# Patient Record
Sex: Female | Born: 1980 | Race: Black or African American | Hispanic: No | Marital: Married | Smoking: Never smoker
Health system: Southern US, Community
[De-identification: ages and names within clinical notes are randomized; demographics above are authoritative.]

## PROBLEM LIST (undated history)

## (undated) DIAGNOSIS — E538 Deficiency of other specified B group vitamins: Secondary | ICD-10-CM

## (undated) DIAGNOSIS — D219 Benign neoplasm of connective and other soft tissue, unspecified: Secondary | ICD-10-CM

## (undated) DIAGNOSIS — Z9889 Other specified postprocedural states: Secondary | ICD-10-CM

## (undated) DIAGNOSIS — I201 Angina pectoris with documented spasm: Secondary | ICD-10-CM

## (undated) DIAGNOSIS — E559 Vitamin D deficiency, unspecified: Secondary | ICD-10-CM

## (undated) DIAGNOSIS — F909 Attention-deficit hyperactivity disorder, unspecified type: Secondary | ICD-10-CM

## (undated) DIAGNOSIS — K589 Irritable bowel syndrome without diarrhea: Secondary | ICD-10-CM

## (undated) DIAGNOSIS — K8020 Calculus of gallbladder without cholecystitis without obstruction: Secondary | ICD-10-CM

## (undated) DIAGNOSIS — Q245 Malformation of coronary vessels: Secondary | ICD-10-CM

## (undated) DIAGNOSIS — E739 Lactose intolerance, unspecified: Secondary | ICD-10-CM

## (undated) DIAGNOSIS — E669 Obesity, unspecified: Secondary | ICD-10-CM

## (undated) DIAGNOSIS — D649 Anemia, unspecified: Secondary | ICD-10-CM

## (undated) DIAGNOSIS — M549 Dorsalgia, unspecified: Secondary | ICD-10-CM

## (undated) DIAGNOSIS — M2550 Pain in unspecified joint: Secondary | ICD-10-CM

## (undated) DIAGNOSIS — R112 Nausea with vomiting, unspecified: Secondary | ICD-10-CM

## (undated) DIAGNOSIS — K635 Polyp of colon: Secondary | ICD-10-CM

## (undated) HISTORY — DX: Pain in unspecified joint: M25.50

## (undated) HISTORY — DX: Anemia, unspecified: D64.9

## (undated) HISTORY — DX: Malformation of coronary vessels: Q24.5

## (undated) HISTORY — DX: Irritable bowel syndrome, unspecified: K58.9

## (undated) HISTORY — DX: Vitamin D deficiency, unspecified: E55.9

## (undated) HISTORY — PX: COLONOSCOPY: SHX174

## (undated) HISTORY — DX: Lactose intolerance, unspecified: E73.9

## (undated) HISTORY — DX: Obesity, unspecified: E66.9

## (undated) HISTORY — DX: Polyp of colon: K63.5

## (undated) HISTORY — PX: COLONOSCOPY W/ POLYPECTOMY: SHX1380

## (undated) HISTORY — PX: LAPAROSCOPIC DECORTICATION / DUBULKING / ABLATION RENAL CYSTS: SUR760

## (undated) HISTORY — DX: Dorsalgia, unspecified: M54.9

## (undated) HISTORY — DX: Deficiency of other specified B group vitamins: E53.8

## (undated) HISTORY — DX: Calculus of gallbladder without cholecystitis without obstruction: K80.20

## (undated) HISTORY — PX: WISDOM TOOTH EXTRACTION: SHX21

---

## 1999-11-03 ENCOUNTER — Encounter: Admission: RE | Admit: 1999-11-03 | Discharge: 2000-02-01 | Payer: Self-pay | Admitting: Family Medicine

## 2000-12-07 ENCOUNTER — Emergency Department (HOSPITAL_COMMUNITY): Admission: EM | Admit: 2000-12-07 | Discharge: 2000-12-08 | Payer: Self-pay | Admitting: Emergency Medicine

## 2001-11-13 HISTORY — PX: COLONOSCOPY: SHX174

## 2001-11-21 ENCOUNTER — Encounter: Payer: Self-pay | Admitting: Gastroenterology

## 2001-11-21 DIAGNOSIS — D126 Benign neoplasm of colon, unspecified: Secondary | ICD-10-CM

## 2002-08-12 ENCOUNTER — Other Ambulatory Visit: Admission: RE | Admit: 2002-08-12 | Discharge: 2002-08-12 | Payer: Self-pay | Admitting: Family Medicine

## 2003-01-03 ENCOUNTER — Emergency Department (HOSPITAL_COMMUNITY): Admission: EM | Admit: 2003-01-03 | Discharge: 2003-01-03 | Payer: Self-pay | Admitting: Emergency Medicine

## 2004-08-24 ENCOUNTER — Ambulatory Visit: Payer: Self-pay | Admitting: Family Medicine

## 2004-09-01 ENCOUNTER — Ambulatory Visit: Payer: Self-pay | Admitting: Gastroenterology

## 2004-09-14 ENCOUNTER — Ambulatory Visit: Payer: Self-pay | Admitting: Gastroenterology

## 2004-09-14 LAB — HM COLONOSCOPY: HM Colonoscopy: NORMAL

## 2004-09-15 HISTORY — PX: COLONOSCOPY: SHX174

## 2004-10-08 ENCOUNTER — Ambulatory Visit: Payer: Self-pay | Admitting: Family Medicine

## 2004-10-08 ENCOUNTER — Other Ambulatory Visit: Admission: RE | Admit: 2004-10-08 | Discharge: 2004-10-08 | Payer: Self-pay | Admitting: Family Medicine

## 2004-10-29 ENCOUNTER — Ambulatory Visit: Payer: Self-pay | Admitting: Family Medicine

## 2005-03-29 ENCOUNTER — Ambulatory Visit: Payer: Self-pay | Admitting: Family Medicine

## 2005-05-03 ENCOUNTER — Ambulatory Visit: Payer: Self-pay | Admitting: Family Medicine

## 2005-05-24 ENCOUNTER — Emergency Department (HOSPITAL_COMMUNITY): Admission: EM | Admit: 2005-05-24 | Discharge: 2005-05-24 | Payer: Self-pay | Admitting: Emergency Medicine

## 2005-09-15 ENCOUNTER — Encounter: Payer: Self-pay | Admitting: Family Medicine

## 2005-09-15 HISTORY — PX: OTHER SURGICAL HISTORY: SHX169

## 2005-09-15 LAB — CONVERTED CEMR LAB

## 2005-10-03 ENCOUNTER — Ambulatory Visit: Payer: Self-pay | Admitting: Internal Medicine

## 2005-10-04 ENCOUNTER — Emergency Department (HOSPITAL_COMMUNITY): Admission: EM | Admit: 2005-10-04 | Discharge: 2005-10-05 | Payer: Self-pay | Admitting: Emergency Medicine

## 2005-10-05 ENCOUNTER — Emergency Department: Payer: Self-pay | Admitting: Emergency Medicine

## 2005-10-05 ENCOUNTER — Ambulatory Visit: Payer: Self-pay | Admitting: Family Medicine

## 2005-10-14 ENCOUNTER — Other Ambulatory Visit: Admission: RE | Admit: 2005-10-14 | Discharge: 2005-10-14 | Payer: Self-pay | Admitting: Addiction Medicine

## 2005-12-23 ENCOUNTER — Ambulatory Visit: Payer: Self-pay | Admitting: Family Medicine

## 2006-04-01 ENCOUNTER — Ambulatory Visit: Payer: Self-pay | Admitting: Family Medicine

## 2006-04-03 ENCOUNTER — Ambulatory Visit: Payer: Self-pay | Admitting: Family Medicine

## 2006-06-23 ENCOUNTER — Ambulatory Visit: Payer: Self-pay | Admitting: Family Medicine

## 2006-08-28 ENCOUNTER — Ambulatory Visit: Payer: Self-pay | Admitting: Family Medicine

## 2006-10-02 ENCOUNTER — Ambulatory Visit: Payer: Self-pay | Admitting: Internal Medicine

## 2006-10-17 ENCOUNTER — Other Ambulatory Visit: Admission: RE | Admit: 2006-10-17 | Discharge: 2006-10-17 | Payer: Self-pay | Admitting: Obstetrics and Gynecology

## 2006-10-24 ENCOUNTER — Ambulatory Visit: Payer: Self-pay | Admitting: Family Medicine

## 2006-11-20 ENCOUNTER — Ambulatory Visit: Payer: Self-pay | Admitting: Family Medicine

## 2006-11-29 ENCOUNTER — Ambulatory Visit: Payer: Self-pay | Admitting: Family Medicine

## 2006-12-06 ENCOUNTER — Ambulatory Visit: Payer: Self-pay | Admitting: Family Medicine

## 2006-12-10 ENCOUNTER — Emergency Department (HOSPITAL_COMMUNITY): Admission: EM | Admit: 2006-12-10 | Discharge: 2006-12-10 | Payer: Self-pay | Admitting: Emergency Medicine

## 2006-12-20 ENCOUNTER — Telehealth (INDEPENDENT_AMBULATORY_CARE_PROVIDER_SITE_OTHER): Payer: Self-pay | Admitting: *Deleted

## 2007-02-20 ENCOUNTER — Encounter: Payer: Self-pay | Admitting: Family Medicine

## 2007-02-20 DIAGNOSIS — K589 Irritable bowel syndrome without diarrhea: Secondary | ICD-10-CM

## 2007-02-20 DIAGNOSIS — M1990 Unspecified osteoarthritis, unspecified site: Secondary | ICD-10-CM | POA: Insufficient documentation

## 2007-02-20 DIAGNOSIS — G43909 Migraine, unspecified, not intractable, without status migrainosus: Secondary | ICD-10-CM | POA: Insufficient documentation

## 2007-02-21 ENCOUNTER — Ambulatory Visit: Payer: Self-pay | Admitting: Family Medicine

## 2007-03-26 ENCOUNTER — Ambulatory Visit: Payer: Self-pay | Admitting: Family Medicine

## 2007-04-10 ENCOUNTER — Ambulatory Visit: Payer: Self-pay | Admitting: Family Medicine

## 2007-04-10 LAB — CONVERTED CEMR LAB: KOH Prep: 0

## 2007-04-12 ENCOUNTER — Encounter: Payer: Self-pay | Admitting: Family Medicine

## 2007-05-01 ENCOUNTER — Telehealth (INDEPENDENT_AMBULATORY_CARE_PROVIDER_SITE_OTHER): Payer: Self-pay | Admitting: *Deleted

## 2007-06-22 ENCOUNTER — Ambulatory Visit (HOSPITAL_BASED_OUTPATIENT_CLINIC_OR_DEPARTMENT_OTHER): Admission: RE | Admit: 2007-06-22 | Discharge: 2007-06-22 | Payer: Self-pay | Admitting: Otolaryngology

## 2007-07-01 ENCOUNTER — Ambulatory Visit: Payer: Self-pay | Admitting: Internal Medicine

## 2007-07-30 ENCOUNTER — Encounter: Payer: Self-pay | Admitting: Family Medicine

## 2007-07-30 HISTORY — PX: TONSILLECTOMY: SUR1361

## 2007-08-04 ENCOUNTER — Emergency Department (HOSPITAL_COMMUNITY): Admission: EM | Admit: 2007-08-04 | Discharge: 2007-08-04 | Payer: Self-pay | Admitting: Emergency Medicine

## 2007-08-07 ENCOUNTER — Telehealth: Payer: Self-pay | Admitting: Family Medicine

## 2007-08-17 ENCOUNTER — Telehealth (INDEPENDENT_AMBULATORY_CARE_PROVIDER_SITE_OTHER): Payer: Self-pay | Admitting: *Deleted

## 2007-10-04 ENCOUNTER — Ambulatory Visit: Payer: Self-pay | Admitting: Gastroenterology

## 2007-11-26 ENCOUNTER — Ambulatory Visit: Payer: Self-pay | Admitting: Gastroenterology

## 2007-12-17 ENCOUNTER — Other Ambulatory Visit: Admission: RE | Admit: 2007-12-17 | Discharge: 2007-12-17 | Payer: Self-pay | Admitting: Obstetrics and Gynecology

## 2007-12-29 ENCOUNTER — Emergency Department (HOSPITAL_COMMUNITY): Admission: EM | Admit: 2007-12-29 | Discharge: 2007-12-29 | Payer: Self-pay | Admitting: Emergency Medicine

## 2008-01-07 ENCOUNTER — Emergency Department: Payer: Self-pay | Admitting: Emergency Medicine

## 2008-03-31 ENCOUNTER — Encounter (INDEPENDENT_AMBULATORY_CARE_PROVIDER_SITE_OTHER): Payer: Self-pay | Admitting: Internal Medicine

## 2008-03-31 ENCOUNTER — Ambulatory Visit: Payer: Self-pay | Admitting: Family Medicine

## 2008-03-31 DIAGNOSIS — E6609 Other obesity due to excess calories: Secondary | ICD-10-CM | POA: Insufficient documentation

## 2008-03-31 DIAGNOSIS — H9190 Unspecified hearing loss, unspecified ear: Secondary | ICD-10-CM | POA: Insufficient documentation

## 2008-04-16 ENCOUNTER — Ambulatory Visit: Payer: Self-pay | Admitting: Family Medicine

## 2008-04-26 ENCOUNTER — Emergency Department: Payer: Self-pay | Admitting: Emergency Medicine

## 2008-05-21 ENCOUNTER — Ambulatory Visit: Payer: Self-pay | Admitting: Family Medicine

## 2008-05-21 LAB — CONVERTED CEMR LAB
Bilirubin Urine: NEGATIVE
Ketones, urine, test strip: NEGATIVE
Protein, U semiquant: NEGATIVE
Urobilinogen, UA: 0.2

## 2008-05-22 ENCOUNTER — Encounter: Payer: Self-pay | Admitting: Family Medicine

## 2008-06-06 ENCOUNTER — Ambulatory Visit: Payer: Self-pay | Admitting: Family Medicine

## 2008-06-07 ENCOUNTER — Emergency Department (HOSPITAL_COMMUNITY): Admission: EM | Admit: 2008-06-07 | Discharge: 2008-06-07 | Payer: Self-pay | Admitting: Emergency Medicine

## 2008-06-24 ENCOUNTER — Telehealth: Payer: Self-pay | Admitting: Family Medicine

## 2008-07-09 ENCOUNTER — Ambulatory Visit: Payer: Self-pay | Admitting: Family Medicine

## 2008-07-23 ENCOUNTER — Ambulatory Visit: Payer: Self-pay | Admitting: Family Medicine

## 2008-07-25 LAB — CONVERTED CEMR LAB
Alkaline Phosphatase: 69 units/L (ref 39–117)
Basophils Absolute: 0 10*3/uL (ref 0.0–0.1)
Basophils Relative: 0 % (ref 0.0–3.0)
Bilirubin, Direct: 0.1 mg/dL (ref 0.0–0.3)
Cholesterol: 183 mg/dL (ref 0–200)
GFR calc Af Amer: 95 mL/min
GFR calc non Af Amer: 79 mL/min
Glucose, Bld: 58 mg/dL — ABNORMAL LOW (ref 70–99)
LDL Cholesterol: 140 mg/dL — ABNORMAL HIGH (ref 0–99)
Lymphocytes Relative: 21.2 % (ref 12.0–46.0)
MCHC: 34 g/dL (ref 30.0–36.0)
Neutrophils Relative %: 75.8 % (ref 43.0–77.0)
Potassium: 4.4 meq/L (ref 3.5–5.1)
RBC: 4.84 M/uL (ref 3.87–5.11)
Sodium: 137 meq/L (ref 135–145)
Total CHOL/HDL Ratio: 6.7
VLDL: 15 mg/dL (ref 0–40)
Vitamin B-12: 512 pg/mL (ref 211–911)

## 2008-07-28 ENCOUNTER — Ambulatory Visit: Payer: Self-pay | Admitting: Emergency Medicine

## 2008-07-28 ENCOUNTER — Emergency Department: Payer: Self-pay | Admitting: Emergency Medicine

## 2008-07-28 ENCOUNTER — Encounter: Payer: Self-pay | Admitting: Gastroenterology

## 2008-08-06 ENCOUNTER — Emergency Department: Payer: Self-pay

## 2008-08-06 ENCOUNTER — Telehealth: Payer: Self-pay | Admitting: Family Medicine

## 2008-08-06 ENCOUNTER — Encounter: Payer: Self-pay | Admitting: Gastroenterology

## 2008-08-07 ENCOUNTER — Ambulatory Visit: Payer: Self-pay | Admitting: Family Medicine

## 2008-08-07 ENCOUNTER — Encounter: Payer: Self-pay | Admitting: Family Medicine

## 2008-08-07 ENCOUNTER — Telehealth: Payer: Self-pay | Admitting: Family Medicine

## 2008-08-15 HISTORY — PX: ESOPHAGOGASTRODUODENOSCOPY: SHX1529

## 2008-09-01 ENCOUNTER — Telehealth: Payer: Self-pay | Admitting: Gastroenterology

## 2008-09-01 ENCOUNTER — Ambulatory Visit: Payer: Self-pay | Admitting: Gastroenterology

## 2008-09-02 ENCOUNTER — Ambulatory Visit: Payer: Self-pay | Admitting: Gastroenterology

## 2008-12-17 ENCOUNTER — Ambulatory Visit: Payer: Self-pay | Admitting: Obstetrics and Gynecology

## 2008-12-17 ENCOUNTER — Other Ambulatory Visit: Admission: RE | Admit: 2008-12-17 | Discharge: 2008-12-17 | Payer: Self-pay | Admitting: Obstetrics and Gynecology

## 2008-12-17 ENCOUNTER — Encounter: Payer: Self-pay | Admitting: Obstetrics and Gynecology

## 2009-04-21 ENCOUNTER — Ambulatory Visit: Payer: Self-pay | Admitting: Family Medicine

## 2009-05-11 ENCOUNTER — Ambulatory Visit: Payer: Self-pay | Admitting: Family Medicine

## 2009-06-12 ENCOUNTER — Ambulatory Visit: Payer: Self-pay | Admitting: Family Medicine

## 2009-06-12 DIAGNOSIS — M2140 Flat foot [pes planus] (acquired), unspecified foot: Secondary | ICD-10-CM | POA: Insufficient documentation

## 2009-06-16 ENCOUNTER — Ambulatory Visit: Payer: Self-pay | Admitting: Family Medicine

## 2009-06-25 ENCOUNTER — Ambulatory Visit: Payer: Self-pay | Admitting: Obstetrics and Gynecology

## 2009-08-11 ENCOUNTER — Ambulatory Visit: Payer: Self-pay | Admitting: Obstetrics and Gynecology

## 2009-08-27 ENCOUNTER — Ambulatory Visit: Payer: Self-pay | Admitting: Family Medicine

## 2009-08-27 LAB — CONVERTED CEMR LAB: Rapid Strep: NEGATIVE

## 2009-09-08 ENCOUNTER — Encounter: Payer: Self-pay | Admitting: Family Medicine

## 2009-09-15 ENCOUNTER — Telehealth: Payer: Self-pay | Admitting: Family Medicine

## 2009-10-05 ENCOUNTER — Ambulatory Visit: Payer: Self-pay | Admitting: Internal Medicine

## 2009-10-05 ENCOUNTER — Encounter (INDEPENDENT_AMBULATORY_CARE_PROVIDER_SITE_OTHER): Payer: Self-pay | Admitting: *Deleted

## 2009-10-05 LAB — CONVERTED CEMR LAB: Rapid Strep: NEGATIVE

## 2009-10-14 ENCOUNTER — Encounter: Payer: Self-pay | Admitting: Family Medicine

## 2009-10-14 DIAGNOSIS — K625 Hemorrhage of anus and rectum: Secondary | ICD-10-CM | POA: Insufficient documentation

## 2009-10-14 DIAGNOSIS — K6289 Other specified diseases of anus and rectum: Secondary | ICD-10-CM | POA: Insufficient documentation

## 2009-10-14 DIAGNOSIS — R319 Hematuria, unspecified: Secondary | ICD-10-CM | POA: Insufficient documentation

## 2009-10-14 LAB — CONVERTED CEMR LAB
Bilirubin Urine: NEGATIVE
Glucose, Urine, Semiquant: NEGATIVE
Ketones, urine, test strip: NEGATIVE
RBC / HPF: 0
Urine crystals, microscopic: 0 /hpf
pH: 6

## 2009-10-30 ENCOUNTER — Telehealth: Payer: Self-pay | Admitting: Family Medicine

## 2009-10-30 ENCOUNTER — Emergency Department: Payer: Self-pay | Admitting: Emergency Medicine

## 2009-11-04 ENCOUNTER — Ambulatory Visit: Payer: Self-pay | Admitting: Family Medicine

## 2009-11-04 DIAGNOSIS — R609 Edema, unspecified: Secondary | ICD-10-CM

## 2009-11-04 DIAGNOSIS — F410 Panic disorder [episodic paroxysmal anxiety] without agoraphobia: Secondary | ICD-10-CM

## 2010-01-13 ENCOUNTER — Ambulatory Visit: Payer: Self-pay | Admitting: Obstetrics and Gynecology

## 2010-01-13 ENCOUNTER — Other Ambulatory Visit: Admission: RE | Admit: 2010-01-13 | Discharge: 2010-01-13 | Payer: Self-pay | Admitting: Obstetrics and Gynecology

## 2010-03-29 ENCOUNTER — Ambulatory Visit: Payer: Self-pay | Admitting: Gynecology

## 2010-03-30 ENCOUNTER — Ambulatory Visit (HOSPITAL_COMMUNITY): Admission: RE | Admit: 2010-03-30 | Discharge: 2010-03-30 | Payer: Self-pay | Admitting: Gynecology

## 2010-04-09 ENCOUNTER — Ambulatory Visit: Payer: Self-pay | Admitting: Obstetrics and Gynecology

## 2010-04-14 ENCOUNTER — Ambulatory Visit (HOSPITAL_COMMUNITY): Admission: RE | Admit: 2010-04-14 | Discharge: 2010-04-14 | Payer: Self-pay | Admitting: Specialist

## 2010-04-20 ENCOUNTER — Ambulatory Visit: Payer: Self-pay | Admitting: Obstetrics and Gynecology

## 2010-06-09 ENCOUNTER — Encounter: Payer: Self-pay | Admitting: Family Medicine

## 2010-07-07 ENCOUNTER — Ambulatory Visit: Payer: Self-pay | Admitting: Obstetrics and Gynecology

## 2010-07-14 ENCOUNTER — Ambulatory Visit: Payer: Self-pay | Admitting: Family Medicine

## 2010-07-14 LAB — CONVERTED CEMR LAB: Rapid Strep: NEGATIVE

## 2010-07-22 ENCOUNTER — Ambulatory Visit (HOSPITAL_COMMUNITY)
Admission: RE | Admit: 2010-07-22 | Discharge: 2010-07-22 | Payer: Self-pay | Source: Home / Self Care | Attending: Obstetrics and Gynecology | Admitting: Obstetrics and Gynecology

## 2010-07-22 ENCOUNTER — Ambulatory Visit: Payer: Self-pay | Admitting: Obstetrics and Gynecology

## 2010-08-04 ENCOUNTER — Ambulatory Visit: Payer: Self-pay | Admitting: Obstetrics and Gynecology

## 2010-08-12 ENCOUNTER — Ambulatory Visit
Admission: RE | Admit: 2010-08-12 | Discharge: 2010-08-12 | Payer: Self-pay | Source: Home / Self Care | Attending: Women's Health | Admitting: Women's Health

## 2010-08-16 ENCOUNTER — Ambulatory Visit: Payer: Self-pay | Admitting: Obstetrics and Gynecology

## 2010-08-19 ENCOUNTER — Telehealth: Payer: Self-pay | Admitting: Family Medicine

## 2010-08-30 ENCOUNTER — Ambulatory Visit
Admission: RE | Admit: 2010-08-30 | Discharge: 2010-08-30 | Payer: Self-pay | Source: Home / Self Care | Attending: Obstetrics and Gynecology | Admitting: Obstetrics and Gynecology

## 2010-08-31 ENCOUNTER — Other Ambulatory Visit: Payer: Self-pay | Admitting: Obstetrics and Gynecology

## 2010-08-31 LAB — RUBELLA ANTIBODY, IGM: Rubella: IMMUNE

## 2010-08-31 LAB — CBC
HCT: 44 % (ref 36–46)
Hemoglobin: 12.9 g/dL (ref 12.0–16.0)
Platelets: 323 10*3/uL (ref 150–399)

## 2010-08-31 LAB — RPR: RPR: NONREACTIVE

## 2010-08-31 LAB — ANTIBODY SCREEN: Antibody Screen: NEGATIVE

## 2010-08-31 LAB — GC/CHLAMYDIA PROBE AMP, GENITAL: Chlamydia: NEGATIVE

## 2010-09-14 NOTE — Progress Notes (Signed)
Summary: feeling worse  Phone Note Call from Patient Call back at Home Phone [phone]   Caller: Patient Call For: Judith Part MD Summary of Call: Patient was seen 2 weeks ago for sore throat and earache. She says that nothing has improved, it has gotten worse. Now she says that it is in her chest and has productive cough. She has not had fever. She wants to know if she can get an rx called [facility] [address]. She also said that if she can't get antibiotic she would like for you to write her an rx for an OTC so she can get it with her flex card.  Initial call taken by: Melody Comas,  September 15, 2009 10:58 AM  Follow-up for Phone Call        px written on EMR for call in for zithromax f/u if not imp Follow-up by: Judith Part MD,  September 15, 2009 11:38 AM  Additional Follow-up for Phone Call Additional follow up Details #1::        Rx Called In, patient notified. Additional Follow-up by: Linde Gillis CMA Duncan Dull),  September 15, 2009 12:35 PM    New/Updated Medications: ZITHROMAX Z-PAK 250 MG TABS (AZITHROMYCIN) take by mouth as directed Prescriptions: ZITHROMAX Z-PAK 250 MG TABS (AZITHROMYCIN) take by mouth as directed  #1 pack x 0   Entered and Authorized by:   Judith Part MD   Signed by:   Judith Part MD on 09/15/2009   Method used:   [facility] to ...       [facility]  [address] [id number]       [address]       [place], [place]  [address]       Ph: [phone]       Fax: [phone]   RxID:   [id number]

## 2010-09-14 NOTE — Assessment & Plan Note (Signed)
Summary: stress and anxiety/ alc   Vital Signs:  Patient profile:   30 year old female Height:      66.5 inches Weight:      278.75 pounds BMI:     44.48 Temp:     97.7 degrees F oral Pulse rate:   68 / minute Pulse rhythm:   regular BP sitting:   106 / 70  (left arm) Cuff size:   large  Vitals Entered By: Lewanda Rife LPN (November 04, 2009 3:12 PM) CC: stress and anxiety, hand and legs swelling   History of Present Illness: friday at work -- chest pressure/ dizzy and sob-- pulse went high  hands were swollen seen in ER-- told this was stress and anxiety- all tests were neg-- nl EKG  has never had a panic attack before  some stress- 2 kids at home with a fever-- lot of illness in family  started noticing hands and legs swelling for over a week feet swell a bit  can notice some pitting no new med -- no new vits and no nsaids  no new salty foods  is watching her diet closely  just finished grad school sat  no time to exercise   has not had any more panic attacks at all   no sadness no problems with sleep or appetite not tearful no obtrusive thoughts no SI  Allergies: 1)  Advil 2)  Prevacid 3)  * Yasmine  Past History:  Past Medical History: Last updated: 10/05/2009 Osteoarthritis- knees IBS obesity COLONIC POLYPS  Hx of IBS  Hx of MIGRAINE HEADACHE   Past Surgical History: Last updated: 09/06/2008 Colonoscopy- polyp, neg pathology (11/2001) Colonoscopy- neg (09/2004) Uterine US- large endometrial stripe, small ovarian cyst (09/2005) DUB- saline histogram Tonsillectomy  07/30/07 EGD- normal (1/10)  Family History: Last updated: 09/01/2008 Father:  Mother:  Siblings: brother with hx of ulcer GM (M) colon ca father's side of family- obesity, DM and HTN HTN on mother's side of family   Family History of Colon Cancer: Maternal Grandmother Family History of Ovarian Cancer: Maternal Grandmother Family History of Clotting disorder: Brother Family  History of Diabetes: Paternal Aunt and Grandmother Family History of Heart Disease: Father  Social History: Last updated: 04/21/2009 Marital Status: single--04/2009--married 02/2009 Children: 1 son Occupation: teaches --birth to K, 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  Alcohol Use - no Daily Caffeine Use Illicit Drug Use - no  Risk Factors: Caffeine Use: 1 (03/31/2008) Exercise: no (03/31/2008)  Risk Factors: Smoking Status: never (09/01/2008) Passive Smoke Exposure: no (03/31/2008)  Review of Systems General:  Complains of fatigue; denies loss of appetite, malaise, and sleep disorder. Eyes:  Denies blurring and eye pain. CV:  Complains of swelling of hands and weight gain; denies chest pain or discomfort, palpitations, and shortness of breath with exertion. Resp:  Denies chest discomfort, chest pain with inspiration, shortness of breath, and wheezing. GI:  Denies abdominal pain, bloody stools, and change in bowel habits. MS:  Denies joint pain and joint redness. Derm:  Denies itching, lesion(s), poor wound healing, and rash. Neuro:  Denies numbness and tingling. Psych:  Complains of panic attacks; denies anxiety, depression, sense of great danger, and suicidal thoughts/plans. Endo:  Denies excessive thirst and excessive urination. Heme:  Denies abnormal bruising and bleeding. Allergy:  Denies hives or rash.  Physical Exam  General:  overweight but generally well appearing NAD, pleasant and conversant. Head:  normocephalic, atraumatic, and no abnormalities observed.   no swelling of face or  eyes  Eyes:  vision grossly intact, pupils equal, pupils round, and pupils reactive to light.  no conjunctival pallor, injection or icterus  Mouth:  pharynx pink and moist.   Neck:  supple with full rom and no masses or thyromegally, no JVD or carotid bruit  Chest Wall:  No deformities, masses, or tenderness noted. Lungs:  Normal respiratory effort, chest expands symmetrically. Lungs are  clear to auscultation, no crackles or wheezes. Heart:  normal rate, regular rhythm, no murmur, and no rub. BLE without edema.  Abdomen:  Bowel sounds positive,abdomen soft and non-tender without masses, organomegaly or hernias noted. no renal bruits  Msk:  No deformity or scoliosis noted of thoracic or lumbar spine.   Pulses:  R and L carotid,radial,femoral,dorsalis pedis and posterior tibial pulses are full and equal bilaterally Extremities:  trace ankle edema no pitting in hands or feet - but notable that rings are  tight Neurologic:  sensation intact to light touch, gait normal, and DTRs symmetrical and normal.   Skin:  Intact without suspicious lesions or rashes Cervical Nodes:  No lymphadenopathy noted Axillary Nodes:  No palpable lymphadenopathy Inguinal Nodes:  No significant adenopathy Psych:  normal affect, talkative and pleasant  not at all anxious today good eye contact and comm skills   Impression & Recommendations:  Problem # 1:  EDEMA (ICD-782.3) Assessment New  worse in hands and feet lately despite low salt diet and good water intake  will try spironolactone low dose with caution- watching for hypotension will rev labs from ER when ready as well f/u 1 mo - visit and labs  Her updated medication list for this problem includes:    Spironolactone 25 Mg Tabs (Spironolactone) .Marland Kitchen... 1 by mouth once daily in am  Orders: Prescription Created Electronically [phone])  Problem # 2:  PANIC ATTACK (ICD-300.01) Assessment: New rev symptoms and ER workup - still pending records for review  disc stressors- is doing ok now disc relaxation technique also  if she has more of these will f/u to disc - may need counseling or consid ssri   Complete Medication List: 1)  Imitrex 100 Mg Tabs (Sumatriptan succinate) .... Take by mouth as directed as needed as needed migraine 2)  Mirena 20 Mcg/24hr Iud (Levonorgestrel) .... As directed 3)  Levbid 0.375 Mg Xr12h-tab (Hyoscyamine sulfate)  .Marland Kitchen.. 1 by mouth up to two times a day as needed abdominal cramping/ ibs 4)  Astepro 0.15 % Soln (Azelastine hcl) .... 2 sprays in each nostril two times a day as needed sneeze/ runny nose 5)  Spironolactone 25 Mg Tabs (Spironolactone) .Marland Kitchen.. 1 by mouth once daily in am  Patient Instructions: 1)  continue to watch salt in your diet and drink lots of water  2)  get back to exercise when you can  3)  start sprironolactone 25 mg each am  4)  if you get lightheaded - this could be a sign of low blood pressure- so stop medicine and call me 5)  follow up with me in about a month  Prescriptions: SPIRONOLACTONE 25 MG TABS (SPIRONOLACTONE) 1 by mouth once daily in am  #30 x 11   Entered and Authorized by:   Judith Part MD   Signed by:   Judith Part MD on 11/04/2009   Method used:   [facility]        [id number]       [address]       [place],  [place]  [address]       Ph: [phone]       Fax: [phone]   RxID:   [id number]   Current Allergies (reviewed today): ADVIL PREVACID Aleda Grana

## 2010-09-14 NOTE — Assessment & Plan Note (Signed)
Summary: ear ache, ST/ alc   Vital Signs:  Patient profile:   30 year old female Height:      66.5 inches Weight:      285.25 pounds BMI:     45.51 Temp:     97.5 degrees F oral Pulse rate:   72 / minute Pulse rhythm:   regular BP sitting:   110 / 80  (left arm) Cuff size:   large  Vitals Entered By: Linde Gillis CMA  Dull) (July 14, 2010 3:38 PM) CC: ear ache, sore throat   History of Present Illness: Sx started Monday with ST and R ear pain.  Pain since then, constant.  No FCNAV.  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   Allergies: 1)  Advil 2)  Prevacid 3)  * Yasmine  Review of Systems       See HPI.  Otherwise negative.    Physical Exam  General:  GEN: nad, alert and oriented, obese HEENT: mucous membranes moist, TM w/o erythema bilaterally but R TM with SOM, nasal epithelium injected with dec in caliber of passage noted on R, OP with cobblestoning NECK: supple w/o LA CV: rrr. PULM: ctab, no inc wob ABD: soft, +bs EXT: no edema    Impression & Recommendations:  Problem # 1:  URI (ICD-465.9) R ETD likely explains the pain and ear symptoms.  Likely viral.  supporitve tx.  follow up as needed.  Anatomy d/w patient and she understands.    Complete Medication List: 1)  Imitrex 100 Mg Tabs (Sumatriptan succinate) .... Take by mouth as directed as needed as needed migraine 2)  Levbid 0.375 Mg Xr12h-tab (Hyoscyamine sulfate) .Marland Kitchen.. 1 by mouth up to two times a day as needed abdominal cramping/ ibs 3)  Astepro 0.15 % Soln (Azelastine hcl) .... 2 sprays in each nostril two times a day as needed sneeze/ runny nose 4)  Spironolactone 25 Mg Tabs (Spironolactone) .Marland Kitchen.. 1 by mouth once daily in am  Patient Instructions: 1)  Get plenty of rest, drink lots of clear liquids, and use Tylenol or Ibuprofen for fever and comfort.  Take care.    Orders Added: 1)  Est. Patient Level III [[address]]     Current Allergies (reviewed  today): ADVIL PREVACID Aleda Grana  Laboratory Results    Other Tests  Rapid Strep: negative  Kit Test Internal QC: Positive   (Normal Range: Negative)

## 2010-09-14 NOTE — Letter (Signed)
Summary: Out of Work  [facility]  [address] [place], [place] [address]   Phone: [phone]  Fax: [phone]    July 14, 2010   Employee:  Ethyl D GILLIAM-WILSON    To Whom It May Concern:   For Medical reasons, please excuse the above named employee from work until ear pain and congestion resolved.  Potentially contagious.  If you need additional information, please feel free to contact our office.         Sincerely,    Crawford Givens MD

## 2010-09-14 NOTE — Progress Notes (Signed)
Summary: Chest tighness and SOB  Phone Note Call from Patient Call back at [phone]   Caller: Patient Call For: Judith Part MD Summary of Call: Patient called and stated that she is having chest tightness and shortness of breath, and her hands are swelling.  This just started while she was at work.  Someone at her job took her pulse and she said that her pulse was racing.  I advised patient that she needs to call 9-1-1.  She says that she feels bad but not bad enough to call 9-1-1.  I advised her to have someone take her to the ER right away.  She said that there was someone there with her that could take her to the ER and that she was going to have them take her immediately.  I advised her to call us back and let us know how she is doing after the trip to the ER. Initial call taken by: Linde Gillis CMA Duncan Dull),  October 30, 2009 9:23 AM  Follow-up for Phone Call        Agreed. Follow-up by: Ruthe Mannan MD,  October 30, 2009 9:24 AM

## 2010-09-14 NOTE — Assessment & Plan Note (Signed)
Summary: STREP? /DR TOWERS PT AT [facility]/ OK'D BY LOU/NWS   Vital Signs:  Patient profile:   30 year old female Height:      66.5 inches (168.91 cm) Weight:      281.0 pounds (127.73 kg) O2 Sat:      98 % on Room air Temp:     98.3 degrees F (36.83 degrees C) oral Pulse rate:   70 / minute BP sitting:   112 / 72  (left arm) Cuff size:   large  Vitals Entered By: Orlan Leavens (October 05, 2009 2:49 PM)  O2 Flow:  Room air CC: ? strep throat Is Patient Diabetic? No Pain Assessment Patient in pain? no        Primary Care Provider:  Roxy Manns, MD  CC:  ? strep throat.  History of Present Illness: here today with complaint of ST and ear ache. onset of symptoms was <24h ago (upon awaking this AM). course has been sudden onset and now occurs in progressive pattern. 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 symptoms improved by nothing. symptoms worsened with activity. no prior hx of same symptoms.   Current Medications (verified): 1)  Imitrex 100 Mg  Tabs (Sumatriptan Succinate) .... Take By Mouth As Directed As Needed As Needed Migraine 2)  Mirena 20 Mcg/24hr  Iud (Levonorgestrel) .... As Directed 3)  Levbid 0.375 Mg Xr12h-Tab (Hyoscyamine Sulfate) .Marland Kitchen.. 1 By Mouth Up To Two Times A Day As Needed Abdominal Cramping/ Ibs 4)  Zinc 10 Mg Lozg (Zinc Gluconate) .... Take One Losenge Three Times A Day As Needed At Beginning of A Cold  Allergies (verified): 1)  Advil 2)  Prevacid 3)  * Yasmine  Past History:  Past Medical History: Osteoarthritis- knees IBS obesity COLONIC POLYPS  Hx of IBS  Hx of MIGRAINE HEADACHE   Review of Systems  The patient denies fever, vision loss, decreased hearing, hoarseness, chest pain, dyspnea on exertion, and peripheral edema.    Physical Exam  General:  overweight but  generally well appearing NAD, pleasant and conversant. Eyes:  vision grossly intact; pupils equal, round and reactive to light.  conjunctiva and lids normal.    Ears:  normal pinnae bilaterally, without erythema, swelling, or tenderness to palpation. TMs clear, without effusion, or cerumen impaction. Hearing grossly normal bilaterally  Mouth:  teeth and gums in good repair; mucous membranes moist, without lesions or ulcers. oropharynx clear without exudate, min erythema. no PND Lungs:  normal respiratory effort, no intercostal retractions or use of accessory muscles; normal breath sounds bilaterally - no crackles and no wheezes.    Heart:  normal rate, regular rhythm, no murmur, and no rub. BLE without edema.    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 Take by mouth as directed Her updated medication list for this problem includes:    Azithromycin 250 Mg Tabs (Azithromycin) .Marland Kitchen... 2 tabs by mouth today, then 1 by mouth daily starting tomorrow  Problem # 2:  STREPTOCOCCAL PHARYNGITIS (ICD-034.0)  The following medications were removed from the medication list:    Zithromax Z-pak 250 Mg Tabs (Azithromycin) .Marland Kitchen... Take by mouth as directed Her updated medication list for this problem includes:    Azithromycin 250 Mg Tabs (Azithromycin) .Marland Kitchen... 2 tabs by mouth today, then 1 by mouth daily starting tomorrow  Orders: Rapid Strep ([address])  Complete Medication List: 1)  Imitrex 100 Mg Tabs (Sumatriptan succinate) .... Take by mouth as directed as needed as needed migraine 2)  Mirena 20 Mcg/24hr Iud (Levonorgestrel) .... As directed 3)  Levbid 0.375 Mg  Xr12h-tab (Hyoscyamine sulfate) .Marland Kitchen.. 1 by mouth up to two times a day as needed abdominal cramping/ ibs 4)  Zinc 10 Mg Lozg (Zinc gluconate) .... Take one losenge three times a day as needed at beginning of a cold 5)  Azithromycin 250 Mg Tabs (Azithromycin) .... 2 tabs by mouth today, then 1 by mouth daily starting tomorrow 6)  Tylenol Cold/flu Severe Day 60-1000-30 Mg/70ml Liqd (Pseudoephedrine-apap-dm) .... As direceted on box - use as needed 7)  Mucinex 600 Mg Xr12h-tab (Guaifenesin) .Marland Kitchen.. 1 by mouth every 12h as needed for cough  Patient Instructions: 1)  it was good to see you today. 2)  rapid strep today-- NEGATIVE 3)  if you develop worseing symptoms or fever, call us and we can recosider antibiotics but it does not appear necessary to use any anitbiotic at this time  (Zpack prescription provided to use if you develop fever, sputum or worsening symptoms) 4)  Recommended remaining out of work for  next 24h - may return Wednesday Prescriptions: MUCINEX 600 MG XR12H-TAB (GUAIFENESIN) 1 by mouth every 12h as needed for cough  #1 box x 0   Entered and Authorized by:   Newt Lukes MD   Signed by:   Newt Lukes MD on 10/05/2009   Method used:   Print then Give to Patient   RxID:   [id number] TYLENOL COLD/FLU SEVERE DAY 60-1000-30 MG/30ML LIQD (PSEUDOEPHEDRINE-APAP-DM) as direceted on box - use as needed  #1 box x 0   Entered and Authorized by:   Newt Lukes MD   Signed by:   Newt Lukes MD on 10/05/2009   Method used:   Print then Give to Patient   RxID:   [id number] AZITHROMYCIN 250 MG TABS (AZITHROMYCIN) 2 tabs by mouth today, then 1 by mouth daily starting tomorrow  #6 x 0   Entered and Authorized by:   Newt Lukes MD   Signed by:   Newt Lukes MD on 10/05/2009   Method used:   Print then Give to Patient   RxID:   [id number]   Laboratory Results    Other Tests  Rapid Strep: negative

## 2010-09-14 NOTE — Assessment & Plan Note (Signed)
Summary: BLOOD IN STOOL/CLE   Vital Signs:  Patient profile:   30 year old female Height:      66.5 inches Weight:      281.25 pounds BMI:     44.88 Temp:     98.1 degrees F oral Pulse rate:   92 / minute Pulse rhythm:   regular BP sitting:   122 / 78  (left arm) Cuff size:   large  Vitals Entered By: Delilah Shan CMA Duncan Dull) (October 14, 2009 12:27 PM) CC: Blood in stool and urine   History of Present Illness: some blood in stool since sunday  and yesterday thinks she had some in her urine -- was red streak   has hx of constipation and hemorroid - but not lately  no abdominal pain   not on menses -- is in mid cycle   has a knot on her neck --since she got sick -- around/ behind her R ear   has been sick with uri  going on for over 2 weeks  still sneezing and coughing and lost her voice monday  no more sore throat  did try zyrtec  no headache or sinus pain   saw Dr Felicity Coyer and took zpack for presumed pharyngitis   no aspirin or advil or aleve    no dysuria , or frequency  some urgency  has mirina iud  back has hurt a little     Allergies: 1)  Advil 2)  Prevacid 3)  * Yasmine  Past History:  Past Medical History: Last updated: 10/05/2009 Osteoarthritis- knees IBS obesity COLONIC POLYPS  Hx of IBS  Hx of MIGRAINE HEADACHE   Past Surgical History: Last updated: 09/06/2008 Colonoscopy- polyp, neg pathology (11/2001) Colonoscopy- neg (09/2004) Uterine US- large endometrial stripe, small ovarian cyst (09/2005) DUB- saline histogram Tonsillectomy  07/30/07 EGD- normal (1/10)  Family History: Last updated: 09/01/2008 Father:  Mother:  Siblings: brother with hx of ulcer GM (M) colon ca father's side of family- obesity, DM and HTN HTN on mother's side of family   Family History of Colon Cancer: Maternal Grandmother Family History of Ovarian Cancer: Maternal Grandmother Family History of Clotting disorder: Brother Family History of Diabetes:  Paternal Aunt and Grandmother Family History of Heart Disease: Father  Social History: Last updated: 04/21/2009 Marital Status: single--04/2009--married 02/2009 Children: 1 son Occupation: teaches --birth to K, 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  Alcohol Use - no Daily Caffeine Use Illicit Drug Use - no  Risk Factors: Caffeine Use: 1 (03/31/2008) Exercise: no (03/31/2008)  Risk Factors: Smoking Status: never (09/01/2008) Passive Smoke Exposure: no (03/31/2008)  Review of Systems General:  Complains of fatigue and malaise; denies chills and fever. Eyes:  Denies discharge and eye irritation. ENT:  Complains of hoarseness, nasal congestion, and postnasal drainage. CV:  Denies chest pain or discomfort and palpitations. Resp:  Complains of cough and sputum productive; denies shortness of breath and wheezing. GI:  Complains of bloody stools; denies diarrhea, nausea, and vomiting. GU:  Complains of abnormal vaginal bleeding; denies dysuria and urinary frequency. MS:  Denies joint pain. Derm:  Denies itching, lesion(s), poor wound healing, and rash. Neuro:  Denies headaches, numbness, and tingling. Psych:  mood is ok . Heme:  Denies abnormal bruising, enlarge lymph nodes, pallor, and skin discoloration.  Physical Exam  General:  overweight but generally well appearing NAD, pleasant and conversant. Head:  normocephalic, atraumatic, and no abnormalities observed.  no sinus tenderness  Eyes:  vision grossly intact, pupils  equal, pupils round, and pupils reactive to light.  no conj pallor  mild conj inj with tearing - no swelling  Ears:  R ear normal and L ear normal.   Nose:  nares are congested with clear rhinorrhea  Mouth:  pharynx pink and moist, no erythema, and no exudates.   Neck:  some tenderness behind R ear - but no fullness or LN appreciated and no skin change supple/ no thyromegally Chest Wall:  No deformities, masses, or tenderness noted. Lungs:  Normal respiratory  effort, chest expands symmetrically. Lungs are clear to auscultation, no crackles or wheezes. Heart:  normal rate, regular rhythm, no murmur, and no rub. BLE without edema.  Abdomen:  Bowel sounds positive,abdomen soft and non-tender without masses, organomegaly or hernias noted. no suprapubic tenderness or fullness felt  Rectal:  very small non bleeding fissue seen at 7:00 on anoscopy - with minimal discomfort  nl tone  heme neg stool  Genitalia:  normal introitus and no external lesions.   Msk:  nl rom LS  no CVA tenderness  Extremities:  No clubbing, cyanosis, edema, or deformity noted with normal full range of motion of all joints.   Neurologic:  sensation intact to light touch, gait normal, and DTRs symmetrical and normal.   Skin:  Intact without suspicious lesions or rashes no pallor or jaundice  Cervical Nodes:  No lymphadenopathy noted Inguinal Nodes:  No significant adenopathy Psych:  normal affect, talkative and pleasant    Impression & Recommendations:  Problem # 1:  URI (ICD-465.9) Assessment Unchanged ongoing with rhinorrhea (also disc pos of new allergies) already tx for pharyngitis with zithromax  recommend claritin otc 10 mg daily samples of astepro ns with inst  if worse or not imp in 1-2 wk will update (or if facial pain or fever)  The following medications were removed from the medication list:    Tylenol Cold/flu Severe Day 60-1000-30 Mg/36ml Liqd (Pseudoephedrine-apap-dm) .Marland Kitchen... As direceted on box - use as needed    Mucinex 600 Mg Xr12h-tab (Guaifenesin) .Marland Kitchen... 1 by mouth every 12h as needed for cough  Problem # 2:  RECTAL BLEEDING (ICD-569.3) Assessment: New with mild pain- one episode  very small fissue seen on anoscopy today- not very tender disc imp of keeping stools soft and how to do that  if not imp will ref to GI   Problem # 3:  HEMATURIA UNSPECIFIED (ICD-599.70) Assessment: New 1 episode without other urinary symptoms  nl ua today I suspect this  was actualy a bit of vaginal spotting from iud -- but if it re-occurs enc her to contact me or her gyn  Complete Medication List: 1)  Imitrex 100 Mg Tabs (Sumatriptan succinate) .... Take by mouth as directed as needed as needed migraine 2)  Mirena 20 Mcg/24hr Iud (Levonorgestrel) .... As directed 3)  Levbid 0.375 Mg Xr12h-tab (Hyoscyamine sulfate) .Marland Kitchen.. 1 by mouth up to two times a day as needed abdominal cramping/ ibs 4)  Astepro 0.15 % Soln (Azelastine hcl) .... 2 sprays in each nostril two times a day as needed sneeze/ runny nose  Patient Instructions: 1)  your urine is clear today- I suspect you had some vaginal spotting from the iud -- but let me know if it returns or if any pain to urinate or burning  2)  you have a small irritated area in rectum -- so I want to have you avoid straining and keep stools soft with either a stool softener (like colace) or lots of  fiber and fluids  3)  if rectal bleeding returns or worsens please update me  4)  for coughing and sneezing - try claritin otc 10mg  daily and also the samples of astepro nasal spray (2 squirts in each nostril two times a day ) until your symptoms improve  5)  if worse or fever or not improved in 1 week please let me know   Current Allergies (reviewed today): ADVIL PREVACID Aleda Grana  Laboratory Results   Urine Tests   Date/Time Reported: October 14, 2009 12:38 PM   Routine Urinalysis   Color: yellow Appearance: Hazy Glucose: negative   (Normal Range: Negative) Bilirubin: negative   (Normal Range: Negative) Ketone: negative   (Normal Range: Negative) Spec. Gravity: 1.025   (Normal Range: 1.003-1.035) Blood: trace-intact   (Normal Range: Negative) pH: 6.0   (Normal Range: 5.0-8.0) Protein: negative   (Normal Range: Negative) Urobilinogen: 0.2   (Normal Range: 0-1) Nitrite: negative   (Normal Range: Negative) Leukocyte Esterace: negative   (Normal Range: Negative)  Urine Microscopic WBC/HPF: 0-1 RBC/HPF:  0 Bacteria/HPF: 0 Mucous/HPF: few Epithelial/HPF: 2-4 Crystals/HPF: 0 Casts/LPF: 0 Yeast/HPF: 0 Other: 0       Appended Document: Orders Update     Clinical Lists Changes  Orders: Added new Service order of UA Dipstick W/ Micro (manual) ([address]) - Signed

## 2010-09-14 NOTE — Assessment & Plan Note (Signed)
Summary: EAR PAIN AND SORE THROAT  CYD   Vital Signs:  Patient profile:   30 year old female Weight:      281 pounds Temp:     97.9 degrees F oral Pulse rate:   72 / minute Pulse rhythm:   regular BP sitting:   106 / 72  (left arm) Cuff size:   large  Vitals Entered By: Lowella Petties CMA (August 27, 2009 9:46 AM) CC: Both ears hurting, sore throat.   History of Present Illness: throat and ears are hurting since this am  not a lot of nasal congestion -- and not a lot of drip  R ear hurts more than her L   no fever -- no chills or aches   woke up coughing some -- dry and non productive   no meds otc   Allergies: 1)  Advil 2)  Prevacid 3)  * Yasmine  Past History:  Past Medical History: Last updated: 08/28/2008 Osteoarthritis- knees IBS obesity Current Problems:  COLONIC POLYPS (ICD-211.3) RUQ PAIN (ICD-789.01) FATIGUE (ICD-780.79) WEIGHT GAIN (ICD-783.1) HEARING LOSS, MILD (ICD-389.9) OBESITY (ICD-278.00) OTH GENERAL MEDICAL EXAMINATION ADMIN PURPOSES (ICD-V70.3) Hx of IBS (ICD-564.1) Hx of MIGRAINE HEADACHE (ICD-346.90) OSTEOARTHRITIS (ICD-715.90)  Past Surgical History: Last updated: 09/06/2008 Colonoscopy- polyp, neg pathology (11/2001) Colonoscopy- neg (09/2004) Uterine US- large endometrial stripe, small ovarian cyst (09/2005) DUB- saline histogram Tonsillectomy  07/30/07 EGD- normal (1/10)  Family History: Last updated: 09/01/2008 Father:  Mother:  Siblings: brother with hx of ulcer GM (M) colon ca father's side of family- obesity, DM and HTN HTN on mother's side of family   Family History of Colon Cancer: Maternal Grandmother Family History of Ovarian Cancer: Maternal Grandmother Family History of Clotting disorder: Brother Family History of Diabetes: Paternal Aunt and Grandmother Family History of Heart Disease: Father  Social History: Last updated: 04/21/2009 Marital Status: single--04/2009--married 02/2009 Children: 1  son Occupation: teaches --birth to K, 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  Alcohol Use - no Daily Caffeine Use Illicit Drug Use - no  Risk Factors: Caffeine Use: 1 (03/31/2008) Exercise: no (03/31/2008)  Risk Factors: Smoking Status: never (09/01/2008) Passive Smoke Exposure: no (03/31/2008)  Review of Systems General:  Complains of fatigue. Eyes:  Denies blurring, discharge, eye irritation, and eye pain. ENT:  Complains of earache, hoarseness, and sore throat; denies ear discharge, nasal congestion, and sinus pressure. CV:  Denies chest pain or discomfort and palpitations. Resp:  Complains of cough; denies sputum productive and wheezing. GI:  Denies abdominal pain, diarrhea, nausea, and vomiting. Derm:  Denies rash.  Physical Exam  Mouth:  pharynx pink and moist, no erythema, and no exudates.  some clear post nasal drip   Impression & Recommendations:  Problem # 1:  URI (ICD-465.9) Assessment New  with sore throat and neg rapid strep recommend sympt care- see pt instructions   can try zinc losenges as needed   Orders: Rapid Strep (25427)  Complete Medication List: 1)  Imitrex 100 Mg Tabs (Sumatriptan succinate) .... Take by mouth as directed as needed as needed migraine 2)  Mirena 20 Mcg/24hr Iud (Levonorgestrel) .... As directed 3)  Levbid 0.375 Mg Xr12h-tab (Hyoscyamine sulfate) .Marland Kitchen.. 1 by mouth up to two times a day as needed abdominal cramping/ ibs 4)  Zinc 10 Mg Lozg (Zinc gluconate) .... Take one losenge three times a day as needed at beginning of a cold  Patient Instructions: 1)  I think you have viral infection- it may turn into a cold 2)  recommend tylenol otc for fever or sore throat  3)  also choloraseptic throat spray and salt water gargles 4)  update me if increased sore throat or high fever or if not improving in a week Prescriptions: ZINC 10 MG LOZG (ZINC GLUCONATE) take one losenge three times a day as needed at beginning of a cold  #30 x 1    Entered and Authorized by:   Judith Part MD   Signed by:   Judith Part MD on 08/27/2009   Method used:   Print then Give to Patient   RxID:   [id number] IMITREX 100 MG  TABS (SUMATRIPTAN SUCCINATE) take by mouth as directed as needed as needed migraine  #9 x 3   Entered and Authorized by:   Judith Part MD   Signed by:   Judith Part MD on 08/27/2009   Method used:   Print then Give to Patient   RxID:   [id number]   Prior Medications (reviewed today): MIRENA 20 MCG/24HR  IUD (LEVONORGESTREL) as directed LEVBID 0.375 MG XR12H-TAB (HYOSCYAMINE SULFATE) 1 by mouth up to two times a day as needed abdominal cramping/ IBS Current Allergies: ADVIL PREVACID * YASMINE    Laboratory Results    Other Tests  Rapid Strep: negative

## 2010-09-14 NOTE — Letter (Signed)
Summary: Out of Work  [facility]  [address] [place], [place] [address]   Phone: [phone]  Fax: [phone]    October 05, 2009   Employee:  Laylamarie D GILLIAM-WILSON    To Whom It May Concern:   For Medical reasons, please excuse the above named employee from work for the following dates:  Start: 10/06/09    End: 10/07/09, can return to work on Wednesday    If you need additional information, please feel free to contact our office.         Sincerely,    Dr. Rene Paci

## 2010-09-14 NOTE — Letter (Signed)
Summary: Letter Regarding Disease Mgmt Program/[facility]  Letter Regarding Disease Mgmt Program/[facility]   Imported By: Lanelle Bal 06/16/2010 10:38:45  _____________________________________________________________________  External Attachment:    Type:   Image     Comment:   External Document

## 2010-09-14 NOTE — Letter (Signed)
Summary: [facility]  [facility]   Imported By: Maryln Gottron 09/21/2009 15:32:16  _____________________________________________________________________  External Attachment:    Type:   Image     Comment:   External Document

## 2010-09-16 NOTE — Progress Notes (Signed)
Summary: cold symptoms  Phone Note Call from Patient Call back at Home Phone [phone]   Caller: Patient Call For: Judith Part MD Summary of Call: Patient says that she has cold symptoms. Has sore throat, dry cough. She recently found out that she was pregnant and wants to know what is safe to take OTC for these symptoms.  Initial call taken by: Melody Comas,  August 19, 2010 3:55 PM  Follow-up for Phone Call        pretty much has to stay with tylenol for fever/pain/ sore throat drink lots of fluids nasal saline spray for congestion  f/u with me or gyn if not improving  Follow-up by: Judith Part MD,  August 19, 2010 3:59 PM  Additional Follow-up for Phone Call Additional follow up Details #1::        Left message for patient to call back. Lewanda Rife LPN  August 19, 2010 4:18 PM   Patient notified as instructed by telephone. Lewanda Rife LPN  August 19, 2010 4:25 PM

## 2010-09-30 ENCOUNTER — Ambulatory Visit (INDEPENDENT_AMBULATORY_CARE_PROVIDER_SITE_OTHER): Payer: BC Managed Care – PPO | Admitting: Family Medicine

## 2010-09-30 ENCOUNTER — Encounter: Payer: Self-pay | Admitting: Family Medicine

## 2010-09-30 DIAGNOSIS — R1110 Vomiting, unspecified: Secondary | ICD-10-CM

## 2010-09-30 DIAGNOSIS — Z331 Pregnant state, incidental: Secondary | ICD-10-CM

## 2010-10-06 NOTE — Letter (Signed)
Summary: Out of Work  [facility]  [address] [place], [place] [address]   Phone: [phone]  Fax: [phone]    September 30, 2010   Employee:  Brittney [name]    To Whom It May Concern:   For Medical reasons, please excuse the above named employee from work for the following dates:  Start:  September 30, 2010   End:  October 01, 2010   If you need additional information, please feel free to contact our office.         Sincerely,    Eustaquio Boyden  MD

## 2010-10-06 NOTE — Assessment & Plan Note (Signed)
Summary: VOMITING/HEADACHE/FEVER//[facility]   Vital Signs:  Patient profile:   30 year old female Weight:      269.75 pounds Temp:     98.3 degrees F oral Pulse rate:   80 / minute Pulse rhythm:   regular BP sitting:   126 / 80  (left arm) Cuff size:   large  Vitals Entered By: Selena Batten Dance CMA Duncan Dull) (September 30, 2010 4:26 PM) CC: Vomitting,headache,fever Comments **Patient is [redacted] weeks pregnant   History of Present Illness: CC: vomiting, HA, fever, feels dehydrated  1d h/o n/v (food, nonbilious), states red blood came up as well but also had red vegetable soup.  Also with temperature to 100.2.  Also with HA that hurt all over achey, h/o migraines but this was different.  No photo/phonophobia.  No neck pain/stiffness.  No abd pain.  No diarrhea.    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    Not eating or drinking much.  Today stayed home from school, vomited in am and slept rest of day.  No more blood, just foam.  [redacted] wks pregnant.  Has had morning sickness which was different from last night.    Current Medications (verified): 1)  Mynatal Plus  Tabs (Prenatal Vit-Fe Fumarate-Fa) .... 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  Alcohol Use - no Daily Caffeine Use Illicit Drug Use - no  Review of Systems       per HPI  Physical Exam  General:  Well-developed,well-nourished,in no acute distress; alert,appropriate and cooperative throughout examination.  nontoxic Head:  normocephalic, atraumatic, and no abnormalities observed.   Eyes:  vision grossly intact, pupils equal, pupils  round, and pupils reactive to light.  no conjunctival pallor, injection or icterus  Mouth:  pharynx pink and moist.  no pharyngeal erythema Lungs:  Normal respiratory effort, chest expands symmetrically. Lungs are clear to auscultation, no crackles or wheezes. Heart:  normal rate, regular rhythm, no murmur, and no rub. BLE without edema.  Abdomen:  Bowel sounds positive,abdomen soft and non-tender without masses, organomegaly or hernias noted. no renal bruits  Pulses:  2+ rad pulses, no pedal edema   Impression & Recommendations:  Problem # 1:  VOMITING (ICD-787.03) in [redacted] wk pregnant.  in setting of recent exposure to student with vomiting, and setting of viral gastro going around.  Likely viral gastroenteritis.  pt would like antiemetic that she doesn't need to swallow, sent in zofran odt.  advised may be too expensive but to see if can fill.  push small sips, recommended if feeling getting dehydrated to go to [facility] for IV rehydration as we don't have that available here.  Advised to update [place] if vomiting continuing.  nontoxic on exam, not dehydrated.  anticipate red emesis likely from food not blood as on rpt emesis no more blood.  Complete Medication List: 1)  Mynatal Plus Tabs (Prenatal vit-fe fumarate-fa) .... Gummy chewable prenatal 2)  Zofran Odt 4 Mg Tbdp (Ondansetron) .... Take one by mouth q6 hours as needed nausea  Patient Instructions: 1)  I think you have bit of stomach flu. 2)  Push small sips of fluids  throughout day to prevent dehydration 3)  Return if fever >101.5 or not improving as expected.  I would expect you to be feeling better by tomorrow afternoon. 4)  Update [place] if worsening. 5)  Good to see you today, I hope you start feeling better. Prescriptions: ZOFRAN ODT 4 MG TBDP (ONDANSETRON) take one by mouth q6 hours as needed nausea  #20 x 0   Entered and Authorized by:   Eustaquio Boyden  MD   Signed by:   Eustaquio Boyden  MD on 09/30/2010   Method used:    Electronically to        [id number]       [address]       [place], [place]  [address]       Ph: [phone]       Fax: [phone]   RxID:   [id number]    Orders Added: 1)  Est. Patient Level III [[address]]    Prior Medications: Current Allergies (reviewed today): PREVACID Aleda Grana

## 2010-10-26 ENCOUNTER — Ambulatory Visit: Payer: BC Managed Care – PPO

## 2010-11-24 ENCOUNTER — Other Ambulatory Visit (HOSPITAL_COMMUNITY): Payer: Self-pay | Admitting: Obstetrics & Gynecology

## 2010-11-24 DIAGNOSIS — Z3689 Encounter for other specified antenatal screening: Secondary | ICD-10-CM

## 2010-11-26 ENCOUNTER — Ambulatory Visit (HOSPITAL_COMMUNITY)
Admission: RE | Admit: 2010-11-26 | Discharge: 2010-11-26 | Disposition: A | Discharge status: Home / Self Care | Payer: BC Managed Care – PPO | Source: Ambulatory Visit | Attending: Obstetrics & Gynecology | Admitting: Obstetrics & Gynecology

## 2010-11-26 DIAGNOSIS — Z363 Encounter for antenatal screening for malformations: Secondary | ICD-10-CM | POA: Insufficient documentation

## 2010-11-26 DIAGNOSIS — E669 Obesity, unspecified: Secondary | ICD-10-CM | POA: Insufficient documentation

## 2010-11-26 DIAGNOSIS — Z3689 Encounter for other specified antenatal screening: Secondary | ICD-10-CM

## 2010-11-26 DIAGNOSIS — Z1389 Encounter for screening for other disorder: Secondary | ICD-10-CM | POA: Insufficient documentation

## 2010-11-26 DIAGNOSIS — O358XX0 Maternal care for other (suspected) fetal abnormality and damage, not applicable or unspecified: Secondary | ICD-10-CM | POA: Insufficient documentation

## 2010-12-20 ENCOUNTER — Encounter: Payer: Self-pay | Admitting: Family Medicine

## 2010-12-20 ENCOUNTER — Ambulatory Visit (INDEPENDENT_AMBULATORY_CARE_PROVIDER_SITE_OTHER): Payer: BC Managed Care – PPO | Admitting: Family Medicine

## 2010-12-20 VITALS — BP 110/70 | HR 84 | Temp 98.9°F | Ht 68.0 in | Wt 277.0 lb

## 2010-12-20 DIAGNOSIS — R300 Dysuria: Secondary | ICD-10-CM

## 2010-12-20 DIAGNOSIS — N898 Other specified noninflammatory disorders of vagina: Secondary | ICD-10-CM | POA: Insufficient documentation

## 2010-12-20 LAB — POCT URINALYSIS DIPSTICK
Glucose, UA: NEGATIVE
Nitrite, UA: NEGATIVE
Protein, UA: 30
Urobilinogen, UA: 1

## 2010-12-20 LAB — POCT WET PREP (WET MOUNT): Trichomonas Wet Prep HPF POC: 0

## 2010-12-20 MED ORDER — FLUCONAZOLE 150 MG PO TABS: 150.0000 mg | ORAL_TABLET | Freq: Once | ORAL | Status: AC

## 2010-12-20 NOTE — Assessment & Plan Note (Signed)
Wet prep consistent with yeast infection. In second trimester, treat with diflucan x 1. Update [place] if not improved.

## 2010-12-20 NOTE — Progress Notes (Signed)
  Subjective:    Patient ID: Brittney [name], female    DOB: September 27, 1980, 30 y.o.   MRN: [id number]  HPI CC: ? Infection  1 day history of some dysuria, discharge, different smell.  Discharge white cottage cheese.  Last vag infection was several months ago, yeast.  Burning on inside when voids.  Hasn't tried anything OTC.  No urgency, no frequency.  No abd pain, back pain, f/c/n/v.    5 mo pregnant.  Girl.  Excited.  [facility] - [facility].  Has appt thursday.  Review of Systems Per HPI    Objective:   Physical Exam  [nursing notereviewed. Constitutional: She appears well-developed and well-nourished. No distress.  HENT:  Mouth/Throat: Oropharynx is clear and moist. No oropharyngeal exudate.  Cardiovascular: Normal rate, regular rhythm, normal heart sounds and intact distal pulses.   No murmur heard. Pulmonary/Chest: Effort normal and breath sounds normal. No respiratory distress. She has no wheezes. She has no rales.  Abdominal: Soft. Bowel sounds are normal. She exhibits no distension. There is no tenderness. There is no rebound.       No CVA tenderness.  + mild suprapubic discomfort  Genitourinary: No erythema, tenderness or bleeding around the vagina. Vaginal discharge (thick white discharge) found.          Assessment & Plan:

## 2010-12-20 NOTE — Assessment & Plan Note (Signed)
Initial UA contaminated with epi - reobtained after discussion of clean catch technique and sent for culture. Treat yeast infection, see if all sxs resolve.  If not, will treat possible UTI.   Will fax OV to OBGYN as fyi.

## 2010-12-20 NOTE — Patient Instructions (Signed)
Treat definite yeast infection with diflucan x1. For possible UTI - push fluids and may use tylenol for discomfort.  If not improving, let us know for antibiotic course. I will send copy of note to OBGYN.

## 2010-12-23 ENCOUNTER — Telehealth: Payer: Self-pay | Admitting: Family Medicine

## 2010-12-23 DIAGNOSIS — O2340 Unspecified infection of urinary tract in pregnancy, unspecified trimester: Secondary | ICD-10-CM | POA: Insufficient documentation

## 2010-12-23 LAB — URINE CULTURE

## 2010-12-23 NOTE — Telephone Encounter (Signed)
Please call and notify UCx growing 20k proteus mirabilis R nitrofurantoin (macrobid), sensitive all others. Would like her to take amoxicillin 500mg  twice daily for 7 days.  Update [place] if sxs not improved after this. Also update on how OBGYN visit went, see if they treated her with anything. Would also like her to return 1 wk after finishing abx for repeat UA/UCx.  Order in chart for urine culture but not UA as i'm not sure what to place.

## 2010-12-24 ENCOUNTER — Telehealth: Payer: Self-pay | Admitting: Family Medicine

## 2010-12-24 MED ORDER — AMOXICILLIN 875 MG PO TABS: 875.0000 mg | ORAL_TABLET | Freq: Two times a day (BID) | ORAL | Status: DC

## 2010-12-24 NOTE — Telephone Encounter (Signed)
Message copied by Eustaquio Boyden on Fri Dec 24, 2010  4:52 PM ------      Message from: Josph Macho      Created: Fri Dec 24, 2010  2:48 PM       [facility] [address]  I didn't see where it had been done.             Thanks!

## 2010-12-24 NOTE — Telephone Encounter (Signed)
Attempted to contact patient again. Phone went straight to VM. Left message advising to only take amoxicillin if OBGYN did not prescribe anything for her. If they did, I advised her to take what they prescribed and disregard what we sent in. Advised her to call Monday and advise [place] what she took and how she is feeling.

## 2010-12-24 NOTE — Telephone Encounter (Signed)
Message left on voicemail advising patient of + culture and that abx have been sent into pharmacy. Advised her to call back with an update on the OBGYN visit and that she needs to return 1 week after abx completion for repeat UA/UCx.

## 2010-12-24 NOTE — Telephone Encounter (Signed)
Sent in amoxicillin.  Would like to check with patient to see if OBGYN treated her with anything.  If not, want her to take amox.

## 2010-12-28 NOTE — Telephone Encounter (Signed)
Spoke with patient. She is feeling much better from the yeast infection stand point. She was not given anything by the GYN for UTI. She was just starting the amoxicillin today, but said she was feeling better than she was. She did request 1 more diflucan to hang on to because she always gets a yeast infection with abx. I instructed her to eat yogurt while on the abx to help prevent an infection, but if she got one to take the diflucan. I advised that hopefully the yogurt would prevent one from starting though. She verbalized understanding. I told her I would have to get approval for the diflucan.

## 2010-12-28 NOTE — Assessment & Plan Note (Signed)
[facility]                                 ON-CALL NOTE   NAME:[name], [name]                        MRN:          [id number]  DATE:08/06/2008                            DOB:          01/28/1981    PHONE NUMBER:  [phone]   I'm her regular doctor Dr. Milinda Antis   CHIEF COMPLAINT:  Abdominal pain.   The patient says 2 weeks ago she was in the emergency room with right  upper quadrant abdominal pain and vomiting.  They did an ultrasound and  told her she did not have any gallstones and sent her home.  She said  she has recently started having worse pain all over the right side of  her abdomen.  She is vomiting and miserable.  I advised her to go back  to the emergency room for evaluation now this are symptoms that could be  worrisome for appendicitis and this is what she is going to do.     Marne A. Tower, MD  Electronically Signed    MAT/MedQ  DD: 08/06/2008  DT: 08/06/2008  Job #: [id number]

## 2010-12-28 NOTE — Telephone Encounter (Signed)
See other note

## 2010-12-28 NOTE — Telephone Encounter (Signed)
Rx called in. Message left notifying patient of Rx and not to take it if it is not needed. Advised to call and schedule repeat UA and UCx next Tuesday or Wednesday after completion of abx.

## 2010-12-28 NOTE — Procedures (Signed)
NAMEGwinda [name], [name]                 ACCOUNT NO.:  [id number]   MEDICAL RECORD NO.:  [id number]          PATIENT TYPE:  OUT   LOCATION:  SLEEP CENTER                 FACILITY:  [facility]   PHYSICIAN:  Clinton D. Maple Hudson, MD, FCCP, FACPDATE OF BIRTH:  Apr 20, 1979   DATE OF STUDY:  06/22/2007                            NOCTURNAL POLYSOMNOGRAM   REFERRING PHYSICIAN:  Antony Contras, MD   INDICATION FOR STUDY:  Hypersomnia with sleep apnea.   EPWORTH SLEEPINESS SCORE:  18/24.  BMI 38.5, weight 253 pounds, height  68 inches.   MEDICATIONS:  No home medications listed.   SLEEP ARCHITECTURE:  Total sleep time 416 minutes with sleep efficiency  94%.  Stage 1 was 3%; stage 2, 62%; stage 3, 15%; REM 20% of total sleep  time.  Sleep latency 3 minutes, REM latency 155 minutes, awake after  sleep onset 23 minutes, arousal index 8.8.  No bedtime medication taken.   RESPIRATORY DATA:  Apnea/hypopnea index (AHI) 0.4 obstructive events per  hour which is within normal limits (normal range 0-5 per hour).  There  were a total of three obstructive events, two central apneas and one  hypopnea.  These were not positional.  There were insufficient events to  qualify for CPAP titration by split protocol.   OXYGEN DATA:  Mild snoring with oxygen desaturation to a nadir of 93%.  Mean oxygen saturation through the study was 97.9% on room air.   CARDIAC DATA:  Normal sinus rhythm.   MOVEMENT/PARASOMNIA:  Occasional limb jerk with insignificant effect on  sleep.  No bathroom trips.   IMPRESSION/RECOMMENDATION:  Occasional sleep disordered breathing event,  apnea/hypopnea index 0.4 per hour (normal range 0-5 per hour).  Events  were not positional.  Mild snoring with normal oxygenation, saturation  nadir 93%.      Clinton D. Maple Hudson, MD, [facility], FACP  Diplomate, Biomedical engineer of Sleep Medicine  Electronically Signed     CDY/MEDQ  D:  07/01/2007 12:23:47  T:  07/02/2007 08:42:39  Job:  [id number]

## 2010-12-28 NOTE — Assessment & Plan Note (Signed)
[facility]                         GASTROENTEROLOGY OFFICE NOTE   NAME:Brittney [name]                        MRN:          [id number]  DATE:10/04/2007                            DOB:          June 27, 1981    PROBLEM:  Constipation.   REASON:  Brittney [name] is a pleasant 30 year old African American female  referred through the courtesy of Dr. Milinda Antis for evaluation.  She is  complaining of severe constipation.  She will have a bowel movements  every 7 to 10 days.  She has to use a laxative (suppository) to have a  bowel movement.  She feels bloated and uncomfortable in between.  She  has seen blood on the toilet tissue only.  She was colonoscoped in 2006.  That was normal.  In 2003 diminutive polyps were seen, but they were not  adenomatous.  She is taking MiraLax without improvement.   PAST MEDICAL HISTORY:  Unremarkable.  She is status post tonsillectomy.   FAMILY HISTORY:  Noncontributory.   MEDICATIONS:  She is on no medications.   ALLERGIES:  She has no allergies.   She does not smoke.  She drinks rarely.  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   REVIEW OF SYSTEMS:  Positive for back pain.   PHYSICAL EXAMINATION:  Pulse 68, blood pressure 114/76, weight 255.  HEENT: EOMI.  PERRLA.  Sclerae are anicteric.  Conjunctivae are pink.  NECK:  Supple without thyromegaly, adenopathy or carotid bruits.  CHEST:  Clear to auscultation and percussion without adventitious  sounds.  CARDIAC:  Regular rhythm; normal S1 S2.  There are no murmurs, gallops  or rubs.  ABDOMEN:  Bowel sounds are normoactive.  Abdomen is soft, nontender and  nondistended.  There are no abdominal masses, tenderness, splenic  enlargement or hepatomegaly.  EXTREMITIES:  Full range of motion.  No cyanosis, clubbing or edema.  RECTAL:  Deferred.   IMPRESSION:  Functional constipation.   RECOMMENDATIONS:  1. Fiber supplementation.  2. Trial of lactulose 15 to 30 cc twice a day.  If this  is not      successful, I will add Amitiza.     Barbette Hair. Arlyce Dice, MD,FACG  Electronically Signed    RDK/MedQ  DD: 10/04/2007  DT: 10/04/2007  Job #: [id number]   cc:   Marne A. Milinda Antis, MD

## 2010-12-28 NOTE — Letter (Signed)
October 04, 2007    Marne A. Tower, MD  [address] [place], [place] [address]   RE:  Brittney [name], Brittney [name]  MRN:  [id number]  /  DOB:  10/27/1980   Dear Dr. Milinda Antis:   Upon your kind referral, I had the pleasure of evaluating your patient  and I am pleased to offer my findings.  I saw Brittney [name] in the office  today.  Enclosed is a copy of my progress note that details my findings  and recommendations.   Thank you for the opportunity to participate in your patient's care.    Sincerely,      Barbette Hair. Arlyce Dice, MD,FACG  Electronically Signed    RDK/MedQ  DD: 10/04/2007  DT: 10/04/2007  Job #: [id number]

## 2010-12-28 NOTE — Telephone Encounter (Signed)
Ok to treat with diflucan.  Have her return for repeat UCx as per previous note.  Thanks.

## 2010-12-29 ENCOUNTER — Other Ambulatory Visit: Payer: Self-pay | Admitting: *Deleted

## 2010-12-29 MED ORDER — AMOXICILLIN 500 MG PO CAPS: 500.0000 mg | ORAL_CAPSULE | Freq: Two times a day (BID) | ORAL | Status: AC

## 2010-12-29 NOTE — Telephone Encounter (Signed)
Sent in amox 500mg  cap twice daily for 7 days.

## 2010-12-29 NOTE — Telephone Encounter (Signed)
Patient requests capsules of amoxicillin instead of the pills. She says the pills are too big and hard to swallow. I called the pharmacy and the pharmacist said that the 875 mg strength only comes in tablet form. 500 mg is in capsule form. I wasn't sure what you wanted to do. I told her I would call her back tomorrow.

## 2010-12-30 NOTE — Telephone Encounter (Signed)
Left message notifying patient of new Rx at pharmacy.

## 2011-02-16 ENCOUNTER — Inpatient Hospital Stay (HOSPITAL_COMMUNITY)
Admission: AD | Admit: 2011-02-16 | Discharge: 2011-02-16 | Disposition: A | Discharge status: Home / Self Care | Payer: BC Managed Care – PPO | Source: Ambulatory Visit | Attending: Obstetrics & Gynecology | Admitting: Obstetrics & Gynecology

## 2011-02-16 DIAGNOSIS — O99891 Other specified diseases and conditions complicating pregnancy: Secondary | ICD-10-CM | POA: Insufficient documentation

## 2011-02-16 DIAGNOSIS — R109 Unspecified abdominal pain: Secondary | ICD-10-CM

## 2011-02-16 DIAGNOSIS — O9989 Other specified diseases and conditions complicating pregnancy, childbirth and the puerperium: Secondary | ICD-10-CM

## 2011-02-16 LAB — URINALYSIS, ROUTINE W REFLEX MICROSCOPIC
Bilirubin Urine: NEGATIVE
Ketones, ur: NEGATIVE mg/dL
Nitrite: NEGATIVE
Protein, ur: NEGATIVE mg/dL
Specific Gravity, Urine: 1.02 (ref 1.005–1.030)
Urobilinogen, UA: 1 mg/dL (ref 0.0–1.0)

## 2011-03-21 LAB — STREP B DNA PROBE: GBS: POSITIVE

## 2011-03-22 ENCOUNTER — Other Ambulatory Visit (HOSPITAL_COMMUNITY): Payer: Self-pay | Admitting: Obstetrics and Gynecology

## 2011-03-22 DIAGNOSIS — Z3689 Encounter for other specified antenatal screening: Secondary | ICD-10-CM

## 2011-03-29 ENCOUNTER — Ambulatory Visit (HOSPITAL_COMMUNITY)
Admission: RE | Admit: 2011-03-29 | Discharge: 2011-03-29 | Disposition: A | Discharge status: Home / Self Care | Payer: BC Managed Care – PPO | Source: Ambulatory Visit | Attending: Obstetrics and Gynecology | Admitting: Obstetrics and Gynecology

## 2011-03-29 DIAGNOSIS — E669 Obesity, unspecified: Secondary | ICD-10-CM | POA: Insufficient documentation

## 2011-03-29 DIAGNOSIS — Z3689 Encounter for other specified antenatal screening: Secondary | ICD-10-CM | POA: Insufficient documentation

## 2011-03-29 DIAGNOSIS — O3660X0 Maternal care for excessive fetal growth, unspecified trimester, not applicable or unspecified: Secondary | ICD-10-CM | POA: Insufficient documentation

## 2011-04-04 ENCOUNTER — Encounter (HOSPITAL_COMMUNITY): Payer: Self-pay | Admitting: Anesthesiology

## 2011-04-04 ENCOUNTER — Encounter (HOSPITAL_COMMUNITY): Payer: Self-pay | Admitting: *Deleted

## 2011-04-04 ENCOUNTER — Inpatient Hospital Stay (HOSPITAL_COMMUNITY)
Admission: AD | Admit: 2011-04-04 | Discharge: 2011-04-06 | DRG: 373 | Disposition: A | Discharge status: Home / Self Care | Payer: BC Managed Care – PPO | Source: Ambulatory Visit | Attending: Obstetrics and Gynecology | Admitting: Obstetrics and Gynecology

## 2011-04-04 ENCOUNTER — Inpatient Hospital Stay (HOSPITAL_COMMUNITY): Payer: BC Managed Care – PPO | Admitting: Anesthesiology

## 2011-04-04 DIAGNOSIS — Z22330 Carrier of Group B streptococcus: Secondary | ICD-10-CM

## 2011-04-04 DIAGNOSIS — O99892 Other specified diseases and conditions complicating childbirth: Secondary | ICD-10-CM | POA: Diagnosis present

## 2011-04-04 DIAGNOSIS — O99214 Obesity complicating childbirth: Secondary | ICD-10-CM | POA: Diagnosis present

## 2011-04-04 DIAGNOSIS — E669 Obesity, unspecified: Secondary | ICD-10-CM | POA: Diagnosis present

## 2011-04-04 HISTORY — DX: Benign neoplasm of connective and other soft tissue, unspecified: D21.9

## 2011-04-04 LAB — CBC
HCT: 33.2 % — ABNORMAL LOW (ref 36.0–46.0)
Hemoglobin: 10.8 g/dL — ABNORMAL LOW (ref 12.0–15.0)
MCH: 25.8 pg — ABNORMAL LOW (ref 26.0–34.0)
MCHC: 32.5 g/dL (ref 30.0–36.0)

## 2011-04-04 MED ORDER — FENTANYL 2.5 MCG/ML BUPIVACAINE 1/10 % EPIDURAL INFUSION (WH - ANES)
14.0000 mL/h | INTRAMUSCULAR | Status: DC
Start: 2011-04-04 — End: 2011-04-04
  Filled 2011-04-04: qty 60

## 2011-04-04 MED ORDER — ONDANSETRON HCL 4 MG/2ML IJ SOLN: 4.0000 mg | Freq: Four times a day (QID) | INTRAMUSCULAR | Status: DC | PRN

## 2011-04-04 MED ORDER — LACTATED RINGERS IV SOLN: INTRAVENOUS | Status: DC

## 2011-04-04 MED ORDER — ACETAMINOPHEN 325 MG PO TABS: 650.0000 mg | ORAL_TABLET | ORAL | Status: DC | PRN

## 2011-04-04 MED ORDER — WITCH HAZEL-GLYCERIN EX PADS: 1.0000 "application " | MEDICATED_PAD | CUTANEOUS | Status: DC | PRN

## 2011-04-04 MED ORDER — OXYTOCIN BOLUS FROM INFUSION
500.0000 mL | Freq: Once | INTRAVENOUS | Status: DC
  Filled 2011-04-04: qty 500

## 2011-04-04 MED ORDER — TETANUS-DIPHTH-ACELL PERTUSSIS 5-2.5-18.5 LF-MCG/0.5 IM SUSP
0.5000 mL | Freq: Once | INTRAMUSCULAR | Status: DC
  Filled 2011-04-04: qty 0.5

## 2011-04-04 MED ORDER — EPHEDRINE 5 MG/ML INJ: 10.0000 mg | INTRAVENOUS | Status: DC | PRN

## 2011-04-04 MED ORDER — DEXTROSE 5 % IV SOLN
2.5000 10*6.[IU] | INTRAVENOUS | Status: DC
  Administered 2011-04-04: 2.5 10*6.[IU] via INTRAVENOUS
  Filled 2011-04-04 (×5): qty 2.5

## 2011-04-04 MED ORDER — ZOLPIDEM TARTRATE 5 MG PO TABS: 5.0000 mg | ORAL_TABLET | Freq: Every evening | ORAL | Status: DC | PRN

## 2011-04-04 MED ORDER — DIBUCAINE 1 % RE OINT
1.0000 "application " | TOPICAL_OINTMENT | RECTAL | Status: DC | PRN
  Filled 2011-04-04: qty 28

## 2011-04-04 MED ORDER — TERBUTALINE SULFATE 1 MG/ML IJ SOLN: 0.2500 mg | Freq: Once | INTRAMUSCULAR | Status: DC | PRN

## 2011-04-04 MED ORDER — PENICILLIN G POTASSIUM 5000000 UNITS IJ SOLR
5.0000 10*6.[IU] | Freq: Once | INTRAVENOUS | Status: DC
  Administered 2011-04-04: 5 10*6.[IU] via INTRAVENOUS
  Filled 2011-04-04: qty 5

## 2011-04-04 MED ORDER — LACTATED RINGERS IV SOLN
500.0000 mL | INTRAVENOUS | Status: DC | PRN
Start: 2011-04-04 — End: 2011-04-04
  Administered 2011-04-04: 1000 mL via INTRAVENOUS

## 2011-04-04 MED ORDER — IBUPROFEN 600 MG PO TABS
600.0000 mg | ORAL_TABLET | Freq: Four times a day (QID) | ORAL | Status: DC
  Administered 2011-04-04 – 2011-04-06 (×6): 600 mg via ORAL
  Filled 2011-04-04 (×6): qty 1

## 2011-04-04 MED ORDER — LANOLIN HYDROUS EX OINT: TOPICAL_OINTMENT | CUTANEOUS | Status: DC | PRN

## 2011-04-04 MED ORDER — PRENATAL PLUS 27-1 MG PO TABS
1.0000 | ORAL_TABLET | Freq: Every day | ORAL | Status: DC
  Administered 2011-04-05: 1 via ORAL
  Filled 2011-04-04 (×2): qty 1

## 2011-04-04 MED ORDER — LIDOCAINE HCL 1.5 % IJ SOLN
INTRAMUSCULAR | Status: DC | PRN
  Administered 2011-04-04: 5 mL via EPIDURAL
  Administered 2011-04-04: 2 mL via EPIDURAL
  Administered 2011-04-04: 5 mL via EPIDURAL

## 2011-04-04 MED ORDER — ONDANSETRON HCL 4 MG PO TABS: 4.0000 mg | ORAL_TABLET | ORAL | Status: DC | PRN

## 2011-04-04 MED ORDER — OXYTOCIN 20 UNITS IN LACTATED RINGERS INFUSION - SIMPLE
1.0000 m[IU]/min | INTRAVENOUS | Status: DC
  Administered 2011-04-04: 4 m[IU]/min via INTRAVENOUS
  Administered 2011-04-04 (×2): 6 m[IU]/min via INTRAVENOUS
  Administered 2011-04-04: 2 m[IU]/min via INTRAVENOUS
  Administered 2011-04-04: 8 m[IU]/min via INTRAVENOUS
  Filled 2011-04-04: qty 1000

## 2011-04-04 MED ORDER — SENNOSIDES-DOCUSATE SODIUM 8.6-50 MG PO TABS
2.0000 | ORAL_TABLET | Freq: Every day | ORAL | Status: DC
  Administered 2011-04-04 – 2011-04-05 (×2): 2 via ORAL

## 2011-04-04 MED ORDER — PHENYLEPHRINE 40 MCG/ML (10ML) SYRINGE FOR IV PUSH (FOR BLOOD PRESSURE SUPPORT): 80.0000 ug | PREFILLED_SYRINGE | INTRAVENOUS | Status: DC | PRN

## 2011-04-04 MED ORDER — IBUPROFEN 600 MG PO TABS: 600.0000 mg | ORAL_TABLET | Freq: Four times a day (QID) | ORAL | Status: DC | PRN

## 2011-04-04 MED ORDER — ONDANSETRON HCL 4 MG/2ML IJ SOLN: 4.0000 mg | INTRAMUSCULAR | Status: DC | PRN

## 2011-04-04 MED ORDER — DIPHENHYDRAMINE HCL 50 MG/ML IJ SOLN: 12.5000 mg | INTRAMUSCULAR | Status: DC | PRN

## 2011-04-04 MED ORDER — OXYCODONE-ACETAMINOPHEN 5-325 MG PO TABS: 2.0000 | ORAL_TABLET | ORAL | Status: DC | PRN

## 2011-04-04 MED ORDER — BENZOCAINE-MENTHOL 20-0.5 % EX AERO
1.0000 "application " | INHALATION_SPRAY | CUTANEOUS | Status: DC | PRN
  Filled 2011-04-04: qty 56

## 2011-04-04 MED ORDER — EPHEDRINE 5 MG/ML INJ
10.0000 mg | INTRAVENOUS | Status: DC | PRN
  Filled 2011-04-04: qty 4

## 2011-04-04 MED ORDER — LACTATED RINGERS IV SOLN: 500.0000 mL | Freq: Once | INTRAVENOUS | Status: DC

## 2011-04-04 MED ORDER — BUTORPHANOL TARTRATE 2 MG/ML IJ SOLN
1.0000 mg | INTRAMUSCULAR | Status: DC | PRN
  Administered 2011-04-04 (×2): 1 mg via INTRAVENOUS
  Filled 2011-04-04 (×2): qty 1

## 2011-04-04 MED ORDER — FLEET ENEMA 7-19 GM/118ML RE ENEM: 1.0000 | ENEMA | RECTAL | Status: DC | PRN

## 2011-04-04 MED ORDER — SIMETHICONE 80 MG PO CHEW: 80.0000 mg | CHEWABLE_TABLET | ORAL | Status: DC | PRN

## 2011-04-04 MED ORDER — LIDOCAINE HCL (PF) 1 % IJ SOLN
30.0000 mL | INTRAMUSCULAR | Status: DC | PRN
  Filled 2011-04-04: qty 30

## 2011-04-04 MED ORDER — PHENYLEPHRINE 40 MCG/ML (10ML) SYRINGE FOR IV PUSH (FOR BLOOD PRESSURE SUPPORT)
80.0000 ug | PREFILLED_SYRINGE | INTRAVENOUS | Status: DC | PRN
  Filled 2011-04-04: qty 5

## 2011-04-04 MED ORDER — DIPHENHYDRAMINE HCL 25 MG PO CAPS: 25.0000 mg | ORAL_CAPSULE | Freq: Four times a day (QID) | ORAL | Status: DC | PRN

## 2011-04-04 MED ORDER — OXYTOCIN 20 UNITS IN LACTATED RINGERS INFUSION - SIMPLE: 125.0000 mL/h | INTRAVENOUS | Status: AC

## 2011-04-04 MED ORDER — CITRIC ACID-SODIUM CITRATE 334-500 MG/5ML PO SOLN: 30.0000 mL | ORAL | Status: DC | PRN

## 2011-04-04 MED ORDER — OXYCODONE-ACETAMINOPHEN 5-325 MG PO TABS
1.0000 | ORAL_TABLET | ORAL | Status: DC | PRN
  Administered 2011-04-05 – 2011-04-06 (×6): 1 via ORAL
  Filled 2011-04-04 (×6): qty 1

## 2011-04-04 MED ORDER — OXYTOCIN 20 UNITS IN LACTATED RINGERS INFUSION - SIMPLE: 125.0000 mL/h | INTRAVENOUS | Status: DC | PRN

## 2011-04-04 NOTE — Progress Notes (Signed)
Pt reports ROM at 0530, denies contractions

## 2011-04-04 NOTE — H&P (Signed)
G2 P1 F at 37+ weeks gestation with SROM since 5:30AM-clear fluid.  Preg uncomplicated.  +GBS.  U/S last week showed fetus about 6+ lbs.  Not contracting.    VS stable. FH reactive.  EFW about 7 pounds Cx 1/ext os, thick, post, vtx ballotable.  IMP:  37+ weeks gestation, SROM, Morbid obesity, +GBS  Plan:  Admit, Pit, PCN, etc.

## 2011-04-04 NOTE — Progress Notes (Signed)
Fern slide collected.  Trickling of fluid noted while collecting fern.  No ferning seen on slide.  Will have midlevel recollect specimen.

## 2011-04-04 NOTE — Anesthesia Preprocedure Evaluation (Signed)
Anesthesia Evaluation  Name, MR# and DOB Patient awake  General Assessment Comment  Reviewed: Allergy & Precautions, H&P , NPO status , Patient's Chart, lab work & pertinent test results, reviewed documented beta blocker date and time   History of Anesthesia Complications Negative for: history of anesthetic complications  Airway Mallampati: I TM Distance: >3 FB Neck ROM: full    Dental  (+) Teeth Intact   Pulmonary  clear to auscultation  breath sounds clear to auscultation none    Cardiovascular regular Normal    Neuro/Psych   Headaches (migraines)  (+) PSYCHIATRIC DISORDERS (panic attacks),  Negative Psych ROS  GI/Hepatic/Renal negative GI ROS  negative Liver ROS  negative Renal ROS   GERD Medicated     Endo/Other  (+)   Morbid obesity  Abdominal   Musculoskeletal   Hematology negative hematology ROS (+)   Peds  Reproductive/Obstetrics (+) Pregnancy    Anesthesia Other Findings             Anesthesia Physical Anesthesia Plan  ASA: III  Anesthesia Plan: Epidural   Post-op Pain Management:    Induction:   Airway Management Planned:   Additional Equipment:   Intra-op Plan:   Post-operative Plan:   Informed Consent: I have reviewed the patients History and Physical, chart, labs and discussed the procedure including the risks, benefits and alternatives for the proposed anesthesia with the patient or authorized representative who has indicated his/her understanding and acceptance.     Plan Discussed with:   Anesthesia Plan Comments:         Anesthesia Quick Evaluation

## 2011-04-04 NOTE — ED Notes (Signed)
To 174 ambulatory

## 2011-04-04 NOTE — Progress Notes (Signed)
More leaking noted.  Fern recollected.

## 2011-04-04 NOTE — Anesthesia Procedure Notes (Signed)
Epidural Patient location during procedure: OB Start time: 04/04/2011 3:37 PM Reason for block: procedure for pain  Staffing Performed by: anesthesiologist   Preanesthetic Checklist Completed: patient identified, site marked, surgical consent, pre-op evaluation, timeout performed, IV checked, risks and benefits discussed and monitors and equipment checked  Epidural Patient position: sitting Prep: site prepped and draped and DuraPrep Patient monitoring: continuous pulse ox and blood pressure Approach: midline Injection technique: LOR air  Needle:  Needle type: Tuohy  Needle gauge: 17 G Needle length: 9 cm Needle insertion depth: 7 cm Catheter type: closed end flexible Catheter size: 19 Gauge Catheter at skin depth: 12 cm Test dose: negative  Assessment Events: blood not aspirated, injection not painful, no injection resistance, negative IV test and no paresthesia  Additional Notes Discussed risk of headache, infection, bleeding, nerve injury and failed or incomplete block.  Patient voices understanding and wishes to proceed.

## 2011-04-04 NOTE — Progress Notes (Signed)
NSD of viable 6 lb. 3oz. Female Apgars 9/9 over 1st degree tear.  Cord Blood Donor.  Placenta delivered intact.  2A/1V.  Tear repaired with 3-0 Rapide.  Breast feed.

## 2011-04-04 NOTE — Progress Notes (Signed)
Pt presents to [facility] for c/o SROM.  efm and toco applied.  Assessing.

## 2011-04-05 LAB — CBC
HCT: 30.5 % — ABNORMAL LOW (ref 36.0–46.0)
Platelets: 231 10*3/uL (ref 150–400)
RDW: 16 % — ABNORMAL HIGH (ref 11.5–15.5)
WBC: 10.7 10*3/uL — ABNORMAL HIGH (ref 4.0–10.5)

## 2011-04-05 NOTE — Progress Notes (Signed)
Post Partum Day 1 Subjective: no complaints, up ad lib, voiding, tolerating PO and + flatus  Objective: Blood pressure 94/62, pulse 81, temperature 98.4 F (36.9 C), temperature source Oral, resp. rate 16, height 5\' 9"  (1.753 m), weight 131.543 kg (290 lb), SpO2 100.00%, unknown if currently breastfeeding.  Physical Exam:  General: alert, cooperative, appears stated age and no distress Lochia: appropriate Uterine Fundus: firm DVT Evaluation: No evidence of DVT seen on physical exam.   Basename 04/05/11 0515 04/04/11 0850  HGB 9.9* 10.8*  HCT 30.5* 33.2*    Assessment/Plan: Plan for discharge tomorrow and Breastfeeding   LOS: 1 day   Brittney [name] H. 04/05/2011, 7:51 AM

## 2011-04-05 NOTE — Anesthesia Postprocedure Evaluation (Signed)
Patient stable following vaginal delivery.  

## 2011-04-06 MED ORDER — OXYCODONE-ACETAMINOPHEN 5-325 MG PO TABS: 1.0000 | ORAL_TABLET | ORAL | Status: AC | PRN

## 2011-04-06 NOTE — Discharge Summary (Signed)
Obstetric Discharge Summary Reason for Admission: onset of labor Prenatal Procedures: ultrasound Intrapartum Procedures: spontaneous vaginal delivery Postpartum Procedures: none Complications-Operative and Postpartum: 1st degree perineal laceration Hemoglobin  Date Value Range Status  04/05/2011 9.9* 12.0-15.0 (g/dL) Final     HCT  Date Value Range Status  04/05/2011 30.5* 36.0-46.0 (%) Final    Discharge Diagnoses: Term Pregnancy-delivered  Discharge Information: Date: 04/06/2011 Activity: pelvic rest Diet: routine Medications: PNV, Ibuprophen and Percocet Condition: stable Instructions: refer to practice specific booklet Discharge to: home Follow-up Information    Follow up with WEIN,ROBERT M in 5 weeks.   Contact information:   [address] [place] [address] [phone]          Newborn Data: Live born female  Birth Weight: 6 lb 2.9 oz (2805 g) APGAR: 9, 9  Home with mother.  Juliyah Mergen D 04/06/2011, 9:26 AM

## 2011-04-06 NOTE — Progress Notes (Signed)
  Post Partum Day 2 Subjective: no complaints  Objective: Blood pressure 99/70, pulse 64, temperature 98.2 F (36.8 C), temperature source Oral, resp. rate 18, height 5\' 9"  (1.753 m), weight 131.543 kg (290 lb), SpO2 100.00%, unknown if currently breastfeeding.  Physical Exam:  General: alert Lochia: appropriate Uterine Fundus: firm   Basename 04/05/11 0515 04/04/11 0850  HGB 9.9* 10.8*  HCT 30.5* 33.2*    Assessment/Plan: Discharge home   LOS: 2 days   Edric Fetterman D 04/06/2011, 9:24 AM

## 2011-04-08 ENCOUNTER — Inpatient Hospital Stay (HOSPITAL_COMMUNITY)
Admission: AD | Admit: 2011-04-08 | Discharge: 2011-04-09 | Disposition: A | Discharge status: Home / Self Care | Payer: BC Managed Care – PPO | Source: Ambulatory Visit | Attending: Obstetrics & Gynecology | Admitting: Obstetrics & Gynecology

## 2011-04-08 ENCOUNTER — Encounter (HOSPITAL_COMMUNITY): Payer: Self-pay | Admitting: Obstetrics and Gynecology

## 2011-04-08 DIAGNOSIS — O99893 Other specified diseases and conditions complicating puerperium: Secondary | ICD-10-CM | POA: Insufficient documentation

## 2011-04-08 DIAGNOSIS — R519 Headache, unspecified: Secondary | ICD-10-CM | POA: Diagnosis present

## 2011-04-08 DIAGNOSIS — R51 Headache: Secondary | ICD-10-CM | POA: Insufficient documentation

## 2011-04-08 LAB — COMPREHENSIVE METABOLIC PANEL WITH GFR
ALT: 18 U/L (ref 0–35)
AST: 21 U/L (ref 0–37)
Albumin: 2.8 g/dL — ABNORMAL LOW (ref 3.5–5.2)
Alkaline Phosphatase: 108 U/L (ref 39–117)
BUN: 10 mg/dL (ref 6–23)
CO2: 25 meq/L (ref 19–32)
Calcium: 9.6 mg/dL (ref 8.4–10.5)
Chloride: 104 meq/L (ref 96–112)
Creatinine, Ser: 0.73 mg/dL (ref 0.50–1.10)
GFR calc Af Amer: >60 mL/min
GFR calc non Af Amer: >60 mL/min
Glucose, Bld: 91 mg/dL (ref 70–99)
Potassium: 3.9 meq/L (ref 3.5–5.1)
Sodium: 138 meq/L (ref 135–145)
Total Bilirubin: 0.2 mg/dL — ABNORMAL LOW (ref 0.3–1.2)
Total Protein: 6.4 g/dL (ref 6.0–8.3)

## 2011-04-08 LAB — CBC
MCV: 79.3 fL (ref 78.0–100.0)
Platelets: 243 10*3/uL (ref 150–400)
RBC: 4.3 MIL/uL (ref 3.87–5.11)
WBC: 7.5 10*3/uL (ref 4.0–10.5)

## 2011-04-08 MED ORDER — IBUPROFEN 800 MG PO TABS: 800.0000 mg | ORAL_TABLET | Freq: Three times a day (TID) | ORAL | Status: AC | PRN

## 2011-04-08 MED ORDER — IBUPROFEN 800 MG PO TABS
800.0000 mg | ORAL_TABLET | Freq: Once | ORAL | Status: AC
  Administered 2011-04-08: 800 mg via ORAL
  Filled 2011-04-08: qty 1

## 2011-04-08 NOTE — Progress Notes (Signed)
Pt states, " I had my baby(vaginal birth) on Monday. At midnight last night I began having chills and I passed large clots ( golf ball size). Since then I've passed 2 more clots about the same size. I started having a headache about 5 pm, and had blurred vision."

## 2011-04-08 NOTE — ED Provider Notes (Signed)
History     Chief Complaint  Patient presents with  . Vaginal Bleeding  . Blurred Vision  . Chills  . Headache   HPI 5 days s/p NSVD. Normal pregnancy. Passing clots today, 3 golf ball sized or less. Mild cramping. Headache since 5 PM, getting worse, hasn't taken any medicine. Formula feeding now, planning on pumping "when my milk comes in".   OB History    Grav Para Term Preterm Abortions TAB SAB Ect Mult Living   2 2 2  0 0 0 0 0 0 2      Past Medical History  Diagnosis Date  . Benign neoplasm of colon   . Edema   . Flat foot   . Unspecified hearing loss   . Hematuria, unspecified   . Irritable bowel syndrome   . Obesity, unspecified   . Hemorrhage of rectum and anus   . Anal or rectal pain   . Vomiting alone   . Abnormal weight gain   . No pertinent past medical history   . IBS (irritable bowel syndrome)   . Fibroids   . Migraine     Past Surgical History  Procedure Date  . Colonoscopy 11/2001    polyp; negative pathology  . Colonoscopy 09/2004    Negative  . Uterine US 09/2005    Large endometrial stripe; small ovarian cyst  . Laparoscopic decortication / dubulking / ablation renal cysts     saline histogram  . Tonsillectomy 07/30/07  . Esophagogastroduodenoscopy 1/10    normal  . Colonoscopy w/ polypectomy     Family History  Problem Relation Age of Onset  . Ulcers Brother   . Colon cancer Maternal Grandmother   . Obesity      Paternal side  . Diabetes Paternal Grandmother   . Hypertension      Paternal side  . Hypertension      Maternal side  . Ovarian cancer Maternal Grandmother   . Clotting disorder Brother   . Diabetes Paternal Aunt   . Heart disease Father     History  Substance Use Topics  . Smoking status: Never Smoker   . Smokeless tobacco: Never Used  . Alcohol Use: No    Allergies:  Allergies  Allergen Reactions  . Lansoprazole     REACTION: rash    Prescriptions prior to admission  Medication Sig Dispense Refill  .  ondansetron (ZOFRAN-ODT) 4 MG disintegrating tablet Take 4 mg by mouth every 6 (six) hours as needed.        Marland Kitchen oxyCODONE-acetaminophen (PERCOCET) 5-325 MG per tablet Take 1-2 tablets by mouth every 4 (four) hours as needed (moderate - severe pain).  10 tablet  0  . Prenatal Vit-Fe Fumarate-FA (MYNATAL PLUS) TABS Take 1 tablet by mouth daily.        . ranitidine (ZANTAC) 150 MG tablet Take 150 mg by mouth 2 (two) times daily.          Review of Systems  Constitutional: Negative.   Respiratory: Negative.   Cardiovascular: Negative.   Gastrointestinal: Negative for nausea, vomiting, abdominal pain, diarrhea and constipation.  Genitourinary: Negative for dysuria, urgency, frequency, hematuria and flank pain.       Positive for vaginal bleeding and mild cramping   Musculoskeletal: Negative.   Neurological: Positive for headaches.  Psychiatric/Behavioral: Negative.     Physical Exam   Blood pressure 130/77, pulse 66, temperature 98 F (36.7 C), temperature source Oral, resp. rate 20, height 5' 6.5" (1.689 m), weight  125.76 kg (277 lb 4 oz), currently breastfeeding.  Physical Exam  Nursing note and vitals reviewed. Constitutional: She is oriented to person, place, and time. She appears well-developed and well-nourished. No distress.  HENT:  Head: Normocephalic and atraumatic.  Cardiovascular: Normal rate.   Respiratory: Effort normal.  GI: Soft. Bowel sounds are normal. She exhibits no mass. There is no tenderness. There is no rebound and no guarding.  Genitourinary: There is no rash or lesion on the right labia. There is no rash or lesion on the left labia. Uterus is not tender. Enlarged: fundus firm and appropriately involuted. Cervix exhibits no motion tenderness, no discharge and no friability. Right adnexum displays no mass, no tenderness and no fullness. Left adnexum displays no mass, no tenderness and no fullness. There is bleeding (small) around the vagina.  Musculoskeletal: Normal  range of motion.  Neurological: She is alert and oriented to person, place, and time.  Skin: Skin is warm and dry.  Psychiatric: She has a normal mood and affect.    MAU Course  Procedures  Results for orders placed during the hospital encounter of 04/08/11 (from the past 24 hour(s))  CBC     Status: Abnormal   Collection Time   04/08/11  9:25 PM      Component Value Range   WBC 7.5  4.0 - 10.5 (K/uL)   RBC 4.30  3.87 - 5.11 (MIL/uL)   Hemoglobin 11.0 (*) 12.0 - 15.0 (g/dL)   HCT 03.4 (*) 74.2 - 46.0 (%)   MCV 79.3  78.0 - 100.0 (fL)   MCH 25.6 (*) 26.0 - 34.0 (pg)   MCHC 32.3  30.0 - 36.0 (g/dL)   RDW 59.5 (*) 63.8 - 15.5 (%)   Platelets 243  150 - 400 (K/uL)  COMPREHENSIVE METABOLIC PANEL     Status: Abnormal   Collection Time   04/08/11  9:25 PM      Component Value Range   Sodium 138  135 - 145 (mEq/L)   Potassium 3.9  3.5 - 5.1 (mEq/L)   Chloride 104  96 - 112 (mEq/L)   CO2 25  19 - 32 (mEq/L)   Glucose, Bld 91  70 - 99 (mg/dL)   BUN 10  6 - 23 (mg/dL)   Creatinine, Ser 7.56  0.50 - 1.10 (mg/dL)   Calcium 9.6  8.4 - 43.3 (mg/dL)   Total Protein 6.4  6.0 - 8.3 (g/dL)   Albumin 2.8 (*) 3.5 - 5.2 (g/dL)   AST 21  0 - 37 (U/L)   ALT 18  0 - 35 (U/L)   Alkaline Phosphatase 108  39 - 117 (U/L)   Total Bilirubin 0.2 (*) 0.3 - 1.2 (mg/dL)   GFR calc non Af Amer >60  >60 (mL/min)   GFR calc Af Amer >60  >60 (mL/min)  LACTATE DEHYDROGENASE     Status: Normal   Collection Time   04/08/11  9:25 PM      Component Value Range   LD 234  94 - 250 (U/L)  URIC ACID     Status: Normal   Collection Time   04/08/11  9:25 PM      Component Value Range   Uric Acid, Serum 6.1  2.4 - 7.0 (mg/dL)    Headache improved with Motrin. Bleeding minimal/stable.   Assessment and Plan  30 y.o. G2P2002, 5 days postpartum Headache - motrin PRN Bleeding WNL - precautions rev'd F/U as scheduled Encouraged to start pumping now if breastfeeding is  desired Rev'd with Dr. Aldona Bar, agrees with  plan   [facility] 04/08/2011, 9:55 PM

## 2011-04-25 ENCOUNTER — Emergency Department: Payer: Self-pay | Admitting: Emergency Medicine

## 2011-04-26 ENCOUNTER — Telehealth: Payer: Self-pay | Admitting: *Deleted

## 2011-04-26 DIAGNOSIS — K8010 Calculus of gallbladder with chronic cholecystitis without obstruction: Secondary | ICD-10-CM | POA: Insufficient documentation

## 2011-04-26 DIAGNOSIS — K8020 Calculus of gallbladder without cholecystitis without obstruction: Secondary | ICD-10-CM

## 2011-04-26 NOTE — Telephone Encounter (Signed)
Pt was seen at [facility] last night, diagnosed with gallstones and sludge.  They referred her to a surgeon in [place], but she prefers to see someone.  She is asking if you will refer her.

## 2011-04-26 NOTE — Telephone Encounter (Signed)
Patient notified as instructed by telephone. Pt did want to see Dr in [facility].

## 2011-04-26 NOTE — Telephone Encounter (Signed)
I assume you meant - someone in [place]? -- I went ahead and made ref Let her know she will be getting a call from pt care coordinator

## 2011-04-27 ENCOUNTER — Ambulatory Visit (INDEPENDENT_AMBULATORY_CARE_PROVIDER_SITE_OTHER): Payer: BC Managed Care – PPO | Admitting: Surgery

## 2011-04-27 ENCOUNTER — Encounter (INDEPENDENT_AMBULATORY_CARE_PROVIDER_SITE_OTHER): Payer: Self-pay | Admitting: Surgery

## 2011-04-27 DIAGNOSIS — K8010 Calculus of gallbladder with chronic cholecystitis without obstruction: Secondary | ICD-10-CM

## 2011-04-27 DIAGNOSIS — K589 Irritable bowel syndrome without diarrhea: Secondary | ICD-10-CM

## 2011-04-27 NOTE — Patient Instructions (Signed)
See Handout(s).  Consider surgery.  Please call if you have further questions or concerns.

## 2011-04-27 NOTE — Progress Notes (Signed)
Subjective:     Patient ID: Brittney [name], female   DOB: [birth year], 30 y.o.   MRN: [id number]  HPI  Reason for visit: Abd pain & nausea.  ?Biliary.  Morbidly obese female with episodes of abdominal pain with nausea.  Vomiting also sometimes.  She is recently past partum with NSVD.  She suffered with heartburn during pregnancy rather intensely but that has improved.  These symptoms are different.  Epigastric>>LUQ crampy pain usually after food.  Went to [facility] ED.  Workup with mildly elevated LFTs and sludge in gallbladder by ultrasound.  She wished to be seen/treated in [facility], so her PCP, Brittney [name], sent the pt to [place] for evaluation.  She has had diarrhea/bowel issues for several years with presumptive diagnosis of IBS.  Prior endoscopies by [facility] noted polyps in past, not recently.  No cardiopulmonary issues.  No heavy EtOH use.  No sick contacts/travel history. .    Past Medical History  Diagnosis Date  . Benign neoplasm of colon   . Edema   . Flat foot   . Unspecified hearing loss   . Hematuria, unspecified   . Irritable bowel syndrome   . Obesity, unspecified   . Hemorrhage of rectum and anus   . Anal or rectal pain   . Vomiting alone   . Abnormal weight gain   . No pertinent past medical history   . IBS (irritable bowel syndrome)   . Fibroids   . Migraine   . Abdominal pain   . Nausea and vomiting    Past Surgical History  Procedure Date  . Colonoscopy 11/2001    polyp; negative pathology  . Colonoscopy 09/2004    Negative  . Uterine US 09/2005    Large endometrial stripe; small ovarian cyst  . Laparoscopic decortication / dubulking / ablation renal cysts     saline histogram  . Tonsillectomy 07/30/07  . Esophagogastroduodenoscopy 1/10    normal  . Colonoscopy w/ polypectomy    Current outpatient prescriptions:ranitidine (ZANTAC) 150 MG tablet, Take 150 mg by mouth 2 (two) times daily.  , Disp: , Rfl: ;  SUMAtriptan Succinate (IMITREX PO),  Take by mouth as needed.  , Disp: , Rfl: ;  ibuprofen (ADVIL,MOTRIN) 800 MG tablet, , Disp: , Rfl: ;  ketoconazole (NIZORAL) 200 MG tablet, , Disp: , Rfl: ;  Prenatal Vit-Fe Fumarate-FA (MYNATAL PLUS) TABS, Take 1 tablet by mouth daily.  , Disp: , Rfl:   Allergies  Allergen Reactions  . Lansoprazole     REACTION: rash   History   Social History  . Marital Status: Married    Spouse Name: N/A    Number of Children: 1  . Years of Education: N/A   Occupational History  . Teacher    Social History Main Topics  . Smoking status: Never Smoker   . Smokeless tobacco: Never Used  . Alcohol Use: No  . Drug Use: No  . Sexually Active: Yes   Other Topics Concern  . 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 Ulcers Brother   . Colon cancer Maternal Grandmother   . Ovarian cancer Maternal Grandmother   . Obesity      Paternal side  . Diabetes Paternal Grandmother   . Hypertension      Maternal side/Paternal side  . Clotting disorder Brother   . Diabetes Paternal  Aunt   . Heart disease Father      Review of Systems  Constitutional: Negative for fever, chills, diaphoresis, appetite change and fatigue.  HENT: Negative for ear pain, sore throat, trouble swallowing, neck pain and ear discharge.   Eyes: Negative for photophobia, discharge and visual disturbance.  Respiratory: Negative for cough, choking, chest tightness and shortness of breath.   Cardiovascular: Negative for chest pain, palpitations and leg swelling.       Walk 1/2-1 mile w/o problems  Gastrointestinal: Positive for nausea, vomiting, abdominal pain, diarrhea and abdominal distention. Negative for constipation, blood in stool, anal bleeding and rectal pain.       Loose BM common.  Constipation uncommon.  Genitourinary: Negative for dysuria, frequency and difficulty urinating.  Musculoskeletal: Negative for myalgias and gait  problem.  Skin: Negative for color change, pallor and rash.  Neurological: Negative for dizziness, speech difficulty, weakness and numbness.  Hematological: Negative for adenopathy.  Psychiatric/Behavioral: Negative for confusion and agitation. The patient is not nervous/anxious.        Objective:   Physical Exam  Constitutional: She is oriented to person, place, and time. She appears well-developed and well-nourished. No distress.  HENT:  Head: Normocephalic.  Mouth/Throat: Oropharynx is clear and moist. No oropharyngeal exudate.  Eyes: Conjunctivae and EOM are normal. Pupils are equal, round, and reactive to light. No scleral icterus.  Neck: Normal range of motion. Neck supple. No tracheal deviation present.  Cardiovascular: Normal rate, regular rhythm and intact distal pulses.   Pulmonary/Chest: Effort normal and breath sounds normal. No respiratory distress. She exhibits no tenderness.  Abdominal: Soft. She exhibits no distension and no mass. There is no tenderness. There is no rebound and no guarding. Hernia confirmed negative in the right inguinal area and confirmed negative in the left inguinal area.       Obese, soft.  Mild epigastric discomfort  Genitourinary: No vaginal discharge found.  Musculoskeletal: Normal range of motion. She exhibits no tenderness.  Lymphadenopathy:    She has no cervical adenopathy.       Right: No inguinal adenopathy present.       Left: No inguinal adenopathy present.  Neurological: She is alert and oriented to person, place, and time. No cranial nerve deficit. She exhibits normal muscle tone. Coordination normal.  Skin: Skin is warm and dry. No rash noted. She is not diaphoretic. No erythema.  Psychiatric: She has a normal mood and affect. Her behavior is normal. Judgment and thought content normal.       Assessment:     Symptomatic gallstones.  Mostly typical symptoms.  Delayed gastric emptying less likely etiology.    Plan:     I think the  patient would benefit from lap chole.  I did offer her to see GI first to complete the workup, but I feel it is likely not to explain the symptoms better than gallbladder/stones.  She wishes to proceed with surgery first, knowing there is a small chance that it does not address the symptoms.  The anatomy & physiology of hepatobiliary & pancreatic function was discussed.  The pathophysiology of gallbladder dysfunction was discussed.  Natural history risks without surgery was discussed.   I feel the risks of no intervention will lead to serious problems that outweigh the operative risks; therefore, I recommended cholecystectomy to remove the pathology.  I explained laparoscopic techniques with possible need for an open approach.  Probable cholangiogram to evaluate the bilary tract was explained as well.  Risks such as bleeding, infection, abscess, leak, injury to other organs, need for further treatment, heart attack, death, and other risks were discussed.  Possibility that this will not correct all abdominal symptoms was explained.  Goals of post-operative recovery were discussed as well.  We will work to minimize complications.  An educational handout further explaining the pathology and treatment options was given as well.  Questions were answered.  The patient expresses understanding & wishes to proceed with surgery.

## 2011-04-28 ENCOUNTER — Encounter (INDEPENDENT_AMBULATORY_CARE_PROVIDER_SITE_OTHER): Payer: Self-pay | Admitting: Surgery

## 2011-05-04 ENCOUNTER — Other Ambulatory Visit (INDEPENDENT_AMBULATORY_CARE_PROVIDER_SITE_OTHER): Payer: Self-pay | Admitting: Surgery

## 2011-05-04 ENCOUNTER — Other Ambulatory Visit: Payer: Self-pay | Admitting: Anesthesiology

## 2011-05-04 ENCOUNTER — Encounter (HOSPITAL_COMMUNITY): Payer: BC Managed Care – PPO

## 2011-05-04 LAB — SURGICAL PCR SCREEN: Staphylococcus aureus: NEGATIVE

## 2011-05-04 LAB — HCG, SERUM, QUALITATIVE: Preg, Serum: NEGATIVE

## 2011-05-06 ENCOUNTER — Other Ambulatory Visit (INDEPENDENT_AMBULATORY_CARE_PROVIDER_SITE_OTHER): Payer: Self-pay | Admitting: Surgery

## 2011-05-06 ENCOUNTER — Ambulatory Visit (HOSPITAL_COMMUNITY): Payer: BC Managed Care – PPO

## 2011-05-06 ENCOUNTER — Ambulatory Visit (HOSPITAL_COMMUNITY)
Admission: RE | Admit: 2011-05-06 | Discharge: 2011-05-06 | Disposition: A | Discharge status: Home / Self Care | Payer: BC Managed Care – PPO | Source: Ambulatory Visit | Attending: Surgery | Admitting: Surgery

## 2011-05-06 DIAGNOSIS — E669 Obesity, unspecified: Secondary | ICD-10-CM | POA: Insufficient documentation

## 2011-05-06 DIAGNOSIS — R109 Unspecified abdominal pain: Secondary | ICD-10-CM | POA: Insufficient documentation

## 2011-05-06 DIAGNOSIS — Z01812 Encounter for preprocedural laboratory examination: Secondary | ICD-10-CM | POA: Insufficient documentation

## 2011-05-06 DIAGNOSIS — R112 Nausea with vomiting, unspecified: Secondary | ICD-10-CM | POA: Insufficient documentation

## 2011-05-06 DIAGNOSIS — K8010 Calculus of gallbladder with chronic cholecystitis without obstruction: Secondary | ICD-10-CM

## 2011-05-06 HISTORY — PX: CHOLECYSTECTOMY: SHX55

## 2011-05-11 NOTE — Op Note (Addendum)
NAMEEllouise [name] Geraldine          ACCOUNT NO.:  [id number]  MEDICAL RECORD NO.:  [id number]  LOCATION:  [facility]                         FACILITY:  [facility]  PHYSICIAN:  Ardeth Sportsman, MD     DATE OF BIRTH:  July 17, 1981  DATE OF PROCEDURE:  05/06/2011 DATE OF DISCHARGE:                              OPERATIVE REPORT   PRIMARY CARE PHYSICIAN:  Marne A. Tower, MD  SURGEON:  Ardeth Sportsman, MD  PREOPERATIVE DIAGNOSES:  Biliary colic with gallstones, probable chronic cholecystitis.  POSTOPERATIVE DIAGNOSES:  Biliary colic with gallstones, probable chronic cholecystitis.  PROCEDURE PERFORMED:  Laparoscopic cholecystectomy with intraoperative cholangiogram (single-site technique).  SPECIMENS:  Gallbladder.  DRAINS:  None.  ESTIMATED BLOOD LOSS:  Minimal.  COMPLICATIONS:  None.  INDICATIONS:  [name] is a 30 year old obese female.  She had some episodes of abdominal pain and nausea and sometimes with vomiting. It is different from the heartburn reflux-type symptoms.  She has known gallstones.  A differential diagnosis seemed less likely.  The anatomy and physiology of hepatobiliary and pancreatic function was discussed.  Pathophysiology of cholecystolithiasis with its natural history and risks were discussed.  Options were discussed. Recommendation is made for laparoscopic cholecystectomy with intraoperative cholangiogram.  Risks, benefits, and alternatives were discussed.  Offer of  a Gastroenterology evaluation for more aggressive rule out of the differential diagnosis was discussed.  Patient wished to proceed with surgery.  OPERATIVE FINDINGS:  She had a dilated, bulky gallbladder with some small, but different gallstones within them.  No major adhesions.  A little bit of gallbladder wall thickening associated.  Cholangiogram showed no significant abnormalities.  She did have some mild fatty changes of the liver, but not severe.  DESCRIPTION OF PROCEDURE:   Informed consent was confirmed.  The patient underwent general anesthesia without any difficulty.  She was positioned supine with both arms tucked.  She had sequential compression devices active during the entire case.  Her abdomen was prepped and draped in sterile fashion.  Surgical time-out confirmed our plan.  I placed a 5 mm long port just superior to the umbilical stalk through a curvilinear incision to the superior cord of the umbilicus.  I induced carbon dioxide insufflation.  The entry was clean.  I saw no evidence of any injury.  I did camera inspection and saw no evidence of any significant adhesions to the anterior abdominal wall.  I extended the incision about 180 degrees to surround the umbilicus.  I placed a 5 mm port in the left upper aspect of the wound and a 5 mm grasper in the right inferior aspect of the wound.  I turned the attention toward the right upper quadrant, elevated the liver and found dilated, bulky gallbladder elevated cephalad.  She did have a rather pedunculated medial left hepatic lobe lobular mass coming off the liver just consistent with an anatomic ovarian.  She had a dominant median and left hepatic lobe as well which was slight chance to viewing, but I was able to elevate the gallbladder and using a grasper on the right side to hold liver out of the way, I can see the infundibulum of gallbladder.  I alternated between hook cautery and Marilyn blunt dissection  and focused ultrasonic dissection to free the peritoneal coverings off the posterior lateral wall of the gallbladder and anterior mid wall of the gallbladder.  I came around inside the dominant vessel going on the anterior mid wall of the gallbladder consistent with cystic artery and I isolated and ligated that using ultrasonic dissection.  I did further dissection to free the proximal half of the gallbladder off the liver bed to get the better look of the infundibulum and get a look critical  view and I skeletonized the cystic duct several centimeters more proximally.  I found posterior cystic artery branch and ligated that as well.  I placed a clip on the infundibulum and did a partial cystic ductotomy.  I placed a 5-French cholangiogram catheter through puncture site along the right subcostal ridge and placed in a cystic duct.  I ran a  cholangiogram using dilute radiopaque and continuous fluoroscopy.  Contrast easily flew down the common bile duct across the normal ampulla into the duodenum.  It refluxed up to the common hepatic duct into the right and left intrahepatic change with secondary tissue radicles.  It was consistent with pretty difficulty anatomy with no major abnormalities.  I removed cholangiogram catheter and placed and placed 4 clips on the cystic duct just proximal to the cystic ductotomy, and completed cystic duct transaction close to the infundibulum.  I freed the gallbladder from its main attachment on the liver bed.  I ensured hemostasis on the liver bed with some spatulated tip cautery.  I did some irrigation and evacuated blood.  I reinspected the stalk and cystic duct stump was checked with no leak of bile or no bleeding.  Hemostasis was excellent. I saw no injury to any other structures.  I evacuated the carbon dioxide, removed the gallbladder out the supraumbilical midline wound after some general dilation to 7 mm to getting out.  I reapproximated the fascia transversely using interrupted 0 Vicryl stitches.  I closed the skin using 4-0 Monocryl stitches.  Sterile dressing was applied.  The patient was extubated and sent to the recovery room in stable condition.  I discussed postop care with the patient in detail in my office and prior to surgery.  I have written instructions.  I am about to discuss with family as well.     Ardeth Sportsman, MD     SCG/MEDQ  D:  05/06/2011  T:  05/07/2011  Job:  [id number]  cc:   Marne A. Milinda Antis, MD Fax:  [phone]  Electronically Signed by Karie Soda MD on 06/08/2011 08:55:17 PM

## 2011-05-16 ENCOUNTER — Encounter: Payer: Self-pay | Admitting: Surgery

## 2011-05-18 ENCOUNTER — Encounter (INDEPENDENT_AMBULATORY_CARE_PROVIDER_SITE_OTHER): Payer: Self-pay | Admitting: Surgery

## 2011-05-18 ENCOUNTER — Ambulatory Visit (INDEPENDENT_AMBULATORY_CARE_PROVIDER_SITE_OTHER): Payer: BC Managed Care – PPO | Admitting: Surgery

## 2011-05-18 VITALS — BP 118/86 | HR 64 | Temp 97.0°F | Resp 20 | Ht 68.0 in | Wt 260.2 lb

## 2011-05-18 DIAGNOSIS — K8010 Calculus of gallbladder with chronic cholecystitis without obstruction: Secondary | ICD-10-CM

## 2011-05-18 NOTE — Progress Notes (Signed)
Subjective:     Patient ID: Brittney [name], female   DOB: [birth year], 30 y.o.   MRN: [id number]  HPI  Patient Care Team: Roxy Manns, MD as PCP - General Louis Meckel, MD as Consulting Physician (Gastroenterology)  This patient is a 30 y.o.female who presents today for surgical evaluation.   Procedure: Single site laparoscopic cholecystectomy and cholangiogram 04/16/2011  Pathology: Chronic cholecystitis and cholecystolithiasis  The patient comes in today feeling quite well. Off narcotics in 2 days. Regular bowel movements.No constipation nor diarrhea. Energy good. Taking care of newborn without problems. No drainage.   Past Medical History  Diagnosis Date  . Benign neoplasm of colon   . Edema   . Flat foot   . Unspecified hearing loss   . Hematuria, unspecified   . Irritable bowel syndrome   . Obesity, unspecified   . Hemorrhage of rectum and anus   . Anal or rectal pain   . Abnormal weight gain   . No pertinent past medical history   . Fibroids   . Migraine   . Abdominal pain   . Nausea and vomiting     Past Surgical History  Procedure Date  . Colonoscopy 11/2001    polyp; negative pathology  . Colonoscopy 09/2004    Negative  . Uterine US 09/2005    Large endometrial stripe; small ovarian cyst  . Laparoscopic decortication / dubulking / ablation renal cysts     saline histogram  . Tonsillectomy 07/30/07  . Esophagogastroduodenoscopy 1/10    normal  . Colonoscopy w/ polypectomy   . Cholecystectomy 05/06/11    History   Social History  . Marital Status: Married    Spouse Name: N/A    Number of Children: 1  . Years of Education: N/A   Occupational History  . Teacher    Social History Main Topics  . Smoking status: Never Smoker   . Smokeless tobacco: Never Used  . Alcohol Use: No  . Drug Use: No  . Sexually Active: Yes   Other Topics Concern  . 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 Ulcers Brother   . Colon cancer Maternal Grandmother   . Ovarian cancer Maternal Grandmother   . Obesity      Paternal side  . Diabetes Paternal Grandmother   . Hypertension      Maternal side/Paternal side  . Clotting disorder Brother   . Diabetes Paternal Aunt   . Heart disease Father     Current outpatient prescriptions:ibuprofen (ADVIL,MOTRIN) 800 MG tablet, , Disp: , Rfl: ;  ketoconazole (NIZORAL) 200 MG tablet, , Disp: , Rfl: ;  Prenatal Vit-Fe Fumarate-FA (MYNATAL PLUS) TABS, Take 1 tablet by mouth daily.  , Disp: , Rfl: ;  ranitidine (ZANTAC) 150 MG tablet, Take 150 mg by mouth 2 (two) times daily.  , Disp: , Rfl: ;  SUMAtriptan Succinate (IMITREX PO), Take by mouth as needed.  , Disp: , Rfl:   Allergies  Allergen Reactions  . Lansoprazole     REACTION: rash       Review of Systems  Constitutional: Negative for fever, chills and diaphoresis.  HENT: Negative for ear pain, sore throat and trouble swallowing.   Eyes: Negative for photophobia and visual disturbance.  Respiratory: Negative for cough and choking.   Cardiovascular: Negative for chest pain and palpitations.  Gastrointestinal: Negative  for nausea, vomiting, abdominal pain, diarrhea, constipation, anal bleeding and rectal pain.  Genitourinary: Negative for dysuria, frequency and difficulty urinating.  Musculoskeletal: Negative for myalgias and gait problem.  Skin: Negative for color change, pallor and rash.  Neurological: Negative for dizziness, speech difficulty, weakness and numbness.  Hematological: Negative for adenopathy.  Psychiatric/Behavioral: Negative for confusion and agitation. The patient is not nervous/anxious.        Objective:   Physical Exam  Constitutional: She is oriented to person, place, and time. She appears well-developed and well-nourished. No distress.  HENT:  Head: Normocephalic.  Mouth/Throat: Oropharynx is clear and moist. No oropharyngeal  exudate.  Eyes: Conjunctivae and EOM are normal. Pupils are equal, round, and reactive to light. No scleral icterus.  Neck: Normal range of motion. No tracheal deviation present.  Cardiovascular: Normal rate and intact distal pulses.   Pulmonary/Chest: Effort normal. No respiratory distress. She exhibits no tenderness.  Abdominal: Soft. She exhibits no distension and no mass. There is no tenderness. Hernia confirmed negative in the right inguinal area and confirmed negative in the left inguinal area.       Incision clean with normal healing ridges.  No hernias  Morbidly obese  Genitourinary: No vaginal discharge found.  Musculoskeletal: Normal range of motion. She exhibits no tenderness.  Lymphadenopathy:       Right: No inguinal adenopathy present.       Left: No inguinal adenopathy present.  Neurological: She is alert and oriented to person, place, and time. No cranial nerve deficit. She exhibits normal muscle tone. Coordination normal.  Skin: Skin is warm and dry. No rash noted. She is not diaphoretic.  Psychiatric: She has a normal mood and affect. Her behavior is normal.       Assessment:     POD# 12 lap chole, recovering well    Plan:     Increase activity as tolerated.  Do not push through pain.  Advanced on diet as tolerated. Bowel regimen to avoid problems.  Return to clinic p.r.n. The patient expressed understanding and appreciation

## 2011-06-07 ENCOUNTER — Telehealth: Payer: Self-pay | Admitting: Gastroenterology

## 2011-06-07 NOTE — Telephone Encounter (Signed)
Pt states she has been seeing some blood when she has a bowel movement. Requesting to be seen. Pt scheduled to see Dr. Arlyce Dice 06/22/11@3 :15pm. Pt aware of appt date and time.

## 2011-06-22 ENCOUNTER — Encounter: Payer: Self-pay | Admitting: Gastroenterology

## 2011-06-22 ENCOUNTER — Ambulatory Visit (INDEPENDENT_AMBULATORY_CARE_PROVIDER_SITE_OTHER): Payer: BC Managed Care – PPO | Admitting: Gastroenterology

## 2011-06-22 VITALS — BP 112/70 | HR 76 | Ht 66.5 in | Wt 267.0 lb

## 2011-06-22 DIAGNOSIS — K625 Hemorrhage of anus and rectum: Secondary | ICD-10-CM

## 2011-06-22 MED ORDER — HYDROCORTISONE ACETATE 25 MG RE SUPP: 25.0000 mg | Freq: Two times a day (BID) | RECTAL | Status: DC

## 2011-06-22 NOTE — Progress Notes (Signed)
[name]  is a 30 year old Latin American female referred at the request of Dr. Milinda Antis for evaluation of rectal bleeding. For several days 2-3 weeks ago she noted blood on the toilet tissue. She denies abdominal or rectal pain. This has been an intermittent problem in the past. It tends to occur when she has alternating episodes of constipation and diarrhea.  She underwent a colonoscopy in 2003 that demonstrated diminutive polyps. Pathology is not available. 2006 colonoscopy was negative.      Past Medical History  Diagnosis Date  . Benign neoplasm of colon   . Irritable bowel syndrome   . Obesity, unspecified   . Fibroids   . Migraine    Past Surgical History  Procedure Date  . Colonoscopy 11/2001    polyp; negative pathology  . Colonoscopy 09/2004    Negative  . Uterine US 09/2005    Large endometrial stripe; small ovarian cyst  . Laparoscopic decortication / dubulking / ablation renal cysts     saline histogram  . Tonsillectomy 07/30/07  . Esophagogastroduodenoscopy 1/10    normal  . Colonoscopy w/ polypectomy   . Cholecystectomy 05/06/11   Current Outpatient Prescriptions  Medication Sig Dispense Refill  . SUMAtriptan Succinate (IMITREX PO) Take by mouth as needed.          reports that she has never smoked. She has never used smokeless tobacco. She reports that she does not drink alcohol or use illicit drugs. family history includes Clotting disorder in her brother; Colon cancer in her maternal grandmother; Diabetes in her paternal aunt and paternal grandmother; Heart disease in her father; Hypertension in an unspecified family member; Obesity in an unspecified family member; Ovarian cancer in her maternal grandmother; and Ulcers in her brother.    Review of Systems: Pertinent positive and negative review of systems were noted in the above HPI section. All other review of systems were otherwise negative.  Vital signs were reviewed in today's medical  record Physical Exam: General: Well developed , well nourished, no acute distress Head: Normocephalic and atraumatic Eyes:  sclerae anicteric, EOMI Ears: Normal auditory acuity Mouth: No deformity or lesions Neck: Supple, no masses or thyromegaly Lungs: Clear throughout to auscultation Heart: Regular rate and rhythm; no murmurs, rubs or bruits Abdomen: Soft, non tender and non distended. No masses, hepatosplenomegaly or hernias noted. Normal Bowel sounds Rectal: There are no external lesions Musculoskeletal: Symmetrical with no gross deformities  Skin: No lesions on visible extremities Pulses:  Normal pulses noted Extremities: No clubbing, cyanosis, edema or deformities noted Neurological: Alert oriented x 4, grossly nonfocal Cervical Nodes:  No significant cervical adenopathy Inguinal Nodes: No significant inguinal adenopathy Psychological:  Alert and cooperative. Normal mood and affect

## 2011-06-22 NOTE — Assessment & Plan Note (Signed)
Symptoms are very likely due to hemorrhoidal bleeding.  Recommendations #1 Anusol HC suppositories as needed #2 the patient was carefully instructed to contact me if bleeding becomes a recurrent problem at which point I would do either a sigmoidoscopy or colonoscopy

## 2011-06-22 NOTE — Patient Instructions (Signed)
Follow up as needed

## 2011-07-11 ENCOUNTER — Ambulatory Visit (INDEPENDENT_AMBULATORY_CARE_PROVIDER_SITE_OTHER): Payer: BC Managed Care – PPO | Admitting: Family Medicine

## 2011-07-11 ENCOUNTER — Encounter: Payer: Self-pay | Admitting: Family Medicine

## 2011-07-11 VITALS — BP 100/72 | HR 80 | Temp 98.3°F | Ht 68.0 in

## 2011-07-11 DIAGNOSIS — J029 Acute pharyngitis, unspecified: Secondary | ICD-10-CM

## 2011-07-11 LAB — POCT RAPID STREP A (OFFICE): Rapid Strep A Screen: NEGATIVE

## 2011-07-11 NOTE — Progress Notes (Signed)
SUBJECTIVE: 30 y.o. female with here to be tested for strep throat because her son just tested positive for it. She has a small infant at home and wants to make sure she doesn't have have it.  She denies sore throat, myalgias, swollen glands, headache or fever.  No history of rheumatic fever.  Patient Active Problem List  Diagnoses  . COLONIC POLYPS  . OBESITY  . PANIC ATTACK  . MIGRAINE HEADACHE  . HEARING LOSS, MILD  . IBS  . RECTAL BLEEDING  . RECTAL PAIN  . HEMATURIA UNSPECIFIED  . OSTEOARTHRITIS  . EDEMA  . WEIGHT GAIN  . Vaginal discharge  . UTI in pregnancy  . Postpartum state  . Headache   Past Medical History  Diagnosis Date  . Benign neoplasm of colon   . Irritable bowel syndrome   . Obesity, unspecified   . Fibroids   . Migraine    Past Surgical History  Procedure Date  . Colonoscopy 11/2001    polyp; negative pathology  . Colonoscopy 09/2004    Negative  . Uterine US 09/2005    Large endometrial stripe; small ovarian cyst  . Laparoscopic decortication / dubulking / ablation renal cysts     saline histogram  . Tonsillectomy 07/30/07  . Esophagogastroduodenoscopy 1/10    normal  . Colonoscopy w/ polypectomy   . Cholecystectomy 05/06/11   History  Substance Use Topics  . Smoking status: Never Smoker   . Smokeless tobacco: Never Used  . Alcohol Use: No   Family History  Problem Relation Age of Onset  . Ulcers Brother   . Colon cancer Maternal Grandmother   . Ovarian cancer Maternal Grandmother   . Obesity      Paternal side  . Diabetes Paternal Grandmother   . Hypertension      Maternal side/Paternal side  . Clotting disorder Brother   . Diabetes Paternal Aunt   . Heart disease Father    Allergies  Allergen Reactions  . Lansoprazole     REACTION: rash   Current Outpatient Prescriptions on File Prior to Visit  Medication Sig Dispense Refill  . hydrocortisone (ANUSOL-HC) 25 MG suppository Place 1 suppository (25 mg total) rectally every 12  (twelve) hours.  12 suppository  1  . SUMAtriptan Succinate (IMITREX PO) Take by mouth as needed.         The PMH, PSH, Social History, Family History, Medications, and allergies have been reviewed in [facility], and have been updated if relevant.  OBJECTIVE:  BP 100/72  Pulse 80  Temp(Src) 98.3 F (36.8 C) (Oral)  Ht 5\' 8"  (1.727 m)  LMP 06/25/2011  Appears alert, well appearing, and in no distress. Ears: bilateral TM's and external ear canals normal Oropharynx: mucous membranes moist, pharynx normal without lesions Neck: supple, no significant adenopathy Lungs: clear to auscultation, no wheezes, rales or rhonchi, symmetric air entry Rapid Strep test is negative  ASSESSMENT: Negative strep. Reassurance provided.  PLAN: Per orders. Gargle, use acetaminophen or other OTC analgesic, and take Rx fully as prescribed. Call if other family members develop similar symptoms. See prn.

## 2011-09-07 ENCOUNTER — Ambulatory Visit: Payer: BC Managed Care – PPO | Admitting: Gynecology

## 2011-09-21 ENCOUNTER — Ambulatory Visit (INDEPENDENT_AMBULATORY_CARE_PROVIDER_SITE_OTHER): Payer: BC Managed Care – PPO | Admitting: Gastroenterology

## 2011-09-21 ENCOUNTER — Telehealth: Payer: Self-pay | Admitting: Gastroenterology

## 2011-09-21 ENCOUNTER — Encounter: Payer: Self-pay | Admitting: Gastroenterology

## 2011-09-21 VITALS — BP 134/68 | HR 88 | Ht 67.0 in | Wt 249.8 lb

## 2011-09-21 DIAGNOSIS — K625 Hemorrhage of anus and rectum: Secondary | ICD-10-CM

## 2011-09-21 MED ORDER — PEG-KCL-NACL-NASULF-NA ASC-C 100 G PO SOLR: 1.0000 | Freq: Once | ORAL | Status: DC

## 2011-09-21 NOTE — Progress Notes (Signed)
History of Present Illness:  [name] returns for evaluation of a abdominal pain and rectal bleeding. She is complaining of left-sided abdominal pain accompanied by frequent loose stools consisting of stool mixed with blood and mucus. She may have 3-4 bowel movements a day.  Review of her past medical records reveals that diminutive polyps were removed in 2003. Non-adenomatous changes were seen.    Review of Systems: Pertinent positive and negative review of systems were noted in the above HPI section. All other review of systems were otherwise negative.    Current Medications, Allergies, Past Medical History, Past Surgical History, Family History and Social History were reviewed in [employer] electronic medical record  Vital signs were reviewed in today's medical record. Physical Exam: General: Well developed , well nourished, no acute distress

## 2011-09-21 NOTE — Telephone Encounter (Signed)
Pt states she is having a clear gel substance and blood in her bowel movements. She is complaining of abdominal pain and cramping also. Pt scheduled to see Dr. Arlyce Dice today at 3pm. Pt aware of appt date and time.

## 2011-09-21 NOTE — Assessment & Plan Note (Signed)
Persistent rectal bleeding with passage of mucus, loose stools and abdominal pain raise the question of inflammatory bowel disease.  Recommendations #1 colonoscopy

## 2011-09-21 NOTE — Patient Instructions (Signed)
Colonoscopy A colonoscopy is an exam to evaluate your entire colon. In this exam, your colon is cleansed. A long fiberoptic tube is inserted through your rectum and into your colon. The fiberoptic scope (endoscope) is a long bundle of enclosed and very flexible fibers. These fibers transmit light to the area examined and send images from that area to your caregiver. Discomfort is usually minimal. You may be given a drug to help you sleep (sedative) during or prior to the procedure. This exam helps to detect lumps (tumors), polyps, inflammation, and areas of bleeding. Your caregiver may also take a small piece of tissue (biopsy) that will be examined under a microscope. LET YOUR CAREGIVER KNOW ABOUT:   Allergies to food or medicine.   Medicines taken, including vitamins, herbs, eyedrops, over-the-counter medicines, and creams.   Use of steroids (by mouth or creams).   Previous problems with anesthetics or numbing medicines.   History of bleeding problems or blood clots.   Previous surgery.   Other health problems, including diabetes and kidney problems.   Possibility of pregnancy, if this applies.  BEFORE THE PROCEDURE   A clear liquid diet may be required for 2 days before the exam.   Ask your caregiver about changing or stopping your regular medications.   Liquid injections (enemas) or laxatives may be required.   A large amount of electrolyte solution may be given to you to drink over a short period of time. This solution is used to clean out your colon.   You should be present 60 minutes prior to your procedure or as directed by your caregiver.  AFTER THE PROCEDURE   If you received a sedative or pain relieving medication, you will need to arrange for someone to drive you home.   Occasionally, there is a little blood passed with the first bowel movement. Do not be concerned.  FINDING OUT THE RESULTS OF YOUR TEST Not all test results are available during your visit. If your test  results are not back during the visit, make an appointment with your caregiver to find out the results. Do not assume everything is normal if you have not heard from your caregiver or the medical facility. It is important for you to follow up on all of your test results. HOME CARE INSTRUCTIONS   It is not unusual to pass moderate amounts of gas and experience mild abdominal cramping following the procedure. This is due to air being used to inflate your colon during the exam. Walking or a warm pack on your belly (abdomen) may help.   You may resume all normal meals and activities after sedatives and medicines have worn off.   Only take over-the-counter or prescription medicines for pain, discomfort, or fever as directed by your caregiver. Do not use aspirin or blood thinners if a biopsy was taken. Consult your caregiver for medicine usage if biopsies were taken.  SEEK IMMEDIATE MEDICAL CARE IF:   You have a fever.   You pass large blood clots or fill a toilet with blood following the procedure. This may also occur 10 to 14 days following the procedure. This is more likely if a biopsy was taken.   You develop abdominal pain that keeps getting worse and cannot be relieved with medicine.  Document Released: 07/29/2000 Document Revised: 04/13/2011 Document Reviewed: 03/13/2008 [facility] Patient Information 2012 [place], [place]. Your Colonoscopy is scheduled on 09/26/2011 at 3pm  Separate instructions have been given We sent your MoviPrep to your pharmacy today

## 2011-09-26 ENCOUNTER — Encounter: Payer: Self-pay | Admitting: Gastroenterology

## 2011-09-26 ENCOUNTER — Ambulatory Visit (AMBULATORY_SURGERY_CENTER): Payer: BC Managed Care – PPO | Admitting: Gastroenterology

## 2011-09-26 VITALS — BP 121/68 | HR 70 | Temp 97.7°F | Resp 20 | Ht 67.0 in | Wt 249.0 lb

## 2011-09-26 DIAGNOSIS — D129 Benign neoplasm of anus and anal canal: Secondary | ICD-10-CM

## 2011-09-26 DIAGNOSIS — K625 Hemorrhage of anus and rectum: Secondary | ICD-10-CM

## 2011-09-26 DIAGNOSIS — D128 Benign neoplasm of rectum: Secondary | ICD-10-CM

## 2011-09-26 DIAGNOSIS — D126 Benign neoplasm of colon, unspecified: Secondary | ICD-10-CM

## 2011-09-26 MED ORDER — PRAMOXINE HCL 1 % RE FOAM: Freq: Every day | RECTAL | Status: AC

## 2011-09-26 MED ORDER — SODIUM CHLORIDE 0.9 % IV SOLN: 500.0000 mL | INTRAVENOUS | Status: DC

## 2011-09-26 NOTE — Op Note (Signed)
[facility] [address] [place], [place]  [address]  COLONOSCOPY PROCEDURE REPORT  PATIENT:  Brittney [name], Brittney [name]  MR#:  #[id number] BIRTHDATE:  1980/11/27, 30 yrs. old  GENDER:  female ENDOSCOPIST:  Barbette Hair. Arlyce Dice, MD REF. BY: PROCEDURE DATE:  09/26/2011 PROCEDURE:  Colonoscopy with biopsy ASA CLASS:  Class I INDICATIONS:  rectal bleeding, unexplained diarrhea MEDICATIONS:   MAC sedation, administered by CRNA propofol 400mg IV  DESCRIPTION OF PROCEDURE:   After the risks benefits and alternatives of the procedure were thoroughly explained, informed consent was obtained.  Digital rectal exam was performed and revealed no abnormalities.   The LB 180AL [id number] endoscope was introduced through the anus and advanced to the cecum, which was identified by both the appendix and ileocecal valve, without limitations.  The quality of the prep was excellent, using MoviPrep.  The instrument was then slowly withdrawn as the colon was fully examined. 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 Patches of mildly erythematous and edematous mucosa in rectal vault. ?? mild hypervascularity. Bxs taken (see image8 and image9).  A diminutive polyp was found in the descending colon. It was 2 mm in size. Polyp was snared without cautery. Retrieval was successful (see image4). snare polyp  This was otherwise a normal examination of the colon (see image1, image2, image3, and image7).   Retroflexed views in the rectum revealed no abnormalities.    The time to cecum =  1) 4.25  minutes. The scope was then withdrawn in  1) 9.50  minutes from the cecum and the procedure completed. 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 Otherwise you should continue to follow colorectal cancer screening guidelines for "routine risk" patients with colonoscopy in 10 years. 3) Call office for follow-up appointment in 3 weeks 4) begin proctofoam REPEAT EXAM:  You will receive a letter from Dr. Arlyce Dice in 1-2 weeks, after reviewing the final pathology, with followup recommendations.  ______________________________ Barbette Hair Arlyce Dice, MD  CC:  Judy Pimple, MD  n. Rosalie DoctorBarbette Hair. Kaplan at 09/26/2011 03:27 PM  Jeanell Sparrow, #[id number]

## 2011-09-26 NOTE — Patient Instructions (Signed)
Discharge instructions given with verbal understanding. Handout on polyps given. Call office for follow up appointment in 3 weeks. Resume previous medications.

## 2011-09-26 NOTE — Progress Notes (Signed)
Propofol per [facility]na. See scanned intra procedure report. emw

## 2011-09-26 NOTE — Progress Notes (Signed)
Patient did not experience any of the following events: a burn prior to discharge; a fall within the facility; wrong site/side/patient/procedure/implant event; or a hospital transfer or hospital admission upon discharge from the facility. (G8907) Patient did not have preoperative order for IV antibiotic SSI prophylaxis. (G8918)  

## 2011-09-27 ENCOUNTER — Ambulatory Visit: Payer: BC Managed Care – PPO | Admitting: Gastroenterology

## 2011-09-27 ENCOUNTER — Telehealth: Payer: Self-pay | Admitting: *Deleted

## 2011-09-27 NOTE — Telephone Encounter (Signed)
  Follow up Call-  Call back number 09/26/2011  Post procedure Call Back phone  # [phone]  Permission to leave phone message Yes     Patient questions:  Do you have a fever, pain , or abdominal swelling? no Pain Score  0 *  Have you tolerated food without any problems? yes  Have you been able to return to your normal activities? yes  Do you have any questions about your discharge instructions: Diet   no Medications  no Follow up visit  no  Do you have questions or concerns about your Care? no  Actions: * If pain score is 4 or above: No action needed, pain <4.

## 2011-10-04 ENCOUNTER — Encounter: Payer: Self-pay | Admitting: Gastroenterology

## 2011-10-07 ENCOUNTER — Telehealth: Payer: Self-pay | Admitting: *Deleted

## 2011-10-07 NOTE — Telephone Encounter (Signed)
Medication proctofoam not covered. Sent in script for Anusol Cream... Only 124 co-pay

## 2011-10-10 NOTE — Telephone Encounter (Signed)
ok 

## 2011-10-11 ENCOUNTER — Emergency Department: Payer: Self-pay | Admitting: Emergency Medicine

## 2011-10-11 LAB — BASIC METABOLIC PANEL
Anion Gap: 9 (ref 7–16)
BUN: 10 mg/dL (ref 7–18)
Co2: 26 mmol/L (ref 21–32)
Creatinine: 0.98 mg/dL (ref 0.60–1.30)
EGFR (African American): >60
Osmolality: 281 (ref 275–301)
Sodium: 142 mmol/L (ref 136–145)

## 2011-10-11 LAB — CK TOTAL AND CKMB (NOT AT ARMC)
CK, Total: 102 U/L (ref 21–215)
CK-MB: 1.7 ng/mL (ref 0.5–3.6)

## 2011-10-11 LAB — CBC
HCT: 38.3 % (ref 35.0–47.0)
HGB: 12.3 g/dL (ref 12.0–16.0)
MCHC: 32.1 g/dL (ref 32.0–36.0)
Platelet: 249 10*3/uL (ref 150–440)
RBC: 4.61 10*6/uL (ref 3.80–5.20)
WBC: 6.7 10*3/uL (ref 3.6–11.0)

## 2011-10-25 ENCOUNTER — Ambulatory Visit: Payer: BC Managed Care – PPO | Admitting: Gastroenterology

## 2011-12-19 ENCOUNTER — Encounter (HOSPITAL_COMMUNITY): Payer: Self-pay | Admitting: *Deleted

## 2011-12-19 ENCOUNTER — Emergency Department (INDEPENDENT_AMBULATORY_CARE_PROVIDER_SITE_OTHER)
Admission: EM | Admit: 2011-12-19 | Discharge: 2011-12-19 | Disposition: A | Discharge status: Home / Self Care | Payer: BC Managed Care – PPO | Source: Home / Self Care

## 2011-12-19 DIAGNOSIS — M79605 Pain in left leg: Secondary | ICD-10-CM

## 2011-12-19 DIAGNOSIS — M79609 Pain in unspecified limb: Secondary | ICD-10-CM

## 2011-12-19 NOTE — ED Notes (Signed)
PT  REPORTS    L  CALF  PAIN  WHICH  SHE  REPORTS   BEGAN YEST  PT  DESCIBED=S  THE PAIN AS  AN  ACHING  SHE  WAS  ABLE  TO AMBULATE ON THE  LEG  TO THE  TX  AREA     NO  RECENT     FLIGHT    NO  ORAL  CONRACEPTIVES      DENYS  ANY CHEST PAIN OR   SHORTNESS OF BREATH  SPEAKING IN  COMPLETE  SENTANCES

## 2011-12-19 NOTE — Discharge Instructions (Signed)
Thank you for coming in today.  We will do a blood test to help [place] rule out a DVT in your leg.  We will call tonight or tomorrow if the test is positive for you to go get an ultrasound of your leg.  If you have trouble breathing or chest pain tonight go to the ER.   Call or go to the emergency room if you get worse, have trouble breathing, have chest pains, or palpitations.

## 2011-12-19 NOTE — ED Provider Notes (Signed)
Brittney [name] is a 31 y.o. female who presents to Urgent Care today for left calf pain.  This started yesterday. Patient denies any injury or strain or sprain. She feels completely well otherwise. She went to the gym approximately 5 days ago had no pain until now.  She went to urgent care tonight because she is worried about the possibility of a blood clot. Her brother had a DVT. She denies any trouble breathing chest pain shortness of breath cough and feels well otherwise.   PMH reviewed. Significant for rectal bleeding ROS as above otherwise neg.  no chest pains, palpitations, fevers, chills, abdominal pain nausea or vomiting. Medications reviewed. No current facility-administered medications for this encounter.   Current Outpatient Prescriptions  Medication Sig Dispense Refill  . hydrocortisone (ANUSOL-HC) 2.5 % rectal cream Place rectally 2 (two) times daily.      . SUMAtriptan Succinate (IMITREX PO) Take by mouth as needed.          Exam:  BP 112/80  Pulse 73  Temp(Src) 98.2 F (36.8 C) (Oral)  Resp 18  SpO2 100%  LMP 11/30/2011 Gen: Well NAD Lungs: CTABL Nl WOB Heart: RRR no MRG Exts: Non edematous BL  LE, warm and well perfused. Nontender over calf and knee. Strength is normal with knee flexion without pain.  Calf diameters are equal.  No results found for this or any previous visit (from the past 24 hour(s)). No results found.  Assessment and Plan: 31 y.o. female with likely muscle pain however patient may have a DVT. I feel that the risk for this is very low and that evaluation with D-dimer is warranted. If d-dimer positive will order lower showed a Doppler assessment for DVT. I do not feel that her risk is high enough that the risk of Lovenox with a history of rectal bleeding outweighs the potential benefit.  Discuss with this patient who expresses understanding.     Rodolph Bong, MD 12/19/11 2019

## 2011-12-23 NOTE — ED Provider Notes (Signed)
Dx : L CALF PAIN. DIMER NEG  Medical screening examination/treatment/procedure(s) were performed by the resident and as supervising physician I was immediately available for consultation/collaboration.  Luiz Blare MD   Luiz Blare, MD 12/23/11 1325

## 2012-01-20 ENCOUNTER — Ambulatory Visit (INDEPENDENT_AMBULATORY_CARE_PROVIDER_SITE_OTHER): Payer: BC Managed Care – PPO | Admitting: Family Medicine

## 2012-01-20 ENCOUNTER — Encounter: Payer: Self-pay | Admitting: Family Medicine

## 2012-01-20 VITALS — BP 104/60 | HR 70 | Temp 98.0°F | Ht 66.5 in | Wt 263.0 lb

## 2012-01-20 DIAGNOSIS — T753XXA Motion sickness, initial encounter: Secondary | ICD-10-CM | POA: Insufficient documentation

## 2012-01-20 DIAGNOSIS — K589 Irritable bowel syndrome without diarrhea: Secondary | ICD-10-CM

## 2012-01-20 DIAGNOSIS — G43909 Migraine, unspecified, not intractable, without status migrainosus: Secondary | ICD-10-CM

## 2012-01-20 MED ORDER — ALPRAZOLAM 0.5 MG PO TABS: ORAL_TABLET | ORAL | Status: DC

## 2012-01-20 MED ORDER — HYOSCYAMINE SULFATE ER 0.375 MG PO TB12: 0.3750 mg | ORAL_TABLET | Freq: Two times a day (BID) | ORAL | Status: DC | PRN

## 2012-01-20 MED ORDER — SUMATRIPTAN SUCCINATE 100 MG PO TABS: 100.0000 mg | ORAL_TABLET | ORAL | Status: DC | PRN

## 2012-01-20 MED ORDER — SCOPOLAMINE 1 MG/3DAYS TD PT72: 1.0000 | MEDICATED_PATCH | TRANSDERMAL | Status: DC

## 2012-01-20 NOTE — Progress Notes (Signed)
Subjective:    Patient ID: Brittney [name], female    DOB: [birth year], 31 y.o.   MRN: [id number]  HPI Here to disc tx for motion sickness for upcoming cruise and also refils of some medicines   Wt is up 14 lb with bmi of 41   Is getting ready to go on a cruise -- end of the month To the [place]  First time flying and going on a cruise   Needs xanax for the plane- is very nervous   Is interested in the scopalamine patch for the cruise   Does not have to take her levbid very often- is doing well with IBS but wants to make sure she is up to date on that and migraine med  No migraine in a while   Patient Active Problem List  Diagnoses  . OBESITY  . PANIC ATTACK  . MIGRAINE HEADACHE  . HEARING LOSS, MILD  . IBS  . RECTAL BLEEDING  . RECTAL PAIN  . HEMATURIA UNSPECIFIED  . OSTEOARTHRITIS  . EDEMA  . WEIGHT GAIN  . Vaginal discharge  . UTI in pregnancy  . Postpartum state  . Headache  . Motion sickness   Past Medical History  Diagnosis Date  . Irritable bowel syndrome   . Obesity, unspecified   . Fibroids   . Migraine    Past Surgical History  Procedure Date  . Colonoscopy 11/2001    polyp; negative pathology  . Colonoscopy 09/2004    Negative  . Uterine US 09/2005    Large endometrial stripe; small ovarian cyst  . Laparoscopic decortication / dubulking / ablation renal cysts     saline histogram  . Tonsillectomy 07/30/07  . Esophagogastroduodenoscopy 1/10    normal  . Colonoscopy w/ polypectomy   . Cholecystectomy 05/06/11   History  Substance Use Topics  . Smoking status: Never Smoker   . Smokeless tobacco: Never Used  . Alcohol Use: No     rare   Family History  Problem Relation Age of Onset  . Ulcers Brother   . Colon cancer Maternal Grandmother   . Ovarian cancer Maternal Grandmother   . Cancer Maternal Grandmother     colon, ovarian  . Obesity      Paternal side  . Hypertension      Maternal side/Paternal side  . Diabetes Paternal  Grandmother   . Hypertension Paternal Grandmother   . Clotting disorder Brother   . Diabetes Paternal Aunt   . Heart disease Father   . Hypertension Father   . Breast cancer Maternal Aunt    Allergies  Allergen Reactions  . Lansoprazole     REACTION: rash   Current Outpatient Prescriptions on File Prior to Visit  Medication Sig Dispense Refill  . hydrocortisone (ANUSOL-HC) 2.5 % rectal cream Place rectally 2 (two) times daily as needed.       . hyoscyamine (LEVBID) 0.375 MG 12 hr tablet Take 0.375 mg by mouth every 12 (twelve) hours as needed.      . SUMAtriptan Succinate (IMITREX PO) Take 100 mg by mouth as needed.            Review of Systems Review of Systems  Constitutional: Negative for fever, appetite change, fatigue and unexpected weight change.  Eyes: Negative for pain and visual disturbance.  Respiratory: Negative for cough and shortness of breath.   Cardiovascular: Negative for cp or palpitations    Gastrointestinal: Negative for nausea, diarrhea and constipation.  Genitourinary: Negative for urgency  and frequency.  Skin: Negative for pallor or rash   Neurological: Negative for weakness, light-headedness, numbness and headaches.  Hematological: Negative for adenopathy. Does not bruise/bleed easily.  Psychiatric/Behavioral: Negative for dysphoric mood. The patient is not nervous/anxious.          Objective:   Physical Exam  Constitutional: She appears well-developed and well-nourished. No distress.       Obese and well appearing   HENT:  Head: Normocephalic and atraumatic.  Right Ear: External ear normal.  Left Ear: External ear normal.  Mouth/Throat: Oropharynx is clear and moist.  Eyes: Conjunctivae and EOM are normal. Pupils are equal, round, and reactive to light. No scleral icterus.  Neck: Normal range of motion. Neck supple. Carotid bruit is not present. No thyromegaly present.  Cardiovascular: Normal rate, regular rhythm, normal heart sounds and intact  distal pulses.  Exam reveals no gallop.   Pulmonary/Chest: Effort normal and breath sounds normal. No respiratory distress. She has no wheezes.  Abdominal: Soft. Bowel sounds are normal. She exhibits no distension, no abdominal bruit and no mass. There is no tenderness.  Musculoskeletal: She exhibits no edema.  Lymphadenopathy:    She has no cervical adenopathy.  Neurological: She is alert. She has normal reflexes. No cranial nerve deficit. She exhibits normal muscle tone. Coordination normal.  Skin: Skin is warm and dry. No rash noted. No erythema. No pallor.  Psychiatric: She has a normal mood and affect.          Assessment & Plan:

## 2012-01-20 NOTE — Patient Instructions (Signed)
Have a great trip Try the patch for motion sickness  Take the xanax about 1 hour before getting on the plane  Don't forget the sun screen

## 2012-01-22 NOTE — Assessment & Plan Note (Signed)
Doing well with many less headaches imitrex refilled for prn use

## 2012-01-22 NOTE — Assessment & Plan Note (Signed)
Getting ready for cruise and pt is prone to this  Disc use of scopalamine patch and possible side eff  Will not take at same time as levbid

## 2012-01-22 NOTE — Assessment & Plan Note (Signed)
Has been better with much less need for levbid It was refilled for prn use

## 2012-02-17 ENCOUNTER — Telehealth: Payer: Self-pay | Admitting: Gastroenterology

## 2012-02-17 LAB — COMPREHENSIVE METABOLIC PANEL
Albumin: 3.6 g/dL (ref 3.4–5.0)
Anion Gap: 6 — ABNORMAL LOW (ref 7–16)
Bilirubin,Total: 0.2 mg/dL (ref 0.2–1.0)
Calcium, Total: 9.1 mg/dL (ref 8.5–10.1)
Co2: 27 mmol/L (ref 21–32)
EGFR (African American): >60
Glucose: 80 mg/dL (ref 65–99)
Potassium: 4.2 mmol/L (ref 3.5–5.1)
SGOT(AST): 19 U/L (ref 15–37)
SGPT (ALT): 22 U/L
Sodium: 140 mmol/L (ref 136–145)
Total Protein: 7.6 g/dL (ref 6.4–8.2)

## 2012-02-17 LAB — URINALYSIS, COMPLETE
Ketone: NEGATIVE
Leukocyte Esterase: NEGATIVE
Nitrite: NEGATIVE
Ph: 7 (ref 4.5–8.0)
Protein: NEGATIVE
RBC,UR: <1 /HPF (ref 0–5)
Squamous Epithelial: <1

## 2012-02-17 LAB — CBC
HCT: 37.5 % (ref 35.0–47.0)
MCHC: 31.7 g/dL — ABNORMAL LOW (ref 32.0–36.0)
Platelet: 266 10*3/uL (ref 150–440)
RBC: 4.64 10*6/uL (ref 3.80–5.20)
WBC: 9.1 10*3/uL (ref 3.6–11.0)

## 2012-02-17 LAB — PREGNANCY, URINE: Pregnancy Test, Urine: NEGATIVE m[IU]/mL

## 2012-02-17 LAB — PROTIME-INR: Prothrombin Time: 12.8 secs (ref 11.5–14.7)

## 2012-02-17 LAB — APTT: Activated PTT: 29.8 secs (ref 23.6–35.9)

## 2012-02-17 LAB — LIPASE, BLOOD: Lipase: 170 U/L (ref 73–393)

## 2012-02-17 NOTE — Telephone Encounter (Signed)
Patient calling to report she had some stomach pain and cramping yesterday in the belly button area. Today, she is having stomach pain, cramping and had diarrhea. Denies vomiting or fever. She went to the bathroom and had a blood clot on the tissue. She had another stool and it had burgundy blood in it and on top of it. She states she had multiple diarrhea stools this AM. Patient states she thinks she had "ulcers" when she was younger. Patient instructed to go to ED for evaluation.

## 2012-02-18 ENCOUNTER — Observation Stay: Payer: Self-pay | Admitting: Internal Medicine

## 2012-02-18 LAB — CBC WITH DIFFERENTIAL/PLATELET
Basophil %: 0.4 %
Eosinophil #: 0.1 10*3/uL (ref 0.0–0.7)
Eosinophil %: 1.4 %
HCT: 34.9 % — ABNORMAL LOW (ref 35.0–47.0)
HGB: 11.1 g/dL — ABNORMAL LOW (ref 12.0–16.0)
MCH: 25.4 pg — ABNORMAL LOW (ref 26.0–34.0)
MCHC: 31.8 g/dL — ABNORMAL LOW (ref 32.0–36.0)
MCV: 80 fL (ref 80–100)
Monocyte #: 0.6 x10 3/mm (ref 0.2–0.9)
Monocyte %: 9.1 %
Neutrophil #: 4 10*3/uL (ref 1.4–6.5)
WBC: 7 10*3/uL (ref 3.6–11.0)

## 2012-02-18 NOTE — Telephone Encounter (Signed)
Reviewed, agree with pt being evaluated in ED. Suspect ischemic colitis

## 2012-02-19 LAB — BASIC METABOLIC PANEL
Anion Gap: 8 (ref 7–16)
BUN: 5 mg/dL — ABNORMAL LOW (ref 7–18)
Calcium, Total: 8.3 mg/dL — ABNORMAL LOW (ref 8.5–10.1)
EGFR (African American): >60
EGFR (Non-African Amer.): >60
Glucose: 90 mg/dL (ref 65–99)
Osmolality: 274 (ref 275–301)
Sodium: 139 mmol/L (ref 136–145)

## 2012-02-19 LAB — CBC WITH DIFFERENTIAL/PLATELET
Basophil %: 0.3 %
Eosinophil #: 0.1 10*3/uL (ref 0.0–0.7)
Eosinophil %: 0.9 %
HCT: 35.8 % (ref 35.0–47.0)
HGB: 11.4 g/dL — ABNORMAL LOW (ref 12.0–16.0)
Lymphocyte #: 1.3 10*3/uL (ref 1.0–3.6)
Lymphocyte %: 21.2 %
MCV: 81 fL (ref 80–100)
Monocyte %: 7.7 %
Neutrophil #: 4.3 10*3/uL (ref 1.4–6.5)
RBC: 4.41 10*6/uL (ref 3.80–5.20)
WBC: 6.2 10*3/uL (ref 3.6–11.0)

## 2012-02-19 LAB — OCCULT BLOOD X 1 CARD TO LAB, STOOL: Occult Blood, Feces: NEGATIVE

## 2012-02-20 ENCOUNTER — Encounter: Payer: Self-pay | Admitting: Family Medicine

## 2012-02-20 ENCOUNTER — Ambulatory Visit (INDEPENDENT_AMBULATORY_CARE_PROVIDER_SITE_OTHER): Payer: BC Managed Care – PPO | Admitting: Family Medicine

## 2012-02-20 VITALS — BP 108/64 | HR 68 | Temp 98.0°F | Wt 264.2 lb

## 2012-02-20 DIAGNOSIS — K921 Melena: Secondary | ICD-10-CM

## 2012-02-20 DIAGNOSIS — M8538 Osteitis condensans, other site: Secondary | ICD-10-CM | POA: Insufficient documentation

## 2012-02-20 DIAGNOSIS — M8530 Osteitis condensans, unspecified site: Secondary | ICD-10-CM

## 2012-02-20 DIAGNOSIS — R109 Unspecified abdominal pain: Secondary | ICD-10-CM | POA: Insufficient documentation

## 2012-02-20 LAB — CBC WITH DIFFERENTIAL/PLATELET
Eosinophils Relative: 1.4 % (ref 0.0–5.0)
HCT: 36.4 % (ref 36.0–46.0)
Hemoglobin: 11.6 g/dL — ABNORMAL LOW (ref 12.0–15.0)
Lymphs Abs: 2.1 10*3/uL (ref 0.7–4.0)
MCV: 81.1 fl (ref 78.0–100.0)
Monocytes Absolute: 0.6 10*3/uL (ref 0.1–1.0)
Neutro Abs: 5.1 10*3/uL (ref 1.4–7.7)
Platelets: 266 10*3/uL (ref 150.0–400.0)
WBC: 8 10*3/uL (ref 4.5–10.5)

## 2012-02-20 NOTE — Patient Instructions (Addendum)
Start the ranitidine Update me if symptoms return We will do referral to Dr Arlyce Dice at check out  Keep your diet somewhat bland -and with small portions  Lab today for anemia  You will need a follow up MRI of pelvis without contrast in 3-6 months to watch lesion on ilium (hip bone)

## 2012-02-20 NOTE — Progress Notes (Signed)
Subjective:    Patient ID: Brittney [name], female    DOB: 1980/12/08, 31 y.o.   MRN: [id number]  HPI Here for f/u hosp [facility]  She went to ER - was cramping and abd pain and dark blood in stool  She called GI office and told her to go to ER Did not feel like her IBS at that time Pain was in the middle  Her H/H was low -- and never needed a transfusion (lowest hb was 11.1)- also has hx of heavy menses She is feeling tired - ? If needs iron   Did a CT and MRI -- (lesion in pelvic area) -- wanted another CT in 4-6 months to re image (indeterminate pelvic lesion)  Was described as ilial lesion and MRI of pelvis in 3-6 mo is recommended without contrast to watch it (on CT was desc as sclerotic lesion) No infectoin? -unsure -was given abx by IV  Did not have any endocsopy or colonosc  Stool sample yesterday was ok -no blood  Feels very tired now   Wanted her to f/u with me  And also GI- Dr Arlyce Dice  Never really knew what happened   No more blood  No more pain   Is supposed to start raniditine today  Last colonosc was 2/12- showed one small polyp and proctitis  Her ccy was this fall  Patient Active Problem List  Diagnosis  . OBESITY  . PANIC ATTACK  . MIGRAINE HEADACHE  . HEARING LOSS, MILD  . IBS  . RECTAL BLEEDING  . RECTAL PAIN  . HEMATURIA UNSPECIFIED  . OSTEOARTHRITIS  . EDEMA  . WEIGHT GAIN  . Vaginal discharge  . UTI in pregnancy  . Postpartum state  . Headache  . Motion sickness   Past Medical History  Diagnosis Date  . Irritable bowel syndrome   . Obesity, unspecified   . Fibroids   . Migraine    Past Surgical History  Procedure Date  . Colonoscopy 11/2001    polyp; negative pathology  . Colonoscopy 09/2004    Negative  . Uterine US 09/2005    Large endometrial stripe; small ovarian cyst  . Laparoscopic decortication / dubulking / ablation renal cysts     saline histogram  . Tonsillectomy 07/30/07  . Esophagogastroduodenoscopy 1/10    normal    . Colonoscopy w/ polypectomy   . Cholecystectomy 05/06/11   History  Substance Use Topics  . Smoking status: Never Smoker   . Smokeless tobacco: Never Used  . Alcohol Use: No     rare   Family History  Problem Relation Age of Onset  . Ulcers Brother   . Colon cancer Maternal Grandmother   . Ovarian cancer Maternal Grandmother   . Cancer Maternal Grandmother     colon, ovarian  . Obesity      Paternal side  . Hypertension      Maternal side/Paternal side  . Diabetes Paternal Grandmother   . Hypertension Paternal Grandmother   . Clotting disorder Brother   . Diabetes Paternal Aunt   . Heart disease Father   . Hypertension Father   . Breast cancer Maternal Aunt    Allergies  Allergen Reactions  . Lansoprazole     REACTION: rash   Current Outpatient Prescriptions on File Prior to Visit  Medication Sig Dispense Refill  . hydrocortisone (ANUSOL-HC) 2.5 % rectal cream Place rectally 2 (two) times daily as needed.       . hyoscyamine (LEVBID) 0.375 MG  12 hr tablet Take 1 tablet (0.375 mg total) by mouth every 12 (twelve) hours as needed for cramping.  60 tablet  3  . ranitidine (ZANTAC) 150 MG capsule Take 150 mg by mouth 2 (two) times daily.      . SUMAtriptan (IMITREX) 100 MG tablet Take 1 tablet (100 mg total) by mouth every 2 (two) hours as needed for migraine.  10 tablet  3  . DISCONTD: SUMAtriptan Succinate (IMITREX PO) Take 100 mg by mouth as needed.       . ALPRAZolam (XANAX) 0.5 MG tablet Take one pill one hour before boarding airplane as needed  5 tablet  0  . scopolamine (TRANSDERM-SCOP) 1.5 MG Place 1 patch (1.5 mg total) onto the skin every 3 (three) days.  4 patch  0  . DISCONTD: hyoscyamine (LEVBID) 0.375 MG 12 hr tablet Take 0.375 mg by mouth every 12 (twelve) hours as needed.          Review of Systems Review of Systems  Constitutional: Negative for fever, appetite change, and unexpected weight change. pos for fatigue  Eyes: Negative for pain and visual  disturbance.  Respiratory: Negative for cough and shortness of breath.   Cardiovascular: Negative for cp or palpitations    Gastrointestinal: Negative for nausea, diarrhea and constipation. neg for blood in stool, pos for scant residual tenderness in abd that is now a lot better  Genitourinary: Negative for urgency and frequency.  Skin: Negative for pallor or rash   Neurological: Negative for weakness, light-headedness, numbness and headaches.  Hematological: Negative for adenopathy. Does not bruise/bleed easily.  Psychiatric/Behavioral: Negative for dysphoric mood. The patient is not nervous/anxious.         Objective:   Physical Exam  Constitutional: She appears well-developed and well-nourished. No distress.  HENT:  Head: Normocephalic and atraumatic.  Mouth/Throat: Oropharynx is clear and moist.  Eyes: Conjunctivae and EOM are normal. Pupils are equal, round, and reactive to light. No scleral icterus.  Neck: Normal range of motion. Neck supple. No JVD present. Carotid bruit is not present. No thyromegaly present.  Cardiovascular: Normal rate, regular rhythm, normal heart sounds and intact distal pulses.  Exam reveals no gallop.   Pulmonary/Chest: Effort normal and breath sounds normal. No respiratory distress. She has no wheezes.  Abdominal: Soft. Bowel sounds are normal. She exhibits no distension and no mass. There is no hepatosplenomegaly. There is tenderness. There is no rebound, no guarding and negative Murphy's sign.       Mild peri umbilical tenderness without rebound or gaurding  Musculoskeletal: She exhibits no edema and no tenderness.  Lymphadenopathy:    She has no cervical adenopathy.  Neurological: She is alert. She has normal reflexes.  Skin: Skin is warm and dry. No rash noted. No erythema. No pallor.  Psychiatric: She has a normal mood and affect.          Assessment & Plan:

## 2012-02-20 NOTE — Assessment & Plan Note (Signed)
This is currently resolved after hosp at [facility] for this and abd pain Mildly anemic in hosp 11.1 hb (also heavy menses) Re check this today Set up f/u with GI Last colonosc 2/13 showed proctitis

## 2012-02-20 NOTE — Assessment & Plan Note (Signed)
Area of sclerosis seen on MRI (incidental)   , radiologist recommends f/u MRI in 3-6 mo for this Asymptomatic

## 2012-02-20 NOTE — Assessment & Plan Note (Addendum)
Pt s/p hosp at [facility] with abd pain and blood in stool (hx of proctitis on last colonosc in 2/13) CT nl MRI showed sclerotic lesion on ilium  Was proph tx with abx/ feels better/ mildly anemic  Cbc today Set up f/u with her GI doctor Update if not starting to improve in a week or if worsening    Addendum 1:43 - records obt from [facility] and reviewed in detail

## 2012-02-21 LAB — STOOL CULTURE

## 2012-02-29 ENCOUNTER — Encounter: Payer: Self-pay | Admitting: Gastroenterology

## 2012-02-29 ENCOUNTER — Ambulatory Visit (INDEPENDENT_AMBULATORY_CARE_PROVIDER_SITE_OTHER): Payer: BC Managed Care – PPO | Admitting: Gastroenterology

## 2012-02-29 VITALS — BP 98/70 | HR 88 | Ht 66.75 in | Wt 261.0 lb

## 2012-02-29 DIAGNOSIS — D126 Benign neoplasm of colon, unspecified: Secondary | ICD-10-CM

## 2012-02-29 DIAGNOSIS — K625 Hemorrhage of anus and rectum: Secondary | ICD-10-CM

## 2012-02-29 NOTE — Patient Instructions (Addendum)
Flexible Sigmoidoscopy Your caregiver has ordered a flexible sigmoidoscopy. This is an exam to evaluate your lower colon. In this exam your colon is cleansed and a short fiber optic tube is inserted through your rectum and into your colon. The fiber optic scope (endoscope) is a short bundle of enclosed flexible small glass fibers. It transmits light to the area examined and images from that area to your caregiver. You do not have to worry about glass breakage in the endoscope. Discomfort is usually minimal. Sedatives and pain medications are generally not required. This exam helps to detect tumors (lumps), polyps, inflammation (swelling and soreness), and areas of bleeding. It may also be used to take biopsies. These are small pieces of tissue taken to examine under a microscope. LET YOUR CAREGIVER KNOW ABOUT:  Allergies.   Medications taken including herbs, eye drops, over the counter medications, and creams.   Use of steroids (by mouth or creams).   Previous problems with anesthetics or novocaine   Possibility of pregnancy, if this applies.   History of blood clots (thrombophlebitis).   History of bleeding or blood problems.   Previous surgery.   Other health problems.  BEFORE THE PROCEDURE Eat normally the night before the exam. Your caregiver may order a mild enema or laxative the night before. No eating or drinking should occur after midnight until the procedure is completed. A rectal suppository or enemas may be given in the morning prior to your procedure. You will be brought to the examination area in a hospital gown. You should be present 60 minutes prior to your procedure or as directed.  AFTER THE PROCEDURE   There is sometimes a little blood passed with the first bowel movement. Do not be concerned. Because air is often used during the exam, it is not unusual to pass gas and experience abdominal (belly) cramping. Walking or a warm pack on your abdomen may help with this. Do not  sleep with a heating pad as burns can occur.   You may resume all normal eating and activities.   Only take over-the-counter or prescription medicines for pain, discomfort, or fever as directed by your caregiver. Do not use aspirin or blood thinners if a biopsy (tissue sample) was taken. Consult your caregiver for medication usage if biopsies were taken.   Call for your results as instructed by your caregiver. Remember, it is your responsibility to obtain the results of your biopsy. Do not assume everything is fine because you do not hear from your caregiver.  SEEK IMMEDIATE MEDICAL CARE IF:  An oral temperature above 102 F (38.9 C) develops.   You pass large blood clots or fill a toilet with blood following the procedure. This may also occur 10 to 14 days following the procedure. It is more likely if a biopsy was taken.   You develop abdominal pain not relieved with medication or that is getting worse rather than better.  Document Released: 07/29/2000 Document Revised: 07/21/2011 Document Reviewed: 05/11/2005 [employer] Patient Information 2012 [employer]. 

## 2012-02-29 NOTE — Assessment & Plan Note (Signed)
Plan follow-up colonoscopy in 2018 

## 2012-02-29 NOTE — Progress Notes (Signed)
History of Present Illness:  The patient returns for evaluation of recurrent rectal bleeding and diarrhea. In February, 2013 she underwent colonoscopy which demonstrated a small adenomatous polyp.  There was mild erythema in the rectal vault and question of proctitis. Biopsies were negative, however. She's given ProctoFoam with resolution of her symptoms. She was well until approximately 10 days ago when she developed lower crampy abdominal pain, watery diarrhea and passage of burgundy stool. She was admitted to [facility] where, by the patient's report, CT scan was negative. She was discharged in 48 hours. Bleeding has stopped but she continues to have 4-5 loose stools daily. She symptoms in November, 2012. She complains of mild crampy lower abdominal pain.    Review of Systems: Pertinent positive and negative review of systems were noted in the above HPI section. All other review of systems were otherwise negative.    Current Medications, Allergies, Past Medical History, Past Surgical History, Family History and Social History were reviewed in [facility] electronic medical record  Vital signs were reviewed in today's medical record. Physical Exam: General: Well developed , well nourished, no acute distress Head: Normocephalic and atraumatic Eyes:  sclerae anicteric, EOMI Ears: Normal auditory acuity Mouth: No deformity or lesions Lungs: Clear throughout to auscultation Heart: Regular rate and rhythm; no murmurs, rubs or bruits Abdomen: Soft,  and non distended. No masses, hepatosplenomegaly or hernias noted. Normal Bowel sounds. There is minimal tenderness to palpation in the left and right lower quadrants Rectal:deferred Musculoskeletal: Symmetrical with no gross deformities  Pulses:  Normal pulses noted Extremities: No clubbing, cyanosis, edema or deformities noted Neurological: Alert oriented x 4, grossly nonfocal Psychological:  Alert and cooperative. Normal mood and  affect    

## 2012-02-29 NOTE — Assessment & Plan Note (Addendum)
The patient has had several discrete episodes of rectal bleeding accompanied by diarrhea. Symptoms are compatible with proctitis or colitis although this has not been verified by her recent colonoscopy which only suggested proctitis.  Recommendations #1review recent hospital records at [facility] #2 sigmoidoscopy

## 2012-03-05 ENCOUNTER — Ambulatory Visit (HOSPITAL_COMMUNITY)
Admission: RE | Admit: 2012-03-05 | Discharge: 2012-03-05 | Disposition: A | Discharge status: Home / Self Care | Payer: BC Managed Care – PPO | Source: Ambulatory Visit | Attending: Gastroenterology | Admitting: Gastroenterology

## 2012-03-05 ENCOUNTER — Encounter (HOSPITAL_COMMUNITY)
Admission: RE | Disposition: A | Discharge status: Home / Self Care | Payer: Self-pay | Source: Ambulatory Visit | Attending: Gastroenterology

## 2012-03-05 ENCOUNTER — Encounter (HOSPITAL_COMMUNITY): Payer: Self-pay | Admitting: Gastroenterology

## 2012-03-05 DIAGNOSIS — K921 Melena: Secondary | ICD-10-CM

## 2012-03-05 DIAGNOSIS — K625 Hemorrhage of anus and rectum: Secondary | ICD-10-CM | POA: Insufficient documentation

## 2012-03-05 DIAGNOSIS — K648 Other hemorrhoids: Secondary | ICD-10-CM | POA: Insufficient documentation

## 2012-03-05 HISTORY — PX: FLEXIBLE SIGMOIDOSCOPY: SHX5431

## 2012-03-05 SURGERY — SIGMOIDOSCOPY, FLEXIBLE
Anesthesia: Moderate Sedation

## 2012-03-05 MED ORDER — MIDAZOLAM HCL 10 MG/2ML IJ SOLN
INTRAMUSCULAR | Status: AC
  Filled 2012-03-05: qty 2

## 2012-03-05 MED ORDER — SODIUM CHLORIDE 0.9 % IV SOLN
INTRAVENOUS | Status: DC
  Administered 2012-03-05: 500 mL via INTRAVENOUS

## 2012-03-05 MED ORDER — MIDAZOLAM HCL 10 MG/2ML IJ SOLN
INTRAMUSCULAR | Status: DC | PRN
  Administered 2012-03-05 (×2): 2 mg via INTRAVENOUS
  Administered 2012-03-05: 1 mg via INTRAVENOUS
  Administered 2012-03-05 (×2): 2 mg via INTRAVENOUS

## 2012-03-05 MED ORDER — FENTANYL CITRATE 0.05 MG/ML IJ SOLN
INTRAMUSCULAR | Status: DC | PRN
  Administered 2012-03-05 (×4): 25 ug via INTRAVENOUS

## 2012-03-05 MED ORDER — FENTANYL CITRATE 0.05 MG/ML IJ SOLN
INTRAMUSCULAR | Status: AC
  Filled 2012-03-05: qty 2

## 2012-03-05 MED ORDER — HYDROCORTISONE ACETATE 25 MG RE SUPP: 25.0000 mg | Freq: Two times a day (BID) | RECTAL | Status: DC

## 2012-03-05 NOTE — Interval H&P Note (Signed)
History and Physical Interval Note:  03/05/2012 9:56 AM  Brittney [name]  has presented today for surgery, with the diagnosis of rectal bleeding  The various methods of treatment have been discussed with the patient and family. After consideration of risks, benefits and other options for treatment, the patient has consented to  Procedure(s) (LRB): FLEXIBLE SIGMOIDOSCOPY (N/A) as a surgical intervention .  The patient's history has been reviewed, patient examined, no change in status, stable for surgery.  I have reviewed the patient's chart and labs.  Questions were answered to the patient's satisfaction.     The recent H&P (dated **02/29/12 *) was reviewed, the patient was examined and there is no change in the patients condition since that H&P was completed.   Brittney [name]  03/05/2012, 9:56 AM   Brittney [name]

## 2012-03-05 NOTE — H&P (View-Only) (Signed)
History of Present Illness:  The patient returns for evaluation of recurrent rectal bleeding and diarrhea. In February, 2013 she underwent colonoscopy which demonstrated a small adenomatous polyp.  There was mild erythema in the rectal vault and question of proctitis. Biopsies were negative, however. She's given ProctoFoam with resolution of her symptoms. She was well until approximately 10 days ago when she developed lower crampy abdominal pain, watery diarrhea and passage of burgundy stool. She was admitted to [facility] where, by the patient's report, CT scan was negative. She was discharged in 48 hours. Bleeding has stopped but she continues to have 4-5 loose stools daily. She symptoms in November, 2012. She complains of mild crampy lower abdominal pain.    Review of Systems: Pertinent positive and negative review of systems were noted in the above HPI section. All other review of systems were otherwise negative.    Current Medications, Allergies, Past Medical History, Past Surgical History, Family History and Social History were reviewed in [employer] [facility] electronic medical record  Vital signs were reviewed in today's medical record. Physical Exam: General: Well developed , well nourished, no acute distress Head: Normocephalic and atraumatic Eyes:  sclerae anicteric, EOMI Ears: Normal auditory acuity Mouth: No deformity or lesions Lungs: Clear throughout to auscultation Heart: Regular rate and rhythm; no murmurs, rubs or bruits Abdomen: Soft,  and non distended. No masses, hepatosplenomegaly or hernias noted. Normal Bowel sounds. There is minimal tenderness to palpation in the left and right lower quadrants Rectal:deferred Musculoskeletal: Symmetrical with no gross deformities  Pulses:  Normal pulses noted Extremities: No clubbing, cyanosis, edema or deformities noted Neurological: Alert oriented x 4, grossly nonfocal Psychological:  Alert and cooperative. Normal mood and  affect

## 2012-03-05 NOTE — Op Note (Signed)
[facility] [address] [place], [place]  [address]  FLEXIBLE SIGMOIDOSCOPY PROCEDURE REPORT  PATIENT:  Brittney [name], Brittney [name]  MR#:  [id number] BIRTHDATE:  June 02, 1981, 30 yrs. old  GENDER:  female  ENDOSCOPIST:  Barbette Hair. Arlyce Dice, MD Referred by:  PROCEDURE DATE:  03/05/2012 PROCEDURE:  Flexible Sigmoidoscopy, diagnostic ASA CLASS:  Class II INDICATIONS:  rectal bleeding  MEDICATIONS:   These medications were titrated to patient response per physician's verbal order, Fentanyl 100 mcg IV, Versed 9 mg IV  DESCRIPTION OF PROCEDURE:   After the risks benefits and alternatives of the procedure were thoroughly explained, informed consent was obtained.  Digital rectal exam was performed and revealed no abnormalities.   The  endoscope was introduced through the anus and advanced to the descending colon, without limitations.  The quality of the prep was .  The instrument was then slowly withdrawn as the mucosa was fully examined. <<PROCEDUREIMAGES>>  Internal hemorrhoids were found (see image5).  The examination was otherwise normal (see image2).   Retroflexed views in the rectum revealed no abnormalities.    The scope was then withdrawn from the patient and the procedure terminated.  COMPLICATIONS:  None  ENDOSCOPIC IMPRESSION: 1) Internal hemorrhoids 2) Otherwise normal examination.  Limited rectal bleeding is likely hemorrhoidal  RECOMMENDATIONS: Fiber supplementation Anusol HC suppositories  REPEAT EXAM:  No  ______________________________ Barbette Hair. Arlyce Dice, MD  CC:  Judy Pimple, MD  n. Rosalie DoctorBarbette Hair. Arjun Hard at 03/05/2012 10:20 AM  Jeanell Sparrow, [phone]

## 2012-03-06 ENCOUNTER — Encounter (HOSPITAL_COMMUNITY): Payer: Self-pay | Admitting: Gastroenterology

## 2012-04-17 ENCOUNTER — Other Ambulatory Visit: Payer: BC Managed Care – PPO | Admitting: Gastroenterology

## 2012-04-24 ENCOUNTER — Encounter: Payer: Self-pay | Admitting: Family Medicine

## 2012-04-24 ENCOUNTER — Ambulatory Visit (INDEPENDENT_AMBULATORY_CARE_PROVIDER_SITE_OTHER): Payer: BC Managed Care – PPO | Admitting: Family Medicine

## 2012-04-24 ENCOUNTER — Telehealth: Payer: Self-pay | Admitting: Family Medicine

## 2012-04-24 VITALS — BP 130/68 | HR 80 | Temp 98.4°F | Wt 266.0 lb

## 2012-04-24 DIAGNOSIS — J029 Acute pharyngitis, unspecified: Secondary | ICD-10-CM

## 2012-04-24 DIAGNOSIS — J069 Acute upper respiratory infection, unspecified: Secondary | ICD-10-CM

## 2012-04-24 LAB — POCT RAPID STREP A (OFFICE): Rapid Strep A Screen: NEGATIVE

## 2012-04-24 NOTE — Telephone Encounter (Signed)
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

## 2012-04-24 NOTE — Progress Notes (Signed)
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  Sx started a few days ago for patient.  ST initially, voice was hoarse last PM and this AM. No fevers known. Some chest congestion, since yesterday.  No vomiting. Some diarrhea yesterday, nonbloody. HA last night.    Meds, vitals, and allergies reviewed.   ROS: See HPI.  Otherwise, noncontributory.  GEN: nad, alert and oriented HEENT: mucous membranes moist, tm w/o erythema, nasal exam w/o erythema, clear discharge noted,  OP with cobblestoning NECK: supple w/o LA CV: rrr.   PULM: ctab, no inc wob EXT: no edema SKIN: no acute rash  RST neg.

## 2012-04-24 NOTE — Patient Instructions (Addendum)
Drink plenty of fluids, take tylenol or ibuprofen as needed, and gargle with warm salt water for your throat.  Rest your voice, limit talking.  This should gradually improve.  Take care.  Let us know if you have other concerns.

## 2012-04-25 DIAGNOSIS — J069 Acute upper respiratory infection, unspecified: Secondary | ICD-10-CM | POA: Insufficient documentation

## 2012-04-25 NOTE — Assessment & Plan Note (Signed)
Likely viral, supportive care and f/u prn.  She agrees.  Nontoxic.

## 2012-05-08 ENCOUNTER — Encounter: Payer: BC Managed Care – PPO | Admitting: Obstetrics and Gynecology

## 2012-05-16 ENCOUNTER — Encounter: Payer: BC Managed Care – PPO | Admitting: Obstetrics and Gynecology

## 2012-05-23 ENCOUNTER — Ambulatory Visit (INDEPENDENT_AMBULATORY_CARE_PROVIDER_SITE_OTHER): Payer: BC Managed Care – PPO | Admitting: Obstetrics and Gynecology

## 2012-05-23 ENCOUNTER — Encounter: Payer: Self-pay | Admitting: Obstetrics and Gynecology

## 2012-05-23 ENCOUNTER — Encounter: Payer: BC Managed Care – PPO | Admitting: Obstetrics and Gynecology

## 2012-05-23 VITALS — BP 124/78 | Ht 68.0 in | Wt 261.0 lb

## 2012-05-23 DIAGNOSIS — Z01419 Encounter for gynecological examination (general) (routine) without abnormal findings: Secondary | ICD-10-CM

## 2012-05-23 LAB — CBC
Hemoglobin: 12.3 g/dL (ref 12.0–15.0)
RBC: 4.82 MIL/uL (ref 3.87–5.11)
WBC: 6.5 10*3/uL (ref 4.0–10.5)

## 2012-05-23 NOTE — Progress Notes (Signed)
Patient came to see me today for her annual GYN exam. She has now had 2 children. She is having extremely heavy cycles. They  last 5 days. 3-4 days are extremely heavy requiring frequent changing of pads. She is using condoms for birth control. Prior to her last pregnancy she used a Mirena IUD with almost complete amenorrhea but she did have weight gain with it. When she was having trouble conceiving an her HSG showed a normal endometrial cavity and she's also had a SIH done at a different time for menorrhagia with a normal endometrial cavity. She is fairly sure that she does not want more children but not 100%. She has always had normal Pap smears. Her last Pap smear was 2012. She had a maternal aunt who had breast cancer in her late 46s and was treated [place]. She is still alive. She also had a maternal first cousin who had breast cancer in her late 12s and is deceased. She was told this summer that she is anemic but was not treated. She has been watched for a benign lesion of her hip which did not require biopsy.  Physical examination:Kim Julian Reil present. HEENT within normal limits. Neck: Thyroid not large. No masses. Supraclavicular nodes: not enlarged. Breasts: Examined in both sitting and lying  position. No skin changes and no masses. Abdomen: Soft no guarding rebound or masses or hernia. Pelvic: External: Within normal limits. BUS: Within normal limits. Vaginal:within normal limits. Good estrogen effect. No evidence of cystocele rectocele or enterocele. Cervix: clean. Uterus: Normal size and shape. Adnexa: No masses. Rectovaginal exam: Confirmatory and negative. Extremities: Within normal limits.  Assessment: #1. Menorrhagia #2. Family history of early onset breast cancer.  Plan: CBC. Discussed Mirena IUD in spite of weight gain previously. She does not do well with birth control pills. Discussed endometrial ablation but only if she's 100% sure that she does not want more children.  Literature given on both procedures. Patient to discuss with her aunt BRCA1 and BRCA2 testing and will let me know. Plan mammograms yearly at 33.The new Pap smear guidelines were discussed with the patient. Pap not done.

## 2012-05-23 NOTE — Patient Instructions (Signed)
Find out if aunt  was tested for BRCA1 and 2. If not she needs to be tested. Please let me know outcome.

## 2012-05-24 ENCOUNTER — Encounter: Payer: Self-pay | Admitting: Obstetrics and Gynecology

## 2012-05-24 LAB — URINALYSIS W MICROSCOPIC + REFLEX CULTURE
Bacteria, UA: NONE SEEN
Bilirubin Urine: NEGATIVE
Ketones, ur: NEGATIVE mg/dL
Nitrite: NEGATIVE
Protein, ur: NEGATIVE mg/dL
Urobilinogen, UA: 1 mg/dL (ref 0.0–1.0)

## 2012-05-29 ENCOUNTER — Telehealth: Payer: Self-pay | Admitting: Obstetrics and Gynecology

## 2012-05-29 NOTE — Telephone Encounter (Signed)
I LM on pt VM  that the Mirena is covered with a $30.00 copay. She will call if she wants [place] to insert one and was informed that she needs to have this inserted while on her cycle.[facility] (BC COVERS BY MED NEC DX)

## 2012-07-31 ENCOUNTER — Other Ambulatory Visit: Payer: Self-pay | Admitting: Gynecology

## 2012-07-31 ENCOUNTER — Encounter: Payer: Self-pay | Admitting: Gynecology

## 2012-07-31 ENCOUNTER — Ambulatory Visit (INDEPENDENT_AMBULATORY_CARE_PROVIDER_SITE_OTHER): Payer: BC Managed Care – PPO | Admitting: Gynecology

## 2012-07-31 VITALS — BP 128/70

## 2012-07-31 DIAGNOSIS — Z30430 Encounter for insertion of intrauterine contraceptive device: Secondary | ICD-10-CM

## 2012-07-31 DIAGNOSIS — Z3049 Encounter for surveillance of other contraceptives: Secondary | ICD-10-CM

## 2012-07-31 MED ORDER — LEVONORGESTREL 20 MCG/24HR IU IUD: INTRAUTERINE_SYSTEM | Freq: Once | INTRAUTERINE | Status: DC

## 2012-07-31 NOTE — Progress Notes (Signed)
Patient is a 31 year old gravida 2 para 2 (normal spontaneous vaginal delivery) who presented to the office today for placement of Mirena IUD. Patient been using condoms for contraception. Patient had a complete gynecological examination on 05/23/2012 see detail note. Patient suffers from dysmenorrhea and menorrhagia also along with a benefits for contraception she will benefit as well from the progestational component of the Mirena IUD.  Patient was counseled as to risk benefits and pros and cons of the Mirena IUD. Patient fully aware that this form of contraception is 99% effective and is good for 5 years.  Exam: Bartholin urethra Skene glands: Within normal limits Vagina: Menstrual blood was present Cervix: Mr. blood present no lesion seen Uterus: Anteverted upper limits of normal no palpable masses or tenderness Rectal exam: Not done  Procedure: The cervix was cleansed Betadine solution. The anterior cervical lip was grasped with a single-tooth tenaculum. The uterus sounded to 7-1/2 cm. The  Mirena IUD were shown to the patient and inserted sterilely and the string was trimmed. The single-tooth tenaculum was removed.  Patient will return back to the office in one month for followup after having placed a Mirena IUD.

## 2012-07-31 NOTE — Patient Instructions (Signed)
Intrauterine Device Information  An intrauterine device (IUD) is inserted into your uterus and prevents pregnancy. There are 2 types of IUDs available:  · Copper IUD. This type of IUD is wrapped in copper wire and is placed inside the uterus. Copper makes the uterus and fallopian tubes produce a fluid that kills sperm. The copper IUD can stay in place for 10 years.  · Hormone IUD. This type of IUD contains the hormone progestin (synthetic progesterone). The hormone thickens the cervical mucus and prevents sperm from entering the uterus, and it also thins the uterine lining to prevent implantation of a fertilized egg. The hormone can weaken or kill the sperm that get into the uterus. The hormone IUD can stay in place for 5 years.  Your caregiver will make sure you are a good candidate for a contraceptive IUD. Discuss with your caregiver the possible side effects.  ADVANTAGES  · It is highly effective, reversible, long-acting, and low maintenance.  · There are no estrogen-related side effects.  · An IUD can be used when breastfeeding.  · It is not associated with weight gain.  · It works immediately after insertion.  · The copper IUD does not interfere with your female hormones.  · The progesterone IUD can make heavy menstrual periods lighter.  · The progesterone IUD can be used for 5 years.  · The copper IUD can be used for 10 years.  DISADVANTAGES  · The progesterone IUD can be associated with irregular bleeding patterns.  · The copper IUD can make your menstrual flow heavier and more painful.  · You may experience cramping and vaginal bleeding after insertion.  Document Released: 07/05/2004 Document Revised: 10/24/2011 Document Reviewed: 12/04/2010  ExitCare® Patient Information ©2013 [employer].

## 2012-08-01 ENCOUNTER — Encounter: Payer: Self-pay | Admitting: Gynecology

## 2012-08-16 ENCOUNTER — Encounter: Payer: Self-pay | Admitting: Family Medicine

## 2012-08-16 ENCOUNTER — Ambulatory Visit (INDEPENDENT_AMBULATORY_CARE_PROVIDER_SITE_OTHER): Payer: BC Managed Care – PPO | Admitting: Family Medicine

## 2012-08-16 VITALS — BP 120/70 | HR 84 | Temp 98.3°F | Wt 269.0 lb

## 2012-08-16 DIAGNOSIS — J069 Acute upper respiratory infection, unspecified: Secondary | ICD-10-CM | POA: Insufficient documentation

## 2012-08-16 MED ORDER — HYDROCODONE-HOMATROPINE 5-1.5 MG/5ML PO SYRP: 5.0000 mL | ORAL_SOLUTION | Freq: Every evening | ORAL | Status: DC | PRN

## 2012-08-16 NOTE — Patient Instructions (Signed)
Sounds like you have a viral upper respiratory infection. Antibiotics are not needed for this.  Viral infections usually take 7-10 days to resolve.  The cough can last several weeks to go away. Use medication as prescribed: hycodan for cough. Use ibuprofen for inflammation in throat and bronchi. Push fluids and plenty of rest. Please return if you are not improving as expected, or if you have high fevers (>101.5) or difficulty swallowing or worsening productive cough. Call clinic with questions.  Good to see you today.

## 2012-08-16 NOTE — Assessment & Plan Note (Signed)
Discussed supportive care as per instructions. Does not think Tdap updated. If persistent, update [place].  Pt agrees with plan.

## 2012-08-16 NOTE — Progress Notes (Signed)
  Subjective:    Patient ID: Brittney [name], female    DOB: 1981/07/21, 32 y.o.   MRN: [id number]  HPI CC: cold sxs  1d h/o sore throat, coughing - dry, chest congestion.  Some coughing fits.  Using humidifer at night.  No fevers/ chills, abd pain, rashes, myalgia, ear or tooth pain, PNdrainage.  No HA.  No smokers at home. + daughter sick recently and husband sick recently. No h/o asthma.  Past Medical History  Diagnosis Date  . Irritable bowel syndrome   . Obesity, unspecified   . Fibroids   . Migraine   . Anemia      Review of Systems Per HPI    Objective:   Physical Exam  Nursing note and vitals reviewed. Constitutional: She appears well-developed and well-nourished. No distress.  HENT:  Head: Normocephalic and atraumatic.  Right Ear: Hearing, tympanic membrane, external ear and ear canal normal.  Left Ear: Hearing, tympanic membrane, external ear and ear canal normal.  Nose: No mucosal edema or rhinorrhea. Right sinus exhibits no maxillary sinus tenderness and no frontal sinus tenderness. Left sinus exhibits no maxillary sinus tenderness and no frontal sinus tenderness.  Mouth/Throat: Uvula is midline and mucous membranes are normal. Posterior oropharyngeal erythema (mild) present. No oropharyngeal exudate, posterior oropharyngeal edema or tonsillar abscesses.  Eyes: Conjunctivae normal and EOM are normal. Pupils are equal, round, and reactive to light. No scleral icterus.  Neck: Normal range of motion. Neck supple.  Cardiovascular: Normal rate, regular rhythm, normal heart sounds and intact distal pulses.   No murmur heard. Pulmonary/Chest: Effort normal and breath sounds normal. No respiratory distress. She has no wheezes. She has no rales.  Lymphadenopathy:    She has no cervical adenopathy.  Skin: Skin is warm and dry. No rash noted.       Assessment & Plan:

## 2012-08-20 ENCOUNTER — Encounter: Payer: Self-pay | Admitting: Family Medicine

## 2012-08-20 ENCOUNTER — Ambulatory Visit (INDEPENDENT_AMBULATORY_CARE_PROVIDER_SITE_OTHER): Payer: BC Managed Care – PPO | Admitting: Family Medicine

## 2012-08-20 VITALS — BP 108/74 | HR 66 | Temp 98.3°F | Ht 68.0 in | Wt 272.2 lb

## 2012-08-20 DIAGNOSIS — E669 Obesity, unspecified: Secondary | ICD-10-CM

## 2012-08-20 MED ORDER — ORLISTAT 120 MG PO CAPS: 120.0000 mg | ORAL_CAPSULE | Freq: Three times a day (TID) | ORAL | Status: DC

## 2012-08-20 NOTE — Progress Notes (Signed)
Subjective:    Patient ID: Brittney [name], female    DOB: 1980/12/23, 32 y.o.   MRN: [id number]  HPI She is here to discuss obesity  She wants to use her flexible spending account to pay for gym   Also wt watchers  She did it on line for a while  She grew tired of it and stopped -  She did see a dietician years ago - it was very helpful   She has lost weight with different efforts in the past   Not ready for weight loss surgery at this time   Patient Active Problem List  Diagnosis  . OBESITY  . PANIC ATTACK  . MIGRAINE HEADACHE  . HEARING LOSS, MILD  . IBS  . RECTAL BLEEDING  . RECTAL PAIN  . HEMATURIA UNSPECIFIED  . OSTEOARTHRITIS  . EDEMA  . WEIGHT GAIN  . Vaginal discharge  . UTI in pregnancy  . Postpartum state  . Headache  . Motion sickness  . Abdominal pain  . Blood in the stool  . Sclerosis, ilium, piriform  . URI (upper respiratory infection)  . Viral URI with cough   Past Medical History  Diagnosis Date  . Irritable bowel syndrome   . Obesity, unspecified   . Fibroids   . Migraine   . Anemia    Past Surgical History  Procedure Date  . Colonoscopy 11/2001    polyp; negative pathology  . Colonoscopy 09/2004    Negative  . Uterine US 09/2005    Large endometrial stripe; small ovarian cyst  . Laparoscopic decortication / dubulking / ablation renal cysts     saline histogram  . Tonsillectomy 07/30/07  . Esophagogastroduodenoscopy 1/10    normal  . Colonoscopy w/ polypectomy   . Cholecystectomy 05/06/11  . Flexible sigmoidoscopy 03/05/2012    Procedure: FLEXIBLE SIGMOIDOSCOPY;  Surgeon: Louis Meckel, MD;  Location: [facility];  Service: Endoscopy;  Laterality: N/A;   History  Substance Use Topics  . Smoking status: Never Smoker   . Smokeless tobacco: Never Used  . Alcohol Use: No     Comment: rare   Family History  Problem Relation Age of Onset  . Ulcers Brother   . Clotting disorder Brother   . Colon cancer Maternal Grandmother     . Ovarian cancer Maternal Grandmother   . Diabetes Paternal Grandmother   . Hypertension Paternal Grandmother   . Diabetes Paternal Aunt   . Heart disease Father   . Hypertension Father   . Breast cancer Maternal Aunt     Age 105's  . Diabetes Paternal Aunt   . Diabetes Paternal Aunt   . Diabetes Paternal Aunt    Allergies  Allergen Reactions  . Lansoprazole     REACTION: rash   Current Outpatient Prescriptions on File Prior to Visit  Medication Sig Dispense Refill  . HYDROcodone-homatropine (HYCODAN) 5-1.5 MG/5ML syrup Take 5 mLs by mouth at bedtime as needed for cough.  140 mL  0  . hyoscyamine (LEVBID) 0.375 MG 12 hr tablet Take 1 tablet (0.375 mg total) by mouth every 12 (twelve) hours as needed for cramping.  60 tablet  3  . levonorgestrel (MIRENA) 20 MCG/24HR IUD 1 each by Intrauterine route once.      . ranitidine (ZANTAC) 150 MG capsule Take 150 mg by mouth as needed.       . SUMAtriptan (IMITREX) 100 MG tablet Take 1 tablet (100 mg total) by mouth every 2 (two) hours as needed  for migraine.  10 tablet  3  . orlistat (XENICAL) 120 MG capsule Take 1 capsule (120 mg total) by mouth 3 (three) times daily with meals.  90 capsule  5      Review of Systems Review of Systems  Constitutional: Negative for fever, appetite change, fatigue and unexpected weight change.  Eyes: Negative for pain and visual disturbance.  Respiratory: Negative for cough and shortness of breath.   Cardiovascular: Negative for cp or palpitations    Gastrointestinal: Negative for nausea, diarrhea and constipation.  Genitourinary: Negative for urgency and frequency.  Skin: Negative for pallor or rash   Neurological: Negative for weakness, light-headedness, numbness and headaches.  Hematological: Negative for adenopathy. Does not bruise/bleed easily.  Psychiatric/Behavioral: Negative for dysphoric mood. The patient is not nervous/anxious.         Objective:   Physical Exam  Constitutional: She  appears well-developed and well-nourished. No distress.       obese and well appearing   HENT:  Head: Normocephalic and atraumatic.  Mouth/Throat: Oropharynx is clear and moist.  Eyes: Conjunctivae normal and EOM are normal. Pupils are equal, round, and reactive to light. Right eye exhibits no discharge. Left eye exhibits no discharge. No scleral icterus.  Neck: Normal range of motion. Neck supple. No JVD present. Carotid bruit is not present. No thyromegaly present.  Cardiovascular: Normal rate, regular rhythm, normal heart sounds and intact distal pulses.  Exam reveals no gallop.   Pulmonary/Chest: Effort normal and breath sounds normal. No respiratory distress. She has no wheezes.  Abdominal: Soft. Bowel sounds are normal. She exhibits no distension, no abdominal bruit and no mass. There is no tenderness.  Musculoskeletal: She exhibits no edema and no tenderness.  Lymphadenopathy:    She has no cervical adenopathy.  Neurological: She is alert. She has normal reflexes.  Skin: Skin is warm and dry. No rash noted. No pallor.  Psychiatric: She has a normal mood and affect.          Assessment & Plan:

## 2012-08-20 NOTE — Patient Instructions (Addendum)
For weight loss either consider Weight watchers meetings or My Fitness pal app  Try the xenical as directed  Start exercise 5 days per week for 30 minutes --work up to it  See Shirlee Limerick up front for dietician referral

## 2012-08-20 NOTE — Assessment & Plan Note (Addendum)
Discussed how this problem influences overall health and the risks it imposes  Reviewed plan for weight loss with lower calorie diet (via better food choices and also portion control or program like weight watchers) and exercise building up to or more than 30 minutes 5 days per week including some aerobic activity   Letter written expressing need for gym membership and also need for dietician consult Plan made to join wt watchers also and /or consider MY fitness pal app on phone   Very motivated and expect success  >25 min spent with face to face with patient, >50% counseling and/or coordinating care

## 2012-08-29 ENCOUNTER — Ambulatory Visit: Payer: Self-pay | Admitting: Family Medicine

## 2012-09-04 ENCOUNTER — Ambulatory Visit: Payer: BC Managed Care – PPO | Admitting: Gynecology

## 2012-09-11 ENCOUNTER — Ambulatory Visit: Payer: BC Managed Care – PPO | Admitting: Gynecology

## 2012-09-15 ENCOUNTER — Ambulatory Visit: Payer: Self-pay | Admitting: Family Medicine

## 2012-09-17 ENCOUNTER — Encounter: Payer: Self-pay | Admitting: Family Medicine

## 2012-09-17 ENCOUNTER — Ambulatory Visit (INDEPENDENT_AMBULATORY_CARE_PROVIDER_SITE_OTHER): Payer: BC Managed Care – PPO | Admitting: Family Medicine

## 2012-09-17 ENCOUNTER — Ambulatory Visit (INDEPENDENT_AMBULATORY_CARE_PROVIDER_SITE_OTHER): Payer: BC Managed Care – PPO | Admitting: Gynecology

## 2012-09-17 ENCOUNTER — Encounter: Payer: Self-pay | Admitting: Gynecology

## 2012-09-17 VITALS — BP 120/80

## 2012-09-17 VITALS — BP 106/72 | HR 73 | Temp 97.9°F | Ht 68.0 in | Wt 272.2 lb

## 2012-09-17 DIAGNOSIS — S00412A Abrasion of left ear, initial encounter: Secondary | ICD-10-CM

## 2012-09-17 DIAGNOSIS — Z23 Encounter for immunization: Secondary | ICD-10-CM

## 2012-09-17 DIAGNOSIS — Z30431 Encounter for routine checking of intrauterine contraceptive device: Secondary | ICD-10-CM

## 2012-09-17 DIAGNOSIS — J069 Acute upper respiratory infection, unspecified: Secondary | ICD-10-CM

## 2012-09-17 DIAGNOSIS — IMO0002 Reserved for concepts with insufficient information to code with codable children: Secondary | ICD-10-CM

## 2012-09-17 MED ORDER — MUPIROCIN 2 % EX OINT: TOPICAL_OINTMENT | Freq: Two times a day (BID) | CUTANEOUS | Status: DC

## 2012-09-17 NOTE — Progress Notes (Signed)
Subjective:    Patient ID: Brittney [name], female    DOB: Jul 27, 1981, 32 y.o.   MRN: [id number]  HPI Here for a sore in her ear that is not hearing  Has used alcohol/ peroxide It is beginning to hurt   Also uri symptom  ST -post nasal drip and runny/ stuffy nose  No fever  No cough   Patient Active Problem List  Diagnosis  . OBESITY  . MIGRAINE HEADACHE  . HEARING LOSS, MILD  . IBS  . OSTEOARTHRITIS  . EDEMA  . WEIGHT GAIN  . Sclerosis, ilium, piriform   Past Medical History  Diagnosis Date  . Irritable bowel syndrome   . Obesity, unspecified   . Fibroids   . Migraine   . Anemia    Past Surgical History  Procedure Date  . Colonoscopy 11/2001    polyp; negative pathology  . Colonoscopy 09/2004    Negative  . Uterine US 09/2005    Large endometrial stripe; small ovarian cyst  . Laparoscopic decortication / dubulking / ablation renal cysts     saline histogram  . Tonsillectomy 07/30/07  . Esophagogastroduodenoscopy 1/10    normal  . Colonoscopy w/ polypectomy   . Cholecystectomy 05/06/11  . Flexible sigmoidoscopy 03/05/2012    Procedure: FLEXIBLE SIGMOIDOSCOPY;  Surgeon: Louis Meckel, MD;  Location: [facility];  Service: Endoscopy;  Laterality: N/A;   History  Substance Use Topics  . Smoking status: Never Smoker   . Smokeless tobacco: Never Used  . Alcohol Use: No     Comment: rare   Family History  Problem Relation Age of Onset  . Ulcers Brother   . Clotting disorder Brother   . Colon cancer Maternal Grandmother   . Ovarian cancer Maternal Grandmother   . Diabetes Paternal Grandmother   . Hypertension Paternal Grandmother   . Diabetes Paternal Aunt   . Heart disease Father   . Hypertension Father   . Breast cancer Maternal Aunt     Age 37's  . Diabetes Paternal Aunt   . Diabetes Paternal Aunt   . Diabetes Paternal Aunt    Allergies  Allergen Reactions  . Lansoprazole     REACTION: rash   Current Outpatient Prescriptions on File Prior  to Visit  Medication Sig Dispense Refill  . hyoscyamine (LEVBID) 0.375 MG 12 hr tablet Take 1 tablet (0.375 mg total) by mouth every 12 (twelve) hours as needed for cramping.  60 tablet  3  . levonorgestrel (MIRENA) 20 MCG/24HR IUD 1 each by Intrauterine route once.      . ranitidine (ZANTAC) 150 MG capsule Take 150 mg by mouth as needed.       . SUMAtriptan (IMITREX) 100 MG tablet Take 1 tablet (100 mg total) by mouth every 2 (two) hours as needed for migraine.  10 tablet  3  . orlistat (XENICAL) 120 MG capsule Take 1 capsule (120 mg total) by mouth 3 (three) times daily with meals.  90 capsule  5      Review of Systems Review of Systems  Constitutional: Negative for fever, appetite change,  and unexpected weight change.  ENt pos for ST and drainage/ neg for sinus pain neg for ear drainage Eyes: Negative for pain and visual disturbance.  Respiratory: Negative for cough and shortness of breath.   Cardiovascular: Negative for cp or palpitations    Gastrointestinal: Negative for nausea, diarrhea and constipation.  Genitourinary: Negative for urgency and frequency.  Skin: Negative for pallor or rash  pos for sore spots on ear Neurological: Negative for weakness, light-headedness, numbness and headaches.  Hematological: Negative for adenopathy. Does not bruise/bleed easily.  Psychiatric/Behavioral: Negative for dysphoric mood. The patient is not nervous/anxious.         Objective:   Physical Exam  Constitutional: She appears well-developed and well-nourished. No distress.       obese and well appearing   HENT:  Head: Normocephalic and atraumatic.  Mouth/Throat: Oropharynx is clear and moist. No oropharyngeal exudate.       Nares boggy with clear rhinorrhea Clear post nasal drip -no throat erythema  TMs clear See skin exam for pinna findings   Eyes: Conjunctivae normal and EOM are normal. Pupils are equal, round, and reactive to light. Right eye exhibits no discharge. Left eye  exhibits no discharge.  Neck: Normal range of motion. Neck supple.  Cardiovascular: Normal rate and regular rhythm.   Pulmonary/Chest: Effort normal and breath sounds normal. No respiratory distress. She has no wheezes. She has no rales.  Lymphadenopathy:    She has no cervical adenopathy.  Neurological: She is alert.  Skin: Skin is warm and dry. No rash noted. No pallor.       L pinna 2 small (2-3 mm) erythematous papules on inner pinna -- they are irritated but not draining  Do resemble angioma nevi somewhat  Also some flaky skin on pinna bilat Canals are clear   Psychiatric: She has a normal mood and affect.          Assessment & Plan:

## 2012-09-17 NOTE — Patient Instructions (Addendum)
Inactivated Influenza Vaccine What You Need to Know WHY GET VACCINATED?  Influenza ("flu") is a contagious disease.  It is caused by the influenza virus, which can be spread by coughing, sneezing, or nasal secretions.  Anyone can get influenza, but rates of infection are highest among children. For most people, symptoms last only a few days. They include:  Fever or chills.  Sore throat.  Muscle aches.  Fatigue.  Cough.  Headache.  Runny or stuffy nose. Other illnesses can have the same symptoms and are often mistaken for influenza. Young children, people 65 and older, pregnant women, and people with certain health conditions, such as heart, lung or kidney disease, or a weakened immune system can get much sicker. Flu can cause high fever and pneumonia, and make existing medical conditions worse. It can cause diarrhea and seizures in children. Each year thousands of people die from influenza and even more require hospitalization. By getting flu vaccine, you can protect yourself from influenza and may also avoid spreading influenza to others. INACTIVATED INFLUENZA VACCINE There are 2 types of influenza vaccine:  Inactivated (killed) vaccine, the "flu shot", is given by injection with a needle.  Live, attenuated (weakened) influenza vaccine is sprayed into the nostrils. This vaccine is described in a separate Vaccine Information Statement. A "high-dose" inactivated influenza vaccine is available for people 65 years of age and older. Ask your doctor for more information.  Influenza viruses are always changing, so annual vaccination is recommended. Each year scientists try to match the viruses in the vaccine to those most likely to cause flu that year. Flu vaccine will not prevent disease from other viruses, including flu viruses not contained in the vaccine. It takes up to 2 weeks for protection to develop after the shot. Protection lasts about 1 year. Some inactivated influenza vaccine  contains a preservative called thimerosal. Thimerosal-free influenza vaccine is available. Ask your doctor for more information. WHO SHOULD GET INACTIVATED INFLUENZA VACCINE AND WHEN? WHO  All people 6 months of age and older should get flu vaccine.  Vaccination is especially important for people at higher risk of severe influenza and their close contacts, including healthcare personnel and close contacts of children younger than 6 months. WHEN Getting the vaccine as soon as it is available. This should provide protection if the flu season comes early. You can get the vaccine as long as illness is occurring in your community. Influenza can occur at any time, but most influenza occurs from October through May. In recent seasons, most infections have occurred in January and February. Getting vaccinated in December, or even later, will still be beneficial in most years. Adults and older children need 1 dose of influenza vaccine each year. But some children younger than 9 years of age need 2 doses to be protected. Ask your doctor. Influenza vaccine may be given at the same time as other vaccines, including pneumococcal vaccine. SOME PEOPLE SHOULD NOT GET INACTIVATED INFLUENZA VACCINE OR SHOULD WAIT  Tell your doctor if you have any severe (life-threatening) allergies, including a severe allergy to eggs. A severe allergy to any vaccine component may be a reason not to get the vaccine. Allergic reactions to influenza vaccine are rare.  Tell your doctor if you ever had a severe reaction after a dose of influenza vaccine.  Tell your doctor if you ever had Guillain-Barr syndrome (a severe paralytic illness, also called GBS). Your doctor will help you decide whether the vaccine is recommended for you.  People who are   moderately or severely ill should usually wait until they recover before getting the flu vaccine. If you are ill, talk to your doctor about whether to reschedule the vaccination. People with  a mild illness can usually get the vaccine. WHAT ARE THE RISKS FROM INACTIVATED INFLUENZA VACCINE? A vaccine, like any medicine, could possibly cause serious problems, such as severe allergic reactions. The risk of a vaccine causing serious harm, or death, is extremely small. Serious problems from inactivated influenza vaccine are very rare. The viruses in inactivated influenza vaccine have been killed, so you cannot get influenza from the vaccine. Mild problems:  Soreness, redness, or swelling where the shot was given.  Hoarseness; sore, red or itchy eyes; cough.  Fever.  Aches.  Headache.  Itching.  Fatigue. If these problems occur, they usually begin soon after the shot and last 1 to 2 days. Moderate problems: Young children who get inactivated flu vaccine and pneumococcal vaccine (PCV13) at the same time appear to be at increased risk for seizures caused by fever. Ask your doctor for more information. Tell your doctor if a child who is getting flu vaccine has ever had a seizure. Severe problems:  Life-threatening allergic reactions from vaccines are very rare. If they do occur, it is usually within a few minutes to a few hours after the shot.  In 1976, a type of inactivated influenza (swine flu) vaccine was associated with Guillan-Barr syndrome (GBS). Since then, flu vaccines have not been clearly linked to GBS. However, if there is a risk of GBS from current flu vaccines, it would be no more than 1 or 2 cases per million people vaccinated. This is much lower than the risk of severe influenza, which can be prevented by vaccination. The safety of vaccines is always being monitored. For more information, visit:  www.cdc.gov/vaccinesafety/Vaccine_Monitoring/Index.html and  www.cdc.gov/vaccinesafety/Activities/Activities_Index.html One brand of inactivated flu vaccine, called Afluria, should not be given to children 8 years of age or younger, except in special circumstances. A  related vaccine was associated with fevers and fever-related seizures in young children in [place]. Your doctor can give you more information. WHAT IF THERE IS A SEVERE REACTION? What should I look for? Any unusual condition, such as a high fever or unusual behavior. Signs of a serious allergic reaction can include difficulty breathing, hoarseness or wheezing, hives, paleness, weakness, a fast heartbeat, or dizziness. What should I do?  Call a doctor, or get the person to a doctor right away.  Tell your doctor what happened, the date and time it happened, and when the vaccination was given.  Ask your doctor, nurse, or health department to report the reaction by filing a Vaccine Adverse Event Reporting System (VAERS) form. Or, you can file this report through the VAERS website at www.vaers.hhs.gov or by calling 1-800-822-7967. VAERS does not provide medical advice. THE NATIONAL VACCINE INJURY COMPENSATION PROGRAM The National Vaccine Injury Compensation Program (VICP) was created in 1986. People who believe they may have been injured by a vaccine can learn about the program and about filing a claim by calling 1-800-338-2382, or visiting the VICP website at www.hrsa.gov/vaccinecompensation HOW CAN I LEARN MORE?  Ask your doctor. They can give you the vaccine package insert or suggest other sources of information.  Call your local or state health department.  Contact the Centers for Disease Control and Prevention (CDC):  Call 1-800-232-4636 (1-800-CDC-INFO) or  Visit the CDC's website at www.cdc.gov/flu CDC Inactivated Influenza Vaccine VIS (02/14/11) Document Released: 05/26/2006 Document Revised: 01/31/2012 Document Reviewed:   02/14/2011 ExitCare Patient Information 2013 [employer].  

## 2012-09-17 NOTE — Progress Notes (Signed)
Patient presented to the office today for her one month followup after having placed a Mirena IUD on 07/31/2012. Patient is doing well with no complaints. See previous note for additional details.  Exam: Bartholin urethra Skene glands: Within normal limits Vagina: No lesions or discharge all her menstrual cycle is present. Cervix: IUD string seen no gross lesions Uterus: Anteverted normal size shape and consistency Adnexa: No palpable masses or tenderness Rectal exam: Not done  Patient requested to have the flu vaccine today and he was administered.  Assessment/plan: One month status post placement of Mirena IUD doing well. Patient will return back to the office at the end of the year for her annual exam or when necessary.

## 2012-09-17 NOTE — Assessment & Plan Note (Signed)
With post nasal drip/ rhinorrhea and ST Disc symptomatic care - see instructions on AVS  Update if not starting to improve in a week or if worsening

## 2012-09-17 NOTE — Patient Instructions (Addendum)
Keep ear area clean with antibacterial soap and water  Use bactroban ointment twice daily  If no improvement or if worse let me know  I think you have an early cold - wash hands often  Fluids/ rest / otc cold remedies may help

## 2012-09-17 NOTE — Addendum Note (Signed)
Addended by: Bertram Savin A on: 09/17/2012 04:34 PM   Modules accepted: Orders

## 2012-09-17 NOTE — Assessment & Plan Note (Signed)
Several small papules on pinna- erythematous 2-3 mm (? If could have begun with angioma type nevi but unsure) inflammed- not overly infected looking  Also some dry flaking skin  Disc cleansing with antibacterial soap and water  Px bactroban ointment to use bid until imp Update if not starting to improve in a week or if worsening

## 2012-09-24 ENCOUNTER — Telehealth: Payer: Self-pay | Admitting: *Deleted

## 2012-09-24 ENCOUNTER — Encounter: Payer: Self-pay | Admitting: Gynecology

## 2012-09-24 ENCOUNTER — Ambulatory Visit (INDEPENDENT_AMBULATORY_CARE_PROVIDER_SITE_OTHER): Payer: BC Managed Care – PPO | Admitting: Gynecology

## 2012-09-24 VITALS — BP 130/88

## 2012-09-24 DIAGNOSIS — N925 Other specified irregular menstruation: Secondary | ICD-10-CM

## 2012-09-24 DIAGNOSIS — N719 Inflammatory disease of uterus, unspecified: Secondary | ICD-10-CM

## 2012-09-24 DIAGNOSIS — N938 Other specified abnormal uterine and vaginal bleeding: Secondary | ICD-10-CM | POA: Insufficient documentation

## 2012-09-24 DIAGNOSIS — N949 Unspecified condition associated with female genital organs and menstrual cycle: Secondary | ICD-10-CM

## 2012-09-24 LAB — PREGNANCY, URINE: Preg Test, Ur: NEGATIVE

## 2012-09-24 MED ORDER — ESTRADIOL 1 MG PO TABS: 1.0000 mg | ORAL_TABLET | Freq: Every day | ORAL | Status: DC

## 2012-09-24 MED ORDER — FLUCONAZOLE 100 MG PO TABS: ORAL_TABLET | ORAL | Status: DC

## 2012-09-24 MED ORDER — DOXYCYCLINE HYCLATE 50 MG PO CAPS: 100.0000 mg | ORAL_CAPSULE | Freq: Two times a day (BID) | ORAL | Status: DC

## 2012-09-24 NOTE — Telephone Encounter (Signed)
Pt is calling c/o heavy vaginal bleeding since Sep 15 2012. Pt is changing pads every 2 hours with cramping, she asked if Rx could be given to help with bleeding? LMP was in Dec. Please advise

## 2012-09-24 NOTE — Telephone Encounter (Signed)
We need to see this patient today.  Please check with ultrasound to see if we can do an ultrasound todayto make sure that the IUD has not shifted position since she is having so much heavy bleeding and I will see her.

## 2012-09-24 NOTE — Progress Notes (Signed)
Patient presented to the office today stating that she has been bleeding since her office visits on February 3. She was in the office on Monday for her one month followup after having had a Mirena IUD placed on 07/31/2012. She was having no complaints at that visit.  Exam: Abdomen: Soft nontender no rebound or guarding Pelvic: Bartholin urethra Skene was within normal limits Vagina: Some menstrual blood was present Cervix: IUD string was seen no cervical lesions visualized Uterus: Anteverted normal size shape and consistency nontender Adnexa: No palpable masses or tenderness rectal exam not done  Assessment/plan: Dysfunction uterine bleeding with IUD. Possibilities include mild endometritis. She will be placed on Vibramycin 100 mg one by mouth twice a day for one week. She will also be placed on estradiol 1 mg one by mouth for the next 30 days to build up her endometrium help stop her bleeding. If she does not stop bleeding continues she'll return back to the office and we'll followup with an ultrasound and further testing is indicated. She was given a prescription Diflucan since she states that she develops a yeast infections which she takes antibiotics.urine pregnancy test was obtained results pending at time of this dictation. Patient is not breast-feeding.

## 2012-09-24 NOTE — Telephone Encounter (Signed)
Appt. Today at 3:10 pm

## 2012-09-25 ENCOUNTER — Telehealth: Payer: Self-pay | Admitting: *Deleted

## 2012-09-25 NOTE — Telephone Encounter (Signed)
Pt called requesting pregnancy test result from OV 09/24/12. Left on voicemail negative results.

## 2012-11-12 ENCOUNTER — Telehealth: Payer: Self-pay | Admitting: Gastroenterology

## 2012-11-12 NOTE — Telephone Encounter (Signed)
Left message for pt to call back.  Pt states she has been having diarrhea. States that every time she eats her stomach hurts and she has diarrhea. Pt scheduled to see Dr. Arlyce Dice 11/16/12@9 :30am. Pt aware of appt date and time.

## 2012-11-16 ENCOUNTER — Encounter: Payer: Self-pay | Admitting: Gastroenterology

## 2012-11-16 ENCOUNTER — Ambulatory Visit (INDEPENDENT_AMBULATORY_CARE_PROVIDER_SITE_OTHER): Payer: BC Managed Care – PPO | Admitting: Gastroenterology

## 2012-11-16 VITALS — BP 110/60 | HR 70 | Ht 68.0 in | Wt 278.0 lb

## 2012-11-16 DIAGNOSIS — R197 Diarrhea, unspecified: Secondary | ICD-10-CM | POA: Insufficient documentation

## 2012-11-16 MED ORDER — HYOSCYAMINE SULFATE ER 0.375 MG PO TBCR: EXTENDED_RELEASE_TABLET | ORAL | Status: DC

## 2012-11-16 NOTE — Progress Notes (Signed)
History of Present Illness:  Patient has returned for evaluation of diarrhea. Over the past week she has had multiple loose stools up to 4 times a day accompanied by crampy lower abdominal pain. Stools are becoming more solid but are still poorly formed. She denies nausea vomiting or fever. She has not had  rectal bleeding.    Review of Systems: Pertinent positive and negative review of systems were noted in the above HPI section. All other review of systems were otherwise negative.    Current Medications, Allergies, Past Medical History, Past Surgical History, Family History and Social History were reviewed in [employer] electronic medical record  Vital signs were reviewed in today's medical record. Physical Exam: General: Well developed , well nourished, no acute distress Skin: anicteric Head: Normocephalic and atraumatic Eyes:  sclerae anicteric, EOMI Ears: Normal auditory acuity Mouth: No deformity or lesions Lungs: Clear throughout to auscultation Heart: Regular rate and rhythm; no murmurs, rubs or bruits Abdomen: Soft, non tender and non distended. No masses, hepatosplenomegaly or hernias noted. Normal Bowel sounds Rectal:deferred Musculoskeletal: Symmetrical with no gross deformities  Pulses:  Normal pulses noted Extremities: No clubbing, cyanosis, edema or deformities noted Neurological: Alert oriented x 4, grossly nonfocal Psychological:  Alert and cooperative. Normal mood and affect

## 2012-11-16 NOTE — Assessment & Plan Note (Addendum)
Diarrhea may be due to an intercurrent infection. It could also be a variant of  IBS.  Recommendations #1 daily probiotic for 2 weeks #2 hyomax as needed

## 2012-11-16 NOTE — Patient Instructions (Addendum)
Please start probiotic daily for 2 weeks.  You can purchase this over the counter. Call Dr Arlyce Dice as needed.  CC:  [name]

## 2013-01-02 ENCOUNTER — Telehealth: Payer: Self-pay | Admitting: Gastroenterology

## 2013-01-02 NOTE — Telephone Encounter (Signed)
Left message for pt to call back.  Pt states she was seen back in April and told to call back if no improvement with probiotic. Pt states she is still having problems with abdominal cramping and loose stools. Pt requesting to be seen. Pt scheduled to see Dr. Arlyce Dice 01/03/13@8 :45am. Pt aware of appt date and time.

## 2013-01-03 ENCOUNTER — Ambulatory Visit (INDEPENDENT_AMBULATORY_CARE_PROVIDER_SITE_OTHER): Payer: BC Managed Care – PPO | Admitting: Gastroenterology

## 2013-01-03 ENCOUNTER — Encounter: Payer: Self-pay | Admitting: Gastroenterology

## 2013-01-03 VITALS — BP 116/72 | HR 68 | Ht 68.0 in | Wt 277.0 lb

## 2013-01-03 DIAGNOSIS — R197 Diarrhea, unspecified: Secondary | ICD-10-CM

## 2013-01-03 MED ORDER — MESALAMINE 1.2 G PO TBEC: 1200.0000 mg | DELAYED_RELEASE_TABLET | Freq: Every day | ORAL | Status: DC

## 2013-01-03 NOTE — Progress Notes (Signed)
History of Present Illness:  The patient has returned for reevaluation of diarrhea.  She's has immediate post prandial diarrhea accompanied by urgency.  She occasionally awakens b/c of diarrhea.  Sxs are independent of types of food.  Question of proctitis was raised by colonoscopy in 2013.  Biopsies demonstrated scattered lymphoid aggregates only.  She denies bleeding.    Review of Systems: Pertinent positive and negative review of systems were noted in the above HPI section. All other review of systems were otherwise negative.    Current Medications, Allergies, Past Medical History, Past Surgical History, Family History and Social History were reviewed in [employer] electronic medical record  Vital signs were reviewed in today's medical record. Physical Exam: General: Well developed , well nourished, no acute distress Skin: anicteric Head: Normocephalic and atraumatic Eyes:  sclerae anicteric, EOMI Ears: Normal auditory acuity Mouth: No deformity or lesions Lungs: Clear throughout to auscultation Heart: Regular rate and rhythm; no murmurs, rubs or bruits Abdomen: Soft, non tender and non distended. No masses, hepatosplenomegaly or hernias noted. Normal Bowel sounds Rectal:deferred Musculoskeletal: Symmetrical with no gross deformities  Pulses:  Normal pulses noted Extremities: No clubbing, cyanosis, edema or deformities noted Neurological: Alert oriented x 4, grossly nonfocal Psychological:  Alert and cooperative. Normal mood and affect

## 2013-01-03 NOTE — Patient Instructions (Addendum)
We are giving you Lialda samples today, you will take 2 every morning for 2 weeks Call back in one week and let us know how your doing and follow up in one month

## 2013-01-03 NOTE — Assessment & Plan Note (Signed)
Persistent complains probably 2 to IBS.  Doubt infection.  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  Plan - empiric trial of lialda 2.4gm qd; if not improved will try Lotronex

## 2013-02-04 ENCOUNTER — Encounter: Payer: Self-pay | Admitting: Gastroenterology

## 2013-02-04 ENCOUNTER — Ambulatory Visit (INDEPENDENT_AMBULATORY_CARE_PROVIDER_SITE_OTHER): Payer: BC Managed Care – PPO | Admitting: Gastroenterology

## 2013-02-04 VITALS — BP 100/60 | HR 84 | Ht 68.0 in | Wt 283.0 lb

## 2013-02-04 DIAGNOSIS — R51 Headache: Secondary | ICD-10-CM

## 2013-02-04 DIAGNOSIS — K589 Irritable bowel syndrome without diarrhea: Secondary | ICD-10-CM

## 2013-02-04 NOTE — Progress Notes (Signed)
History of Present Illness:  The patient has returned now complaining of constipation. She has to use a suppository in order to have a bowel movement. She may go several days without a BM. With constipation she has abdominal discomfort.    Review of Systems: Pertinent positive and negative review of systems were noted in the above HPI section. All other review of systems were otherwise negative.    Current Medications, Allergies, Past Medical History, Past Surgical History, Family History and Social History were reviewed in [employer] electronic medical record  Vital signs were reviewed in today's medical record. Physical Exam: General: She is an obese female in no acute distress

## 2013-02-04 NOTE — Assessment & Plan Note (Addendum)
The patient now has constipation where as her prior complaint was diarrhea. At this point I strongly suspect that she has IBS underlying her bowel complaints.  Recommendations #1 begin fiber supplementation daily.  She was instructed to try to avoid both constipating agents and bowel stimulants since she is likely to over shoot

## 2013-02-04 NOTE — Patient Instructions (Addendum)
Dr. Arlyce Dice wants to see you in 6 weeks. We made you an appointment for 03-13-2013 at 3:00 PM.  Take a fiber supplement daily, such as Metamucil.

## 2013-02-06 ENCOUNTER — Ambulatory Visit: Payer: Self-pay | Admitting: Gynecology

## 2013-02-14 ENCOUNTER — Ambulatory Visit: Payer: Self-pay | Admitting: Gynecology

## 2013-03-06 ENCOUNTER — Telehealth: Payer: Self-pay | Admitting: *Deleted

## 2013-03-06 ENCOUNTER — Ambulatory Visit: Payer: BC Managed Care – PPO | Admitting: Gastroenterology

## 2013-03-06 NOTE — Telephone Encounter (Signed)
I called and left a message for the patient. I advised I had made her an appointment for 03-13-2013 at 3 PM and I had to cancel it. Dr. Arlyce Dice would not be in the office at afternoon.  I asked her to please call me and i would make her a follow up appointment.  I asked her to call and ask for me Elita Quick ).

## 2013-03-11 NOTE — Telephone Encounter (Signed)
I did speak to the patient. She did get my message she forgot to call me back.  She is doing well right now.  I advised her to please call  us if she needs an appointment with either Dr. Arlyce Dice or the PA or Nurse Practioner.

## 2013-03-13 ENCOUNTER — Ambulatory Visit: Payer: BC Managed Care – PPO | Admitting: Gastroenterology

## 2013-03-18 ENCOUNTER — Ambulatory Visit: Payer: Self-pay | Admitting: Gynecology

## 2013-04-02 ENCOUNTER — Ambulatory Visit: Payer: BC Managed Care – PPO | Admitting: Family Medicine

## 2013-05-06 ENCOUNTER — Encounter: Payer: Self-pay | Admitting: Gynecology

## 2013-05-06 ENCOUNTER — Ambulatory Visit (INDEPENDENT_AMBULATORY_CARE_PROVIDER_SITE_OTHER): Payer: BC Managed Care – PPO | Admitting: Gynecology

## 2013-05-06 DIAGNOSIS — N949 Unspecified condition associated with female genital organs and menstrual cycle: Secondary | ICD-10-CM

## 2013-05-06 DIAGNOSIS — R102 Pelvic and perineal pain: Secondary | ICD-10-CM

## 2013-05-06 DIAGNOSIS — Z113 Encounter for screening for infections with a predominantly sexual mode of transmission: Secondary | ICD-10-CM

## 2013-05-06 DIAGNOSIS — N938 Other specified abnormal uterine and vaginal bleeding: Secondary | ICD-10-CM

## 2013-05-06 DIAGNOSIS — N898 Other specified noninflammatory disorders of vagina: Secondary | ICD-10-CM

## 2013-05-06 LAB — WET PREP FOR TRICH, YEAST, CLUE: Trich, Wet Prep: NONE SEEN

## 2013-05-06 LAB — PREGNANCY, URINE: Preg Test, Ur: NEGATIVE

## 2013-05-06 MED ORDER — MEGESTROL ACETATE 40 MG PO TABS: 40.0000 mg | ORAL_TABLET | Freq: Two times a day (BID) | ORAL | Status: DC

## 2013-05-06 MED ORDER — METRONIDAZOLE 500 MG PO TABS: 500.0000 mg | ORAL_TABLET | Freq: Two times a day (BID) | ORAL | Status: DC

## 2013-05-06 MED ORDER — TRAMADOL HCL 50 MG PO TABS: 50.0000 mg | ORAL_TABLET | Freq: Four times a day (QID) | ORAL | Status: DC | PRN

## 2013-05-06 NOTE — Patient Instructions (Addendum)
Bacterial Vaginosis Bacterial vaginosis (BV) is a vaginal infection where the normal balance of bacteria in the vagina is disrupted. The normal balance is then replaced by an overgrowth of certain bacteria. There are several different kinds of bacteria that can cause BV. BV is the most common vaginal infection in women of childbearing age. CAUSES   The cause of BV is not fully understood. BV develops when there is an increase or imbalance of harmful bacteria.  Some activities or behaviors can upset the normal balance of bacteria in the vagina and put women at increased risk including:  Having a new sex partner or multiple sex partners.  Douching.  Using an intrauterine device (IUD) for contraception.  It is not clear what role sexual activity plays in the development of BV. However, women that have never had sexual intercourse are rarely infected with BV. Women do not get BV from toilet seats, bedding, swimming pools or from touching objects around them.  SYMPTOMS   Grey vaginal discharge.  A fish-like odor with discharge, especially after sexual intercourse.  Itching or burning of the vagina and vulva.  Burning or pain with urination.  Some women have no signs or symptoms at all. DIAGNOSIS  Your caregiver must examine the vagina for signs of BV. Your caregiver will perform lab tests and look at the sample of vaginal fluid through a microscope. They will look for bacteria and abnormal cells (clue cells), a pH test higher than 4.5, and a positive amine test all associated with BV.  RISKS AND COMPLICATIONS   Pelvic inflammatory disease (PID).  Infections following gynecology surgery.  Developing HIV.  Developing herpes virus. TREATMENT  Sometimes BV will clear up without treatment. However, all women with symptoms of BV should be treated to avoid complications, especially if gynecology surgery is planned. Female partners generally do not need to be treated. However, BV may spread  between female sex partners so treatment is helpful in preventing a recurrence of BV.   BV may be treated with antibiotics. The antibiotics come in either pill or vaginal cream forms. Either can be used with nonpregnant or pregnant women, but the recommended dosages differ. These antibiotics are not harmful to the baby.  BV can recur after treatment. If this happens, a second round of antibiotics will often be prescribed.  Treatment is important for pregnant women. If not treated, BV can cause a premature delivery, especially for a pregnant woman who had a premature birth in the past. All pregnant women who have symptoms of BV should be checked and treated.  For chronic reoccurrence of BV, treatment with a type of prescribed gel vaginally twice a week is helpful. HOME CARE INSTRUCTIONS   Finish all medication as directed by your caregiver.  Do not have sex until treatment is completed.  Tell your sexual partner that you have a vaginal infection. They should see their caregiver and be treated if they have problems, such as a mild rash or itching.  Practice safe sex. Use condoms. Only have 1 sex partner. PREVENTION  Basic prevention steps can help reduce the risk of upsetting the natural balance of bacteria in the vagina and developing BV:  Do not have sexual intercourse (be abstinent).  Do not douche.  Use all of the medicine prescribed for treatment of BV, even if the signs and symptoms go away.  Tell your sex partner if you have BV. That way, they can be treated, if needed, to prevent reoccurrence. SEEK MEDICAL CARE IF:     Your symptoms are not improving after 3 days of treatment.  You have increased discharge, pain, or fever. MAKE SURE YOU:   Understand these instructions.  Will watch your condition.  Will get help right away if you are not doing well or get worse. FOR MORE INFORMATION  Division of STD Prevention (DSTDP), Centers for Disease Control and Prevention:  www.cdc.gov/std American [employer] Association (ASHA): www.ashastd.org  Document Released: 08/01/2005 Document Revised: 10/24/2011 Document Reviewed: 01/22/2009 Exit[employer] Patient Information 2014 [employer].  

## 2013-05-06 NOTE — Progress Notes (Signed)
32 year old who presented to the office today with reported dysfunction uterine bleeding. She stated she started bleeding September 3 as been on and off with passage of blood clots along with right lower quadrant discomfort and back discomfort. She stated has decreased in the past 2 days. She had a Mirena IUD placed in 2013. In February of this year she was seen with similar situation in the working diagnosis was possibly a mild endometritis and she was placed on Vigamox and 100 mg twice a day for one week. She had been given estradiol 1 mg daily for 30 days to help build up her endometrium and it did stop her bleeding until this episode in September.  Urine pregnancy test today was negative   Exam: Abdomen soft minimal tenderness in right lower quadrant no rebound or guarding. No back CVA tenderness  Bartholin urethra Skene was within normal limits Vagina:creamy brownish discharge with foul odor was noted. GC and chlamydia culture was obtained Cervix: IUD string seen  Wet prep few yeast, positive Amine many clue cell, moderate white blood cells and too numerous to count bacteria  Assessment/plan: Bacterial vaginosis will be treated with Flagyl 500 mg twice a day for 5 days. She'll be prescribed Megace 40 mg twice a day for 10 days to stop her bleeding. She'll return back to the office in one week for an ultrasound. She was prescribed Ultram 50 mg take 1 by mouth every 6 hours for abdominal discomfort. GC and chlamydia culture pending at time of this dictation.

## 2013-05-15 ENCOUNTER — Ambulatory Visit (INDEPENDENT_AMBULATORY_CARE_PROVIDER_SITE_OTHER): Payer: BC Managed Care – PPO

## 2013-05-15 ENCOUNTER — Encounter: Payer: Self-pay | Admitting: Family Medicine

## 2013-05-15 ENCOUNTER — Ambulatory Visit (INDEPENDENT_AMBULATORY_CARE_PROVIDER_SITE_OTHER): Payer: BC Managed Care – PPO | Admitting: Family Medicine

## 2013-05-15 ENCOUNTER — Ambulatory Visit (INDEPENDENT_AMBULATORY_CARE_PROVIDER_SITE_OTHER): Payer: BC Managed Care – PPO | Admitting: Gynecology

## 2013-05-15 VITALS — BP 110/72 | HR 60 | Temp 98.3°F | Ht 68.0 in | Wt 277.5 lb

## 2013-05-15 DIAGNOSIS — L723 Sebaceous cyst: Secondary | ICD-10-CM | POA: Insufficient documentation

## 2013-05-15 DIAGNOSIS — R102 Pelvic and perineal pain: Secondary | ICD-10-CM

## 2013-05-15 DIAGNOSIS — N949 Unspecified condition associated with female genital organs and menstrual cycle: Secondary | ICD-10-CM

## 2013-05-15 DIAGNOSIS — N831 Corpus luteum cyst of ovary, unspecified side: Secondary | ICD-10-CM

## 2013-05-15 DIAGNOSIS — N938 Other specified abnormal uterine and vaginal bleeding: Secondary | ICD-10-CM

## 2013-05-15 DIAGNOSIS — L909 Atrophic disorder of skin, unspecified: Secondary | ICD-10-CM

## 2013-05-15 DIAGNOSIS — L918 Other hypertrophic disorders of the skin: Secondary | ICD-10-CM | POA: Insufficient documentation

## 2013-05-15 NOTE — Patient Instructions (Addendum)
I think you have a cyst under left arm- watch for growth or redness and let me know  We treated skin tags under right arm - with liquid nitrogen- keep areas clean and these should fall off in the next 2 weeks  Keep me posted if any problems

## 2013-05-15 NOTE — Progress Notes (Signed)
Patient presented to the office today to discuss ultrasound as part of her evaluation for dysfunctional uterine bleeding with IUD Mirena having been placed in 2013.  Patient was seen in the office 05/06/2013 because of dysfunctional uterine bleeding. See previous note for detail. She had been treated in the past for suspected mild endometritis as a result of the IUD had been placed on Vibramycin 100 mg twice a day for one week. She also had been given estradiol 1 mg daily for 30 days to help build up her endometriu but she reported that it did not stop her irregular bleeding.  Last office visit she was treated with Flagyl 500 mg twice a day for 5 days for bacterial vaginosis and had been prescribed Megace 40 mg for 10 days. Patient reports no further bleeding and is doing well otherwise and is to discuss the ultrasound.  Ultrasound today: Uterus measuring 9.6 x 6.5 x 5.3 cm with endometrial stripe of 4.2 mm. Posterior fibroid intramural measuring 18 x 21 mm. IUD was seen in the endometrial cavity in the right position. Small 21 x 20 x 15 corpus luteum cyst with positive flow in the periphery of the cyst. Left ovary was normal. Fluid in the cul-de-sac was noted.  Assessment/plan: Patient with apparent ovarian cyst may have contributed to the extra estrogen and indirectly contributing to the dysfunctional uterine bleeding. Ultrasound today essentially unremarkable with what appears to have been fluid in the cul-de-sac from ruptured cyst. IUD in the proper position. We will repeat an ultrasound in 3 or 4 months. She scheduled to return to the office at the end of the month for her angle exam.

## 2013-05-15 NOTE — Patient Instructions (Signed)
Ovarian Cyst  The ovaries are small organs that are on each side of the uterus. The ovaries are the organs that produce the female hormones, estrogen and progesterone. An ovarian cyst is a sac filled with fluid that can vary in its size. It is normal for a small cyst to form in women who are in the childbearing age and who have menstrual periods. This type of cyst is called a follicle cyst that becomes an ovulation cyst (corpus luteum cyst) after it produces the women's egg. It later goes away on its own if the woman does not become pregnant. There are other kinds of ovarian cysts that may cause problems and may need to be treated. The most serious problem is a cyst with cancer. It should be noted that menopausal women who have an ovarian cyst are at a higher risk of it being a cancer cyst. They should be evaluated very quickly, thoroughly and followed closely. This is especially true in menopausal women because of the high rate of ovarian cancer in women in menopause.  CAUSES AND TYPES OF OVARIAN CYSTS:   FUNCTIONAL CYST: The follicle/corpus luteum cyst is a functional cyst that occurs every month during ovulation with the menstrual cycle. They go away with the next menstrual cycle if the woman does not get pregnant. Usually, there are no symptoms with a functional cyst.   ENDOMETRIOMA CYST: This cyst develops from the lining of the uterus tissue. This cyst gets in or on the ovary. It grows every month from the bleeding during the menstrual period. It is also called a "chocolate cyst" because it becomes filled with blood that turns brown. This cyst can cause pain in the lower abdomen during intercourse and with your menstrual period.   CYSTADENOMA CYST: This cyst develops from the cells on the outside of the ovary. They usually are not cancerous. They can get very big and cause lower abdomen pain and pain with intercourse. This type of cyst can twist on itself, cut off its blood supply and cause severe pain. It  also can easily rupture and cause a lot of pain.   DERMOID CYST: This type of cyst is sometimes found in both ovaries. They are found to have different kinds of body tissue in the cyst. The tissue includes skin, teeth, hair, and/or cartilage. They usually do not have symptoms unless they get very big. Dermoid cysts are rarely cancerous.   POLYCYSTIC OVARY: This is a rare condition with hormone problems that produces many small cysts on both ovaries. The cysts are follicle-like cysts that never produce an egg and become a corpus luteum. It can cause an increase in body weight, infertility, acne, increase in body and facial hair and lack of menstrual periods or rare menstrual periods. Many women with this problem develop type 2 diabetes. The exact cause of this problem is unknown. A polycystic ovary is rarely cancerous.   THECA LUTEIN CYST: Occurs when too much hormone (human chorionic gonadotropin) is produced and over-stimulates the ovaries to produce an egg. They are frequently seen when doctors stimulate the ovaries for invitro-fertilization (test tube babies).   LUTEOMA CYST: This cyst is seen during pregnancy. Rarely it can cause an obstruction to the birth canal during labor and delivery. They usually go away after delivery.  SYMPTOMS    Pelvic pain or pressure.   Pain during sexual intercourse.   Increasing girth (swelling) of the abdomen.   Abnormal menstrual periods.   Increasing pain with menstrual periods.     You stop having menstrual periods and you are not pregnant.  DIAGNOSIS   The diagnosis can be made during:   Routine or annual pelvic examination (common).   Ultrasound.   X-ray of the pelvis.   CT Scan.   MRI.   Blood tests.  TREATMENT    Treatment may only be to follow the cyst monthly for 2 to 3 months with your caregiver. Many go away on their own, especially functional cysts.   May be aspirated (drained) with a long needle with ultrasound, or by laparoscopy (inserting a tube into  the pelvis through a small incision).   The whole cyst can be removed by laparoscopy.   Sometimes the cyst may need to be removed through an incision in the lower abdomen.   Hormone treatment is sometimes used to help dissolve certain cysts.   Birth control pills are sometimes used to help dissolve certain cysts.  HOME CARE INSTRUCTIONS   Follow your caregiver's advice regarding:   Medicine.   Follow up visits to evaluate and treat the cyst.   You may need to come back or make an appointment with another caregiver, to find the exact cause of your cyst, if your caregiver is not a gynecologist.   Get your yearly and recommended pelvic examinations and Pap tests.   Let your caregiver know if you have had an ovarian cyst in the past.  SEEK MEDICAL CARE IF:    Your periods are late, irregular, they stop, or are painful.   Your stomach (abdomen) or pelvic pain does not go away.   Your stomach becomes larger or swollen.   You have pressure on your bladder or trouble emptying your bladder completely.   You have painful sexual intercourse.   You have feelings of fullness, pressure, or discomfort in your stomach.   You lose weight for no apparent reason.   You feel generally ill.   You become constipated.   You lose your appetite.   You develop acne.   You have an increase in body and facial hair.   You are gaining weight, without changing your exercise and eating habits.   You think you are pregnant.  SEEK IMMEDIATE MEDICAL CARE IF:    You have increasing abdominal pain.   You feel sick to your stomach (nausea) and/or vomit.   You develop a fever that comes on suddenly.   You develop abdominal pain during a bowel movement.   Your menstrual periods become heavier than usual.  Document Released: 08/01/2005 Document Revised: 10/24/2011 Document Reviewed: 06/04/2009  [employer] Patient Information 2014 [employer].

## 2013-05-15 NOTE — Progress Notes (Signed)
Subjective:    Patient ID: Brittney [name], female    DOB: 1981/02/09, 32 y.o.   MRN: [id number]  HPI Here for knots under L arm and tags under R arm   On L - feels like knots Non tender  ? How long they have been there Not red and no drainage  No breast lumps on that side   Right side - irritated skin tags    Patient Active Problem List   Diagnosis Date Noted  . Cutaneous skin tags 05/15/2013  . Sebaceous cyst of left axilla 05/15/2013  . DUB (dysfunctional uterine bleeding) 09/24/2012  . Endometritis 09/24/2012  . Viral URI 09/17/2012  . Abrasion of left ear 09/17/2012  . 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 EDEMA 11/04/2009  . WEIGHT GAIN 07/23/2008  . OBESITY 03/31/2008  . HEARING LOSS, MILD 03/31/2008  . MIGRAINE HEADACHE 02/20/2007  . IBS 02/20/2007  . OSTEOARTHRITIS 02/20/2007   Past Medical History  Diagnosis Date  . Irritable bowel syndrome   . Obesity, unspecified   . Fibroids   . Migraine   . Anemia    Past Surgical History  Procedure Laterality Date  . Colonoscopy  11/2001    polyp; negative pathology  . Colonoscopy  09/2004    Negative  . Uterine US  09/2005    Large endometrial stripe; small ovarian cyst  . Laparoscopic decortication / dubulking / ablation renal cysts      saline histogram  . Tonsillectomy  07/30/07  . Esophagogastroduodenoscopy  1/10    normal  . Colonoscopy w/ polypectomy    . Cholecystectomy  05/06/11  . Flexible sigmoidoscopy  03/05/2012    Procedure: FLEXIBLE SIGMOIDOSCOPY;  Surgeon: Louis Meckel, MD;  Location: [facility];  Service: Endoscopy;  Laterality: N/A;   History  Substance Use Topics  . Smoking status: Never Smoker   . Smokeless tobacco: Never Used  . Alcohol Use: No     Comment: rare   Family History  Problem Relation Age of Onset  . Ulcers Brother   . Clotting disorder Brother   . Colon cancer Maternal Grandmother   . Ovarian cancer Maternal Grandmother   . Diabetes Paternal Grandmother    . Hypertension Paternal Grandmother   . Diabetes Paternal Aunt   . Heart disease Father   . Hypertension Father   . Breast cancer Maternal Aunt     Age 41's  . Diabetes Paternal Aunt   . Diabetes Paternal Aunt   . Diabetes Paternal Aunt    Allergies  Allergen Reactions  . Lansoprazole     REACTION: rash   Current Outpatient Prescriptions on File Prior to Visit  Medication Sig Dispense Refill  . levonorgestrel (MIRENA) 20 MCG/24HR IUD 1 each by Intrauterine route once.      Marland Kitchen Hyoscyamine Sulfate 0.375 MG TBCR Take one tab twice a day as needed for abdominal pain and cramps  25 tablet  1  . megestrol (MEGACE) 40 MG tablet Take 1 tablet (40 mg total) by mouth 2 (two) times daily.  20 tablet  1  . metroNIDAZOLE (FLAGYL) 500 MG tablet Take 1 tablet (500 mg total) by mouth 2 (two) times daily.  10 tablet  0  . ranitidine (ZANTAC) 150 MG capsule Take 150 mg by mouth as needed.       . SUMAtriptan (IMITREX) 100 MG tablet Take 1 tablet (100 mg total) by mouth every 2 (two) hours as needed for migraine.  10 tablet  3  .  traMADol (ULTRAM) 50 MG tablet Take 1 tablet (50 mg total) by mouth every 6 (six) hours as needed for pain.  30 tablet  1   No current facility-administered medications on file prior to visit.    Review of Systems Review of Systems  Constitutional: Negative for fever, appetite change, fatigue and unexpected weight change.  Eyes: Negative for pain and visual disturbance.  Respiratory: Negative for cough and shortness of breath.   Cardiovascular: Negative for cp or palpitations    Gastrointestinal: Negative for nausea, diarrhea and constipation.  Genitourinary: Negative for urgency and frequency.  Skin: Negative for pallor or rash   Neurological: Negative for weakness, light-headedness, numbness and headaches.  Hematological: Negative for adenopathy. Does not bruise/bleed easily.  Psychiatric/Behavioral: Negative for dysphoric mood. The patient is not nervous/anxious.          Objective:   Physical Exam  Constitutional: She appears well-developed and well-nourished. No distress.  obese and well appearing   HENT:  Head: Normocephalic and atraumatic.  Eyes: Conjunctivae and EOM are normal. Pupils are equal, round, and reactive to light. Right eye exhibits no discharge. Left eye exhibits no discharge. No scleral icterus.  Neck: Normal range of motion. Neck supple.  Cardiovascular: Normal rate, regular rhythm and intact distal pulses.   Pulmonary/Chest: Effort normal and breath sounds normal. No respiratory distress. She has no wheezes.  Abdominal: Soft. Bowel sounds are normal. She exhibits no distension and no mass. There is no tenderness.  Genitourinary: No breast swelling, tenderness, discharge or bleeding.  Breast exam: No mass, nodules, thickening, tenderness, bulging, retraction, inflamation, nipple discharge or skin changes noted.  No axillary or clavicular LA.  Chaperoned exam.    Musculoskeletal: She exhibits no edema.  Lymphadenopathy:    She has no cervical adenopathy.  Neurological: She is alert. She has normal reflexes.  Skin: Skin is warm and dry. No rash noted.  R axilla - 3 skin tags- flesh colored and irritated 1-3 mm  L axilla- .5 cm cyst under skin without redness or d/c No LN palp  Psychiatric: She has a normal mood and affect.          Assessment & Plan:

## 2013-05-16 NOTE — Assessment & Plan Note (Signed)
R axilla They were cleaned and tx with liquid nitrogen Disc expectations and care

## 2013-05-16 NOTE — Assessment & Plan Note (Signed)
Non infected  Non symptomatic Will watch Recommend warm comp if sore Will call if redness or signs of infection

## 2013-06-05 ENCOUNTER — Encounter: Payer: Self-pay | Admitting: Gynecology

## 2013-06-10 ENCOUNTER — Ambulatory Visit (INDEPENDENT_AMBULATORY_CARE_PROVIDER_SITE_OTHER): Payer: BC Managed Care – PPO | Admitting: Family Medicine

## 2013-06-10 ENCOUNTER — Encounter: Payer: Self-pay | Admitting: Family Medicine

## 2013-06-10 DIAGNOSIS — H6690 Otitis media, unspecified, unspecified ear: Secondary | ICD-10-CM

## 2013-06-10 MED ORDER — FLUCONAZOLE 150 MG PO TABS: 150.0000 mg | ORAL_TABLET | Freq: Once | ORAL | Status: DC

## 2013-06-10 MED ORDER — FLUTICASONE PROPIONATE 50 MCG/ACT NA SUSP: 2.0000 | Freq: Every day | NASAL | Status: DC

## 2013-06-10 MED ORDER — AMOXICILLIN 500 MG PO CAPS: 500.0000 mg | ORAL_CAPSULE | Freq: Three times a day (TID) | ORAL | Status: DC

## 2013-06-10 NOTE — Progress Notes (Signed)
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  Took some aspirin and tylenol.  ST, scratchy.  Some cough.  Frontal HA.  Neck is sore.  No fevers.  No sputum.  No aches o/w.  No rash.  No rhinorrhea, not stuffy.  Cough seems to be triggered in the throat.  HA is most bothersome. Less HA after a hot shower.    Meds, vitals, and allergies reviewed.   ROS: See HPI.  Otherwise, noncontributory.  GEN: nad, alert and oriented HEENT: mucous membranes moist, R tm w/o erythema, L TM red, nasal exam w/o erythema, clear discharge noted,  OP with cobblestoning, frontal sinuses ttp but max sinuses not ttp NECK: supple w/o LA CV: rrr.   PULM: ctab, no inc wob EXT: no edema SKIN: no acute rash

## 2013-06-10 NOTE — Assessment & Plan Note (Signed)
Amoxil for AOM, diflucan if needed.  Use flonase for sinus pressure and f/u prn.  Nontoxic. D/w pt.

## 2013-06-10 NOTE — Patient Instructions (Signed)
Start the amoxil today, use the flonase for the nasal/facial pressure and use the diflucan if needed.  Take care.  Try to get some rest.

## 2013-07-05 ENCOUNTER — Emergency Department: Payer: Self-pay | Admitting: Emergency Medicine

## 2013-07-05 LAB — CBC
HCT: 43.2 %
HGB: 14.2 g/dL
MCH: 26.9 pg
MCHC: 32.9 g/dL
MCV: 82 fL
Platelet: 264 x10 3/mm 3
RBC: 5.29 X10 6/mm 3 — ABNORMAL HIGH
RDW: 15.6 % — ABNORMAL HIGH
WBC: 9.4 x10 3/mm 3

## 2013-07-05 LAB — BASIC METABOLIC PANEL WITH GFR
Anion Gap: 2 — ABNORMAL LOW
BUN: 12 mg/dL
Calcium, Total: 9.1 mg/dL
Chloride: 109 mmol/L — ABNORMAL HIGH
Co2: 23 mmol/L
Creatinine: 0.7 mg/dL
EGFR (African American): >60
EGFR (Non-African Amer.): >60
Glucose: 79 mg/dL
Osmolality: 267
Potassium: 5.5 mmol/L — ABNORMAL HIGH
Sodium: 134 mmol/L — ABNORMAL LOW

## 2013-07-05 LAB — CK TOTAL AND CKMB (NOT AT ARMC)
CK, Total: 149 U/L
CK-MB: 0.5 ng/mL

## 2013-07-05 LAB — APTT: Activated PTT: 35.4 s

## 2013-07-05 LAB — PROTIME-INR
INR: 1
Prothrombin Time: 13.4 s

## 2013-07-05 LAB — TROPONIN I: Troponin-I: <0.02 ng/mL

## 2013-07-08 ENCOUNTER — Encounter: Payer: Self-pay | Admitting: *Deleted

## 2013-07-09 ENCOUNTER — Ambulatory Visit (INDEPENDENT_AMBULATORY_CARE_PROVIDER_SITE_OTHER): Payer: BC Managed Care – PPO | Admitting: Cardiovascular Disease

## 2013-07-09 ENCOUNTER — Other Ambulatory Visit: Payer: Self-pay | Admitting: Cardiovascular Disease

## 2013-07-09 ENCOUNTER — Encounter: Payer: Self-pay | Admitting: Cardiovascular Disease

## 2013-07-09 ENCOUNTER — Other Ambulatory Visit (INDEPENDENT_AMBULATORY_CARE_PROVIDER_SITE_OTHER): Payer: BC Managed Care – PPO

## 2013-07-09 ENCOUNTER — Other Ambulatory Visit: Payer: Self-pay

## 2013-07-09 VITALS — BP 121/83 | HR 67 | Ht 68.0 in | Wt 284.2 lb

## 2013-07-09 DIAGNOSIS — R079 Chest pain, unspecified: Secondary | ICD-10-CM

## 2013-07-09 MED ORDER — ASPIRIN 325 MG PO TABS: 325.0000 mg | ORAL_TABLET | Freq: Every day | ORAL | Status: DC

## 2013-07-09 NOTE — Progress Notes (Signed)
HPI  This is a pleasant 32 year old African American female who was referred from the emergency room at [facility] for evaluation of chest pain. She has no previous cardiac history. 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 She is on long-term birth control with an IUD. She has no history of hypertension, hyperlipidemia, diabetes or tobacco use. She does have family history of premature coronary artery disease. Father had myocardial infarction and CABG in his 49s. Last Friday, she had a prolonged episode of sharp substernal chest pain radiating to her back which lasted for hours. This happened at rest. She was taken to the emergency room at [facility]. EKG showed borderline LVH criteria with no significant ST or T wave changes. Troponin was negative. D-dimer was normal. Labs were unremarkable except for a potassium of 5.5. Chest x-ray shows no acute abnormalities. CTA of the chest showed no evidence of pulmonary embolism or aortic dissection. Since then, she reports no further episodes of chest pain or dyspnea. Her only complaint at this time is fatigue. There is no orthopnea or PND.  Allergies  Allergen Reactions  . Lansoprazole     REACTION: rash     Current Outpatient Prescriptions on File Prior to Visit  Medication Sig Dispense Refill  . Hyoscyamine Sulfate 0.375 MG TBCR Take one tab twice a day as needed for abdominal pain and cramps  25 tablet  1  . levonorgestrel (MIRENA) 20 MCG/24HR IUD 1 each by Intrauterine route once.      . ranitidine (ZANTAC) 150 MG capsule Take 150 mg by mouth as needed.       . SUMAtriptan (IMITREX) 100 MG tablet Take 1 tablet (100 mg total) by mouth every 2 (two) hours as needed for migraine.  10 tablet  3   No current facility-administered medications on file prior to visit.     Past Medical History  Diagnosis Date  . Irritable bowel syndrome   . Obesity, unspecified   . Fibroids   . Migraine   . Anemia      Past  Surgical History  Procedure Laterality Date  . Colonoscopy  11/2001    polyp; negative pathology  . Colonoscopy  09/2004    Negative  . Uterine US  09/2005    Large endometrial stripe; small ovarian cyst  . Laparoscopic decortication / dubulking / ablation renal cysts      saline histogram  . Tonsillectomy  07/30/07  . Esophagogastroduodenoscopy  1/10    normal  . Colonoscopy w/ polypectomy    . Cholecystectomy  05/06/11  . Flexible sigmoidoscopy  03/05/2012    Procedure: FLEXIBLE SIGMOIDOSCOPY;  Surgeon: Louis Meckel, MD;  Location: [facility];  Service: Endoscopy;  Laterality: N/A;     Family History  Problem Relation Age of Onset  . Ulcers Brother   . Clotting disorder Brother   . Colon cancer Maternal Grandmother   . Ovarian cancer Maternal Grandmother   . Diabetes Paternal Grandmother   . Hypertension Paternal Grandmother   . Diabetes Paternal Aunt   . Heart disease Father   . Hypertension Father   . Heart attack Father   . Breast cancer Maternal Aunt     Age 48's  . Diabetes Paternal Aunt   . Diabetes Paternal Aunt   . Diabetes Paternal Aunt      History   Social History  . Marital Status: Married    Spouse Name: N/A  Number of Children: 2  . Years of Education: N/A   Occupational History  . Teacher    Social History Main Topics  . Smoking status: Never Smoker   . Smokeless tobacco: Never Used  . Alcohol Use: No     Comment: rare  . Drug Use: No  . Sexual Activity: Yes    Birth Control/ Protection: Condom, IUD   Other Topics Concern  . 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 Use-soda           ROS A 10 point review of system was performed. It is negative other than that mentioned in the history of present illness.   PHYSICAL EXAM   BP 121/83  Pulse 67  Ht 5\' 8"  (1.727 m)  Wt 284 lb 4 oz (128.935 kg)  BMI 43.23 kg/m2 Constitutional: She is oriented to person,  place, and time. She appears well-developed and well-nourished. No distress.  HENT: No nasal discharge.  Head: Normocephalic and atraumatic.  Eyes: Pupils are equal and round. No discharge.  Neck: Normal range of motion. Neck supple. No JVD present. No thyromegaly present.  Cardiovascular: Normal rate, regular rhythm, normal heart sounds. Exam reveals no gallop and no friction rub. No murmur heard.  Pulmonary/Chest: Effort normal and breath sounds normal. No stridor. No respiratory distress. She has no wheezes. She has no rales. She exhibits no tenderness.  Abdominal: Soft. Bowel sounds are normal. She exhibits no distension. There is no tenderness. There is no rebound and no guarding.  Musculoskeletal: Normal range of motion. She exhibits no edema and no tenderness.  Neurological: She is alert and oriented to person, place, and time. Coordination normal.  Skin: Skin is warm and dry. No rash noted. She is not diaphoretic. No erythema. No pallor.  Psychiatric: She has a normal mood and affect. Her behavior is normal. Judgment and thought content normal.     EKG: Normal sinus rhythm with 1 mm ST elevation in lead 1 and aVL with minor ST depression in 3 and aVF.   ASSESSMENT AND PLAN

## 2013-07-09 NOTE — Assessment & Plan Note (Signed)
The chest pain is overall atypical and she has not had any recurrent symptoms since Friday. However, her EKG is surprisingly abnormal with possible lateral injury with ST elevation in lead 1 and aVL with minor ST depression in leads 3 and aVF. The patient completely denies any chest pain or dyspnea or any other symptoms suggestive of myocardial ischemia at this time other than fatigue. These EKG changes appear to be new. I repeated the EKG and it showed the same. I performed a stat echocardiogram which showed normal LV systolic function and wall motion. I sent her for a stat Toponin which also came back normal. Although her EKG is abnormal, she does not have symptoms at this point. I recommend aspirin 325 mg daily. I will arrange for a nuclear stress test to be done tomorrow. I asked her not to exert herself until further workup and to go to the emergency room if she develops any recurrent chest pain or dyspnea.

## 2013-07-09 NOTE — Patient Instructions (Addendum)
Start Aspirin 325 mg once daily.   Your physician has requested that you have en exercise stress myoview. For further information please visit [email or web address] Please follow instruction sheet, as given.  Check Stat Troponin.  Serum pregnancy test.  [facility] MYOVIEW  Your caregiver has ordered a Stress Test with nuclear imaging. The purpose of this test is to evaluate the blood supply to your heart muscle. This procedure is referred to as a "Non-Invasive Stress Test." This is because other than having an IV started in your vein, nothing is inserted or "invades" your body. Cardiac stress tests are done to find areas of poor blood flow to the heart by determining the extent of coronary artery disease (CAD). Some patients exercise on a treadmill, which naturally increases the blood flow to your heart, while others who are  unable to walk on a treadmill due to physical limitations have a pharmacologic/chemical stress agent called Lexiscan . This medicine will mimic walking on a treadmill by temporarily increasing your coronary blood flow.   Please note: these test may take anywhere between 2-4 hours to complete  PLEASE REPORT TO [facility] MEDICAL MALL ENTRANCE  THE VOLUNTEERS AT THE FIRST DESK WILL DIRECT YOU WHERE TO GO  Date of Procedure:_________Wednesday, Nov 26_________  Arrival Time for Procedure:__________8:15_________________  Instructions regarding medication:   PLEASE NOTIFY THE OFFICE AT LEAST 24 HOURS IN ADVANCE IF YOU ARE UNABLE TO KEEP YOUR APPOINTMENT.  [phone] AND  PLEASE NOTIFY NUCLEAR MEDICINE AT [facility] AT LEAST 24 HOURS IN ADVANCE IF YOU ARE UNABLE TO KEEP YOUR APPOINTMENT. [phone]  How to prepare for your Myoview test:  1. Do not eat or drink after midnight 2. No caffeine for 24 hours prior to test 3. No smoking 24 hours prior to test. 4. Your medication may be taken with water.  If your doctor stopped a medication because of this test, do not take that  medication. 5. Ladies, please do not wear dresses.  Skirts or pants are appropriate. Please wear a short sleeve shirt. 6. No perfume, cologne or lotion. 7. Wear comfortable walking shoes. No heels!

## 2013-07-10 ENCOUNTER — Ambulatory Visit: Payer: Self-pay | Admitting: Cardiovascular Disease

## 2013-07-10 ENCOUNTER — Telehealth: Payer: Self-pay | Admitting: *Deleted

## 2013-07-10 ENCOUNTER — Encounter: Payer: Self-pay | Admitting: Gynecology

## 2013-07-10 ENCOUNTER — Other Ambulatory Visit: Payer: Self-pay

## 2013-07-10 DIAGNOSIS — R079 Chest pain, unspecified: Secondary | ICD-10-CM

## 2013-07-10 NOTE — Telephone Encounter (Signed)
Patient called and wants to know when she needs a follow up appt?

## 2013-07-10 NOTE — Telephone Encounter (Signed)
Schedule a follow up appointment next week.

## 2013-07-10 NOTE — Telephone Encounter (Signed)
Per Dr Mariah Milling called pt to tell her stress test was normal. She wanted to know if she could follow up soon to ask a few questions about her ekg being abnormal.

## 2013-07-16 ENCOUNTER — Encounter: Payer: Self-pay | Admitting: Cardiovascular Disease

## 2013-07-16 ENCOUNTER — Ambulatory Visit (INDEPENDENT_AMBULATORY_CARE_PROVIDER_SITE_OTHER): Payer: BC Managed Care – PPO | Admitting: Cardiovascular Disease

## 2013-07-16 VITALS — BP 100/80 | HR 70 | Ht 68.0 in | Wt 286.5 lb

## 2013-07-16 DIAGNOSIS — R079 Chest pain, unspecified: Secondary | ICD-10-CM

## 2013-07-16 NOTE — Patient Instructions (Addendum)
Follow up in 3 months

## 2013-07-16 NOTE — Progress Notes (Signed)
HPI  This is a pleasant 32 year old [place] American female who is here today for a followup visit regarding  chest pain. She has no previous cardiac history. 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 She is on long-term birth control with an IUD. She has no history of hypertension, hyperlipidemia, diabetes or tobacco use. She does have family history of premature coronary artery disease. Father had myocardial infarction and CABG in his 8s. She had a recent episode of prolonged  sharp substernal chest pain radiating to her back which lasted for hours. This happened at rest. She was taken to the emergency room at [facility]. EKG showed borderline LVH criteria with no significant ST or T wave changes. Troponin was negative. D-dimer was normal. Labs were unremarkable except for a potassium of 5.5. Chest x-ray shows no acute abnormalities. CTA of the chest showed no evidence of pulmonary embolism or aortic dissection. She was seen last week and was chest pain-free. However, EKG showed 1 mm of ST elevation in 1 and aVL with minor ST depression in the inferior leads. We did an echocardiogram which showed normal LV systolic function and wall motion. She was asymptomatic at that time and thus I scheduled her for a treadmill nuclear stress test next day which showed no evidence of ischemia with normal ejection fraction. EKG was back to normal. Since then, she reports to moderate episodes of chest pain of the same quality. Overall she's feeling better.  Allergies  Allergen Reactions  . Lansoprazole     REACTION: rash     Current Outpatient Prescriptions on File Prior to Visit  Medication Sig Dispense Refill  . aspirin 325 MG tablet Take 1 tablet (325 mg total) by mouth daily.      Marland Kitchen Hyoscyamine Sulfate 0.375 MG TBCR Take one tab twice a day as needed for abdominal pain and cramps  25 tablet  1  . levonorgestrel (MIRENA) 20 MCG/24HR IUD 1 each by Intrauterine route  once.      . ranitidine (ZANTAC) 150 MG capsule Take 150 mg by mouth as needed.       . SUMAtriptan (IMITREX) 100 MG tablet Take 1 tablet (100 mg total) by mouth every 2 (two) hours as needed for migraine.  10 tablet  3   No current facility-administered medications on file prior to visit.     Past Medical History  Diagnosis Date  . Irritable bowel syndrome   . Obesity, unspecified   . Fibroids   . Migraine   . Anemia      Past Surgical History  Procedure Laterality Date  . Colonoscopy  11/2001    polyp; negative pathology  . Colonoscopy  09/2004    Negative  . Uterine US  09/2005    Large endometrial stripe; small ovarian cyst  . Laparoscopic decortication / dubulking / ablation renal cysts      saline histogram  . Tonsillectomy  07/30/07  . Esophagogastroduodenoscopy  1/10    normal  . Colonoscopy w/ polypectomy    . Cholecystectomy  05/06/11  . Flexible sigmoidoscopy  03/05/2012    Procedure: FLEXIBLE SIGMOIDOSCOPY;  Surgeon: Louis Meckel, MD;  Location: [facility];  Service: Endoscopy;  Laterality: N/A;     Family History  Problem Relation Age of Onset  . Ulcers Brother   . Clotting disorder Brother   . Colon cancer Maternal Grandmother   . Ovarian cancer Maternal Grandmother   .  Diabetes Paternal Grandmother   . Hypertension Paternal Grandmother   . Diabetes Paternal Aunt   . Heart disease Father   . Hypertension Father   . Heart attack Father   . Breast cancer Maternal Aunt     Age 3's  . Diabetes Paternal Aunt   . Diabetes Paternal Aunt   . Diabetes Paternal Aunt      History   Social History  . Marital Status: Married    Spouse Name: N/A    Number of Children: 2  . Years of Education: N/A   Occupational History  . Teacher    Social History Main Topics  . Smoking status: Never Smoker   . Smokeless tobacco: Never Used  . Alcohol Use: No     Comment: rare  . Drug Use: No  . Sexual Activity: Yes    Birth Control/ Protection: Condom, IUD    Other Topics Concern  . 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 Use-soda           ROS A 10 point review of system was performed. It is negative other than that mentioned in the history of present illness.   PHYSICAL EXAM   BP 100/80  Pulse 70  Ht 5\' 8"  (1.727 m)  Wt 286 lb 8 oz (129.956 kg)  BMI 43.57 kg/m2 Constitutional: She is oriented to person, place, and time. She appears well-developed and well-nourished. No distress.  HENT: No nasal discharge.  Head: Normocephalic and atraumatic.  Eyes: Pupils are equal and round. No discharge.  Neck: Normal range of motion. Neck supple. No JVD present. No thyromegaly present.  Cardiovascular: Normal rate, regular rhythm, normal heart sounds. Exam reveals no gallop and no friction rub. No murmur heard.  Pulmonary/Chest: Effort normal and breath sounds normal. No stridor. No respiratory distress. She has no wheezes. She has no rales. She exhibits no tenderness.  Abdominal: Soft. Bowel sounds are normal. She exhibits no distension. There is no tenderness. There is no rebound and no guarding.  Musculoskeletal: Normal range of motion. She exhibits no edema and no tenderness.  Neurological: She is alert and oriented to person, place, and time. Coordination normal.  Skin: Skin is warm and dry. No rash noted. She is not diaphoretic. No erythema. No pallor.  Psychiatric: She has a normal mood and affect. Her behavior is normal. Judgment and thought content normal.     ZOX:[id number]  Rhythm  Low voltage in precordial leads.  Borderline   ASSESSMENT AND PLAN

## 2013-07-16 NOTE — Assessment & Plan Note (Addendum)
The etiology of this is not entirely clear. Echocardiogram and nuclear stress test both were unremarkable. She did have an abnormal EKG last week which went back to normal by next day. EKG today is also normal. There is a possibility of coronary spasm as an explanation for her symptoms and transient EKG changes. Currently, her symptoms improved without intervention. She has been under stress which might be contributing. If symptoms become more frequent, I recommend adding long-acting nitroglycerin. I will have her followup with me in 3 months. She is not using any of her migraine medications which can lead to coronary spasm.

## 2013-07-29 ENCOUNTER — Telehealth: Payer: Self-pay | Admitting: Family Medicine

## 2013-07-29 NOTE — Telephone Encounter (Signed)
Yes, this could be bacterial vaginosis, not yeast---and she has had this before  Will assess at the visit

## 2013-07-29 NOTE — Telephone Encounter (Signed)
Patient Information:  Caller Name: Eleyna  Phone: [phone]  Patient: Brittney [name], Brittney [name]  Gender: Female  DOB: [birth year]  Age: 32 Years  PCP: [facility])  Pregnant: No  Office Follow Up:  Does the office need to follow up with this patient?: No  Instructions For The Office: N/A  RN Note:  Mirena/IUD. Agreed to be seen for evaluation. No apointments for 07/29/13.  Scheduled for first open apptointment for 07/30/13 at 0800 with Dr Alphonsus Sias.  Symptoms  Reason For Call & Symptoms: Called for  Diflucan for "yeast infection.  Has white/foamy vaginal discharge.  Some odor present  Reviewed Health History In EMR: Yes  Reviewed Medications In EMR: Yes  Reviewed Allergies In EMR: Yes  Reviewed Surgeries / Procedures: Yes  Date of Onset of Symptoms: 07/29/2013 OB / GYN:  LMP: 07/15/2013  Guideline(s) Used:  Vaginal Discharge  Disposition Per Guideline:   See Within 3 Days in Office  Reason For Disposition Reached:   Bad smelling vaginal discharge  Advice Given:  Genital Hygiene:   Keep your genital area clean. Wash daily.  Keep your genital area dry. Wear cotton underwear or underwear with a cotton crotch.  Do not douche.  Do not use feminine hygiene products.  Call Back If:  You become worse.  Patient Will Follow Care Advice:  YES  Appointment Scheduled:  07/30/2013 08:00:00 Appointment Scheduled Provider:  Tillman Abide [facility])

## 2013-07-30 ENCOUNTER — Encounter: Payer: Self-pay | Admitting: Internal Medicine

## 2013-07-30 ENCOUNTER — Ambulatory Visit (INDEPENDENT_AMBULATORY_CARE_PROVIDER_SITE_OTHER): Payer: BC Managed Care – PPO | Admitting: Internal Medicine

## 2013-07-30 VITALS — BP 120/78 | HR 68 | Temp 98.5°F | Wt 280.0 lb

## 2013-07-30 DIAGNOSIS — N760 Acute vaginitis: Secondary | ICD-10-CM

## 2013-07-30 DIAGNOSIS — B9689 Other specified bacterial agents as the cause of diseases classified elsewhere: Secondary | ICD-10-CM

## 2013-07-30 DIAGNOSIS — A499 Bacterial infection, unspecified: Secondary | ICD-10-CM

## 2013-07-30 MED ORDER — METRONIDAZOLE 0.75 % VA GEL: 1.0000 | Freq: Two times a day (BID) | VAGINAL | Status: DC

## 2013-07-30 NOTE — Assessment & Plan Note (Signed)
Strong positive whiff test Lots of clue cells KOH is unclear  Will treat with vaginal metronidazole---convert to PO if persists

## 2013-07-30 NOTE — Progress Notes (Signed)
Subjective:    Patient ID: Brittney [name], female    DOB: Nov 18, 1980, 32 y.o.   MRN: [id number]  HPI Started with vaginal discharge yesterday morning Itchy and creamy vaginal discharge Fishy smell Similar to past vaginosis Has also had yeast  Monogamous with husband Has mirena  Current Outpatient Prescriptions on File Prior to Visit  Medication Sig Dispense Refill  . aspirin 325 MG tablet Take 1 tablet (325 mg total) by mouth daily.      Marland Kitchen Hyoscyamine Sulfate 0.375 MG TBCR Take one tab twice a day as needed for abdominal pain and cramps  25 tablet  1  . levonorgestrel (MIRENA) 20 MCG/24HR IUD 1 each by Intrauterine route once.      . ranitidine (ZANTAC) 150 MG capsule Take 150 mg by mouth as needed.       . SUMAtriptan (IMITREX) 100 MG tablet Take 1 tablet (100 mg total) by mouth every 2 (two) hours as needed for migraine.  10 tablet  3   No current facility-administered medications on file prior to visit.    Allergies  Allergen Reactions  . Lansoprazole     REACTION: rash    Past Medical History  Diagnosis Date  . Irritable bowel syndrome   . Obesity, unspecified   . Fibroids   . Migraine   . Anemia     Past Surgical History  Procedure Laterality Date  . Colonoscopy  11/2001    polyp; negative pathology  . Colonoscopy  09/2004    Negative  . Uterine US  09/2005    Large endometrial stripe; small ovarian cyst  . Laparoscopic decortication / dubulking / ablation renal cysts      saline histogram  . Tonsillectomy  07/30/07  . Esophagogastroduodenoscopy  1/10    normal  . Colonoscopy w/ polypectomy    . Cholecystectomy  05/06/11  . Flexible sigmoidoscopy  03/05/2012    Procedure: FLEXIBLE SIGMOIDOSCOPY;  Surgeon: Louis Meckel, MD;  Location: [facility];  Service: Endoscopy;  Laterality: N/A;    Family History  Problem Relation Age of Onset  . Ulcers Brother   . Clotting disorder Brother   . Colon cancer Maternal Grandmother   . Ovarian cancer  Maternal Grandmother   . Diabetes Paternal Grandmother   . Hypertension Paternal Grandmother   . Diabetes Paternal Aunt   . Heart disease Father   . Hypertension Father   . Heart attack Father   . Breast cancer Maternal Aunt     Age 52's  . Diabetes Paternal Aunt   . Diabetes Paternal Aunt   . Diabetes Paternal Aunt     History   Social History  . Marital Status: Married    Spouse Name: N/A    Number of Children: 2  . Years of Education: N/A   Occupational History  . Teacher    Social History Main Topics  . Smoking status: Never Smoker   . Smokeless tobacco: Never Used  . Alcohol Use: No     Comment: rare  . Drug Use: No  . Sexual Activity: Yes    Birth Control/ Protection: Condom, IUD   Other Topics Concern  . 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 Use-soda         Review of Systems Periods are frequent but not so heavy No recent antibiotics---last 6 weeks ago  Objective:   Physical Exam  Genitourinary:  Fishy smell Normal introitus No cervical motion tenderness          Assessment & Plan:

## 2013-07-30 NOTE — Progress Notes (Signed)
Pre-visit discussion using our clinic review tool. No additional management support is needed unless otherwise documented below in the visit note.  

## 2013-09-02 ENCOUNTER — Other Ambulatory Visit: Payer: Self-pay

## 2013-09-02 ENCOUNTER — Other Ambulatory Visit: Payer: Self-pay | Admitting: Gynecology

## 2013-09-02 ENCOUNTER — Other Ambulatory Visit (HOSPITAL_COMMUNITY)
Admission: RE | Admit: 2013-09-02 | Discharge: 2013-09-02 | Disposition: A | Discharge status: Home / Self Care | Payer: BC Managed Care – PPO | Source: Ambulatory Visit | Attending: Gynecology | Admitting: Gynecology

## 2013-09-02 ENCOUNTER — Encounter: Payer: Self-pay | Admitting: Gynecology

## 2013-09-02 ENCOUNTER — Ambulatory Visit (INDEPENDENT_AMBULATORY_CARE_PROVIDER_SITE_OTHER): Payer: BC Managed Care – PPO | Admitting: Gynecology

## 2013-09-02 VITALS — BP 136/86 | Ht 67.0 in | Wt 287.0 lb

## 2013-09-02 DIAGNOSIS — N898 Other specified noninflammatory disorders of vagina: Secondary | ICD-10-CM

## 2013-09-02 DIAGNOSIS — R635 Abnormal weight gain: Secondary | ICD-10-CM

## 2013-09-02 DIAGNOSIS — Z833 Family history of diabetes mellitus: Secondary | ICD-10-CM

## 2013-09-02 DIAGNOSIS — Z01419 Encounter for gynecological examination (general) (routine) without abnormal findings: Secondary | ICD-10-CM | POA: Insufficient documentation

## 2013-09-02 DIAGNOSIS — Z23 Encounter for immunization: Secondary | ICD-10-CM

## 2013-09-02 DIAGNOSIS — Z113 Encounter for screening for infections with a predominantly sexual mode of transmission: Secondary | ICD-10-CM

## 2013-09-02 DIAGNOSIS — Z8249 Family history of ischemic heart disease and other diseases of the circulatory system: Secondary | ICD-10-CM | POA: Insufficient documentation

## 2013-09-02 DIAGNOSIS — Z1151 Encounter for screening for human papillomavirus (HPV): Secondary | ICD-10-CM | POA: Insufficient documentation

## 2013-09-02 LAB — CBC WITH DIFFERENTIAL/PLATELET
Basophils Absolute: 0 10*3/uL (ref 0.0–0.1)
Basophils Relative: 0 % (ref 0–1)
EOS ABS: 0.1 10*3/uL (ref 0.0–0.7)
Eosinophils Relative: 2 % (ref 0–5)
HCT: 43.9 % (ref 36.0–46.0)
Hemoglobin: 14.1 g/dL (ref 12.0–15.0)
LYMPHS PCT: 34 % (ref 12–46)
Lymphs Abs: 2.4 10*3/uL (ref 0.7–4.0)
MCH: 27 pg (ref 26.0–34.0)
MCHC: 32.1 g/dL (ref 30.0–36.0)
MCV: 83.9 fL (ref 78.0–100.0)
Monocytes Absolute: 0.4 10*3/uL (ref 0.1–1.0)
Monocytes Relative: 6 % (ref 3–12)
NEUTROS PCT: 58 % (ref 43–77)
Neutro Abs: 4.1 10*3/uL (ref 1.7–7.7)
PLATELETS: 285 10*3/uL (ref 150–400)
RBC: 5.23 MIL/uL — AB (ref 3.87–5.11)
RDW: 15.8 % — ABNORMAL HIGH (ref 11.5–15.5)
WBC: 7 10*3/uL (ref 4.0–10.5)

## 2013-09-02 LAB — COMPREHENSIVE METABOLIC PANEL
ALT: 14 U/L (ref 0–35)
AST: 16 U/L (ref 0–37)
Albumin: 4.1 g/dL (ref 3.5–5.2)
Alkaline Phosphatase: 77 U/L (ref 39–117)
BILIRUBIN TOTAL: 0.4 mg/dL (ref 0.3–1.2)
BUN: 10 mg/dL (ref 6–23)
CHLORIDE: 103 meq/L (ref 96–112)
CO2: 22 mEq/L (ref 19–32)
CREATININE: 0.82 mg/dL (ref 0.50–1.10)
Calcium: 9.4 mg/dL (ref 8.4–10.5)
Glucose, Bld: 72 mg/dL (ref 70–99)
Potassium: 4.3 mEq/L (ref 3.5–5.3)
Sodium: 136 mEq/L (ref 135–145)
TOTAL PROTEIN: 7 g/dL (ref 6.0–8.3)

## 2013-09-02 LAB — WET PREP FOR TRICH, YEAST, CLUE: Trich, Wet Prep: NONE SEEN

## 2013-09-02 LAB — LIPID PANEL
Cholesterol: 155 mg/dL (ref 0–200)
HDL: 41 mg/dL (ref >39)
LDL Cholesterol: 103 mg/dL — ABNORMAL HIGH (ref 0–99)
Total CHOL/HDL Ratio: 3.8 Ratio
Triglycerides: 57 mg/dL (ref <150)
VLDL: 11 mg/dL (ref 0–40)

## 2013-09-02 LAB — TSH: TSH: 1.27 u[IU]/mL (ref 0.350–4.500)

## 2013-09-02 MED ORDER — ETONOGESTREL-ETHINYL ESTRADIOL 0.12-0.015 MG/24HR VA RING: 1.0000 | VAGINAL_RING | VAGINAL | Status: DC

## 2013-09-02 MED ORDER — ETONOGESTREL-ETHINYL ESTRADIOL 0.12-0.015 MG/24HR VA RING: VAGINAL_RING | VAGINAL | Status: DC

## 2013-09-02 NOTE — Progress Notes (Signed)
LUNDYN COSTE June 26, 1981 [id number]   History:    33 y.o.  for annual gyn exam requesting removal of her Mirena IUD that was placed 2 years ago. Patient prior to that had another Marine IUD for 5 years and had no problems. She is complaining of heavier bleeding and recurrent vaginal infections. Patient states she has a steady partner. She was having a fishy like odor.  Review of patient's records indicated she had benign colon polyp removed in 2013. She is interested in the flu vaccine today. Patient with very strong family history of diabetes. Patient is overweight.  Past medical history,surgical history, family history and social history were all reviewed and documented in the EPIC chart.  Gynecologic History No LMP recorded. Patient is not currently having periods (Reason: IUD). Contraception: IUD Last Pap: 2007. Results were: normal Last mammogram: Not indicated. Results were: normal  Obstetric History OB History  Gravida Para Term Preterm AB SAB TAB Ectopic Multiple Living  2 2 2  0 0 0 0 0 0 2    # Outcome Date GA Lbr Len/2nd Weight Sex Delivery Anes PTL Lv  2 TRM 04/04/11 [redacted]w[redacted]d 11:37 / 00:09 6 lb 2.9 oz (2.805 kg) F SVD EPI  Y     Comments: none noted  1 TRM                ROS: A ROS was performed and pertinent positives and negatives are included in the history.  GENERAL: No fevers or chills. HEENT: No change in vision, no earache, sore throat or sinus congestion. NECK: No pain or stiffness. CARDIOVASCULAR: No chest pain or pressure. No palpitations. PULMONARY: No shortness of breath, cough or wheeze. GASTROINTESTINAL: No abdominal pain, nausea, vomiting or diarrhea, melena or bright red blood per rectum. GENITOURINARY: No urinary frequency, urgency, hesitancy or dysuria. MUSCULOSKELETAL: No joint or muscle pain, no back pain, no recent trauma. DERMATOLOGIC: No rash, no itching, no lesions. ENDOCRINE: No polyuria, polydipsia, no heat or cold intolerance. No recent change  in weight. HEMATOLOGICAL: No anemia or easy bruising or bleeding. NEUROLOGIC: No headache, seizures, numbness, tingling or weakness. PSYCHIATRIC: No depression, no loss of interest in normal activity or change in sleep pattern.     Exam: chaperone present  BP 136/86  Ht 5\' 7"  (1.702 m)  Wt 287 lb (130.182 kg)  BMI 44.94 kg/m2  Body mass index is 44.94 kg/(m^2).  General appearance : Well developed well nourished female. No acute distress HEENT: Neck supple, trachea midline, no carotid bruits, no thyroidmegaly Lungs: Clear to auscultation, no rhonchi or wheezes, or rib retractions  Heart: Regular rate and rhythm, no murmurs or gallops Breast:Examined in sitting and supine position were symmetrical in appearance, no palpable masses or tenderness,  no skin retraction, no nipple inversion, no nipple discharge, no skin discoloration, no axillary or supraclavicular lymphadenopathy Abdomen: no palpable masses or tenderness, no rebound or guarding Extremities: no edema or skin discoloration or tenderness  Pelvic:  Bartholin, Urethra, Skene Glands: Within normal limits             Vagina: No gross lesions or discharge  Cervix: No gross lesions or discharge IUD string seen  Uterus  anteverted, normal size, shape and consistency, non-tender and mobile  Adnexa  Without masses or tenderness  Anus and perineum  normal   Rectovaginal  normal sphincter tone without palpated masses or tenderness             Hemoccult not indicated   After  GC and chlamydia culture and Pap smear obtained the cervix was cleansed with Betadine solution and a [place] clamp was introduced into the endocervical canal to grab the IUD string which was retrieved shown to the patient and discarded.  Wet prep: Pos Amine, many clue cells a few WBCs and many bacteria Assessment/Plan:  33 y.o. female for annual exam is interested in returning back to the [place] for contraception. She is going to try to use it continuously and  withdrawal every 3 months. GC and chlamydia culture pending at time of this dictation. The following labs were ordered: Fasting lipid profile, comprehensive metabolic panel, TSH, urinalysis as well as Leiden 5 Factor. Patient had informed me that her brother in his 50s developed a PE and was on no medication, was not obese and was not a result of any trauma. Patient is currently on her third day of her menstrual cycle. I told her to wait to the end of the week until we get the result back from her blood test and if she does not carried mutation she can use the Nuvaring.  Although she has used hormonal contraception as in the past and had no problem with her 2 previous pregnancies.  Note: This dictation was prepared with  Dragon/digital dictation along withSmart phrase technology. Any transcriptional errors that result from this process are unintentional.   Terrance Mass MD, 1:48 PM 09/02/2013

## 2013-09-02 NOTE — Patient Instructions (Signed)

## 2013-09-03 LAB — GC/CHLAMYDIA PROBE AMP
CT Probe RNA: NEGATIVE
GC PROBE AMP APTIMA: NEGATIVE

## 2013-09-03 LAB — URINALYSIS W MICROSCOPIC + REFLEX CULTURE
Bacteria, UA: NONE SEEN
Bilirubin Urine: NEGATIVE
Casts: NONE SEEN
Crystals: NONE SEEN
Glucose, UA: NEGATIVE mg/dL
Ketones, ur: NEGATIVE mg/dL
LEUKOCYTES UA: NEGATIVE
Nitrite: NEGATIVE
PROTEIN: NEGATIVE mg/dL
Specific Gravity, Urine: 1.015 (ref 1.005–1.030)
Urobilinogen, UA: 1 mg/dL (ref 0.0–1.0)
pH: 7 (ref 5.0–8.0)

## 2013-09-03 LAB — FACTOR 5 LEIDEN

## 2013-09-04 ENCOUNTER — Other Ambulatory Visit: Payer: Self-pay | Admitting: Gynecology

## 2013-09-04 DIAGNOSIS — R3129 Other microscopic hematuria: Secondary | ICD-10-CM

## 2013-09-04 LAB — URINE CULTURE: Colony Count: 60000

## 2013-09-04 MED ORDER — FLUCONAZOLE 150 MG PO TABS: 150.0000 mg | ORAL_TABLET | Freq: Once | ORAL | Status: DC

## 2013-09-09 ENCOUNTER — Telehealth: Payer: Self-pay | Admitting: Gastroenterology

## 2013-09-09 NOTE — Telephone Encounter (Signed)
Pt states that for the last 2 weeks she has had diarrhea every time she eats. Requesting to be seen. Pt scheduled to see Amy Esterwood PA 09/12/13@8 :30am. Pt aware of appt.

## 2013-09-10 ENCOUNTER — Encounter: Payer: Self-pay | Admitting: Gastroenterology

## 2013-09-12 ENCOUNTER — Ambulatory Visit (INDEPENDENT_AMBULATORY_CARE_PROVIDER_SITE_OTHER): Payer: BC Managed Care – PPO | Admitting: Physician Assistant

## 2013-09-12 ENCOUNTER — Encounter: Payer: Self-pay | Admitting: Physician Assistant

## 2013-09-12 VITALS — BP 96/76 | HR 72 | Ht 68.0 in | Wt 282.0 lb

## 2013-09-12 DIAGNOSIS — R197 Diarrhea, unspecified: Secondary | ICD-10-CM

## 2013-09-12 DIAGNOSIS — R109 Unspecified abdominal pain: Secondary | ICD-10-CM

## 2013-09-12 MED ORDER — GLYCOPYRROLATE 2 MG PO TABS: 2.0000 mg | ORAL_TABLET | Freq: Two times a day (BID) | ORAL | Status: DC

## 2013-09-12 NOTE — Addendum Note (Signed)
Addended by: Ellamae Sia on: 09/12/2013 02:11 PM   Modules accepted: Orders

## 2013-09-12 NOTE — Progress Notes (Signed)
Reviewed and agree with management. Robert D. Kaplan, M.D., FACG  

## 2013-09-12 NOTE — Patient Instructions (Signed)
Please go to the basement level to have your labs drawn.  We sent a prescription to [facility] for the Robinul Forte ( Glycopyrrolate) .   We have given you samples of FLorastor. Take 1 cap twice daily for 14 days.

## 2013-09-12 NOTE — Progress Notes (Signed)
Subjective:    Patient ID: Brittney [name], female    DOB: Sep 19, 1980, 33 y.o.   MRN: [id number]  HPI   Brittney [name] and is a pleasant 33 year old AA female known to Dr. Deatra Ina. She has history of migraine headaches, obesity and is status post cholecystectomy. She had undergone colonoscopy for complaints of diarrhea and rectal bleeding in February 2013 and was found to have a 2 mm tubular adenoma which was removed. Biopsies were taken from the rectum for question of proctitis and these were negative. Patient also had prior colonoscopy in 2012 with adenomatous polyps. She comes it comes in today with complaints of persistent diarrhea over the past month. She says this is associated with urgency and cramping. She is not having any normal bowel movements and generally has diarrhea fairly immediately after eating anything so she is only been eating once per day. She has not noticed any melena or hematochezia. No fever or chills. No significant fatigue etc. no nausea or vomiting. She does not been on any new supplements etc. but did take a course of Flagyl vaginally for a vaginosis in December and then took a Z-Pak for an upper respiratory infection. She says the diarrhea had started before the Z-Pak.    Review of Systems  Constitutional: Negative.   HENT: Negative.   Eyes: Negative.   Respiratory: Negative.   Cardiovascular: Negative.   Gastrointestinal: Positive for abdominal pain and diarrhea.  Endocrine: Negative.   Genitourinary: Negative.   Musculoskeletal: Negative.   Allergic/Immunologic: Negative.   Neurological: Negative.   Hematological: Negative.   Psychiatric/Behavioral: Negative.    Outpatient Prescriptions Prior to Visit  Medication Sig Dispense Refill  . aspirin 325 MG tablet Take 1 tablet (325 mg total) by mouth daily.      Marland Kitchen Hyoscyamine Sulfate 0.375 MG TBCR Take one tab twice a day as needed for abdominal pain and cramps  25 tablet  1  . ranitidine (ZANTAC) 150 MG capsule Take  150 mg by mouth as needed.       . SUMAtriptan (IMITREX) 100 MG tablet Take 1 tablet (100 mg total) by mouth every 2 (two) hours as needed for migraine.  10 tablet  3  . etonogestrel-ethinyl estradiol (NUVARING) 0.12-0.015 MG/24HR vaginal ring Place 1 each vaginally every 28 (twenty-eight) days. Insert vaginally and leave in place for 3 consecutive weeks, then remove for 1 week.  3 each  4  . fluconazole (DIFLUCAN) 150 MG tablet Take 1 tablet (150 mg total) by mouth once.  1 tablet  0  . levonorgestrel (MIRENA) 20 MCG/24HR IUD 1 each by Intrauterine route once.      . metroNIDAZOLE (METROGEL) 0.75 % vaginal gel Place 1 Applicatorful vaginally 2 (two) times daily.  70 g  1   No facility-administered medications prior to visit.   Allergies  Allergen Reactions  . Lansoprazole     REACTION: rash   Patient Active Problem List   Diagnosis Date Noted  . Family history of pulmonary embolism 09/02/2013  . Family history of diabetes mellitus 09/02/2013  . Bacterial vaginosis 07/30/2013  . Chest pain 07/09/2013  . AOM (acute otitis media) 06/10/2013  . Cutaneous skin tags 05/15/2013  . Sebaceous cyst of left axilla 05/15/2013  . DUB (dysfunctional uterine bleeding) 09/24/2012  . Endometritis 09/24/2012  . Viral URI 09/17/2012  . Abrasion of left ear 09/17/2012  . 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 EDEMA 11/04/2009  . WEIGHT GAIN 07/23/2008  . OBESITY 03/31/2008  . HEARING  LOSS, MILD 03/31/2008  . MIGRAINE HEADACHE 02/20/2007  . IBS 02/20/2007  . OSTEOARTHRITIS 02/20/2007   History  Substance Use Topics  . Smoking status: Never Smoker   . Smokeless tobacco: Never Used  . Alcohol Use: No     Comment: rare   family history includes Breast cancer in her maternal aunt; Clotting disorder in her brother; Colon cancer in her maternal grandmother; Diabetes in her paternal aunt, paternal aunt, and paternal grandmother; Heart attack in her father; Heart disease in her father; Hypertension  in her father and paternal grandmother; Lung cancer in her maternal grandfather; Ovarian cancer in her maternal grandmother; Ulcers in her brother.     Objective:   Physical Exam and and and well-developed female in no acute distress, pleasant blood pressure 96/76 pulse 72 height 5 foot 8 weight 282 BMI of 42. HEENT; nontraumatic normocephalic EOMI PERRLA sclera anicteric, Neck; supple no JVD, Cardiovascular ;regular rate and rhythm with S1-S2 no murmur or gallop, Pulm;clear bilaterally, Abdomen; large soft mildly tender in the left lower quadrant and suprapubic area no guarding or rebound no palpable mass or hepatosplenomegaly bowel sounds are present, Rectal ;exam not done, Extremities; no clubbing cyanosis or edema skin warm and dry, Psych ;mood and affect appropriate        Assessment & Plan:  #86  33 year old female with persistent diarrhea x4 weeks associated with urgency and abdominal cramping. This may be an exacerbation of IBS- will rule out infectious etiology. #2 history of adenomatous colon polyps last colonoscopy 2013 due for followup at 5 year interval #3 status post cholecystectomy #4 obesity  Plan we'll check stool pathogen panel Start Robinul Forte 2 mg by mouth twice daily until symptoms resolved add a probiotic in the form o fFlorastor twice daily over the next 2 weeks Further plans pending results of above

## 2013-09-13 LAB — GASTROINTESTINAL PATHOGEN PANEL PCR
C. difficile Tox A/B, PCR: NEGATIVE
Campylobacter, PCR: NEGATIVE
Cryptosporidium, PCR: NEGATIVE
E coli (ETEC) LT/ST PCR: NEGATIVE
E coli (STEC) stx1/stx2, PCR: NEGATIVE
E coli 0157, PCR: NEGATIVE
Giardia lamblia, PCR: NEGATIVE
NOROVIRUS, PCR: NEGATIVE
ROTAVIRUS, PCR: NEGATIVE
SALMONELLA, PCR: NEGATIVE
Shigella, PCR: NEGATIVE

## 2013-10-15 ENCOUNTER — Encounter: Payer: Self-pay | Admitting: Cardiovascular Disease

## 2013-10-15 ENCOUNTER — Ambulatory Visit (INDEPENDENT_AMBULATORY_CARE_PROVIDER_SITE_OTHER): Payer: BC Managed Care – PPO | Admitting: Cardiovascular Disease

## 2013-10-15 VITALS — BP 118/77 | HR 60 | Ht 68.0 in | Wt 286.2 lb

## 2013-10-15 DIAGNOSIS — R079 Chest pain, unspecified: Secondary | ICD-10-CM

## 2013-10-15 DIAGNOSIS — R9431 Abnormal electrocardiogram [ECG] [EKG]: Secondary | ICD-10-CM

## 2013-10-15 NOTE — Assessment & Plan Note (Signed)
The etiology of this is not entirely clear but presumed to be due to coronary spasm given the EKG changes. Echocardiogram and nuclear stress test both were unremarkable. She reports no further episodes of chest pain. Thus, no treatment is indicated at this point. I advised her to avoid decongestants and migraine medications that can cause spasm. She is to followup as needed.

## 2013-10-15 NOTE — Patient Instructions (Signed)
Follow up as needed

## 2013-10-15 NOTE — Progress Notes (Signed)
HPI  This is a pleasant 33 year old African American female who is here today for a followup visit regarding  chest pain and suspected coronary spasm. She has no previous cardiac history. She does have family history of premature coronary artery disease but no other risk factors for coronary artery disease. She was seen 2 months ago for substernal chest tightness. Workup at [facility] emergency room was negative. Upon followup in the clinic, she was noted to have an abnormal EKG with  1 mm of ST elevation in 1 and aVL with minor ST depression in the inferior leads. We did an echocardiogram which showed normal LV systolic function and wall motion. She was asymptomatic at that time. She underwent a treadmill nuclear stress test  which showed no evidence of ischemia with normal ejection fraction. EKG was back to normal.  She reports no further episodes of chest pain or significant dyspnea since that time.  Allergies  Allergen Reactions  . Lansoprazole     REACTION: rash     Current Outpatient Prescriptions on File Prior to Visit  Medication Sig Dispense Refill  . aspirin 325 MG tablet Take 1 tablet (325 mg total) by mouth daily.      Marland Kitchen glycopyrrolate (ROBINUL) 2 MG tablet Take 1 tablet (2 mg total) by mouth 2 (two) times daily.  60 tablet  1  . Hyoscyamine Sulfate 0.375 MG TBCR Take one tab twice a day as needed for abdominal pain and cramps  25 tablet  1  . ranitidine (ZANTAC) 150 MG capsule Take 150 mg by mouth as needed.       . SUMAtriptan (IMITREX) 100 MG tablet Take 1 tablet (100 mg total) by mouth every 2 (two) hours as needed for migraine.  10 tablet  3   No current facility-administered medications on file prior to visit.     Past Medical History  Diagnosis Date  . Irritable bowel syndrome   . Obesity, unspecified   . Fibroids   . Migraine   . Anemia      Past Surgical History  Procedure Laterality Date  . Colonoscopy  11/2001    polyp; negative pathology  . Colonoscopy   09/2004    Negative  . Uterine US  09/2005    Large endometrial stripe; small ovarian cyst  . Laparoscopic decortication / dubulking / ablation renal cysts      saline histogram  . Tonsillectomy  07/30/07  . Esophagogastroduodenoscopy  1/10    normal  . Colonoscopy w/ polypectomy    . Cholecystectomy  05/06/11  . Flexible sigmoidoscopy  03/05/2012    Procedure: FLEXIBLE SIGMOIDOSCOPY;  Surgeon: Inda Castle, MD;  Location: [facility];  Service: Endoscopy;  Laterality: N/A;     Family History  Problem Relation Age of Onset  . Ulcers Brother   . Clotting disorder Brother   . Colon cancer Maternal Grandmother   . Ovarian cancer Maternal Grandmother   . Diabetes Paternal Grandmother   . Hypertension Paternal Grandmother   . Diabetes Paternal Aunt   . Heart disease Father   . Hypertension Father   . Heart attack Father   . Breast cancer Maternal Aunt     Age 92's  . Diabetes Paternal Aunt     x 3  . Lung cancer Maternal Grandfather     SMOKER     History   Social History  . Marital Status: Married    Spouse Name: N/A    Number of Children: 2  .  Years of Education: N/A   Occupational History  . Teacher    Social History Main Topics  . Smoking status: Never Smoker   . Smokeless tobacco: Never Used  . Alcohol Use: No     Comment: rare  . Drug Use: No  . Sexual Activity: Yes    Birth Control/ Protection: Condom, IUD   Other Topics Concern  . 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 Use-soda           ROS A 10 point review of system was performed. It is negative other than that mentioned in the history of present illness.   PHYSICAL EXAM   BP 118/77  Pulse 60  Ht 5\' 8"  (1.727 m)  Wt 286 lb 4 oz (129.842 kg)  BMI 43.53 kg/m2 Constitutional: She is oriented to person, place, and time. She appears well-developed and well-nourished. No distress.  HENT: No nasal discharge.  Head:  Normocephalic and atraumatic.  Eyes: Pupils are equal and round. No discharge.  Neck: Normal range of motion. Neck supple. No JVD present. No thyromegaly present.  Cardiovascular: Normal rate, regular rhythm, normal heart sounds. Exam reveals no gallop and no friction rub. No murmur heard.  Pulmonary/Chest: Effort normal and breath sounds normal. No stridor. No respiratory distress. She has no wheezes. She has no rales. She exhibits no tenderness.  Abdominal: Soft. Bowel sounds are normal. She exhibits no distension. There is no tenderness. There is no rebound and no guarding.  Musculoskeletal: Normal range of motion. She exhibits no edema and no tenderness.  Neurological: She is alert and oriented to person, place, and time. Coordination normal.  Skin: Skin is warm and dry. No rash noted. She is not diaphoretic. No erythema. No pallor.  Psychiatric: She has a normal mood and affect. Her behavior is normal. Judgment and thought content normal.     KPT:WSFKC  Rhythm  Low voltage in precordial leads.  Borderline   ASSESSMENT AND PLAN

## 2013-10-18 ENCOUNTER — Ambulatory Visit (INDEPENDENT_AMBULATORY_CARE_PROVIDER_SITE_OTHER): Payer: BC Managed Care – PPO | Admitting: Gastroenterology

## 2013-10-18 ENCOUNTER — Encounter: Payer: Self-pay | Admitting: Gastroenterology

## 2013-10-18 VITALS — BP 122/80 | HR 72 | Ht 68.0 in | Wt 285.6 lb

## 2013-10-18 DIAGNOSIS — D126 Benign neoplasm of colon, unspecified: Secondary | ICD-10-CM

## 2013-10-18 DIAGNOSIS — K589 Irritable bowel syndrome without diarrhea: Secondary | ICD-10-CM

## 2013-10-18 NOTE — Assessment & Plan Note (Signed)
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  She more often has diarrhea than constipation but may swing either way.  Recommendations #1 fiber supplementation daily #2 patient was instructed to take MiraLax if she does not have a bowel movement after 2-3 days

## 2013-10-18 NOTE — Progress Notes (Signed)
          History of Present Illness:  The patient has returned now complaining of constipation.  If she doesn't use a suppository she may go up to a week without a bowel movement.  She has experienced this in the past although she more often has diarrhea.  Stool pathogen panel was negative.    Review of Systems: Pertinent positive and negative review of systems were noted in the above HPI section. All other review of systems were otherwise negative.    Current Medications, Allergies, Past Medical History, Past Surgical History, Family History and Social History were reviewed in [place] record  Vital signs were reviewed in today's medical record. Physical Exam: General: Well developed , well nourished, no acute distress   See Assessment and Plan under Problem List

## 2013-10-18 NOTE — Patient Instructions (Signed)
Fiber Supplement twice a day If fiber does not work switch to [employer] daily Follow up in 6 weeks

## 2013-10-18 NOTE — Assessment & Plan Note (Signed)
Plan followup colonoscopy 2018 

## 2013-11-12 ENCOUNTER — Ambulatory Visit (INDEPENDENT_AMBULATORY_CARE_PROVIDER_SITE_OTHER): Payer: BC Managed Care – PPO | Admitting: Family Medicine

## 2013-11-12 ENCOUNTER — Encounter: Payer: Self-pay | Admitting: Family Medicine

## 2013-11-12 VITALS — BP 128/70 | HR 65 | Temp 97.7°F | Ht 68.0 in | Wt 284.0 lb

## 2013-11-12 DIAGNOSIS — L918 Other hypertrophic disorders of the skin: Secondary | ICD-10-CM

## 2013-11-12 DIAGNOSIS — N760 Acute vaginitis: Secondary | ICD-10-CM

## 2013-11-12 DIAGNOSIS — L909 Atrophic disorder of skin, unspecified: Secondary | ICD-10-CM

## 2013-11-12 DIAGNOSIS — L919 Hypertrophic disorder of the skin, unspecified: Secondary | ICD-10-CM

## 2013-11-12 MED ORDER — TERCONAZOLE 0.8 % VA CREA: 1.0000 | TOPICAL_CREAM | Freq: Every day | VAGINAL | Status: DC

## 2013-11-12 MED ORDER — FLUCONAZOLE 150 MG PO TABS: 150.0000 mg | ORAL_TABLET | Freq: Once | ORAL | Status: DC

## 2013-11-12 MED ORDER — METRONIDAZOLE 500 MG PO TABS: 500.0000 mg | ORAL_TABLET | Freq: Two times a day (BID) | ORAL | Status: DC

## 2013-11-12 NOTE — Progress Notes (Signed)
Pre visit review using our clinic review tool, if applicable. No additional management support is needed unless otherwise documented below in the visit note. 

## 2013-11-12 NOTE — Progress Notes (Signed)
Subjective:    Patient ID: Brittney [name], female    DOB: [birth year], 33 y.o.   MRN: [id number]  HPI Here with vaginal symptoms   Had BV not that long ago  Used metro cream She still has discharge - but no odor  Has some itching   No STD exposure   No abd pain or cramping   No longer has mirina   (she thinks she gained wt with it)  LMP was march 9th   Also skin tag remains under R arm - the others fell off after cryotx  It is still bothersome  Patient Active Problem List   Diagnosis Date Noted  . Vaginitis 11/12/2013  . Family history of pulmonary embolism 09/02/2013  . Family history of diabetes mellitus 09/02/2013  . Bacterial vaginosis 07/30/2013  . Chest pain 07/09/2013  . AOM (acute otitis media) 06/10/2013  . Cutaneous skin tags 05/15/2013  . Sebaceous cyst of left axilla 05/15/2013  . DUB (dysfunctional uterine bleeding) 09/24/2012  . Endometritis 09/24/2012  . Abrasion of left ear 09/17/2012  . 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 EDEMA 11/04/2009  . WEIGHT GAIN 07/23/2008  . OBESITY 03/31/2008  . HEARING LOSS, MILD 03/31/2008  . MIGRAINE HEADACHE 02/20/2007  . IBS 02/20/2007  . OSTEOARTHRITIS 02/20/2007   Past Medical History  Diagnosis Date  . Irritable bowel syndrome   . Obesity, unspecified   . Fibroids   . Migraine   . Anemia    Past Surgical History  Procedure Laterality Date  . Colonoscopy  11/2001    polyp; negative pathology  . Colonoscopy  09/2004    Negative  . Uterine US  09/2005    Large endometrial stripe; small ovarian cyst  . Laparoscopic decortication / dubulking / ablation renal cysts      saline histogram  . Tonsillectomy  07/30/07  . Esophagogastroduodenoscopy  1/10    normal  . Colonoscopy w/ polypectomy    . Cholecystectomy  05/06/11  . Flexible sigmoidoscopy  03/05/2012    Procedure: FLEXIBLE SIGMOIDOSCOPY;  Surgeon: Inda Castle, MD;  Location: [facility];  Service: Endoscopy;  Laterality: N/A;   History    Substance Use Topics  . Smoking status: Never Smoker   . Smokeless tobacco: Never Used  . Alcohol Use: No     Comment: rare   Family History  Problem Relation Age of Onset  . Ulcers Brother   . Clotting disorder Brother   . Colon cancer Maternal Grandmother   . Ovarian cancer Maternal Grandmother   . Diabetes Paternal Grandmother   . Hypertension Paternal Grandmother   . Diabetes Paternal Aunt   . Heart disease Father   . Hypertension Father   . Heart attack Father   . Breast cancer Maternal Aunt     Age 50's  . Diabetes Paternal Aunt     x 3  . Lung cancer Maternal Grandfather     SMOKER   Allergies  Allergen Reactions  . Lansoprazole     REACTION: rash   Current Outpatient Prescriptions on File Prior to Visit  Medication Sig Dispense Refill  . aspirin 325 MG tablet Take 1 tablet (325 mg total) by mouth daily.      Marland Kitchen glycopyrrolate (ROBINUL) 2 MG tablet Take 1 tablet (2 mg total) by mouth 2 (two) times daily.  60 tablet  1  . Hyoscyamine Sulfate 0.375 MG TBCR Take one tab twice a day as needed for abdominal pain and cramps  25  tablet  1  . ranitidine (ZANTAC) 150 MG capsule Take 150 mg by mouth as needed.        No current facility-administered medications on file prior to visit.     Review of Systems Review of Systems  Constitutional: Negative for fever, appetite change, fatigue and unexpected weight change.  Eyes: Negative for pain and visual disturbance.  Respiratory: Negative for cough and shortness of breath.   Cardiovascular: Negative for cp or palpitations    Gastrointestinal: Negative for nausea, diarrhea and constipation.  Genitourinary: Negative for urgency and frequency. pos for vaginal d/c and itching  Skin: Negative for pallor or rash   Neurological: Negative for weakness, light-headedness, numbness and headaches.  Hematological: Negative for adenopathy. Does not bruise/bleed easily.  Psychiatric/Behavioral: Negative for dysphoric mood. The patient  is not nervous/anxious.         Objective:   Physical Exam  Constitutional: She appears well-developed and well-nourished. No distress.  obese and well appearing   HENT:  Head: Normocephalic and atraumatic.  Eyes: Conjunctivae and EOM are normal. Pupils are equal, round, and reactive to light.  Neck: Normal range of motion. Neck supple.  Cardiovascular: Normal rate and regular rhythm.   Abdominal: Soft. Bowel sounds are normal. She exhibits no distension and no mass. There is no tenderness.  No suprapubic tenderness or fullness    Genitourinary:  While thick vaginal d/c Some hyperemia of labia minora  Wet prep obt  Neurological: She is alert.  Skin: Skin is warm and dry.  2-3 mm skin tag remains in R axilla   Psychiatric: She has a normal mood and affect.          Assessment & Plan:

## 2013-11-12 NOTE — Patient Instructions (Signed)
You have yeast infection and still a bit of the bacterial vaginal infection left  First -take the diflucan pill Use the terazol cream at bedtime for 3 days - vaginally   Then after 3 days start the 7 day course of oral flagyl - take it with food  Continue yogurt for probiotic properties   If not improved call and let me know   Keep the skin tag site clean-you can use antibiotic ointment -let me know if it becomes very red or swollen

## 2013-11-13 LAB — POCT WET PREP (WET MOUNT)
KOH WET PREP POC: NEGATIVE
TRICHOMONAS WET PREP HPF POC: NEGATIVE

## 2013-11-13 NOTE — Assessment & Plan Note (Signed)
Skin tag under R arm removed with sterile scissors Cleaned -sterilly anesth with spray  Clipped and dressed with abx oint and band aid Pt tolerated well  Aftercare disc

## 2013-11-13 NOTE — Assessment & Plan Note (Signed)
Both yeast and clue cells on wet prep tx with diflucan and terazol for yeast Then 7 d flagyl for bv-orally Disc side eff in detail Continue probiotics Update if not starting to improve in a week or if worsening

## 2014-01-24 ENCOUNTER — Encounter: Payer: Self-pay | Admitting: Family Medicine

## 2014-01-24 ENCOUNTER — Ambulatory Visit (INDEPENDENT_AMBULATORY_CARE_PROVIDER_SITE_OTHER): Payer: BC Managed Care – PPO | Admitting: Family Medicine

## 2014-01-24 VITALS — BP 108/76 | HR 71 | Temp 98.4°F | Ht 68.0 in | Wt 275.5 lb

## 2014-01-24 DIAGNOSIS — R51 Headache: Secondary | ICD-10-CM

## 2014-01-24 DIAGNOSIS — M5412 Radiculopathy, cervical region: Secondary | ICD-10-CM

## 2014-01-24 MED ORDER — BUTALBITAL-ASPIRIN-CAFFEINE 50-325-40 MG PO CAPS: 1.0000 | ORAL_CAPSULE | Freq: Two times a day (BID) | ORAL | Status: DC | PRN

## 2014-01-24 MED ORDER — PREDNISONE 20 MG PO TABS: ORAL_TABLET | ORAL | Status: DC

## 2014-01-24 NOTE — Progress Notes (Signed)
Pre visit review using our clinic review tool, if applicable. No additional management support is needed unless otherwise documented below in the visit note. 

## 2014-01-24 NOTE — Progress Notes (Signed)
[place] [address] Phone: [phone] Fax: [phone]  Patient ID: Brittney [name] MRN: [id number], DOB: 11-05-1980, 33 y.o. Date of Encounter: 01/24/2014  Primary Physician:  Loura Pardon, MD   Chief Complaint: Headache   Subjective:   History of Present Illness:  Brittney [name] is a 33 y.o. very pleasant female patient who presents with the following:  2 primary problems:  Headache: All in the back, taken some tylenol and also some tingling in arm and hand. That started on wed. She is not having any typical nauseousness or visual changes, like she has with her migraine headaches. This is more focal and the posterior aspect in the lateral, LEFT aspect of her occiput. She has had no trauma or injury.  No neck pain. No shoulder blade pain. No visual changes.   l cervical radicular sx, we're she does not describe any sort of numbness or weakness, but she is having any radicular tingling sensation going down her LEFT arm intermittently. This is again over the course of the last week when she has been having these headaches.  Past Medical History, Surgical History, Social History, Family History, Problem List, Medications, and Allergies have been reviewed and updated if relevant.  Review of Systems:  GEN: No acute illnesses, no fevers, chills. GI: No n/v/d, eating normally Pulm: No SOB Interactive and getting along well at home.  Otherwise, ROS is as per the HPI.  Objective:   Physical Examination: BP 108/76  Pulse 71  Temp(Src) 98.4 F (36.9 C) (Oral)  Ht 5\' 8"  (1.727 m)  Wt 275 lb 8 oz (124.966 kg)  BMI 41.90 kg/m2  LMP 01/18/2014   GEN: WDWN, NAD, Non-toxic, Alert & Oriented x 3 HEENT: Atraumatic, Normocephalic.  Ears and Nose: No external deformity. EXTR: No clubbing/cyanosis/edema NEURO: Normal gait.  PSYCH: Normally interactive. Conversant. Not depressed or anxious appearing.  Calm demeanor.    GEN:  Well-developed,well-nourished,in no acute distress; alert,appropriate and cooperative throughout examination HEENT: Normocephalic and atraumatic without obvious abnormalities. Ears, externally no deformities PULM: Breathing comfortably in no respiratory distress EXT: No clubbing, cyanosis, or edema PSYCH: Normally interactive. Cooperative during the interview. Pleasant. Friendly and conversant. Not anxious or depressed appearing. Normal, full affect.  CERVICAL SPINE EXAM Range of motion: Flexion, extension, lateral bending, and rotation: normal Pain with terminal motion: no Spinous Processes: NT SCM: NT Upper paracervical muscles: minimally tender Upper traps: NT C5-T1 intact, sensation and motor   Shoulder: B Inspection: No muscle wasting or winging Ecchymosis/edema: neg  AC joint, scapula, clavicle: NT Cervical spine: NT, full ROM Spurling's: neg Abduction: full, 5/5 Flexion: full, 5/5 IR, full, lift-off: 5/5 ER at neutral: full, 5/5 AC crossover and compression: neg Neer: neg Hawkins: neg Drop Test: neg Empty Can: neg Supraspinatus insertion: NT Bicipital groove: NT Speed's: neg Yergason's: neg Sulcus sign: neg Scapular dyskinesis: none C5-T1 intact Sensation intact Grip 5/5   Laboratory and Imaging Data:  Assessment & Plan:   Headache(784.0)  Cervical radiculopathy  I expect this is more on the order of tension headache.  Additionally, though her exam is relatively benign, her history is classical for some cervical radiculopathy. I did treat this as such. If symptoms are persistent, and an additional investigation or treatment would be indicated.  New Prescriptions   BUTALBITAL-ASPIRIN-CAFFEINE (FIORINAL) 50-325-40 MG PER CAPSULE    Take 1-2 capsules by mouth 2 (two) times daily as needed for headache.   PREDNISONE (DELTASONE) 20 MG TABLET    2 tabs  po for 5 days, then 1 tab po daily for 5 days   Modified Medications   No medications on file   No orders  of the defined types were placed in this encounter.   Follow-up: No Follow-up on file. Unless noted above, the patient is to follow-up if symptoms worsen. Red flags were reviewed with the patient.  Signed,  Maud Deed. Ariea Rochin, MD, CAQ Sports Medicine   Discontinued Medications   FLUCONAZOLE (DIFLUCAN) 150 MG TABLET    Take 1 tablet (150 mg total) by mouth once.   GLYCOPYRROLATE (ROBINUL) 2 MG TABLET    Take 1 tablet (2 mg total) by mouth 2 (two) times daily.   METRONIDAZOLE (FLAGYL) 500 MG TABLET    Take 1 tablet (500 mg total) by mouth 2 (two) times daily.   RANITIDINE (ZANTAC) 150 MG CAPSULE    Take 150 mg by mouth as needed.    TERCONAZOLE (TERAZOL 3) 0.8 % VAGINAL CREAM    Place 1 applicator vaginally at bedtime.   Current Medications at Discharge:   Medication List       This list is accurate as of: 01/24/14 11:59 PM.  Always use your most recent med list.               aspirin 325 MG tablet  Take 1 tablet (325 mg total) by mouth daily.     butalbital-aspirin-caffeine 50-325-40 MG per capsule  Commonly known as:  FIORINAL  Take 1-2 capsules by mouth 2 (two) times daily as needed for headache.     Hyoscyamine Sulfate 0.375 MG Tbcr  Take one tab twice a day as needed for abdominal pain and cramps     predniSONE 20 MG tablet  Commonly known as:  DELTASONE  2 tabs po for 5 days, then 1 tab po daily for 5 days

## 2014-02-05 ENCOUNTER — Telehealth: Payer: Self-pay | Admitting: Family Medicine

## 2014-02-05 NOTE — Telephone Encounter (Signed)
Please check with [facility] about this.  Thanks.

## 2014-02-05 NOTE — Telephone Encounter (Signed)
Appt scheduled for tomorrow with Dr. Lorelei Pont.

## 2014-02-05 NOTE — Telephone Encounter (Signed)
Patient Information:  Caller Name: Dangela  Phone: [phone]  Patient: Brittney [name], Brittney [name]  Gender: Female  DOB: 1980/10/21  Age: 33 Years  PCP: [facility], [facility]or, [facility])  Pregnant: No  Office Follow Up:  Does the office need to follow up with this patient?: Yes  Instructions For The Office: please call pt back today regarding appt for tomorrow  RN Note:  Pt would like an appt. All appts full for today. Pt is requesting appt for tomorrow since schedule full today. Her provider (Dr. Glori Bickers) not in tomorrow so she would like to specifically request an appt with Dr. Lorelei Pont. Dr. Serita Grit appts in first part of day tomorrow are "Same Day" so triage RN cannot schedule in those slots today. Assured her will send message with her request and someone will call her back today to discuss appt for tomorrow. Agreed to plan.  Symptoms  Reason For Call & Symptoms: Injury to smallest toe on R foot---ran into door frame yesterday. Today it is swollen and difficult to bend. States she broke a toe before and it feels the same. Moderate pain. Is able to walk and is able to wear flip-flops.  Reviewed Health History In EMR: Yes  Reviewed Medications In EMR: Yes  Reviewed Allergies In EMR: Yes  Reviewed Surgeries / Procedures: Yes  Date of Onset of Symptoms: 02/04/2014 OB / GYN:  LMP: 01/20/2014  Guideline(s) Used:  Toe Injury  Disposition Per Guideline:   See Today or Tomorrow in Office  Reason For Disposition Reached:   Patient wants to be seen  Advice Given:  Reassurance - Sprained Toe (Jammed, Stubbed Toe)  Sprain is the medical terms used to describe over-stretching of the ligaments of the toe. This can happen when the toe is twisted or bent in the wrong way. It can happen when the toe is "stubbed" while walking.  The main symptom is pain that is worse with movement. Swelling can occur. There is usually no bruising; if bruising is present it can be a sign that there is a small fracture.  Here  is some care advice that should help.  Apply a Cold Pack:  Apply a cold pack or an ice bag (wrapped in a moist towel) to the area for 20 minutes. Repeat in 1 hour, then every 4 hours while awake.  Continue this for the first 48 hours after an injury.  This will help decrease pain and swelling.  Buddy-Tape Splinting:  Protect your injured toe by "buddy-taping" it to the next toe.  Use 2 pieces of tape. Place one around the base of both toes. Place the other around the ends of both toes.  Wear a comfortable pair of shoes that do not rub on the injured toe.  Expected Course:  Pain and swelling usually start to get better 2 to 3 days after an injury.  Swelling most often is gone after 1 week.  It may take 2 weeks for the pain to go away.  Call Back If:  You become worse.  Patient Will Follow Care Advice:  YES

## 2014-02-06 ENCOUNTER — Ambulatory Visit (INDEPENDENT_AMBULATORY_CARE_PROVIDER_SITE_OTHER): Payer: BC Managed Care – PPO | Admitting: Family Medicine

## 2014-02-06 ENCOUNTER — Ambulatory Visit (INDEPENDENT_AMBULATORY_CARE_PROVIDER_SITE_OTHER)
Admission: RE | Admit: 2014-02-06 | Discharge: 2014-02-06 | Disposition: A | Discharge status: Home / Self Care | Payer: BC Managed Care – PPO | Source: Ambulatory Visit | Attending: Family Medicine | Admitting: Family Medicine

## 2014-02-06 ENCOUNTER — Encounter: Payer: Self-pay | Admitting: Family Medicine

## 2014-02-06 VITALS — BP 104/72 | HR 79 | Temp 98.3°F | Ht 68.0 in | Wt 279.5 lb

## 2014-02-06 DIAGNOSIS — S8990XA Unspecified injury of unspecified lower leg, initial encounter: Secondary | ICD-10-CM

## 2014-02-06 DIAGNOSIS — S99929A Unspecified injury of unspecified foot, initial encounter: Secondary | ICD-10-CM

## 2014-02-06 DIAGNOSIS — S99919A Unspecified injury of unspecified ankle, initial encounter: Secondary | ICD-10-CM

## 2014-02-06 DIAGNOSIS — S99922A Unspecified injury of left foot, initial encounter: Secondary | ICD-10-CM

## 2014-02-06 NOTE — Progress Notes (Signed)
   [place] [address] Phone: [phone] Fax: [phone]  Patient ID: Brittney [name] MRN: [id number], DOB: 1981-03-15, 33 y.o. Date of Encounter: 02/06/2014  Primary Physician:  Loura Pardon, MD   Chief Complaint: Toe Injury   Subjective:   History of Present Illness:  Brittney [name] is a 33 y.o. very pleasant female patient who presents with the following:  The patient ran into a door a few days ago and hit her fifth toe. This is been hurting quite a bit, and in that area it is been confined only to the fifth toe region.  Past Medical History, Surgical History, Social History, Family History, Problem List, Medications, and Allergies have been reviewed and updated if relevant.  Review of Systems:  GEN: No fevers, chills. Nontoxic. Primarily MSK c/o today. MSK: Detailed in the HPI GI: tolerating PO intake without difficulty Neuro: No numbness, parasthesias, or tingling associated. Otherwise the pertinent positives of the ROS are noted above.   Objective:   Physical Examination: BP 104/72  Pulse 79  Temp(Src) 98.3 F (36.8 C) (Oral)  Ht 5\' 8"  (1.727 m)  Wt 279 lb 8 oz (126.78 kg)  BMI 42.51 kg/m2  LMP 01/18/2014   GEN: WDWN, NAD, Non-toxic, Alert & Oriented x 3 HEENT: Atraumatic, Normocephalic.  Ears and Nose: No external deformity. EXTR: No clubbing/cyanosis/edema NEURO: Normal gait.  PSYCH: Normally interactive. Conversant. Not depressed or anxious appearing.  Calm demeanor.    Tender to palpation of the fifth MTP joint. No tenderness at the IP joint of the fifth toe. Mild tenderness along the shaft. Nontender along the metatarsals. Other foot and ankle exam is normal.  Radiology: Dg Toe 5th Right  02/06/2014   CLINICAL DATA:  Right fifth toe injury  EXAM: RIGHT FIFTH TOE  COMPARISON:  None.  FINDINGS: No definite displaced fracture or dislocation. Joint spaces are preserved. No definite erosions. Regional soft tissues appear normal.  No radiopaque foreign body.  IMPRESSION: No definite displaced fracture or radiopaque foreign body.   Electronically Signed   By: Sandi Mariscal M.D.   On: 02/06/2014 15:36    Assessment & Plan:   Toe injury, left, initial encounter - Plan: DG Toe 5th Right  Bone contusion and capsular injury. Suspect 2-3 weeks for resolution.  Orders Placed This Encounter  Procedures  . DG Toe 5th Right   Follow-up: No Follow-up on file. Unless noted above, the patient is to follow-up if symptoms worsen. Red flags were reviewed with the patient.  Signed,  Maud Deed. Copland, MD, CAQ Sports Medicine   Discontinued Medications   PREDNISONE (DELTASONE) 20 MG TABLET    2 tabs po for 5 days, then 1 tab po daily for 5 days   Current Medications at Discharge:   Medication List       This list is accurate as of: 02/06/14  3:48 PM.  Always use your most recent med list.               aspirin 325 MG tablet  Take 1 tablet (325 mg total) by mouth daily.     butalbital-aspirin-caffeine 50-325-40 MG per capsule  Commonly known as:  FIORINAL  Take 1-2 capsules by mouth 2 (two) times daily as needed for headache.     Hyoscyamine Sulfate 0.375 MG Tbcr  Take one tab twice a day as needed for abdominal pain and cramps

## 2014-02-06 NOTE — Progress Notes (Signed)
Pre visit review using our clinic review tool, if applicable. No additional management support is needed unless otherwise documented below in the visit note. 

## 2014-03-19 ENCOUNTER — Other Ambulatory Visit: Payer: Self-pay

## 2014-03-19 ENCOUNTER — Telehealth: Payer: Self-pay | Admitting: Gastroenterology

## 2014-03-19 MED ORDER — RIFAXIMIN 550 MG PO TABS: 550.0000 mg | ORAL_TABLET | Freq: Three times a day (TID) | ORAL | Status: DC

## 2014-03-19 NOTE — Telephone Encounter (Signed)
Spoke with the patient and informed of plan.

## 2014-03-19 NOTE — Telephone Encounter (Signed)
Patient reports up to 5 semi-formed to liquid bowel movements each day for the past 2-3 weeksShe is eating crackers and drinking ginger ale. She has been taking Imodium today to attempt control of the diarrhea. Denies recent ATB's, travel or changes of medication. No known exposure to illness. No bloody stools.

## 2014-03-19 NOTE — Telephone Encounter (Signed)
She has a diagnosis of IBS  Xifaxan 550 mg tid x 14 days is a newly approved Tx for this  She should try that

## 2014-04-18 ENCOUNTER — Encounter: Payer: Self-pay | Admitting: Nurse Practitioner

## 2014-04-18 ENCOUNTER — Ambulatory Visit (INDEPENDENT_AMBULATORY_CARE_PROVIDER_SITE_OTHER): Payer: BC Managed Care – PPO | Admitting: Nurse Practitioner

## 2014-04-18 ENCOUNTER — Telehealth: Payer: Self-pay | Admitting: Gastroenterology

## 2014-04-18 VITALS — BP 104/68 | HR 70 | Ht 69.0 in | Wt 269.0 lb

## 2014-04-18 DIAGNOSIS — K589 Irritable bowel syndrome without diarrhea: Secondary | ICD-10-CM

## 2014-04-18 DIAGNOSIS — K648 Other hemorrhoids: Secondary | ICD-10-CM

## 2014-04-18 MED ORDER — HYDROCORTISONE ACETATE 25 MG RE SUPP: RECTAL | Status: DC

## 2014-04-18 MED ORDER — DIPHENOXYLATE-ATROPINE 2.5-0.025 MG PO TABS: ORAL_TABLET | ORAL | Status: DC

## 2014-04-18 NOTE — Telephone Encounter (Signed)
Patient has had streaks of blood with her bm's. She has IBS. Recently took Xifaxan 550 mg tid x 14 days for c/o diarrhea.Appointment scheduled.

## 2014-04-18 NOTE — Patient Instructions (Signed)
Discontinue the Imodium. We have given you a prescription to take to your pharmacy, Lomotil. We sent a prescription electronically to [facility] for Anusol suppositories.  Call in 10 days with a progress report. Ask for [facility].

## 2014-04-22 ENCOUNTER — Encounter: Payer: Self-pay | Admitting: Nurse Practitioner

## 2014-04-22 DIAGNOSIS — K648 Other hemorrhoids: Secondary | ICD-10-CM | POA: Insufficient documentation

## 2014-04-22 NOTE — Progress Notes (Signed)
     History of Present Illness:  Patient is a 33 year old female known to Dr. Deatra Ina for a history of IBS and adenomatous colon polyps. She was seen January of this year for diarrhea. Stool studies negative. She improved with Robinul and Probiotics. Patient came back in March 2015, this time with constipation. She is up to date on colon polyp surveillance, last colonoscopy Feb 2013.   Patient here today for evaluation of recurrent crampy diarrhea, mainly postprandial. Stools do contain some blood. She is scared to eating for fear of having diarrhea while at work. Weight down about 20 pounds which she attributes to skipping meals. She is taking Imodium. No recent antibiotics.  No med changes at home  Current Medications, Allergies, Past Medical History, Past Surgical History, Family History and Social History were reviewed in [employer] record.  Physical Exam: General: Pleasant, obese , black female in no acute distress Head: Normocephalic and atraumatic Eyes:  sclerae anicteric, conjunctiva pink  Ears: Normal auditory acuity Lungs: Clear throughout to auscultation Heart: Regular rate and rhythm Abdomen: Soft, non distended, non-tender. No masses, no hepatomegaly. Normal bowel sounds Rectal: mildly inflamed internal hemorrhoids on anosocopy Musculoskeletal: Symmetrical with no gross deformities  Extremities: No edema  Neurological: Alert oriented x 4, grossly nonfocal Psychological:  Alert and cooperative. Normal mood and affect  Assessment and Recommendations:  70. 33 year old female with IBS presenting with recurrent crampy diarrhea. Suspect IBS flare. D/C imodium, trial of lomotil TID before meals, hold for constipation. Patient inquires about need for another colonoscopy. No need for repeat colonoscopy at this point. Of course she will need further evaluation if symptoms don'[t improve.  2. Rectal bleeding. Suspect related to internal hemorrhoids. Trial of  steroid suppositories. Further workup if no improvement.  Patient will call with condition update in a few days

## 2014-04-22 NOTE — Progress Notes (Signed)
Reviewed and agree with management. Marika Mahaffy D. Mansur Patti, M.D., FACG  

## 2014-04-28 ENCOUNTER — Telehealth: Payer: Self-pay | Admitting: Nurse Practitioner

## 2014-04-28 NOTE — Telephone Encounter (Signed)
Spoke with patient and she is calling with update for Tye Savoy, NP. She had been doing better until Saturday and Sunday when she started having diarrhea and bleeding again. States she has completed the suppositories and is almost out of Lomotil. She states the Lomotil had helped the diarrhea until Saturday. Please, advise.

## 2014-04-29 MED ORDER — HYDROCORTISONE ACETATE 25 MG RE SUPP
RECTAL | Status: DC
Start: 2014-04-29 — End: 2014-06-26

## 2014-04-29 MED ORDER — DIPHENOXYLATE-ATROPINE 2.5-0.025 MG PO TABS: ORAL_TABLET | ORAL | Status: DC

## 2014-04-29 NOTE — Telephone Encounter (Signed)
Patient notified of recommendations. Rx's sent. Scheduled OV with Dr. Deatra Ina on 06/26/14 at 9:45 AM.

## 2014-04-29 NOTE — Telephone Encounter (Signed)
Regina, please refill lomotil and give her 7 more days of steroid suppositories. Lets get her an appointment with Dr. Deatra Ina who may be able to band internal hemorrhoids if she has persistent bleeding. Thanks

## 2014-04-29 NOTE — Addendum Note (Signed)
Addended by: Hulan Saas on: 04/29/2014 04:10 PM   Modules accepted: Orders

## 2014-06-16 ENCOUNTER — Encounter: Payer: Self-pay | Admitting: Nurse Practitioner

## 2014-06-26 ENCOUNTER — Ambulatory Visit (INDEPENDENT_AMBULATORY_CARE_PROVIDER_SITE_OTHER): Payer: BC Managed Care – PPO | Admitting: Gastroenterology

## 2014-06-26 ENCOUNTER — Encounter: Payer: Self-pay | Admitting: Gastroenterology

## 2014-06-26 VITALS — BP 100/70 | HR 76 | Ht 66.25 in | Wt 272.4 lb

## 2014-06-26 DIAGNOSIS — K589 Irritable bowel syndrome without diarrhea: Secondary | ICD-10-CM

## 2014-06-26 DIAGNOSIS — K648 Other hemorrhoids: Secondary | ICD-10-CM

## 2014-06-26 NOTE — Assessment & Plan Note (Signed)
She was advised to take Lomotil only when she's having diarrhea and may take up to 2 tablets every 6 hours provided that she does not develop abdominal distention.  She was encouraged to continue fiber supplementation.

## 2014-06-26 NOTE — Progress Notes (Signed)
      History of Present Illness:  Ms. Eugenia Mcalpine returned complaining of diarrhea.  She has IBS with both constipation and diarrhea.  She takes one Lomotil before each meal daily.  Diarrhea is intermittent.  It is not related to any particular foods.  She denies abdominal pain.  Rectal bleeding has entirely subsided.    Review of Systems: Pertinent positive and negative review of systems were noted in the above HPI section. All other review of systems were otherwise negative.    Current Medications, Allergies, Past Medical History, Past Surgical History, Family History and Social History were reviewed in [facility] record  Vital signs were reviewed in today's medical record. Physical Exam: General: Well developed , well nourished, no acute distress   See Assessment and Plan under Problem List

## 2014-06-26 NOTE — Patient Instructions (Signed)
Follow up as needed

## 2014-06-26 NOTE — Assessment & Plan Note (Signed)
She has had no further rectal bleeding for several months and denies rectal pain.  Plan no further therapy.

## 2014-07-02 ENCOUNTER — Emergency Department: Payer: Self-pay | Admitting: Emergency Medicine

## 2014-07-02 LAB — COMPREHENSIVE METABOLIC PANEL
ALK PHOS: 93 U/L
Albumin: 3.5 g/dL (ref 3.4–5.0)
Anion Gap: 2 — ABNORMAL LOW (ref 7–16)
BUN: 10 mg/dL (ref 7–18)
Bilirubin,Total: 0.4 mg/dL (ref 0.2–1.0)
Calcium, Total: 8.7 mg/dL (ref 8.5–10.1)
Chloride: 107 mmol/L (ref 98–107)
Co2: 31 mmol/L (ref 21–32)
Creatinine: 1.07 mg/dL (ref 0.60–1.30)
EGFR (African American): >60
EGFR (Non-African Amer.): >60
Glucose: 89 mg/dL (ref 65–99)
Osmolality: 278 (ref 275–301)
POTASSIUM: 4.2 mmol/L (ref 3.5–5.1)
SGOT(AST): 26 U/L (ref 15–37)
SGPT (ALT): 25 U/L
SODIUM: 140 mmol/L (ref 136–145)
TOTAL PROTEIN: 7 g/dL (ref 6.4–8.2)

## 2014-07-02 LAB — CBC
HCT: 36.1 % (ref 35.0–47.0)
HGB: 11.4 g/dL — ABNORMAL LOW (ref 12.0–16.0)
MCH: 25.5 pg — AB (ref 26.0–34.0)
MCHC: 31.5 g/dL — ABNORMAL LOW (ref 32.0–36.0)
MCV: 81 fL (ref 80–100)
Platelet: 260 10*3/uL (ref 150–440)
RBC: 4.47 10*6/uL (ref 3.80–5.20)
RDW: 16.9 % — ABNORMAL HIGH (ref 11.5–14.5)
WBC: 10 10*3/uL (ref 3.6–11.0)

## 2014-07-02 LAB — TROPONIN I
Troponin-I: <0.02 ng/mL
Troponin-I: <0.02 ng/mL

## 2014-07-02 LAB — LIPASE, BLOOD: LIPASE: 103 U/L (ref 73–393)

## 2014-07-03 ENCOUNTER — Encounter: Payer: Self-pay | Admitting: Cardiovascular Disease

## 2014-07-03 ENCOUNTER — Ambulatory Visit (INDEPENDENT_AMBULATORY_CARE_PROVIDER_SITE_OTHER): Payer: BC Managed Care – PPO | Admitting: Cardiovascular Disease

## 2014-07-03 VITALS — BP 98/70 | HR 70 | Ht 66.0 in | Wt 270.0 lb

## 2014-07-03 DIAGNOSIS — I201 Angina pectoris with documented spasm: Secondary | ICD-10-CM

## 2014-07-03 DIAGNOSIS — R0789 Other chest pain: Secondary | ICD-10-CM

## 2014-07-03 MED ORDER — NITROGLYCERIN 0.4 MG SL SUBL: 0.4000 mg | SUBLINGUAL_TABLET | SUBLINGUAL | Status: DC | PRN

## 2014-07-03 MED ORDER — ISOSORBIDE MONONITRATE ER 30 MG PO TB24: 30.0000 mg | ORAL_TABLET | Freq: Every day | ORAL | Status: DC

## 2014-07-03 NOTE — Patient Instructions (Signed)
Start Isosorbide (Imdur) 30 mg once daily.  Use Nitroglycerine under the tongue as needed for chest pain as instructed.   Follow up in 3 months.

## 2014-07-03 NOTE — Progress Notes (Signed)
HPI  This is a pleasant 33 year old African American female who is here today for a followup visit regarding  chest pain and suspected coronary spasm. She has no previous cardiac history. She does have family history of premature coronary artery disease but no other risk factors for coronary artery disease. She was seen in 06/2013 for substernal chest tightness. Workup at [facility] emergency room was negative. Upon followup in the clinic, she was noted to have an abnormal EKG with  1 mm of ST elevation in 1 and aVL with minor ST depression in the inferior leads. We did an echocardiogram which showed normal LV systolic function and wall motion. She was asymptomatic at that time. She underwent a treadmill nuclear stress test  which showed no evidence of ischemia with normal ejection fraction. EKG was back to normal.  She had no further episodes up until recently when she had one episode of substernal chest tightness while she was at school. She was taken to the emergency room at [facility]. The CBC showed no significant ST changes. Cardiac enzymes were negative. Chest x-ray showed no acute abnormalities.  Allergies  Allergen Reactions  . Lansoprazole     REACTION: rash     Current Outpatient Prescriptions on File Prior to Visit  Medication Sig Dispense Refill  . diphenoxylate-atropine (LOMOTIL) 2.5-0.025 MG per tablet One tablet 30 min before meals 30 tablet 0   No current facility-administered medications on file prior to visit.     Past Medical History  Diagnosis Date  . Irritable bowel syndrome   . Obesity, unspecified   . Fibroids   . Migraine   . Anemia      Past Surgical History  Procedure Laterality Date  . Colonoscopy  11/2001    polyp; negative pathology  . Colonoscopy  09/2004    Negative  . Uterine US  09/2005    Large endometrial stripe; small ovarian cyst  . Laparoscopic decortication / dubulking / ablation renal cysts      saline histogram  . Tonsillectomy  07/30/07  .  Esophagogastroduodenoscopy  1/10    normal  . Colonoscopy w/ polypectomy    . Cholecystectomy  05/06/11  . Flexible sigmoidoscopy  03/05/2012    Procedure: FLEXIBLE SIGMOIDOSCOPY;  Surgeon: Inda Castle, MD;  Location: [facility];  Service: Endoscopy;  Laterality: N/A;     Family History  Problem Relation Age of Onset  . Ulcers Brother   . Clotting disorder Brother   . Colon cancer Maternal Grandmother   . Ovarian cancer Maternal Grandmother   . Diabetes Paternal Grandmother   . Hypertension Paternal Grandmother   . Diabetes Paternal Aunt   . Heart disease Father   . Hypertension Father   . Heart attack Father   . Breast cancer Maternal Aunt     Age 43's  . Diabetes Paternal Aunt     x 3  . Lung cancer Maternal Grandfather     SMOKER     History   Social History  . Marital Status: Married    Spouse Name: N/A    Number of Children: 2  . Years of Education: N/A   Occupational History  . Teacher    Social History Main Topics  . Smoking status: Never Smoker   . Smokeless tobacco: Never Used  . Alcohol Use: No     Comment: rare  . Drug Use: No  . Sexual Activity: Yes    Birth Control/ Protection: Condom, IUD   Other  Topics Concern  . 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 Use-soda           ROS A 10 point review of system was performed. It is negative other than that mentioned in the history of present illness.   PHYSICAL EXAM   BP 98/70 mmHg  Pulse 70  Ht 5\' 6"  (1.676 m)  Wt 270 lb (122.471 kg)  BMI 43.60 kg/m2  LMP 05/29/2014 Constitutional: She is oriented to person, place, and time. She appears well-developed and well-nourished. No distress.  HENT: No nasal discharge.  Head: Normocephalic and atraumatic.  Eyes: Pupils are equal and round. No discharge.  Neck: Normal range of motion. Neck supple. No JVD present. No thyromegaly present.  Cardiovascular: Normal rate, regular  rhythm, normal heart sounds. Exam reveals no gallop and no friction rub. No murmur heard.  Pulmonary/Chest: Effort normal and breath sounds normal. No stridor. No respiratory distress. She has no wheezes. She has no rales. She exhibits no tenderness.  Abdominal: Soft. Bowel sounds are normal. She exhibits no distension. There is no tenderness. There is no rebound and no guarding.  Musculoskeletal: Normal range of motion. She exhibits no edema and no tenderness.  Neurological: She is alert and oriented to person, place, and time. Coordination normal.  Skin: Skin is warm and dry. No rash noted. She is not diaphoretic. No erythema. No pallor.  Psychiatric: She has a normal mood and affect. Her behavior is normal. Judgment and thought content normal.     CBS:WHQPR  Rhythm  Borderline LVH   ASSESSMENT AND PLAN

## 2014-07-04 DIAGNOSIS — I201 Angina pectoris with documented spasm: Secondary | ICD-10-CM | POA: Insufficient documentation

## 2014-07-04 NOTE — Assessment & Plan Note (Signed)
The patient previous presentation with chest pain and current recurrent symptoms are suggestive of coronary spasm. Previous cardiac workup was negative including a nuclear stress test and an echocardiogram. I added Imdur 30 mg once daily and provided her with sublingual nitroglycerin to be used as needed.

## 2014-07-13 ENCOUNTER — Ambulatory Visit: Payer: Self-pay | Admitting: Physician Assistant

## 2014-07-13 LAB — URINALYSIS, COMPLETE
BILIRUBIN, UR: NEGATIVE
Blood: NEGATIVE
Glucose,UR: NEGATIVE
Ketone: NEGATIVE
Nitrite: NEGATIVE
Ph: 7 (ref 5.0–8.0)
Protein: NEGATIVE
SPECIFIC GRAVITY: 1.025 (ref 1.000–1.030)

## 2014-08-25 ENCOUNTER — Telehealth: Payer: Self-pay | Admitting: Family Medicine

## 2014-08-25 NOTE — Telephone Encounter (Signed)
Come in for an appt with the form - we will fill it out together and do the referral  If the form is very simple and only needs a signature and diagnosis - she can fax it , but anything more than that- follow up

## 2014-08-25 NOTE — Telephone Encounter (Signed)
Pt hasn't been seen recently so she is requesting to schedule a f/u appt. And will bring the form then, 51min appt scheduled on 09/02/14

## 2014-08-25 NOTE — Telephone Encounter (Signed)
Patient said she has been going to [facility] for several years with no results.  Patient would like to be referred to [facility].  [facility] has a new patient referral form that has to be filled out by her pcp  before she can make an appointment.  Patient wants to know if you need her to come in for an appointment or if she can fax the form to you to be filled out.

## 2014-08-29 ENCOUNTER — Encounter: Payer: Self-pay | Admitting: Family Medicine

## 2014-08-29 ENCOUNTER — Ambulatory Visit (INDEPENDENT_AMBULATORY_CARE_PROVIDER_SITE_OTHER): Payer: BC Managed Care – PPO | Admitting: Family Medicine

## 2014-08-29 VITALS — BP 100/68 | HR 60 | Temp 98.0°F | Ht 66.0 in | Wt 288.4 lb

## 2014-08-29 DIAGNOSIS — K589 Irritable bowel syndrome without diarrhea: Secondary | ICD-10-CM

## 2014-08-29 DIAGNOSIS — Z13220 Encounter for screening for lipoid disorders: Secondary | ICD-10-CM

## 2014-08-29 DIAGNOSIS — K529 Noninfective gastroenteritis and colitis, unspecified: Secondary | ICD-10-CM | POA: Insufficient documentation

## 2014-08-29 LAB — LIPID PANEL
Cholesterol: 150 mg/dL (ref 0–200)
HDL: 39.2 mg/dL (ref >39.00)
LDL Cholesterol: 103 mg/dL — ABNORMAL HIGH (ref 0–99)
NonHDL: 110.8
TRIGLYCERIDES: 38 mg/dL (ref 0.0–149.0)
Total CHOL/HDL Ratio: 4
VLDL: 7.6 mg/dL (ref 0.0–40.0)

## 2014-08-29 LAB — COMPREHENSIVE METABOLIC PANEL
ALT: 10 U/L (ref 0–35)
AST: 13 U/L (ref 0–37)
Albumin: 3.8 g/dL (ref 3.5–5.2)
Alkaline Phosphatase: 80 U/L (ref 39–117)
BUN: 11 mg/dL (ref 6–23)
CO2: 26 mEq/L (ref 19–32)
CREATININE: 0.98 mg/dL (ref 0.40–1.20)
Calcium: 9.3 mg/dL (ref 8.4–10.5)
Chloride: 108 mEq/L (ref 96–112)
GFR: 82.84 mL/min (ref >60.00)
GLUCOSE: 81 mg/dL (ref 70–99)
Potassium: 4.1 mEq/L (ref 3.5–5.1)
SODIUM: 139 meq/L (ref 135–145)
TOTAL PROTEIN: 7.1 g/dL (ref 6.0–8.3)
Total Bilirubin: 0.3 mg/dL (ref 0.2–1.2)

## 2014-08-29 LAB — CBC WITH DIFFERENTIAL/PLATELET
BASOS PCT: 0.4 % (ref 0.0–3.0)
Basophils Absolute: 0 10*3/uL (ref 0.0–0.1)
EOS ABS: 0.1 10*3/uL (ref 0.0–0.7)
Eosinophils Relative: 0.9 % (ref 0.0–5.0)
HCT: 36.7 % (ref 36.0–46.0)
Hemoglobin: 11.4 g/dL — ABNORMAL LOW (ref 12.0–15.0)
Lymphocytes Relative: 32 % (ref 12.0–46.0)
Lymphs Abs: 2.2 10*3/uL (ref 0.7–4.0)
MCHC: 31.1 g/dL (ref 30.0–36.0)
MCV: 80.1 fl (ref 78.0–100.0)
Monocytes Absolute: 0.4 10*3/uL (ref 0.1–1.0)
Monocytes Relative: 6.5 % (ref 3.0–12.0)
NEUTROS ABS: 4.1 10*3/uL (ref 1.4–7.7)
NEUTROS PCT: 60.2 % (ref 43.0–77.0)
Platelets: 312 10*3/uL (ref 150.0–400.0)
RBC: 4.59 Mil/uL (ref 3.87–5.11)
RDW: 17.8 % — ABNORMAL HIGH (ref 11.5–15.5)
WBC: 6.8 10*3/uL (ref 4.0–10.5)

## 2014-08-29 LAB — TSH: TSH: 1.31 u[IU]/mL (ref 0.35–4.50)

## 2014-08-29 NOTE — Patient Instructions (Signed)
Labs today  Stop at check out for GI referral

## 2014-08-29 NOTE — Progress Notes (Signed)
Pre visit review using our clinic review tool, if applicable. No additional management support is needed unless otherwise documented below in the visit note. 

## 2014-08-29 NOTE — Progress Notes (Signed)
Subjective:    Patient ID: Brittney [name], female    DOB: 08-21-1980, 34 y.o.   MRN: [id number]  HPI Here for IBS   Has been going to Dr Deatra Ina for years  Intermittent constipation and diarrhea-now mostly diarrhea  colonosc 12/13 - poss proctitis , small polyp  09/12/13- stool path panel with neg results Stool cx and viral testss and giardia and crypto and c diff and camplobacter   Still takes lomotil before every meal   Loose bowels- 5-6 times a day minimum  Worse after eating - urgency to have stools  Still occ blood in stool- just a little   Have to have someone cover her for bathroom visits at work   No particular foods make her worse at all  Stress does make her worse  No heartburn   Not taking fiber because it does not help   Wants ref to [facility] for further eval   Also needs a lipid screen for work  Diet is fairly good   Patient Active Problem List   Diagnosis Date Noted  . Coronary artery spasm 07/04/2014  . Hemorrhoids, internal, with bleeding 04/22/2014  . Vaginitis 11/12/2013  . Family history of pulmonary embolism 09/02/2013  . Family history of diabetes mellitus 09/02/2013  . Bacterial vaginosis 07/30/2013  . Chest pain 07/09/2013  . AOM (acute otitis media) 06/10/2013  . Cutaneous skin tags 05/15/2013  . Sebaceous cyst of left axilla 05/15/2013  . DUB (dysfunctional uterine bleeding) 09/24/2012  . Endometritis 09/24/2012  . Abrasion of left ear 09/17/2012  . 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 EDEMA 11/04/2009  . WEIGHT GAIN 07/23/2008  . OBESITY 03/31/2008  . HEARING LOSS, MILD 03/31/2008  . MIGRAINE HEADACHE 02/20/2007  . IBS 02/20/2007  . OSTEOARTHRITIS 02/20/2007   Past Medical History  Diagnosis Date  . Irritable bowel syndrome   . Obesity, unspecified   . Fibroids   . Migraine   . Anemia    Past Surgical History  Procedure Laterality Date  . Colonoscopy  11/2001    polyp; negative pathology  . Colonoscopy  09/2004   Negative  . Uterine US  09/2005    Large endometrial stripe; small ovarian cyst  . Laparoscopic decortication / dubulking / ablation renal cysts      saline histogram  . Tonsillectomy  07/30/07  . Esophagogastroduodenoscopy  1/10    normal  . Colonoscopy w/ polypectomy    . Cholecystectomy  05/06/11  . Flexible sigmoidoscopy  03/05/2012    Procedure: FLEXIBLE SIGMOIDOSCOPY;  Surgeon: Inda Castle, MD;  Location: [facility];  Service: Endoscopy;  Laterality: N/A;   History  Substance Use Topics  . Smoking status: Never Smoker   . Smokeless tobacco: Never Used  . Alcohol Use: No     Comment: rare   Family History  Problem Relation Age of Onset  . Ulcers Brother   . Clotting disorder Brother   . Colon cancer Maternal Grandmother   . Ovarian cancer Maternal Grandmother   . Diabetes Paternal Grandmother   . Hypertension Paternal Grandmother   . Diabetes Paternal Aunt   . Heart disease Father   . Hypertension Father   . Heart attack Father   . Breast cancer Maternal Aunt     Age 63's  . Diabetes Paternal Aunt     x 3  . Lung cancer Maternal Grandfather     SMOKER   Allergies  Allergen Reactions  . Lansoprazole  REACTION: rash   Current Outpatient Prescriptions on File Prior to Visit  Medication Sig Dispense Refill  . diphenoxylate-atropine (LOMOTIL) 2.5-0.025 MG per tablet One tablet 30 min before meals 30 tablet 0  . isosorbide mononitrate (IMDUR) 30 MG 24 hr tablet Take 1 tablet (30 mg total) by mouth daily. 30 tablet 6  . nitroGLYCERIN (NITROSTAT) 0.4 MG SL tablet Place 1 tablet (0.4 mg total) under the tongue every 5 (five) minutes as needed for chest pain. 25 tablet 6   No current facility-administered medications on file prior to visit.       Review of Systems Review of Systems  Constitutional: Negative for fever, appetite change, fatigue and unexpected weight change.  Eyes: Negative for pain and visual disturbance.  Respiratory: Negative for cough and  shortness of breath.   Cardiovascular: Negative for cp or palpitations    Gastrointestinal: Negative for nausea, vomiting or dark stools, pos for diarrhea/ cramping  Genitourinary: Negative for urgency and frequency.  Skin: Negative for pallor or rash   Neurological: Negative for weakness, light-headedness, numbness and headaches.  Hematological: Negative for adenopathy. Does not bruise/bleed easily.  Psychiatric/Behavioral: Negative for dysphoric mood. The patient is not nervous/anxious.         Objective:   Physical Exam  Constitutional: She appears well-developed and well-nourished. No distress.  obese and well appearing   HENT:  Head: Normocephalic and atraumatic.  Mouth/Throat: Oropharynx is clear and moist.  Eyes: Conjunctivae and EOM are normal. Pupils are equal, round, and reactive to light. No scleral icterus.  Neck: Normal range of motion. Neck supple. No JVD present. Carotid bruit is not present. No thyromegaly present.  Cardiovascular: Normal rate and regular rhythm.   Pulmonary/Chest: Effort normal and breath sounds normal. No respiratory distress. She has no wheezes. She has no rales.  Abdominal: Soft. Bowel sounds are normal. She exhibits no distension and no mass. There is no hepatosplenomegaly. There is tenderness in the right lower quadrant and left lower quadrant. There is no rigidity, no rebound, no guarding, no CVA tenderness, no tenderness at McBurney's point and negative Murphy's sign. No hernia.  Musculoskeletal: She exhibits no edema.  Lymphadenopathy:    She has no cervical adenopathy.  Neurological: She is alert. She has normal reflexes. No cranial nerve deficit. She exhibits normal muscle tone. Coordination normal.  Skin: Skin is warm and dry. No rash noted. No erythema. No pallor.  Psychiatric: She has a normal mood and affect.          Assessment & Plan:   Problem List Items Addressed This Visit      Digestive   Chronic diarrhea    See IBS  assessment Much worse lately  Rev prior GI records and studies  Lab today  Ref to [facility] today      Relevant Orders   Ambulatory referral to Gastroenterology   CBC with Differential (Completed)   Comprehensive metabolic panel (Completed)   TSH (Completed)   IBS - Primary    Rev hx in detail today  Has been mixed in the past and now diarrhea predominant - causing much problem with work / bathroom breaks [employer]) Fair amt of cramping and also chronic pain  occ mild blood in stool colonosc 2013 - proctitis and 1 small polyp, CT was done also in 2013 Stool path panel - 1/15 normal   She desires ref to tertiary care center  Will continue lomotil   Ref done       Relevant Orders  Ambulatory referral to Gastroenterology     Other   Screening for lipoid disorders    Lipids today       Relevant Orders   Lipid panel (Completed)

## 2014-08-29 NOTE — Assessment & Plan Note (Signed)
See IBS assessment Much worse lately  Rev prior GI records and studies  Lab today  Ref to [facility] today

## 2014-08-29 NOTE — Assessment & Plan Note (Signed)
Rev hx in detail today  Has been mixed in the past and now diarrhea predominant - causing much problem with work / bathroom breaks [employer]) Fair amt of cramping and also chronic pain  occ mild blood in stool colonosc 2013 - proctitis and 1 small polyp, CT was done also in 2013 Stool path panel - 1/15 normal   She desires ref to tertiary care center  Will continue lomotil   Ref done

## 2014-08-29 NOTE — Assessment & Plan Note (Signed)
Lipids today

## 2014-09-02 ENCOUNTER — Ambulatory Visit: Payer: BC Managed Care – PPO | Admitting: Family Medicine

## 2014-09-25 ENCOUNTER — Other Ambulatory Visit: Payer: Self-pay | Admitting: Obstetrics and Gynecology

## 2014-09-26 LAB — CYTOLOGY - PAP

## 2014-09-30 ENCOUNTER — Ambulatory Visit (INDEPENDENT_AMBULATORY_CARE_PROVIDER_SITE_OTHER): Payer: BC Managed Care – PPO | Admitting: Family Medicine

## 2014-09-30 ENCOUNTER — Encounter: Payer: Self-pay | Admitting: Family Medicine

## 2014-09-30 VITALS — BP 112/68 | HR 64 | Temp 98.5°F | Ht 68.0 in | Wt 263.0 lb

## 2014-09-30 DIAGNOSIS — K648 Other hemorrhoids: Secondary | ICD-10-CM

## 2014-09-30 DIAGNOSIS — K589 Irritable bowel syndrome without diarrhea: Secondary | ICD-10-CM

## 2014-09-30 DIAGNOSIS — K644 Residual hemorrhoidal skin tags: Secondary | ICD-10-CM | POA: Insufficient documentation

## 2014-09-30 MED ORDER — HYDROCORTISONE ACETATE 25 MG RE SUPP: 25.0000 mg | Freq: Every day | RECTAL | Status: DC

## 2014-09-30 MED ORDER — HYDROCORTISONE 2.5 % RE CREA: 1.0000 "application " | TOPICAL_CREAM | Freq: Four times a day (QID) | RECTAL | Status: DC

## 2014-09-30 NOTE — Progress Notes (Signed)
Pre visit review using our clinic review tool, if applicable. No additional management support is needed unless otherwise documented below in the visit note. 

## 2014-09-30 NOTE — Assessment & Plan Note (Signed)
Non thrombosed but enlarged and tender ext hemorrhoid at 2:00 approx  No imp with prep H  Handout given on hemorrhoids Disc symptomatic care - see instructions on AVS  inst to avoid straining  Px anusol HC  cream to use up to QID to get this under control and also adv TUKS pads otc if helpful Also given suppositories to use qhs in light of hx of internal hemorrhoids (no bleeding currently)

## 2014-09-30 NOTE — Progress Notes (Signed)
Subjective:    Patient ID: Brittney [name], female    DOB: Dec 15, 1980, 34 y.o.   MRN: [id number]  HPI Here for ? Of hemorrhoid   Noticed a prominent area at rectum  Is sore and sensitive - esp if walking (better lying down)  No bleeding  Did prep H -no help    Is on new med for chronic diarrhea - from [facility] (IBS) Working really well  No straining -no constipation  Much better   Patient Active Problem List   Diagnosis Date Noted  . Chronic diarrhea 08/29/2014  . Screening for lipoid disorders 08/29/2014  . Coronary artery spasm 07/04/2014  . Hemorrhoids, internal, with bleeding 04/22/2014  . Vaginitis 11/12/2013  . Family history of pulmonary embolism 09/02/2013  . Family history of diabetes mellitus 09/02/2013  . Bacterial vaginosis 07/30/2013  . Chest pain 07/09/2013  . AOM (acute otitis media) 06/10/2013  . Cutaneous skin tags 05/15/2013  . Sebaceous cyst of left axilla 05/15/2013  . DUB (dysfunctional uterine bleeding) 09/24/2012  . Endometritis 09/24/2012  . Abrasion of left ear 09/17/2012  . 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 EDEMA 11/04/2009  . WEIGHT GAIN 07/23/2008  . OBESITY 03/31/2008  . HEARING LOSS, MILD 03/31/2008  . MIGRAINE HEADACHE 02/20/2007  . IBS 02/20/2007  . OSTEOARTHRITIS 02/20/2007   Past Medical History  Diagnosis Date  . Irritable bowel syndrome   . Obesity, unspecified   . Fibroids   . Migraine   . Anemia    Past Surgical History  Procedure Laterality Date  . Colonoscopy  11/2001    polyp; negative pathology  . Colonoscopy  09/2004    Negative  . Uterine US  09/2005    Large endometrial stripe; small ovarian cyst  . Laparoscopic decortication / dubulking / ablation renal cysts      saline histogram  . Tonsillectomy  07/30/07  . Esophagogastroduodenoscopy  1/10    normal  . Colonoscopy w/ polypectomy    . Cholecystectomy  05/06/11  . Flexible sigmoidoscopy  03/05/2012    Procedure: FLEXIBLE SIGMOIDOSCOPY;  Surgeon:  Inda Castle, MD;  Location: [facility];  Service: Endoscopy;  Laterality: N/A;   History  Substance Use Topics  . Smoking status: Never Smoker   . Smokeless tobacco: Never Used  . Alcohol Use: No     Comment: rare   Family History  Problem Relation Age of Onset  . Ulcers Brother   . Clotting disorder Brother   . Colon cancer Maternal Grandmother   . Ovarian cancer Maternal Grandmother   . Diabetes Paternal Grandmother   . Hypertension Paternal Grandmother   . Diabetes Paternal Aunt   . Heart disease Father   . Hypertension Father   . Heart attack Father   . Breast cancer Maternal Aunt     Age 76's  . Diabetes Paternal Aunt     x 3  . Lung cancer Maternal Grandfather     SMOKER   Allergies  Allergen Reactions  . Lansoprazole     REACTION: rash   Current Outpatient Prescriptions on File Prior to Visit  Medication Sig Dispense Refill  . isosorbide mononitrate (IMDUR) 30 MG 24 hr tablet Take 1 tablet (30 mg total) by mouth daily. (Patient not taking: Reported on 09/30/2014) 30 tablet 6  . nitroGLYCERIN (NITROSTAT) 0.4 MG SL tablet Place 1 tablet (0.4 mg total) under the tongue every 5 (five) minutes as needed for chest pain. (Patient not taking: Reported on 09/30/2014) 25  tablet 6   No current facility-administered medications on file prior to visit.      Review of Systems Review of Systems  Constitutional: Negative for fever, appetite change, fatigue and unexpected weight change.  Eyes: Negative for pain and visual disturbance.  Respiratory: Negative for cough and shortness of breath.   Cardiovascular: Negative for cp or palpitations    Gastrointestinal: Negative for nausea, diarrhea and constipation. neg for blood in stool Genitourinary: Negative for urgency and frequency.  Skin: Negative for pallor or rash   Neurological: Negative for weakness, light-headedness, numbness and headaches.  Hematological: Negative for adenopathy. Does not bruise/bleed easily.    Psychiatric/Behavioral: Negative for dysphoric mood. The patient is not nervous/anxious.         Objective:   Physical Exam  Constitutional: She appears well-developed and well-nourished. No distress.  obese and well appearing   Eyes: Conjunctivae and EOM are normal. Pupils are equal, round, and reactive to light. No scleral icterus.  Neck: Normal range of motion. Neck supple.  Cardiovascular: Normal rate and regular rhythm.   Abdominal: Soft. Bowel sounds are normal. She exhibits no distension. There is no tenderness.  Genitourinary:  Rectal exam - external hemorrhoid at 2:00 moderate in size and tender No bleeding Nl rectal tone     Lymphadenopathy:    She has no cervical adenopathy.  Neurological: She is alert.  Skin: Skin is warm and dry. No rash noted. No erythema.  Psychiatric: She has a normal mood and affect.          Assessment & Plan:   Problem List Items Addressed This Visit      Cardiovascular and Mediastinum   Hemorrhoids, external - Primary    Non thrombosed but enlarged and tender ext hemorrhoid at 2:00 approx  No imp with prep H  Handout given on hemorrhoids Disc symptomatic care - see instructions on AVS  inst to avoid straining  Px anusol HC  cream to use up to QID to get this under control and also adv TUKS pads otc if helpful Also given suppositories to use qhs in light of hx of internal hemorrhoids (no bleeding currently)        Digestive   IBS

## 2014-09-30 NOTE — Patient Instructions (Signed)
I think you have an inflamed external hemorrhoid    (also internal ones that are not flared)  Cool compresses and warm sitz baths can help  Avoid straining TUKS pads over the counter are good for clean up after a bowel movement  Use the ansusol cream up to 4 times daily The suppository at bedtime for 10 days  Update if not starting to improve in a week or if worsening

## 2014-10-03 ENCOUNTER — Ambulatory Visit: Payer: BC Managed Care – PPO | Admitting: Cardiovascular Disease

## 2014-10-20 ENCOUNTER — Other Ambulatory Visit: Payer: Self-pay | Admitting: Obstetrics and Gynecology

## 2014-11-13 ENCOUNTER — Encounter (HOSPITAL_BASED_OUTPATIENT_CLINIC_OR_DEPARTMENT_OTHER): Admission: RE | Payer: Self-pay | Source: Ambulatory Visit

## 2014-11-13 ENCOUNTER — Ambulatory Visit (HOSPITAL_BASED_OUTPATIENT_CLINIC_OR_DEPARTMENT_OTHER)
Admission: RE | Admit: 2014-11-13 | Payer: BC Managed Care – PPO | Source: Ambulatory Visit | Admitting: Obstetrics and Gynecology

## 2014-11-13 SURGERY — LIGATION, FALLOPIAN TUBE, LAPAROSCOPIC
Anesthesia: Choice | Laterality: Bilateral

## 2014-12-07 NOTE — Discharge Summary (Signed)
PATIENT NAME:  Brittney [name], Necia D MR#:  [id number] DATE OF BIRTH:  05-04-1981  DATE OF ADMISSION:  02/18/2012 DATE OF DISCHARGE:  02/19/2012  ADMITTING DIAGNOSIS: Abdominal pain and back pain associated with bright red blood per rectum.   DISCHARGE DIAGNOSES:  1. Abdominal pain, possibly due to irritable bowel syndrome flare. Computerized tomography scan is negative for any colitis or any other infectious or inflammatory causes.  2. Bright red blood per rectum, possibly due to the internal hemorrhoids. No external hemorrhoids noted. No significant drop in hemoglobin. The patient will follow up outpatient with gastroenterology.  3. Back pain.  4. Mild anemia.  5. Abnormal pelvic lucency noted on computerized tomography scan. Magnetic resonance imaging suggested an intermediate lesion. Outpatient follow-up with repeat computerized tomography of the pelvis in 4 to 6 months.  6. History of migraine headaches.  7. History of colonic polyps.   CONSULTANT: Dr. Candace Cruise.   LABORATORY, RADIOLOGICAL AND DIAGNOSTIC DATA: Urine pregnancy was negative. Urinalysis was nitrite negative, leukocytes negative. Glucose 80, BUN 11, creatinine 1.13, sodium 140, potassium 4.2, chloride 107, CO2 27, calcium 9.1. LFTs were normal. WBC 9.1, hemoglobin 11.9, platelet count 266. EKG showed normal sinus rhythm with incomplete right bundle branch block. PA and lateral chest x-ray showed no acute cardiopulmonary processes. CT scan of the abdomen and pelvis showed no acute abdominal or pelvic pathology. There is a sclerotic lesion with a lucent halo in the left hilum. Magnetic resonance imaging of the pelvis showed indeterminate 12 mm left posterior ileal lesion. Differential considerations include both benign and malignant etiology. Recommend follow-up magnetic resonance imaging without contrast in 3 to 6 months to evaluate for stability.   HOSPITAL COURSE: Please see history and physical done by the admitting physician. The  patient is a 34 year old white female with history of irritable bowel syndrome, colonic polyp who presented to the ED with 1 to 2 day history of lower abdominal pain associated with loose stools with some burgundy color in the stools. We are asked to admit the patient for further evaluation. She underwent a CT of the abdomen which showed no acute pathology except an abnormal bone lesion in the pelvis which was indeterminate. The patient was admitted for further evaluation. She was placed on empiric antibiotics with significant improvement in symptoms within 12 hours of hospitalization. Her diet was advanced. She was feeling much better and wanted to go home. The patient did not have any significant bleeding. She was on her menstruation so it was hard to say whether it was coming from her rectum or her period. She was seen in consultation by gastroenterology. They recommended a colonoscopy if she continued to have bleeding. The patient tolerated diet well and is stable for discharge.   DISCHARGE MEDICATIONS:  1. Tylenol 650 mg every four hours p.r.n.  2. Ranitidine 150, one tab p.o. twice a day.   HOME OXYGEN: None.   DIET: Low fat, low residual for the next 2 to 3 days.   ACTIVITY: As tolerated.   FOLLOW-UP:  1. Follow-up with primary M.D., Dr. Loura Pardon, in 1 to 2 weeks. Dr. Glori Bickers needs to schedule either a CT of the pelvis or magnetic resonance imaging of the pelvis in 4 to 6 months for indeterminant pelvic lesion.  2. Follow with Dr. Candace Cruise in 3 weeks for outpatient gastrointestinal evaluation.   TIME SPENT: 40 minutes.  ____________________________ Lafonda Mosses Posey Pronto, MD shp:ap D: 02/20/2012 08:49:17 ET T: 02/21/2012 10:54:10 ET JOB#: [id number] cc: Rubye Strohmeyer H. Posey Pronto, MD, <  Dictator> Lupita Dawn. Candace Cruise, MD Wynelle Fanny Glori Bickers, MD Alric Seton MD ELECTRONICALLY SIGNED 02/24/2012 17:50

## 2014-12-07 NOTE — Consult Note (Signed)
PATIENT NAME:  Brittney [name], Brittney [name] MR#:  [id number] DATE OF BIRTH:  04/16/1981  DATE OF CONSULTATION:  02/18/2012  REFERRING PHYSICIAN:   CONSULTING PHYSICIAN:  Lupita Dawn. Candace Cruise, MD  REASON FOR REFERRAL: Rectal bleeding.   DISCUSSION: The patient is a 34 year old black female with a history of irritable bowel syndrome and known history of colon polyps who presented with at least a one day history of low abdominal cramping associated with at least four bloody stools per rectum. The patient denies any history of anemia. She has had at least three colonoscopies. The last colonoscopy was done in February in [place] where some polyps were removed. She does not know the number or the size of the polyps that were removed. The patient denies any history of diverticulosis or colitis. Because she has heme-positive stools in the Emergency Room and she was slightly anemic,  the decision was made to admit the patient for evaluation.   PAST MEDICAL HISTORY:  1. History of irritable bowel syndrome.  2. She has had polyps in the past. Her first colonoscopy revealed polyps. Repeat colonoscopy the second time did not show anything. Then the last colonoscopy again showed polyps.  3. She also has a history of migraine headaches.   PAST SURGICAL HISTORY:  1. Cholecystectomy. 2. Tonsillectomy.   MEDICATIONS: Levbid.   ALLERGIES: She has no known drug allergies.   SOCIAL HISTORY: She denies any tobacco or alcohol history.   FAMILY HISTORY: Notable for diabetes.  REVIEW OF SYSTEMS: There is no fevers or chills. No weight changes. There are no visual or hearing changes. There is no coughing or shortness of breath. There is no chest pain or palpitations. GI symptoms have been described already. There is no nausea, vomiting, heartburn, or indigestion. The rest of the review of systems is negative.   PHYSICAL EXAMINATION: GENERAL: The patient is in no acute distress.   VITAL SIGNS: She is afebrile. Vital signs  were completely normal.   HEENT: Normocephalic, atraumatic head. Pupils are equally reactive. Throat was clear.   NECK: Supple.   CARDIAC: Regular rhythm and rate without murmurs.   LUNGS: Lungs are clear bilaterally.   ABDOMEN: Normoactive bowel sounds, soft.  There may be slight tenderness in the lower abdomen bilaterally. There is no rebound or guarding. There is no hepatomegaly. There are no palpable masses.   RECTAL: Again, rectal examination showed a heme positive stool. There is no mention of any obvious hemorrhoids.   EXTREMITIES: No clubbing, cyanosis, or edema.   SKIN: Negative.   NEUROLOGICAL: Nonfocal.   LABS/STUDIES: White count 9.1, hemoglobin 11.9, platelet count 266, sodium 140, potassium 4.2, BUN 11, creatinine 1.1, glucose 80. Liver enzymes are normal. Lipase is normal. Pregnancy test was negative. Urinalysis was negative. Chest x-ray was negative. CT scan was negative except for some sclerotic lesion in the left ilium.   ASSESSMENT AND PLAN: This is a patient with abdominal pain and some lower gastrointestinal bleeding. The patient does have a history of polyps. Polyps rarely ever bleed unless they are large in size.  Patients can develop polypectomy bleeding. However, that should have started right after the colonoscopy and not six months later. It is possible the bleeding could have been related to something that she ate, although nothing was anything special. She could have had a little infectious colitis that caused bleeding. Internal hemorrhoids which cannot be seen or felt can cause bleeding as well.   We will recommend watching her the rest of the day  on a clear liquid diet. If the bleeding stops then the patient can be discharged to home. We will order some stool studies for white blood cell count, ova and parasites, and culture. If the bleeding does not stop, then we will prep the patient tomorrow afternoon for a colonoscopy on Monday.    Thank you for the  referral.  ____________________________ Lupita Dawn. Candace Cruise, MD pyo:bjt [name]: 02/19/2012 11:54:29 ET T: 02/19/2012 13:11:32 ET JOB#: [id number]  cc: Lupita Dawn. Candace Cruise, MD, <Dictator> Lupita Dawn Bluford Sedler MD ELECTRONICALLY SIGNED 02/20/2012 10:40

## 2014-12-07 NOTE — Consult Note (Signed)
Full consult to follow. 4 bloody stools starting yest afternoon. Low abd cramping. No UGI sxs. Hx of colon polyps. Last colon done several months ago. Bleeding from polyps unusual. Polypectomy bleeding not likely so far removed from colonscopy. No hemorrhoids. Infectious colitis? Check stool studies. Keep on clears today. If bleeding stops, then can discharge. However, if bleeding continues, then bowel prep tomorrow afternoon for colonoscopy Monday afternoon. Thanks  Electronic Signatures: Verdie Shire (MD)  (Signed on 06-Jul-13 10:51)  Authored  Last Updated: 06-Jul-13 10:51 by Verdie Shire (MD)

## 2014-12-07 NOTE — H&P (Signed)
PATIENT NAME:  Brittney [name], Brittney [name] MR#:  [id number] DATE OF BIRTH:  February 14, 1981  DATE OF ADMISSION:  02/18/2012  REFERRING PHYSICIAN: Marta Antu, MD    FAMILY PHYSICIAN: Loura Pardon, MD    REASON FOR ADMISSION: Abdominal and back pain associated with GI bleed.   HISTORY OF PRESENT ILLNESS: The patient is a 34 year old female with a history of irritable bowel syndrome and colonic polyps who presents to the Emergency Room with a 1 to 2-day history of lower abdominal pain described as crampy pain associated with loose stools which are burgundy in color. She denies history of anemia but was found to be anemic in the Emergency Room. She states that her last colonoscopy was in February, which did reveal some colonic polyps. She denies history of diverticulosis or diverticulitis. In the Emergency Room, the patient was in no acute distress but did have guaiac positive stools and is now admitted for further evaluation.   PAST MEDICAL/SURGICAL HISTORY:  1. Irritable bowel syndrome.  2. Colonic polyps.  3. Migraine headaches.  4. Status post cholecystectomy.  5. Status post tonsillectomy.   MEDICATIONS: Levbid 0.375 mg p.o. b.i.[name].   ALLERGIES: No known drug allergies.   SOCIAL HISTORY: The patient is married. No history of alcohol or tobacco abuse.   FAMILY HISTORY: Positive for diabetes.    REVIEW OF SYSTEMS: CONSTITUTIONAL: No fever or change in weight. EYES: No blurred or double vision. No glaucoma. ENT: No tinnitus or hearing loss. No nasal discharge or bleeding. No difficulty swallowing. RESPIRATORY: No cough or wheezing. Denies hemoptysis. No painful respiration. CARDIOVASCULAR: No chest pain or orthopnea. Denies palpitations or syncope. GI: No nausea or vomiting. GU: No dysuria or hematuria. No incontinence. ENDOCRINE: No polyuria or polydipsia. No heat or cold intolerance. HEMATOLOGIC: The patient denies anemia, easy bruising. LYMPHATIC: No swollen glands. MUSCULOSKELETAL: The patient does  have pain in her back but denies pain in her neck, shoulders, knees, or hips. No gout. NEUROLOGIC: No numbness or weakness. Denies stroke or seizures. No recent migraines. PSYCH: The patient denies anxiety, insomnia, or depression.   PHYSICAL EXAMINATION:  GENERAL: The patient is in no acute distress.   VITAL SIGNS: Vital signs are currently remarkable for a blood pressure of 113/70, with a heart rate of 69, and respiratory rate of 16. She is afebrile.   HEENT: Normocephalic, atraumatic. Pupils are equally round and reactive to light and accommodation. Extraocular movements are intact. Sclerae are anicteric. Conjunctivae are clear. Oropharynx is clear.    NECK: Supple without jugular venous distention or bruits. No adenopathy or thyromegaly is noted.   LUNGS: Clear to auscultation and percussion without wheezes, rales, or rhonchi. No dullness.   CARDIAC: Regular rate and rhythm. Normal S1, S2. No significant rubs, murmurs, or gallops. PMI is nondisplaced. Chest wall is nontender.   ABDOMEN: Soft but tender in the lower quadrants bilaterally. No rebound or guarding. Normoactive bowel sounds. No organomegaly or masses were appreciated.   RECTAL: Exam revealed guaiac-positive stool per the Emergency Room physician.   EXTREMITIES: Without clubbing, cyanosis, edema. Pulses were 2+ bilaterally.   SKIN: Warm and dry without rash or lesions.   NEUROLOGIC: Exam revealed cranial nerves II through XII are grossly intact. Deep tendon reflexes were symmetric. Motor and sensory exam is nonfocal.   PSYCHIATRIC: Exam revealed a patient who was alert and oriented to person, place, and time. She was cooperative and used good judgment.   LABORATORY, DIAGNOSTIC AND RADIOLOGICAL DATA:  White count was 9.1, with a  hemoglobin of 11.9, and a platelet count of 266,000.  Glucose was 80 with a BUN of 11, a creatinine 1.13, with a sodium of 140 and a potassium of 4.2.  ALT was 22 with an AST of 19. Bilirubin 0.2.  Lipase 170. GFR greater than 60.  Urinalysis negative.  Pregnancy test negative.  Pro time was 12.8 with an INR of 0.9.  Chest x-ray revealed no acute disease.  CT of the abdomen and pelvis with contrast revealed no acute abdominal or pelvic pathology although there was a sclerotic lesion noted in the left ilium.   ASSESSMENT:  1. Abdominal pain.  2. Back pain.  3. Gastrointestinal bleed.  4. Anemia.  5. Pelvic lucency noted on CT.  6. Migraine headaches.  7. History of colonic polyps.   PLAN: The patient will be admitted to the floor on a clear liquid diet with IV fluids. We will guaiac all stools and follow her hemoglobin closely. We will consult GI in regards to her GI bleed and abdominal complaints. We will begin empiric Cipro and Flagyl. We will also go ahead and proceed with MRI of the pelvis to further evaluate the sclerotic lesion in the left ilium. Further treatment and evaluation will depend upon the patient's progress.  TOTAL TIME SPENT: 50 minutes.   ____________________________ Leonie Douglas Doy Hutching, MD jds:cbb [name]: 02/18/2012 00:29:12 ET T: 02/18/2012 10:56:08 ET JOB#: [id number] cc: Leonie Douglas. Doy Hutching, MD, <Dictator> Marne A. Glori Bickers, MD Jolanda Mccann Lennice Sites MD ELECTRONICALLY SIGNED 02/19/2012 21:29

## 2014-12-07 NOTE — Consult Note (Signed)
Chief Complaint:   Subjective/Chief Complaint No bleeding yest. Bleeding today but patient started menses as well. Blood today different color than burgundy stools she was having at home. Stool sent this AM. Reg diet ordered.   VITAL SIGNS/ANCILLARY NOTES: **Vital Signs.:   07-Jul-13 06:02   Vital Signs Type Q 4hr   Temperature Temperature (F) 98   Celsius 36.6   Temperature Source Oral   Pulse Pulse 64   Respirations Respirations 18   Systolic BP Systolic BP 947   Diastolic BP (mmHg) Diastolic BP (mmHg) 71   Mean BP 83   BP Source  if not from Vital Sign Device non-invasive   Pulse Ox % Pulse Ox % 99   Pulse Ox Activity Level  At rest   Oxygen Delivery Room Air/ 21 %   Brief Assessment:   Cardiac Regular    Respiratory clear BS    Gastrointestinal Normal   Lab Results: Routine Chem:  07-Jul-13 04:55    Glucose, Serum 90   BUN  5   Creatinine (comp) 0.88   Sodium, Serum 139   Potassium, Serum 4.1   Chloride, Serum 106   CO2, Serum 25   Calcium (Total), Serum  8.3   Anion Gap 8   Osmolality (calc) 274   eGFR (African American) >60   eGFR (Non-African American) >60 (eGFR values <74m/min/1.73 m2 may be an indication of chronic kidney disease (CKD). Calculated eGFR is useful in patients with stable renal function. The eGFR calculation will not be reliable in acutely ill patients when serum creatinine is changing rapidly. It is not useful in  patients on dialysis. The eGFR calculation may not be applicable to patients at the low and high extremes of body sizes, pregnant women, and vegetarians.)  Routine Hem:  07-Jul-13 04:55    WBC (CBC) 6.2   RBC (CBC) 4.41   Hemoglobin (CBC)  11.4   Hematocrit (CBC) 35.8   Platelet Count (CBC) 243   MCV 81   MCH  25.9   MCHC  31.9   RDW  17.0   Neutrophil % 69.9   Lymphocyte % 21.2   Monocyte % 7.7   Eosinophil % 0.9   Basophil % 0.3   Neutrophil # 4.3   Lymphocyte # 1.3   Monocyte # 0.5   Eosinophil # 0.1    Basophil # 0.0 (Result(s) reported on 19 Feb 2012 at 07:10AM.)   Assessment/Plan:  Assessment/Plan:   Assessment Rectal bleeding. Could be infectious. Internal hemorrhoidal bleeding also possible. Pt stable.    Plan If tolerates solids without gross rectal bleeding,; then patient can be discharged today on cipro for few more days. Pt can f/u with [place]. Thanks.   Electronic Signatures: OVerdie Shire(MD)  (Signed 07-Jul-13 11:49)  Authored: Chief Complaint, VITAL SIGNS/ANCILLARY NOTES, Brief Assessment, Lab Results, Assessment/Plan   Last Updated: 07-Jul-13 11:49 by OVerdie Shire(MD)

## 2015-03-20 ENCOUNTER — Encounter: Payer: Self-pay | Admitting: Gastroenterology

## 2015-04-21 ENCOUNTER — Other Ambulatory Visit: Payer: Self-pay | Admitting: Unknown Physician Specialty

## 2015-04-21 ENCOUNTER — Ambulatory Visit
Admission: RE | Admit: 2015-04-21 | Discharge: 2015-04-21 | Disposition: A | Discharge status: Home / Self Care | Payer: BC Managed Care – PPO | Source: Ambulatory Visit | Attending: Unknown Physician Specialty | Admitting: Unknown Physician Specialty

## 2015-04-21 DIAGNOSIS — I8002 Phlebitis and thrombophlebitis of superficial vessels of left lower extremity: Secondary | ICD-10-CM | POA: Diagnosis present

## 2015-04-29 ENCOUNTER — Emergency Department (HOSPITAL_COMMUNITY)
Admission: EM | Admit: 2015-04-29 | Discharge: 2015-04-29 | Disposition: A | Discharge status: Home / Self Care | Payer: BC Managed Care – PPO | Attending: Emergency Medicine | Admitting: Emergency Medicine

## 2015-04-29 ENCOUNTER — Encounter (HOSPITAL_COMMUNITY): Payer: Self-pay | Admitting: Neurology

## 2015-04-29 ENCOUNTER — Emergency Department (HOSPITAL_COMMUNITY): Payer: BC Managed Care – PPO

## 2015-04-29 DIAGNOSIS — K625 Hemorrhage of anus and rectum: Secondary | ICD-10-CM | POA: Diagnosis present

## 2015-04-29 DIAGNOSIS — K589 Irritable bowel syndrome without diarrhea: Secondary | ICD-10-CM | POA: Insufficient documentation

## 2015-04-29 DIAGNOSIS — E669 Obesity, unspecified: Secondary | ICD-10-CM | POA: Insufficient documentation

## 2015-04-29 DIAGNOSIS — Z86018 Personal history of other benign neoplasm: Secondary | ICD-10-CM | POA: Diagnosis not present

## 2015-04-29 DIAGNOSIS — Z862 Personal history of diseases of the blood and blood-forming organs and certain disorders involving the immune mechanism: Secondary | ICD-10-CM | POA: Diagnosis not present

## 2015-04-29 DIAGNOSIS — K644 Residual hemorrhoidal skin tags: Secondary | ICD-10-CM | POA: Diagnosis not present

## 2015-04-29 DIAGNOSIS — Z8679 Personal history of other diseases of the circulatory system: Secondary | ICD-10-CM | POA: Diagnosis not present

## 2015-04-29 LAB — COMPREHENSIVE METABOLIC PANEL
ALBUMIN: 3.8 g/dL (ref 3.5–5.0)
ALK PHOS: 74 U/L (ref 38–126)
ALT: 14 U/L (ref 14–54)
AST: 18 U/L (ref 15–41)
Anion gap: 8 (ref 5–15)
BUN: 8 mg/dL (ref 6–20)
CALCIUM: 9.4 mg/dL (ref 8.9–10.3)
CHLORIDE: 107 mmol/L (ref 101–111)
CO2: 23 mmol/L (ref 22–32)
CREATININE: 1.02 mg/dL — AB (ref 0.44–1.00)
GFR calc Af Amer: >60 mL/min (ref >60)
GFR calc non Af Amer: >60 mL/min (ref >60)
GLUCOSE: 85 mg/dL (ref 65–99)
Potassium: 4.3 mmol/L (ref 3.5–5.1)
SODIUM: 138 mmol/L (ref 135–145)
Total Bilirubin: 0.5 mg/dL (ref 0.3–1.2)
Total Protein: 7.3 g/dL (ref 6.5–8.1)

## 2015-04-29 LAB — CBC
HCT: 33.9 % — ABNORMAL LOW (ref 36.0–46.0)
Hemoglobin: 10.9 g/dL — ABNORMAL LOW (ref 12.0–15.0)
MCH: 24.4 pg — AB (ref 26.0–34.0)
MCHC: 32.2 g/dL (ref 30.0–36.0)
MCV: 75.8 fL — AB (ref 78.0–100.0)
PLATELETS: 280 10*3/uL (ref 150–400)
RBC: 4.47 MIL/uL (ref 3.87–5.11)
RDW: 17.1 % — ABNORMAL HIGH (ref 11.5–15.5)
WBC: 8.2 10*3/uL (ref 4.0–10.5)

## 2015-04-29 LAB — I-STAT BETA HCG BLOOD, ED (MC, WL, AP ONLY): I-stat hCG, quantitative: <5 m[IU]/mL (ref <5)

## 2015-04-29 LAB — POC OCCULT BLOOD, ED: FECAL OCCULT BLD: NEGATIVE

## 2015-04-29 LAB — LIPASE, BLOOD: LIPASE: 26 U/L (ref 22–51)

## 2015-04-29 NOTE — ED Notes (Signed)
Pt has IBS, today at 1415 had diarrhea with blood present and was dark, almost black. C/o left sided abd cramping, diarrhea x 4.

## 2015-04-29 NOTE — ED Provider Notes (Signed)
CSN: [id number]     Arrival date & time 04/29/15  1644 History   First MD Initiated Contact with Patient 04/29/15 1940     Chief Complaint  Patient presents with  . GI Bleeding     (Consider location/radiation/quality/duration/timing/severity/associated sxs/prior Treatment) Patient is a 34 y.o. female presenting with hematochezia.  Rectal Bleeding Quality:  Black and tarry and bright red Amount:  Moderate Duration:  1 day Timing:  Constant Progression:  Unchanged Chronicity:  New Context: not constipation   Context comment:  IBS Similar prior episodes: no   Relieved by:  None tried Worsened by:  Defecation Ineffective treatments:  None tried Associated symptoms: abdominal pain   Associated symptoms: no fever and no vomiting   Risk factors: no anticoagulant use and no NSAID use     Past Medical History  Diagnosis Date  . Irritable bowel syndrome   . Obesity, unspecified   . Fibroids   . Migraine   . Anemia    Past Surgical History  Procedure Laterality Date  . Colonoscopy  11/2001    polyp; negative pathology  . Colonoscopy  09/2004    Negative  . Uterine US  09/2005    Large endometrial stripe; small ovarian cyst  . Laparoscopic decortication / dubulking / ablation renal cysts      saline histogram  . Tonsillectomy  07/30/07  . Esophagogastroduodenoscopy  1/10    normal  . Colonoscopy w/ polypectomy    . Cholecystectomy  05/06/11  . Flexible sigmoidoscopy  03/05/2012    Procedure: FLEXIBLE SIGMOIDOSCOPY;  Surgeon: Inda Castle, MD;  Location: [facility];  Service: Endoscopy;  Laterality: N/A;   Family History  Problem Relation Age of Onset  . Ulcers Brother   . Clotting disorder Brother   . Colon cancer Maternal Grandmother   . Ovarian cancer Maternal Grandmother   . Diabetes Paternal Grandmother   . Hypertension Paternal Grandmother   . Diabetes Paternal Aunt   . Heart disease Father   . Hypertension Father   . Heart attack Father   . Breast cancer  Maternal Aunt     Age 88's  . Diabetes Paternal Aunt     x 3  . Lung cancer Maternal Grandfather     SMOKER   Social History  Substance Use Topics  . Smoking status: Never Smoker   . Smokeless tobacco: Never Used  . Alcohol Use: No     Comment: rare   OB History    Gravida Para Term Preterm AB TAB SAB Ectopic Multiple Living   2 2 2  0 0 0 0 0 0 2     Review of Systems  Constitutional: Negative for fever and chills.  HENT: Negative for congestion, ear pain and sore throat.   Eyes: Negative for visual disturbance.  Respiratory: Negative for cough, shortness of breath and wheezing.   Cardiovascular: Negative for chest pain.  Gastrointestinal: Positive for abdominal pain, blood in stool and hematochezia. Negative for nausea, vomiting, diarrhea and constipation.  Genitourinary: Negative for dysuria, difficulty urinating and vaginal pain.  Musculoskeletal: Negative for myalgias and arthralgias.  Skin: Negative for rash.  Neurological: Negative for syncope and headaches.  Psychiatric/Behavioral: Negative for behavioral problems.  All other systems reviewed and are negative.     Allergies  Lansoprazole  Home Medications   Prior to Admission medications   Medication Sig Start Date End Date Taking? Authorizing Provider  alosetron (LOTRONEX) 0.5 MG tablet Take 0.5 mg by mouth 2 (two) times daily.  04/03/15 04/02/16 Yes Historical Provider, MD  nitroGLYCERIN (NITROSTAT) 0.4 MG SL tablet Place 1 tablet (0.4 mg total) under the tongue every 5 (five) minutes as needed for chest pain. 07/03/14  Yes Wellington Hampshire, MD   BP 125/62 mmHg  Pulse 71  Temp(Src) 97.7 F (36.5 C) (Oral)  Resp 16  SpO2 100%  LMP 04/18/2015 Physical Exam  Constitutional: She is oriented to person, place, and time. She appears well-developed and well-nourished. No distress.  HENT:  Head: Normocephalic and atraumatic.  Eyes: EOM are normal.  Neck: Normal range of motion.  Cardiovascular: Normal rate,  regular rhythm and normal heart sounds.   No murmur heard. Pulmonary/Chest: Effort normal and breath sounds normal. No respiratory distress. She has no wheezes.  Abdominal: Soft. Bowel sounds are normal. She exhibits no distension. There is no tenderness.  Genitourinary: Rectal exam shows external hemorrhoid. Rectal exam shows no tenderness.  Musculoskeletal: She exhibits no edema.  Neurological: She is alert and oriented to person, place, and time.  Skin: She is not diaphoretic.  Psychiatric: She has a normal mood and affect. Her behavior is normal.    ED Course  Procedures (including critical care time) Labs Review Labs Reviewed  COMPREHENSIVE METABOLIC PANEL - Abnormal; Notable for the following:    Creatinine, Ser 1.02 (*)    All other components within normal limits  CBC - Abnormal; Notable for the following:    Hemoglobin 10.9 (*)    HCT 33.9 (*)    MCV 75.8 (*)    MCH 24.4 (*)    RDW 17.1 (*)    All other components within normal limits  LIPASE, BLOOD  I-STAT BETA HCG BLOOD, ED ([facility], [facility], [facility] ONLY)  POC OCCULT BLOOD, ED    Imaging Review Dg Abd 1 View  04/29/2015   CLINICAL DATA:  34 year old female with abdominal pain and dark black bowel movements.  EXAM: ABDOMEN - 1 VIEW  COMPARISON:  Pelvic MRI dated 02/18/2012 and CT dated 02/17/2012  FINDINGS: The bowel gas pattern is normal. No radio-opaque calculi or other significant radiographic abnormality are seen. Right upper quadrant cholecystectomy clips.  IMPRESSION: Negative.   Electronically Signed   By: Anner Crete M.D.   On: 04/29/2015 21:25   I have personally reviewed and evaluated these images and lab results as part of my medical decision-making.   EKG Interpretation None      MDM   Patient is a 67 roll female with a history of IBS, anemia secondary to heavy vaginal bleeding, hemorrhoids that presents with black tarry stool for 1 day and blood on toilet paper with wiping today. Patient states she has had  intermittent crampy abdominal pain for the past week worsening left lower quadrant. On arrival to the ED the patient is afebrile so vital signs. Patient denies any shortness of breath, near-syncope, chest pain. On exam patient has an external hemorrhoid with no rectal tenderness on DRE, stool is brown without evidence of melena or hematochezia. Patient has no pallor on exam. Patient's hemoglobin is stable with a microcytic anemia and patient states that she is noncompliant with her iron supplementation. Patient's Hemoccult is negative. Given the reassuring exam and lab work is felt the patient stable for discharge at this time.  Final diagnoses:  Hemorrhoids, external         Renne Musca, MD 04/29/15 0814  Blanchie Dessert, MD 04/30/15 1525

## 2015-04-29 NOTE — Discharge Instructions (Signed)

## 2015-04-29 NOTE — ED Notes (Signed)
Pt in room on her computer and cellphone.

## 2015-04-29 NOTE — ED Notes (Signed)
Patient transported to X-ray 

## 2015-08-19 ENCOUNTER — Encounter: Payer: Self-pay | Admitting: Family Medicine

## 2015-08-19 ENCOUNTER — Ambulatory Visit (INDEPENDENT_AMBULATORY_CARE_PROVIDER_SITE_OTHER): Payer: BC Managed Care – PPO | Admitting: Family Medicine

## 2015-08-19 VITALS — BP 122/74 | HR 70 | Temp 98.2°F | Ht 68.0 in | Wt 273.5 lb

## 2015-08-19 DIAGNOSIS — B379 Candidiasis, unspecified: Secondary | ICD-10-CM

## 2015-08-19 MED ORDER — FLUCONAZOLE 150 MG PO TABS: 150.0000 mg | ORAL_TABLET | Freq: Once | ORAL | Status: DC

## 2015-08-19 NOTE — Patient Instructions (Signed)
We will call with the results.  I am treating you for a yeast infection while awaiting the results.  Take care  Dr. Lacinda Axon

## 2015-08-19 NOTE — Progress Notes (Signed)
Subjective:  Patient ID: Brittney [name], female    DOB: 1981/03/20  Age: 35 y.o. MRN: [id number]  CC: Vaginal discharge  HPI:  35 year old female presents to clinic today for acute visit with complaints of vaginal discharge.  Vaginal discharge  Patient reports that she developed vaginal discharge yesterday.  She reports associated itching.  She denies any odor.  No known exacerbating or relieving factors.  No interventions tried.  No associated vaginal bleeding.  Patient has had recent intercourse.  She is not concerned about STDs and does not desire testing today.  Social Hx   Social History   Social History  . Marital Status: Married    Spouse Name: N/A  . Number of Children: 2  . Years of Education: N/A   Occupational History  . Teacher    Social History Main Topics  . Smoking status: Never Smoker   . Smokeless tobacco: Never Used  . Alcohol Use: No     Comment: rare  . Drug Use: No  . Sexual Activity: Yes    Birth Control/ Protection: Condom, IUD   Other Topics Concern  . 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 Use-soda         Review of Systems  Constitutional: Negative.   Genitourinary: Positive for vaginal discharge. Negative for dysuria.   Objective:  BP 122/74 mmHg  Pulse 70  Temp(Src) 98.2 F (36.8 C) (Oral)  Ht 5\' 8"  (1.727 m)  Wt 273 lb 8 oz (124.059 kg)  BMI 41.60 kg/m2  SpO2 99%  BP/Weight 08/19/2015 04/29/2015 [id number]  Systolic BP 123XX123 99 [id number]  Diastolic BP 74 59 68  Wt. (Lbs) 273.5 - 263  BMI 41.6 - 40   Physical Exam  Constitutional: She is oriented to person, place, and time. She appears well-developed. No distress.  Eyes: Conjunctivae are normal. No scleral icterus.  Pulmonary/Chest: Effort normal.  Genitourinary:  Pelvic exam: Inspection - visible white discharge noted from the vaginal introitus. Odor noted. Speculum exam not performed today given the  amount of vaginal discharge at the introitus.  Neurological: She is alert and oriented to person, place, and time.  Psychiatric: She has a normal mood and affect.  Vitals reviewed.   Lab Results  Component Value Date   WBC 8.2 04/29/2015   HGB 10.9* 04/29/2015   HCT 33.9* 04/29/2015   PLT 280 04/29/2015   GLUCOSE 85 04/29/2015   CHOL 150 08/29/2014   TRIG 38.0 08/29/2014   HDL 39.20 08/29/2014   LDLCALC 103* 08/29/2014   ALT 14 04/29/2015   AST 18 04/29/2015   NA 138 04/29/2015   K 4.3 04/29/2015   CL 107 04/29/2015   CREATININE 1.02* 04/29/2015   BUN 8 04/29/2015   CO2 23 04/29/2015   TSH 1.31 08/29/2014   INR 1.0 07/05/2013   Assessment & Plan:   Problem List Items Addressed This Visit    Yeast infection - Primary    New problem. History and exam consistent with yeast infection. Treating empirically with Diflucan while awaiting wet prep swab.      Relevant Medications   fluconazole (DIFLUCAN) 150 MG tablet   Other Relevant Orders   WET PREP FOR TRICH, YEAST, CLUE      Meds ordered this encounter  Medications  . fluconazole (DIFLUCAN) 150 MG tablet    Sig: Take 1 tablet (150 mg total)  by mouth once. Repeat x 1 in 72 hours.    Dispense:  2 tablet    Refill:  2    Follow-up: PRN  Cayce

## 2015-08-19 NOTE — Progress Notes (Signed)
Pre visit review using our clinic review tool, if applicable. No additional management support is needed unless otherwise documented below in the visit note. 

## 2015-08-19 NOTE — Assessment & Plan Note (Signed)
New problem. History and exam consistent with yeast infection. Treating empirically with Diflucan while awaiting wet prep swab.

## 2015-08-20 ENCOUNTER — Telehealth: Payer: Self-pay | Admitting: Family Medicine

## 2015-08-20 NOTE — Telephone Encounter (Signed)
Please advise 

## 2015-08-20 NOTE — Telephone Encounter (Signed)
Heather [phone] [id number] option 3 for micro called from [facility] regarding incorrect lab order. She needs the correct lab order called in verbally. Thank You!

## 2015-08-21 LAB — WET PREP BY MOLECULAR PROBE
CANDIDA SPECIES: POSITIVE — AB
Gardnerella vaginalis: NEGATIVE
TRICHOMONAS VAG: NEGATIVE

## 2015-10-22 ENCOUNTER — Ambulatory Visit (INDEPENDENT_AMBULATORY_CARE_PROVIDER_SITE_OTHER)
Admission: RE | Admit: 2015-10-22 | Discharge: 2015-10-22 | Disposition: A | Discharge status: Home / Self Care | Payer: BC Managed Care – PPO | Source: Ambulatory Visit | Attending: Internal Medicine | Admitting: Internal Medicine

## 2015-10-22 ENCOUNTER — Ambulatory Visit (INDEPENDENT_AMBULATORY_CARE_PROVIDER_SITE_OTHER): Payer: BC Managed Care – PPO | Admitting: Internal Medicine

## 2015-10-22 ENCOUNTER — Encounter: Payer: Self-pay | Admitting: Internal Medicine

## 2015-10-22 VITALS — BP 102/76 | HR 70 | Temp 98.4°F | Wt 280.8 lb

## 2015-10-22 DIAGNOSIS — M25531 Pain in right wrist: Secondary | ICD-10-CM

## 2015-10-22 NOTE — Patient Instructions (Signed)
Please let me know if the pain is not better next week--we will set up an evaluation with a neurologist.

## 2015-10-22 NOTE — Progress Notes (Signed)
Pre visit review using our clinic review tool, if applicable. No additional management support is needed unless otherwise documented below in the visit note. 

## 2015-10-22 NOTE — Progress Notes (Signed)
Subjective:    Patient ID: Brittney [name], female    DOB: [birth year], 35 y.o.   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 alosetron (LOTRONEX) 0.5 MG tablet Take 0.5 mg by mouth 2 (two) times daily.    . nitroGLYCERIN (NITROSTAT) 0.4 MG SL tablet Place 1 tablet (0.4 mg total) under the tongue every 5 (five) minutes as needed for chest pain. 25 tablet 6   No current facility-administered medications on file prior to visit.    Allergies  Allergen Reactions  . Lansoprazole     REACTION: rash    Past Medical History  Diagnosis Date  . Irritable bowel syndrome   . Obesity, unspecified   . Fibroids   . Migraine   . Anemia     Past Surgical History  Procedure Laterality Date  . Colonoscopy  11/2001    polyp; negative pathology  . Colonoscopy  09/2004    Negative  . Uterine US  09/2005    Large endometrial stripe; small ovarian cyst  . Laparoscopic decortication / dubulking / ablation renal cysts      saline histogram  . Tonsillectomy  07/30/07  . Esophagogastroduodenoscopy  1/10    normal  . Colonoscopy w/ polypectomy    . Cholecystectomy  05/06/11  . Flexible sigmoidoscopy  03/05/2012    Procedure: FLEXIBLE SIGMOIDOSCOPY;  Surgeon: Inda Castle, MD;  Location: [facility];  Service: Endoscopy;  Laterality: N/A;    Family History  Problem Relation Age of Onset  . Ulcers Brother   . Clotting disorder Brother   . Colon cancer Maternal Grandmother   . Ovarian cancer Maternal Grandmother   . Diabetes Paternal Grandmother   . Hypertension Paternal Grandmother   . Diabetes Paternal Aunt   . Heart disease Father   .  Hypertension Father   . Heart attack Father   . Breast cancer Maternal Aunt     Age 34's  . Diabetes Paternal Aunt     x 3  . Lung cancer Maternal Grandfather     SMOKER    Social History   Social History  . Marital Status: Married    Spouse Name: N/A  . Number of Children: 2  . Years of Education: N/A   Occupational History  . Teacher    Social History Main Topics  . Smoking status: Never Smoker   . Smokeless tobacco: Never Used  . Alcohol Use: No     Comment: rare  . Drug Use: No  . Sexual Activity: Yes    Birth Control/ Protection: Condom, IUD   Other Topics Concern  . 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 Use-soda         Review of Systems No neck injuries or spine problems Sleeps okay. No arm symptoms specifically at night--may have same pain as day    Objective:   Physical Exam  Constitutional: She appears well-developed and well-nourished. No distress.  Neck: Normal range of motion. Neck supple.  No masses  Musculoskeletal:  No joint swelling in right hand Normal ROM  Normal strength No tenderness at volar wrist--but that is where the pain is  No findings along right biceps where her arm pain is          Assessment & Plan:

## 2015-10-22 NOTE — Assessment & Plan Note (Signed)
And right arm pain Doesn't seem joint related but will check wrist x-ray ?radicular pain? Is along T1 or C8 dermatome---but no weakness, etc Ibuprofen no help  If x-ray is normal--and not better next week, will set up with neurologist

## 2015-10-29 ENCOUNTER — Emergency Department: Payer: BC Managed Care – PPO

## 2015-10-29 ENCOUNTER — Encounter: Payer: Self-pay | Admitting: Emergency Medicine

## 2015-10-29 ENCOUNTER — Emergency Department
Admission: EM | Admit: 2015-10-29 | Discharge: 2015-10-29 | Disposition: A | Discharge status: Home / Self Care | Payer: BC Managed Care – PPO | Attending: Emergency Medicine | Admitting: Emergency Medicine

## 2015-10-29 ENCOUNTER — Telehealth: Payer: Self-pay | Admitting: Family Medicine

## 2015-10-29 DIAGNOSIS — Z79899 Other long term (current) drug therapy: Secondary | ICD-10-CM | POA: Insufficient documentation

## 2015-10-29 DIAGNOSIS — M62830 Muscle spasm of back: Secondary | ICD-10-CM | POA: Diagnosis not present

## 2015-10-29 DIAGNOSIS — R079 Chest pain, unspecified: Secondary | ICD-10-CM | POA: Insufficient documentation

## 2015-10-29 DIAGNOSIS — M545 Low back pain: Secondary | ICD-10-CM | POA: Insufficient documentation

## 2015-10-29 DIAGNOSIS — M62838 Other muscle spasm: Secondary | ICD-10-CM

## 2015-10-29 HISTORY — DX: Angina pectoris with documented spasm: I20.1

## 2015-10-29 LAB — CBC
HCT: 34.4 % — ABNORMAL LOW (ref 35.0–47.0)
HEMOGLOBIN: 11.2 g/dL — AB (ref 12.0–16.0)
MCH: 24.5 pg — AB (ref 26.0–34.0)
MCHC: 32.4 g/dL (ref 32.0–36.0)
MCV: 75.6 fL — ABNORMAL LOW (ref 80.0–100.0)
PLATELETS: 289 10*3/uL (ref 150–440)
RBC: 4.55 MIL/uL (ref 3.80–5.20)
RDW: 18.3 % — AB (ref 11.5–14.5)
WBC: 7.1 10*3/uL (ref 3.6–11.0)

## 2015-10-29 LAB — BASIC METABOLIC PANEL
Anion gap: 5 (ref 5–15)
BUN: 13 mg/dL (ref 6–20)
CALCIUM: 8.9 mg/dL (ref 8.9–10.3)
CO2: 24 mmol/L (ref 22–32)
CREATININE: 0.89 mg/dL (ref 0.44–1.00)
Chloride: 107 mmol/L (ref 101–111)
GFR calc Af Amer: >60 mL/min (ref >60)
GFR calc non Af Amer: >60 mL/min (ref >60)
GLUCOSE: 85 mg/dL (ref 65–99)
Potassium: 3.9 mmol/L (ref 3.5–5.1)
Sodium: 136 mmol/L (ref 135–145)

## 2015-10-29 LAB — TROPONIN I

## 2015-10-29 MED ORDER — CYCLOBENZAPRINE HCL 10 MG PO TABS: 10.0000 mg | ORAL_TABLET | Freq: Three times a day (TID) | ORAL | Status: DC | PRN

## 2015-10-29 NOTE — ED Provider Notes (Signed)
[facility] Emergency Department Provider Note   ____________________________________________  Time seen: Approximately 1:30 PM I have reviewed the triage vital signs and the triage nursing note.  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 Pain is worse with movement of her trunk as well as real deep breaths. She does not report any new repetitive activities, overuse, or trauma, however she states that she did do a lot of cooking in a classroom yesterday which was very unusual for her.  No shortness of breath, no palpations. No dizziness or passing out, no fevers. No numbness or tingling. She does not take any birth control or other hormones.    Past Medical History  Diagnosis Date  . Irritable bowel syndrome   . Obesity, unspecified   . Fibroids   . Migraine   . Anemia   . Coronary artery spasm [facility])     Patient Active Problem List   Diagnosis Date Noted  . Right wrist pain 10/22/2015  . Yeast infection 08/19/2015  . Hemorrhoids, external 09/30/2014  . Chronic diarrhea 08/29/2014  . 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[place]) 07/04/2014  . Hemorrhoids, internal, with bleeding 04/22/2014  . Family history of pulmonary embolism 09/02/2013  . Family history of diabetes mellitus 09/02/2013  . Chest pain 07/09/2013  . Cutaneous skin tags 05/15/2013  . Sebaceous cyst of left axilla 05/15/2013  . DUB (dysfunctional uterine bleeding) 09/24/2012  . Endometritis 09/24/2012  . 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 EDEMA 11/04/2009  . OBESITY 03/31/2008  . HEARING LOSS, MILD 03/31/2008  . MIGRAINE HEADACHE 02/20/2007  . IBS 02/20/2007  . OSTEOARTHRITIS 02/20/2007    Past  Surgical History  Procedure Laterality Date  . Colonoscopy  11/2001    polyp; negative pathology  . Colonoscopy  09/2004    Negative  . Uterine US  09/2005    Large endometrial stripe; small ovarian cyst  . Laparoscopic decortication / dubulking / ablation renal cysts      saline histogram  . Tonsillectomy  07/30/07  . Esophagogastroduodenoscopy  1/10    normal  . Colonoscopy w/ polypectomy    . Cholecystectomy  05/06/11  . Flexible sigmoidoscopy  03/05/2012    Procedure: FLEXIBLE SIGMOIDOSCOPY;  Surgeon: Inda Castle, MD;  Location: [facility];  Service: Endoscopy;  Laterality: N/A;    Current Outpatient Rx  Name  Route  Sig  Dispense  Refill  . alosetron (LOTRONEX) 0.5 MG tablet   Oral   Take 0.5 mg by mouth 2 (two) times daily.         . cyclobenzaprine (FLEXERIL) 10 MG tablet   Oral   Take 1 tablet (10 mg total) by mouth every 8 (eight) hours as needed for muscle spasms.   20 tablet   0   . nitroGLYCERIN (NITROSTAT) 0.4 MG SL tablet   Sublingual   Place 1 tablet (0.4 mg total) under the tongue every 5 (five) minutes as needed for chest pain.   25 tablet   6     Allergies Lansoprazole  Family History  Problem Relation Age of Onset  . Ulcers Brother   . Clotting disorder Brother   . Colon cancer Maternal Grandmother   .  Ovarian cancer Maternal Grandmother   . Diabetes Paternal Grandmother   . Hypertension Paternal Grandmother   . Diabetes Paternal Aunt   . Heart disease Father   . Hypertension Father   . Heart attack Father   . Breast cancer Maternal Aunt     Age 67's  . Diabetes Paternal Aunt     x 3  . Lung cancer Maternal Grandfather     SMOKER    Social History Social History  Substance Use Topics  . Smoking status: Never Smoker   . Smokeless tobacco: Never Used  . Alcohol Use: No     Comment: rare    Review of Systems  Constitutional: Negative for fever. Eyes: Negative for visual changes. ENT: Negative for sore  throat. Cardiovascular: Negative for palpitations. Respiratory: Negative for shortness of breath. Gastrointestinal: Negative for abdominal pain, vomiting and diarrhea. Genitourinary: Negative for dysuria. Musculoskeletal: Negative for back pain. Skin: Negative for rash. Neurological: Negative for headache. 10 point Review of Systems otherwise negative ____________________________________________   PHYSICAL EXAM:  VITAL SIGNS: ED Triage Vitals  Enc Vitals Group     BP 10/29/15 1106 128/67 mmHg     Pulse Rate 10/29/15 1106 73     Resp 10/29/15 1106 18     Temp 10/29/15 1106 98.5 F (36.9 C)     Temp Source 10/29/15 1106 Oral     SpO2 10/29/15 1311 100 %     Weight 10/29/15 1106 270 lb (122.471 kg)     Height 10/29/15 1106 5\' 8"  (1.727 m)     Head Cir --      Peak Flow --      Pain Score 10/29/15 1104 10     Pain Loc --      Pain Edu? --      Excl. in [place]? --      Constitutional: Alert and oriented. Well appearing and in no distress. HEENT   Head: Normocephalic and atraumatic.      Eyes: Conjunctivae are normal. PERRL. Normal extraocular movements.      Ears:         Nose: No congestion/rhinnorhea.   Mouth/Throat: Mucous membranes are moist.   Neck: No stridor. Cardiovascular/Chest: Normal rate, regular rhythm.  No murmurs, rubs, or gallops. Respiratory: Normal respiratory effort without tachypnea nor retractions. Breath sounds are clear and equal bilaterally. No wheezes/rales/rhonchi. Gastrointestinal: Soft. No distention, no guarding, no rebound. Nontender.  Obese  Genitourinary/rectal:Deferred Musculoskeletal: Palpable and tender muscle spasm right trapezius between the spine and the scapula. Nontender with normal range of motion in all extremities. No joint effusions.  No lower extremity tenderness.  No edema. Neurologic:  Normal speech and language. No gross or focal neurologic deficits are appreciated. Skin:  Skin is warm, dry and intact. No rash  noted. Psychiatric: Mood and affect are normal. Speech and behavior are normal. Patient exhibits appropriate insight and judgment.  ____________________________________________   EKG I, Lisa Roca, MD, the attending physician have personally viewed and interpreted all ECGs.  76 bpm. Normal sinus rhythm.  Respiratory normal axis. Normal ST and T-wave ____________________________________________  LABS (pertinent positives/negatives)  Basic metabolic panel without significant abnormalities CBC significant for hemoglobin 11.2 Troponin less than 0.03  ____________________________________________  RADIOLOGY All Xrays were viewed by me. Imaging interpreted by Radiologist.  Chest: No edema or consolidation __________________________________________  PROCEDURES  Procedure(s) performed: None  Critical Care performed: None  ____________________________________________   ED COURSE / ASSESSMENT AND PLAN  Pertinent labs & imaging results that were  available during my care of the patient were reviewed by me and considered in my medical decision making (see chart for details).   Patient's symptoms are quite atypical for ACS. She denies symptoms related to acid reflux asked GERD. She reports that she is under a normal amount of stress, but typically handle stress well and does not feel like this is stress related. I do not suspect PE, no fever, no hypoxia, no tachycardia, and she has an alternative diagnosis which is a very palpable and tender muscle spasm in the right back.  We discussed conservative treatment with heat, massage, muscle relaxer and anti-inflammatory.    CONSULTATIONS:   None   Patient / Family / Caregiver informed of clinical course, medical decision-making process, and agree with plan.   I discussed return precautions, follow-up instructions, and discharged instructions with patient and/or family.   ___________________________________________   FINAL  CLINICAL IMPRESSION(S) / ED DIAGNOSES   Final diagnoses:  Trapezius muscle spasm  Nonspecific chest pain              Note: This dictation was prepared with Dragon dictation. Any transcriptional errors that result from this process are unintentional   Lisa Roca, MD 10/29/15 1356

## 2015-10-29 NOTE — Telephone Encounter (Signed)
Agree with advisement to go to UC or ED now

## 2015-10-29 NOTE — ED Notes (Signed)
C/O Right sided Chest pain with pain radiating through right upper back.  Pain worse with movement and taking deep breaths.  Onset of symptoms this morning at 0830.  Denies SOB/ DOE.

## 2015-10-29 NOTE — Telephone Encounter (Signed)
Patient Name: Brittney [name] DOB: 04-17-1981 Initial Comment Caller states that the patient has discomfort on the right side sternum to the back side, lungs not moving as much air. exposed to pneumonia. possible trouble breathing. Nurse Assessment Nurse: Ronnald Ramp, RN, [place] Date/Time (Eastern Time): 10/29/2015 10:24:18 AM Confirm and document reason for call. If symptomatic, describe symptoms. You must click the next button to save text entered. ---Spoke with pt, states she is having pain in the right side of her sternum and into her back. 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 Pt states it hurts to breath. Has the patient traveled out of the country within the last 30 days? ---Not Applicable Does the patient have any new or worsening symptoms? ---Yes Will a triage be completed? ---Yes Related visit to physician within the last 2 weeks? ---No Does the PT have any chronic conditions? (i.e. diabetes, asthma, etc.) ---Yes List chronic conditions. ---hx of coronary artery spasm Is the patient pregnant or possibly pregnant? (Ask all females between the ages of 67-55) ---No Is this a behavioral health or substance abuse call? ---No Guidelines Guideline Title Affirmed Question Affirmed Notes Chest Pain [1] Intermittent chest pain or "angina" AND [2] increasing in severity or frequency (Exception: pains lasting a few seconds) Final Disposition User Go to ED Now Ronnald Ramp, RN, [facility] - ED Disagree/Comply: Comply

## 2015-10-29 NOTE — Discharge Instructions (Signed)
You were evaluated for right-sided chest and back pain, and I believe the cause is a muscle spasm in your back. Your examination and evaluation are reassuring in the emergency department.  We discussed, use heat and massage as well as anti-inflammatory ibuprofen, available over-the-counter, every 8 hours as needed for about one week. Muscle relaxer Flexeril was also provided for as needed relief.   Return to emergency department for any worsening condition including trouble breathing, fever, chest pain, palpitations, dizziness or passing out, or any other symptoms concerning to.   Nonspecific Chest Pain  Chest pain can be caused by many different conditions. There is always a chance that your pain could be related to something serious, such as a heart attack or a blood clot in your lungs. Chest pain can also be caused by conditions that are not life-threatening. If you have chest pain, it is very important to follow up with your health care provider. CAUSES  Chest pain can be caused by:  Heartburn.  Pneumonia or bronchitis.  Anxiety or stress.  Inflammation around your heart (pericarditis) or lung (pleuritis or pleurisy).  A blood clot in your lung.  A collapsed lung (pneumothorax). It can develop suddenly on its own (spontaneous pneumothorax) or from trauma to the chest.  Shingles infection (varicella-zoster virus).  Heart attack.  Damage to the bones, muscles, and cartilage that make up your chest wall. This can include:  Bruised bones due to injury.  Strained muscles or cartilage due to frequent or repeated coughing or overwork.  Fracture to one or more ribs.  Sore cartilage due to inflammation (costochondritis). RISK FACTORS  Risk factors for chest pain may include:  Activities that increase your risk for trauma or injury to your chest.  Respiratory infections or conditions that cause frequent coughing.  Medical conditions or overeating that can cause  heartburn.  Heart disease or family history of heart disease.  Conditions or health behaviors that increase your risk of developing a blood clot.  Having had chicken pox (varicella zoster). SIGNS AND SYMPTOMS Chest pain can feel like:  Burning or tingling on the surface of your chest or deep in your chest.  Crushing, pressure, aching, or squeezing pain.  Dull or sharp pain that is worse when you move, cough, or take a deep breath.  Pain that is also felt in your back, neck, shoulder, or arm, or pain that spreads to any of these areas. Your chest pain may come and go, or it may stay constant. DIAGNOSIS Lab tests or other studies may be needed to find the cause of your pain. Your health care provider may have you take a test called an ambulatory ECG (electrocardiogram). An ECG records your heartbeat patterns at the time the test is performed. You may also have other tests, such as:  Transthoracic echocardiogram (TTE). During echocardiography, sound waves are used to create a picture of all of the heart structures and to look at how blood flows through your heart.  Transesophageal echocardiogram (TEE).This is a more advanced imaging test that obtains images from inside your body. It allows your health care provider to see your heart in finer detail.  Cardiac monitoring. This allows your health care provider to monitor your heart rate and rhythm in real time.  Holter monitor. This is a portable device that records your heartbeat and can help to diagnose abnormal heartbeats. It allows your health care provider to track your heart activity for several days, if needed.  Stress tests. These can be  done through exercise or by taking medicine that makes your heart beat more quickly.  Blood tests.  Imaging tests. TREATMENT  Your treatment depends on what is causing your chest pain. Treatment may include:  Medicines. These may include:  Acid blockers for heartburn.  Anti-inflammatory  medicine.  Pain medicine for inflammatory conditions.  Antibiotic medicine, if an infection is present.  Medicines to dissolve blood clots.  Medicines to treat coronary artery disease.  Supportive care for conditions that do not require medicines. This may include:  Resting.  Applying heat or cold packs to injured areas.  Limiting activities until pain decreases. HOME CARE INSTRUCTIONS  If you were prescribed an antibiotic medicine, finish it all even if you start to feel better.  Avoid any activities that bring on chest pain.  Do not use any tobacco products, including cigarettes, chewing tobacco, or electronic cigarettes. If you need help quitting, ask your health care provider.  Do not drink alcohol.  Take medicines only as directed by your health care provider.  Keep all follow-up visits as directed by your health care provider. This is important. This includes any further testing if your chest pain does not go away.  If heartburn is the cause for your chest pain, you may be told to keep your head raised (elevated) while sleeping. This reduces the chance that acid will go from your stomach into your esophagus.  Make lifestyle changes as directed by your health care provider. These may include:  Getting regular exercise. Ask your health care provider to suggest some activities that are safe for you.  Eating a heart-healthy diet. A registered dietitian can help you to learn healthy eating options.  Maintaining a healthy weight.  Managing diabetes, if necessary.  Reducing stress. SEEK MEDICAL CARE IF:  Your chest pain does not go away after treatment.  You have a rash with blisters on your chest.  You have a fever. SEEK IMMEDIATE MEDICAL CARE IF:   Your chest pain is worse.  You have an increasing cough, or you cough up blood.  You have severe abdominal pain.  You have severe weakness.  You faint.  You have chills.  You have sudden, unexplained chest  discomfort.  You have sudden, unexplained discomfort in your arms, back, neck, or jaw.  You have shortness of breath at any time.  You suddenly start to sweat, or your skin gets clammy.  You feel nauseous or you vomit.  You suddenly feel light-headed or dizzy.  Your heart begins to beat quickly, or it feels like it is skipping beats. These symptoms may represent a serious problem that is an emergency. Do not wait to see if the symptoms will go away. Get medical help right away. Call your local emergency services (911 in the [place] Do not drive yourself to the hospital.   This information is not intended to replace advice given to you by your health care provider. Make sure you discuss any questions you have with your health care provider.   Document Released: 05/11/2005 Document Revised: 08/22/2014 Document Reviewed: 03/07/2014 Elsevier Interactive Patient Education 2016 [employer] Muscle Cramps and Spasms Muscle cramps and spasms are when muscles tighten by themselves. They usually get better within minutes. Muscle cramps are painful. They are usually stronger and last longer than muscle spasms. Muscle spasms may or may not be painful. They can last a few seconds or much longer. HOME CARE  Drink enough fluid to keep your pee (urine) clear or pale yellow.  Massage, stretch, and relax the muscle.  Use a warm towel, heating pad, or warm shower water on tight muscles.  Place ice on the muscle if it is tender or in pain.  Put ice in a plastic bag.  Place a towel between your skin and the bag.  Leave the ice on for 15-20 minutes, 03-04 times a day.  Only take medicine as told by your doctor. GET HELP RIGHT AWAY IF:  Your cramps or spasms get worse, happen more often, or do not get better with time. MAKE SURE YOU:  Understand these instructions.  Will watch your condition.  Will get help right away if you are not doing well or get worse.   This information is not  intended to replace advice given to you by your health care provider. Make sure you discuss any questions you have with your health care provider.   Document Released: 07/14/2008 Document Revised: 11/26/2012 Document Reviewed: 07/18/2012 Elsevier Interactive Patient Education [employer].

## 2015-10-30 ENCOUNTER — Telehealth: Payer: Self-pay | Admitting: Cardiovascular Disease

## 2015-10-30 NOTE — Telephone Encounter (Signed)
lmov to make ED fu from 10/29/15

## 2015-11-12 NOTE — Telephone Encounter (Signed)
Called patient she is coming 12/26/15 to see Dr Fletcher Anon for her ED visit on 10/29/15

## 2015-12-25 ENCOUNTER — Ambulatory Visit: Payer: BC Managed Care – PPO | Admitting: Cardiovascular Disease

## 2016-01-08 ENCOUNTER — Encounter: Payer: Self-pay | Admitting: Family Medicine

## 2016-01-08 ENCOUNTER — Ambulatory Visit (INDEPENDENT_AMBULATORY_CARE_PROVIDER_SITE_OTHER): Payer: BC Managed Care – PPO | Admitting: Family Medicine

## 2016-01-08 VITALS — BP 106/70 | HR 69 | Temp 98.0°F | Ht 68.0 in | Wt 278.0 lb

## 2016-01-08 DIAGNOSIS — N63 Unspecified lump in breast: Secondary | ICD-10-CM

## 2016-01-08 DIAGNOSIS — N6019 Diffuse cystic mastopathy of unspecified breast: Secondary | ICD-10-CM | POA: Insufficient documentation

## 2016-01-08 DIAGNOSIS — N6314 Unspecified lump in the right breast, lower inner quadrant: Secondary | ICD-10-CM

## 2016-01-08 DIAGNOSIS — N6011 Diffuse cystic mastopathy of right breast: Secondary | ICD-10-CM

## 2016-01-08 NOTE — Progress Notes (Signed)
Pre visit review using our clinic review tool, if applicable. No additional management support is needed unless otherwise documented below in the visit note. 

## 2016-01-08 NOTE — Patient Instructions (Addendum)
I think you have fibrocystic breast tissue- common for your age  The area that bothers you feels the same on both breasts - reassuring Keep checking yourself  Wear a supportive bra that fits well and does not rub or pinch  Avoid caffeine  Drink water Stop at check out for referral for breast imaging  If worse or no improvement in the next month-please alert me Christena Flake

## 2016-01-08 NOTE — Progress Notes (Signed)
Subjective:    Patient ID: Brittney [name], female    DOB: 04/25/1981, 35 y.o.   MRN: [id number]  HPI Here with a lump in her lower R breast  Noticed it Wednesday  No redness/ cannot see it  It did hurt (it was aching)  Still hurts a little bit  No nipple discharge   Family hx of breast ca - cousin Jerilynn Mages Mother's cousin  [place] aunt M  Had checked by her gyn yesterday- thought it was dense tissue   Patient Active Problem List   Diagnosis Date Noted  . Fibrocystic breast changes 01/08/2016  . Breast lump on right side at 5 o'clock position 01/08/2016  . Right wrist pain 10/22/2015  . Yeast infection 08/19/2015  . Hemorrhoids, external 09/30/2014  . Chronic diarrhea 08/29/2014  . 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 Hemorrhoids, internal, with bleeding 04/22/2014  . Family history of pulmonary embolism 09/02/2013  . Family history of diabetes mellitus 09/02/2013  . Chest pain 07/09/2013  . Cutaneous skin tags 05/15/2013  . Sebaceous cyst of left axilla 05/15/2013  . DUB (dysfunctional uterine bleeding) 09/24/2012  . Endometritis 09/24/2012  . 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 EDEMA 11/04/2009  . OBESITY 03/31/2008  . HEARING LOSS, MILD 03/31/2008  . MIGRAINE HEADACHE 02/20/2007  . IBS 02/20/2007  . OSTEOARTHRITIS 02/20/2007   Past Medical History  Diagnosis Date  . Irritable bowel syndrome   . Obesity, unspecified   . Fibroids   . Migraine   . Anemia   . Coronary artery spasm [facility])    Past Surgical History  Procedure Laterality Date  . Colonoscopy  11/2001    polyp; negative pathology  . Colonoscopy  09/2004    Negative  . Uterine US  09/2005    Large endometrial stripe; small ovarian cyst  . Laparoscopic decortication / dubulking / ablation renal cysts      saline histogram  . Tonsillectomy  07/30/07  . Esophagogastroduodenoscopy  1/10    normal  . Colonoscopy w/ polypectomy    . Cholecystectomy  05/06/11  . Flexible sigmoidoscopy  03/05/2012      Procedure: FLEXIBLE SIGMOIDOSCOPY;  Surgeon: Inda Castle, MD;  Location: [facility];  Service: Endoscopy;  Laterality: N/A;   Social History  Substance Use Topics  . Smoking status: Never Smoker   . Smokeless tobacco: Never Used  . Alcohol Use: No     Comment: rare   Family History  Problem Relation Age of Onset  . Ulcers Brother   . Clotting disorder Brother   . Colon cancer Maternal Grandmother   . Ovarian cancer Maternal Grandmother   . Diabetes Paternal Grandmother   . Hypertension Paternal Grandmother   . Diabetes Paternal Aunt   . Heart disease Father   . Hypertension Father   . Heart attack Father   . Breast cancer Maternal Aunt     Age 1's  . Diabetes Paternal Aunt     x 3  . Lung cancer Maternal Grandfather     SMOKER   Allergies  Allergen Reactions  . Lansoprazole     REACTION: rash   Current Outpatient Prescriptions on File Prior to Visit  Medication Sig Dispense Refill  . alosetron (LOTRONEX) 0.5 MG tablet Take 0.5 mg by mouth 2 (two) times daily.    . cyclobenzaprine (FLEXERIL) 10 MG tablet Take 1 tablet (10 mg total) by mouth every 8 (eight) hours as needed for muscle spasms. 20 tablet 0  . nitroGLYCERIN (  NITROSTAT) 0.4 MG SL tablet Place 1 tablet (0.4 mg total) under the tongue every 5 (five) minutes as needed for chest pain. 25 tablet 6   No current facility-administered medications on file prior to visit.    Review of Systems    Review of Systems  Constitutional: Negative for fever, appetite change, fatigue and unexpected weight change.  Eyes: Negative for pain and visual disturbance.  Respiratory: Negative for cough and shortness of breath.   Cardiovascular: Negative for cp or palpitations    Gastrointestinal: Negative for nausea, diarrhea and constipation.  Genitourinary: Negative for urgency and frequency.  Skin: Negative for pallor or rash   Neurological: Negative for weakness, light-headedness, numbness and headaches.   Hematological: Negative for adenopathy. Does not bruise/bleed easily.  Psychiatric/Behavioral: Negative for dysphoric mood. The patient is not nervous/anxious.      Objective:   Physical Exam  Constitutional: She appears well-developed and well-nourished. No distress.  obese and well appearing   Eyes: Conjunctivae and EOM are normal. Pupils are equal, round, and reactive to light.  Neck: Normal range of motion. Neck supple. No thyromegaly present.  Cardiovascular: Normal rate and regular rhythm.   Pulmonary/Chest: Effort normal and breath sounds normal. No respiratory distress. She has no wheezes. She has no rales.  Genitourinary:  Breast exam: No mass, nodules, thickening, tenderness, bulging, retraction, inflamation, nipple discharge or skin changes noted.  No axillary or clavicular LA.      Tissue is thick and dense at lower edge of both breasts medially -without tenderness today  No signs of trauma   Musculoskeletal: She exhibits no edema.  Lymphadenopathy:    She has no cervical adenopathy.  Neurological: She is alert.  Skin: Skin is warm and dry. No rash noted. No erythema. No pallor.  Psychiatric: She has a normal mood and affect.          Assessment & Plan:   Problem List Items Addressed This Visit      Other   Fibrocystic breast changes - Primary    Disc this and how common it is  Enc caffeine avoidance (this can cause more pain) and need for regular self exams At pt's req- ordered imaging on R breast      Relevant Orders   US BREAST LTD UNI RIGHT INC AXILLA   MM Digital Diagnostic Bilat   Breast lump on right side at 5 o'clock position    This feels like fibrocystic dense tissue but pt desires imaging  diag mm and [place] ordered for the breast center  Enc regular self exams and caffeine avoidance      Relevant Orders   US BREAST LTD UNI RIGHT INC AXILLA   MM Digital Diagnostic Bilat

## 2016-01-10 NOTE — Assessment & Plan Note (Signed)
This feels like fibrocystic dense tissue but pt desires imaging  diag mm and [place] ordered for the breast center  Enc regular self exams and caffeine avoidance

## 2016-01-10 NOTE — Assessment & Plan Note (Signed)
Disc this and how common it is  Enc caffeine avoidance (this can cause more pain) and need for regular self exams At pt's req- ordered imaging on R breast

## 2016-01-15 ENCOUNTER — Ambulatory Visit
Admission: RE | Admit: 2016-01-15 | Discharge: 2016-01-15 | Disposition: A | Discharge status: Home / Self Care | Payer: BC Managed Care – PPO | Source: Ambulatory Visit | Attending: Family Medicine | Admitting: Family Medicine

## 2016-01-15 ENCOUNTER — Other Ambulatory Visit: Payer: Self-pay | Admitting: Family Medicine

## 2016-01-15 DIAGNOSIS — N6314 Unspecified lump in the right breast, lower inner quadrant: Secondary | ICD-10-CM

## 2016-01-15 DIAGNOSIS — N6011 Diffuse cystic mastopathy of right breast: Secondary | ICD-10-CM

## 2016-01-25 ENCOUNTER — Ambulatory Visit: Payer: BC Managed Care – PPO | Admitting: Cardiovascular Disease

## 2016-02-19 ENCOUNTER — Encounter: Payer: Self-pay | Admitting: Family Medicine

## 2016-02-19 ENCOUNTER — Ambulatory Visit (INDEPENDENT_AMBULATORY_CARE_PROVIDER_SITE_OTHER): Payer: BC Managed Care – PPO | Admitting: Family Medicine

## 2016-02-19 VITALS — BP 114/62 | HR 72 | Temp 97.7°F | Ht 68.0 in | Wt 281.0 lb

## 2016-02-19 DIAGNOSIS — T148 Other injury of unspecified body region: Secondary | ICD-10-CM | POA: Diagnosis not present

## 2016-02-19 DIAGNOSIS — T148XXA Other injury of unspecified body region, initial encounter: Secondary | ICD-10-CM

## 2016-02-19 NOTE — Progress Notes (Signed)
Subjective:    Patient ID: Brittney [name], female    DOB: [birth year], 35 y.o.   MRN: [id number]  HPI Here with a skin spot on her breast   First noticed it last week when getting out of the shower No pain or itchiing  Originally 2 spots - dark and flat  Now a little raised   Not bothering her   Patient Active Problem List   Diagnosis Date Noted  . Contusion 02/19/2016  . Fibrocystic breast changes 01/08/2016  . Breast lump on right side at 5 o'clock position 01/08/2016  . Hemorrhoids, external 09/30/2014  . Chronic diarrhea 08/29/2014  . 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 Hemorrhoids, internal, with bleeding 04/22/2014  . Family history of pulmonary embolism 09/02/2013  . Family history of diabetes mellitus 09/02/2013  . Cutaneous skin tags 05/15/2013  . Sebaceous cyst of left axilla 05/15/2013  . DUB (dysfunctional uterine bleeding) 09/24/2012  . Endometritis 09/24/2012  . 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 EDEMA 11/04/2009  . OBESITY 03/31/2008  . HEARING LOSS, MILD 03/31/2008  . MIGRAINE HEADACHE 02/20/2007  . IBS 02/20/2007  . OSTEOARTHRITIS 02/20/2007   Past Medical History  Diagnosis Date  . Irritable bowel syndrome   . Obesity, unspecified   . Fibroids   . Migraine   . Anemia   . Coronary artery spasm [facility])    Past Surgical History  Procedure Laterality Date  . Colonoscopy  11/2001    polyp; negative pathology  . Colonoscopy  09/2004    Negative  . Uterine US  09/2005    Large endometrial stripe; small ovarian cyst  . Laparoscopic decortication / dubulking / ablation renal cysts      saline histogram  . Tonsillectomy  07/30/07  . Esophagogastroduodenoscopy  1/10    normal  . Colonoscopy w/ polypectomy    . Cholecystectomy  05/06/11  . Flexible sigmoidoscopy  03/05/2012    Procedure: FLEXIBLE SIGMOIDOSCOPY;  Surgeon: Inda Castle, MD;  [facility];  Service: Endoscopy;  Laterality: N/A;   Social History  Substance Use  Topics  . Smoking status: Never Smoker   . Smokeless tobacco: Never Used  . Alcohol Use: No     Comment: rare   Family History  Problem Relation Age of Onset  . Ulcers Brother   . Clotting disorder Brother   . Colon cancer Maternal Grandmother   . Ovarian cancer Maternal Grandmother   . Diabetes Paternal Grandmother   . Hypertension Paternal Grandmother   . Diabetes Paternal Aunt   . Heart disease Father   . Hypertension Father   . Heart attack Father   . Breast cancer Maternal Aunt     Age 73's  . Diabetes Paternal Aunt     x 3  . Lung cancer Maternal Grandfather     SMOKER   Allergies  Allergen Reactions  . Lansoprazole     REACTION: rash   Current Outpatient Prescriptions on File Prior to Visit  Medication Sig Dispense Refill  . alosetron (LOTRONEX) 0.5 MG tablet Take 0.5 mg by mouth 2 (two) times daily.    . cyclobenzaprine (FLEXERIL) 10 MG tablet Take 1 tablet (10 mg total) by mouth every 8 (eight) hours as needed for muscle spasms. 20 tablet 0  . nitroGLYCERIN (NITROSTAT) 0.4 MG SL tablet Place 1 tablet (0.4 mg total) under the tongue every 5 (five) minutes as needed for chest pain. 25 tablet 6   No current facility-administered medications on file  prior to visit.    Review of Systems Review of Systems  Constitutional: Negative for fever, appetite change, fatigue and unexpected weight change.  Eyes: Negative for pain and visual disturbance.  Respiratory: Negative for cough and shortness of breath.   Cardiovascular: Negative for cp or palpitations    Gastrointestinal: Negative for nausea, diarrhea and constipation.  Genitourinary: Negative for urgency and frequency.  Skin: Negative for pallor or rash  no itching or pain  Neurological: Negative for weakness, light-headedness, numbness and headaches.  Hematological: Negative for adenopathy. Does not bruise/bleed easily.  Psychiatric/Behavioral: Negative for dysphoric mood. The patient is not nervous/anxious.           Objective:   Physical Exam  Constitutional: She appears well-developed and well-nourished. No distress.  obese and well appearing   Eyes: Conjunctivae and EOM are normal. Pupils are equal, round, and reactive to light.  Neck: Normal range of motion. Neck supple.  Genitourinary:  Breast exam: No mass, nodules, thickening, tenderness, bulging, retraction, inflamation, nipple discharge noted.  No axillary or clavicular LA.      See skin exam for R breast  Lymphadenopathy:    She has no cervical adenopathy.  Skin: Skin is warm and dry. No erythema.  2 areas (oval) on R breast (about .5 cm and 1 cm long)- very slightly raised and hyperpigmented with darker edge than center No skin breakdown or scale or vesicles  No excoriation   Psychiatric: She has a normal mood and affect.  Pleasant and talkative          Assessment & Plan:   Problem List Items Addressed This Visit      Other   Contusion - Primary    Darkened areas on R breast resemble a contusion that is resolving (no remembered trauma)  Will watch closely- if not resolved in 2 weeks ref to dermatology Enc self breaste exams

## 2016-02-19 NOTE — Progress Notes (Signed)
Pre visit review using our clinic review tool, if applicable. No additional management support is needed unless otherwise documented below in the visit note. 

## 2016-02-19 NOTE — Patient Instructions (Signed)
I think you have a skin contusion on the breast ( a bruise that is resolving)- even though you do not remember any trauma  Watch it for 2 weeks  If worse or not improving or any symptoms such as pain or itching- let me know and we will treat or refer to dermatology

## 2016-02-21 NOTE — Assessment & Plan Note (Signed)
Darkened areas on R breast resemble a contusion that is resolving (no remembered trauma)  Will watch closely- if not resolved in 2 weeks ref to dermatology Enc self breaste exams

## 2016-02-29 ENCOUNTER — Emergency Department: Payer: BC Managed Care – PPO

## 2016-02-29 ENCOUNTER — Encounter: Payer: Self-pay | Admitting: Emergency Medicine

## 2016-02-29 ENCOUNTER — Emergency Department
Admission: EM | Admit: 2016-02-29 | Discharge: 2016-02-29 | Disposition: A | Discharge status: Home / Self Care | Payer: BC Managed Care – PPO | Attending: Emergency Medicine | Admitting: Emergency Medicine

## 2016-02-29 DIAGNOSIS — Z79899 Other long term (current) drug therapy: Secondary | ICD-10-CM | POA: Insufficient documentation

## 2016-02-29 DIAGNOSIS — K2970 Gastritis, unspecified, without bleeding: Secondary | ICD-10-CM | POA: Diagnosis not present

## 2016-02-29 DIAGNOSIS — R079 Chest pain, unspecified: Secondary | ICD-10-CM | POA: Diagnosis present

## 2016-02-29 DIAGNOSIS — M1990 Unspecified osteoarthritis, unspecified site: Secondary | ICD-10-CM | POA: Diagnosis not present

## 2016-02-29 LAB — BASIC METABOLIC PANEL
ANION GAP: 8 (ref 5–15)
BUN: 12 mg/dL (ref 6–20)
CHLORIDE: 107 mmol/L (ref 101–111)
CO2: 23 mmol/L (ref 22–32)
Calcium: 8.9 mg/dL (ref 8.9–10.3)
Creatinine, Ser: 1.01 mg/dL — ABNORMAL HIGH (ref 0.44–1.00)
GFR calc non Af Amer: >60 mL/min (ref >60)
GLUCOSE: 97 mg/dL (ref 65–99)
POTASSIUM: 3.8 mmol/L (ref 3.5–5.1)
Sodium: 138 mmol/L (ref 135–145)

## 2016-02-29 LAB — CBC
HEMATOCRIT: 36.7 % (ref 35.0–47.0)
HEMOGLOBIN: 11.9 g/dL — AB (ref 12.0–16.0)
MCH: 25.1 pg — AB (ref 26.0–34.0)
MCHC: 32.5 g/dL (ref 32.0–36.0)
MCV: 77.3 fL — AB (ref 80.0–100.0)
Platelets: 259 10*3/uL (ref 150–440)
RBC: 4.75 MIL/uL (ref 3.80–5.20)
RDW: 18.4 % — ABNORMAL HIGH (ref 11.5–14.5)
WBC: 8.2 10*3/uL (ref 3.6–11.0)

## 2016-02-29 LAB — TROPONIN I

## 2016-02-29 LAB — POCT PREGNANCY, URINE: Preg Test, Ur: NEGATIVE

## 2016-02-29 MED ORDER — SUCRALFATE 1 G PO TABS: 1.0000 g | ORAL_TABLET | Freq: Four times a day (QID) | ORAL | Status: DC

## 2016-02-29 MED ORDER — RANITIDINE HCL 150 MG PO TABS: 150.0000 mg | ORAL_TABLET | Freq: Every day | ORAL | Status: DC

## 2016-02-29 MED ORDER — GI COCKTAIL ~~LOC~~
30.0000 mL | Freq: Once | ORAL | Status: AC
  Administered 2016-02-29: 30 mL via ORAL
  Filled 2016-02-29: qty 30

## 2016-02-29 NOTE — Discharge Instructions (Signed)
Please seek medical attention for any high fevers, chest pain, shortness of breath, change in behavior, persistent vomiting, bloody stool or any other new or concerning symptoms.   Gastritis, Adult Gastritis is soreness and puffiness (inflammation) of the lining of the stomach. If you do not get help, gastritis can cause bleeding and sores (ulcers) in the stomach. HOME CARE   Only take medicine as told by your doctor.  If you were given antibiotic medicines, take them as told. Finish the medicines even if you start to feel better.  Drink enough fluids to keep your pee (urine) clear or pale yellow.  Avoid foods and drinks that make your problems worse. Foods you may want to avoid include:  Caffeine or alcohol.  Chocolate.  Mint.  Garlic and onions.  Spicy foods.  Citrus fruits, including oranges, lemons, or limes.  Food containing tomatoes, including sauce, chili, salsa, and pizza.  Fried and fatty foods.  Eat small meals throughout the day instead of large meals. GET HELP RIGHT AWAY IF:   You have black or dark red poop (stools).  You throw up (vomit) blood. It may look like coffee grounds.  You cannot keep fluids down.  Your belly (abdominal) pain gets worse.  You have a fever.  You do not feel better after 1 week.  You have any other questions or concerns. MAKE SURE YOU:   Understand these instructions.  Will watch your condition.  Will get help right away if you are not doing well or get worse.   This information is not intended to replace advice given to you by your health care provider. Make sure you discuss any questions you have with your health care provider.   Document Released: 01/18/2008 Document Revised: 10/24/2011 Document Reviewed: 09/14/2011 Elsevier Interactive Patient Education [employer].

## 2016-02-29 NOTE — ED Notes (Signed)
Pt reports that she has a pain in the middle of her chest that radiates into the center of her back - she states that she went to [facility] walk in and they sent her here for evaluation after triaging her - Standing makes the pain worse and laying flat makes the pain worse - the pain is an aching sharp pain - pt has had the pain for about a week - Two weeks ago she did some exercises and she thought this was related to the exercise pain but since that she is not certain what is causing the pain - Pt denies nausea and vomiting

## 2016-02-29 NOTE — ED Provider Notes (Signed)
[facility] Emergency Department Provider Note    ____________________________________________  Time seen: ~2015  I have reviewed the triage vital signs and the nursing notes.   HISTORY  Chief Complaint Chest Pain   History limited by: Not Limited   HPI Brittney [name] is a 35 y.o. female presents to the emergency department today because of concerns for chest pain. The pain is located under her left breast. She also has some pain that radiates into her back. This is been present for about a week. The pain comes and goes but appears to be worse with movement. The patient denies any shortness of breath with this. She denies any fevers. No cough. Patient does state that roughly 2 weeks ago she was working out and had some soreness in her absent that time but that is resolved. Addition the patient states that she had her period a little over a week ago and was taking high-dose ibuprofen.   Past Medical History  Diagnosis Date  . Irritable bowel syndrome   . Obesity, unspecified   . Fibroids   . Migraine   . Anemia   . Coronary artery spasm [facility])     Patient Active Problem List   Diagnosis Date Noted  . Contusion 02/19/2016  . Fibrocystic breast changes 01/08/2016  . Breast lump on right side at 5 o'clock position 01/08/2016  . Hemorrhoids, external 09/30/2014  . Chronic diarrhea 08/29/2014  . 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 Hemorrhoids, internal, with bleeding 04/22/2014  . Family history of pulmonary embolism 09/02/2013  . Family history of diabetes mellitus 09/02/2013  . Cutaneous skin tags 05/15/2013  . Sebaceous cyst of left axilla 05/15/2013  . DUB (dysfunctional uterine bleeding) 09/24/2012  . Endometritis 09/24/2012  . 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 EDEMA 11/04/2009  . OBESITY 03/31/2008  . HEARING LOSS, MILD 03/31/2008  . MIGRAINE HEADACHE 02/20/2007  . IBS 02/20/2007  . OSTEOARTHRITIS 02/20/2007    Past  Surgical History  Procedure Laterality Date  . Colonoscopy  11/2001    polyp; negative pathology  . Colonoscopy  09/2004    Negative  . Uterine US  09/2005    Large endometrial stripe; small ovarian cyst  . Laparoscopic decortication / dubulking / ablation renal cysts      saline histogram  . Tonsillectomy  07/30/07  . Esophagogastroduodenoscopy  1/10    normal  . Colonoscopy w/ polypectomy    . Cholecystectomy  05/06/11  . Flexible sigmoidoscopy  03/05/2012    Procedure: FLEXIBLE SIGMOIDOSCOPY;  Surgeon: Inda Castle, MD;  Location[facility];  Service: Endoscopy;  Laterality: N/A;    Current Outpatient Rx  Name  Route  Sig  Dispense  Refill  . alosetron (LOTRONEX) 0.5 MG tablet   Oral   Take 0.5 mg by mouth 2 (two) times daily.         . cyclobenzaprine (FLEXERIL) 10 MG tablet   Oral   Take 1 tablet (10 mg total) by mouth every 8 (eight) hours as needed for muscle spasms.   20 tablet   0   . nitroGLYCERIN (NITROSTAT) 0.4 MG SL tablet   Sublingual   Place 1 tablet (0.4 mg total) under the tongue every 5 (five) minutes as needed for chest pain.   25 tablet   6     Allergies Lansoprazole  Family History  Problem Relation Age of Onset  . Ulcers Brother   . Clotting disorder Brother   . Colon cancer  Maternal Grandmother   . Ovarian cancer Maternal Grandmother   . Diabetes Paternal Grandmother   . Hypertension Paternal Grandmother   . Diabetes Paternal Aunt   . Heart disease Father   . Hypertension Father   . Heart attack Father   . Breast cancer Maternal Aunt     Age 91's  . Diabetes Paternal Aunt     x 3  . Lung cancer Maternal Grandfather     SMOKER    Social History Social History  Substance Use Topics  . Smoking status: Never Smoker   . Smokeless tobacco: Never Used  . Alcohol Use: No     Comment: rare    Review of Systems  Constitutional: Negative for fever. Cardiovascular: Positive for chest pain. Respiratory: Negative for shortness of  breath. Gastrointestinal: Negative for abdominal pain, vomiting and diarrhea. Neurological: Negative for headaches, focal weakness or numbness.   10-point ROS otherwise negative.  ____________________________________________   PHYSICAL EXAM:  VITAL SIGNS: ED Triage Vitals  Enc Vitals Group     BP 02/29/16 1847 128/69 mmHg     Pulse Rate 02/29/16 1847 77     Resp 02/29/16 1847 16     Temp 02/29/16 1847 98.2 F (36.8 C)     Temp Source 02/29/16 1847 Oral     SpO2 02/29/16 1847 100 %     Weight 02/29/16 1847 280 lb (127.007 kg)     Height 02/29/16 1847 5\' 9"  (1.753 m)     Head Cir --      Peak Flow --      Pain Score 02/29/16 1848 8   Constitutional: Alert and oriented. Well appearing and in no distress. Eyes: Conjunctivae are normal. PERRL. Normal extraocular movements. ENT   Head: Normocephalic and atraumatic.   Nose: No congestion/rhinnorhea.   Mouth/Throat: Mucous membranes are moist.   Neck: No stridor. Hematological/Lymphatic/Immunilogical: No cervical lymphadenopathy. Cardiovascular: Normal rate, regular rhythm.  No murmurs, rubs, or gallops. Respiratory: Normal respiratory effort without tachypnea nor retractions. Breath sounds are clear and equal bilaterally. No wheezes/rales/rhonchi. Gastrointestinal: Soft and Tender to palpation under the left breast and in the epigastric region. No distention. There is no CVA tenderness. Genitourinary: Deferred Musculoskeletal: Normal range of motion in all extremities. No joint effusions.  No lower extremity tenderness nor edema. Neurologic:  Normal speech and language. No gross focal neurologic deficits are appreciated.  Skin:  Skin is warm, dry and intact. No rash noted. Psychiatric: Mood and affect are normal. Speech and behavior are normal. Patient exhibits appropriate insight and judgment.  ____________________________________________    LABS (pertinent positives/negatives)  Labs Reviewed  BASIC METABOLIC  PANEL - Abnormal; Notable for the following:    Creatinine, Ser 1.01 (*)    All other components within normal limits  CBC - Abnormal; Notable for the following:    Hemoglobin 11.9 (*)    MCV 77.3 (*)    MCH 25.1 (*)    RDW 18.4 (*)    All other components within normal limits  TROPONIN I  POCT PREGNANCY, URINE     ____________________________________________   EKG  I, Nance Pear, attending physician, personally viewed and interpreted this EKG  EKG Time: 1848 Rate: 73 Rhythm: normal sinus rhythm Axis: left axis deviation Intervals: qtc 414 QRS: Incomplete RBBB ST changes: st elevation V2 Impression: abnormal ekg  Not significantly changed from EKG dated 10/29/15  ____________________________________________    RADIOLOGY  CXR  IMPRESSION: Normal chest radiographs.  ____________________________________________   PROCEDURES  Procedure(s) performed:  None  Critical Care performed: No  ____________________________________________   INITIAL IMPRESSION / ASSESSMENT AND PLAN / ED COURSE  Pertinent labs & imaging results that were available during my care of the patient were reviewed by me and considered in my medical decision making (see chart for details).  Patient presented to the emergency department today because of concerns for some chest pain. On exam patient without any overly concerning findings although she did have some mild tenderness in the left upper quadrant and epigastrium. Patient's chest x-ray and blood work without concerning findings. Patient was given a GI cocktail which did help the patient's symptoms significantly. This point I think likely patient suffering from gastritis likely secondary to ibuprofen use. Did discuss this with patient. Will sent home on antacids and sucralfate.  ____________________________________________   FINAL CLINICAL IMPRESSION(S) / ED DIAGNOSES  Final diagnoses:  Gastritis     Note: This dictation was  prepared with Dragon dictation. Any transcriptional errors that result from this process are unintentional    Nance Pear, MD 02/29/16 2125

## 2016-02-29 NOTE — ED Notes (Signed)
Per Dr Archie Balboa if the GI Cocktail does not help with the chest pain verbal order given for Toradol 60mg  IM once

## 2016-02-29 NOTE — ED Notes (Signed)
MD at bedside. 

## 2016-02-29 NOTE — ED Notes (Signed)
Pt reports left sided chest pain that radiates into back x1 week; pain is intermittent, pain is worse upon standing or moving. Pt denies shob, n/v.

## 2016-03-01 ENCOUNTER — Telehealth: Payer: Self-pay | Admitting: Family Medicine

## 2016-03-01 DIAGNOSIS — K2970 Gastritis, unspecified, without bleeding: Secondary | ICD-10-CM

## 2016-03-01 NOTE — Telephone Encounter (Signed)
Patient went to [facility] ER yesterday and they recommended she see a GI Dr or her PCP. She would like a Referral to [facility] and the best phone number to call her back is [phone] Please place referral if appropriate.

## 2016-03-02 DIAGNOSIS — K2970 Gastritis, unspecified, without bleeding: Secondary | ICD-10-CM | POA: Insufficient documentation

## 2016-03-02 NOTE — Telephone Encounter (Signed)
Referral filled out and faxed to [facility]

## 2016-03-02 NOTE — Telephone Encounter (Signed)
I will do ref and route to [facility]

## 2016-03-03 ENCOUNTER — Ambulatory Visit (INDEPENDENT_AMBULATORY_CARE_PROVIDER_SITE_OTHER): Payer: BC Managed Care – PPO | Admitting: Cardiovascular Disease

## 2016-03-23 ENCOUNTER — Telehealth: Payer: Self-pay

## 2016-03-23 MED ORDER — FLUCONAZOLE 150 MG PO TABS: 150.0000 mg | ORAL_TABLET | Freq: Once | ORAL | 0 refills | Status: DC

## 2016-03-23 MED ORDER — FLUCONAZOLE 150 MG PO TABS: 150.0000 mg | ORAL_TABLET | Freq: Once | ORAL | 0 refills | Status: AC

## 2016-03-23 NOTE — Telephone Encounter (Signed)
Rx sent and pt notified.

## 2016-03-23 NOTE — Telephone Encounter (Signed)
Pt states she has a yeast infection and is requesting a prescription for diflucan. Pt was here on 02/19/2016. Please advise.

## 2016-03-23 NOTE — Telephone Encounter (Signed)
Please call it in  I cannot find that pharmacy in the computer for elect px

## 2016-03-23 NOTE — Telephone Encounter (Signed)
Pt would like this rx to be sent to [id number] [facility]

## 2016-05-18 ENCOUNTER — Encounter: Payer: Self-pay | Admitting: Family Medicine

## 2016-05-18 ENCOUNTER — Ambulatory Visit (INDEPENDENT_AMBULATORY_CARE_PROVIDER_SITE_OTHER): Payer: BC Managed Care – PPO | Admitting: Family Medicine

## 2016-05-18 VITALS — BP 108/66 | HR 74 | Temp 98.2°F | Ht 68.0 in | Wt 280.2 lb

## 2016-05-18 DIAGNOSIS — Z23 Encounter for immunization: Secondary | ICD-10-CM | POA: Diagnosis not present

## 2016-05-18 DIAGNOSIS — K6289 Other specified diseases of anus and rectum: Secondary | ICD-10-CM | POA: Insufficient documentation

## 2016-05-18 MED ORDER — TRIAMCINOLONE ACETONIDE 0.025 % EX CREA: 1.0000 "application " | TOPICAL_CREAM | Freq: Every day | CUTANEOUS | 0 refills | Status: DC | PRN

## 2016-05-18 NOTE — Assessment & Plan Note (Signed)
I do not see signs of a fistula today  Pt does have some skin irritation superior to her anus in the cleft with an area of cracking (no redness or drainage)- this may be from recent diarrhea and wiping Will px triamcinolone cream to use in pm, she will continue abx oint am  Cautioned to wipe gently- avoid harsh detergents and update if symptoms worsen or do not improve

## 2016-05-18 NOTE — Progress Notes (Signed)
Subjective:    Patient ID: Brittney [name], female    DOB: 12-20-1980, 35 y.o.   MRN: [id number]  HPI Here with a wound on her back   Thinks it is a fistula  Thinks 1.5-2 weeks  Some discharge - no color to it  It itches - uncomfortable at times  No trauma   Had some diarrhea a little while back   Has had one before - never had a surgeon look at it   No fever/feeling ok   Patient Active Problem List   Diagnosis Date Noted  . Rectal irritation 05/18/2016  . Gastritis 03/02/2016  . Contusion 02/19/2016  . Fibrocystic breast changes 01/08/2016  . Breast lump on right side at 5 o'clock position 01/08/2016  . Hemorrhoids, external 09/30/2014  . Chronic diarrhea 08/29/2014  . 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 Hemorrhoids, internal, with bleeding 04/22/2014  . Family history of pulmonary embolism 09/02/2013  . Family history of diabetes mellitus 09/02/2013  . Cutaneous skin tags 05/15/2013  . Sebaceous cyst of left axilla 05/15/2013  . DUB (dysfunctional uterine bleeding) 09/24/2012  . Endometritis 09/24/2012  . 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 EDEMA 11/04/2009  . OBESITY 03/31/2008  . HEARING LOSS, MILD 03/31/2008  . MIGRAINE HEADACHE 02/20/2007  . IBS 02/20/2007  . OSTEOARTHRITIS 02/20/2007   Past Medical History:  Diagnosis Date  . Anemia   . 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 Fibroids   . Irritable bowel syndrome   . Migraine   . Obesity, unspecified    Past Surgical History:  Procedure Laterality Date  . CHOLECYSTECTOMY  05/06/11  . COLONOSCOPY  11/2001   polyp; negative pathology  . COLONOSCOPY  09/2004   Negative  . COLONOSCOPY W/ POLYPECTOMY    . ESOPHAGOGASTRODUODENOSCOPY  1/10   normal  . FLEXIBLE SIGMOIDOSCOPY  03/05/2012   Procedure: FLEXIBLE SIGMOIDOSCOPY;  Surgeon: Inda Castle, MD;  Location: [facility];  Service: Endoscopy;  Laterality: N/A;  . LAPAROSCOPIC DECORTICATION / DUBULKING / ABLATION RENAL CYSTS     saline  histogram  . TONSILLECTOMY  07/30/07  . Uterine US  09/2005   Large endometrial stripe; small ovarian cyst   Social History  Substance Use Topics  . Smoking status: Never Smoker  . Smokeless tobacco: Never Used  . Alcohol use No     Comment: rare   Family History  Problem Relation Age of Onset  . Ulcers Brother   . Clotting disorder Brother   . Colon cancer Maternal Grandmother   . Ovarian cancer Maternal Grandmother   . Diabetes Paternal Grandmother   . Hypertension Paternal Grandmother   . Diabetes Paternal Aunt   . Heart disease Father   . Hypertension Father   . Heart attack Father   . Breast cancer Maternal Aunt     Age 5's  . Diabetes Paternal Aunt     x 3  . Lung cancer Maternal Grandfather     SMOKER   Allergies  Allergen Reactions  . Lansoprazole     REACTION: rash   Current Outpatient Prescriptions on File Prior to Visit  Medication Sig Dispense Refill  . cyclobenzaprine (FLEXERIL) 10 MG tablet Take 1 tablet (10 mg total) by mouth every 8 (eight) hours as needed for muscle spasms. 20 tablet 0  . nitroGLYCERIN (NITROSTAT) 0.4 MG SL tablet Place 1 tablet (0.4 mg total) under the tongue every 5 (five) minutes as needed for chest pain. 25 tablet 6  . ranitidine (  ZANTAC) 150 MG tablet Take 1 tablet (150 mg total) by mouth at bedtime. 30 tablet 1  . sucralfate (CARAFATE) 1 g tablet Take 1 tablet (1 g total) by mouth 4 (four) times daily. 60 tablet 0  . alosetron (LOTRONEX) 0.5 MG tablet Take 0.5 mg by mouth 2 (two) times daily.     No current facility-administered medications on file prior to visit.     Review of Systems Review of Systems  Constitutional: Negative for fever, appetite change, fatigue and unexpected weight change.  Eyes: Negative for pain and visual disturbance.  Respiratory: Negative for cough and shortness of breath.   Cardiovascular: Negative for cp or palpitations    Gastrointestinal: Negative for nausea, diarrhea and constipation.    Genitourinary: Negative for urgency and frequency.  Skin: Negative for pallor or rash  pos for irritation and itching in anal area  Neurological: Negative for weakness, light-headedness, numbness and headaches.  Hematological: Negative for adenopathy. Does not bruise/bleed easily.  Psychiatric/Behavioral: Negative for dysphoric mood. The patient is not nervous/anxious.         Objective:   Physical Exam  Constitutional:  obese and well appearing   HENT:  Head: Normocephalic and atraumatic.  Abdominal: Soft. Bowel sounds are normal.  No suprapubic tenderness or fullness    Genitourinary: Rectum normal. Rectal exam shows no external hemorrhoid, no mass and no tenderness.  Genitourinary Comments: Pt has skin irritation in anal cleft superior to anus  One slightly cracked area (no hole or indication of fistula seen) no drainage , slightly tender  No mass or abscess noted on either side and no external hemorrhoids  No rash or excoriation   Musculoskeletal: She exhibits no edema.  Neurological: She is alert.  Skin: Skin is warm and dry. No rash noted.  Psychiatric: She has a normal mood and affect.          Assessment & Plan:   Problem List Items Addressed This Visit      Digestive   Rectal irritation    I do not see signs of a fistula today  Pt does have some skin irritation superior to her anus in the cleft with an area of cracking (no redness or drainage)- this may be from recent diarrhea and wiping Will px triamcinolone cream to use in pm, she will continue abx oint am  Cautioned to wipe gently- avoid harsh detergents and update if symptoms worsen or do not improve         Other Visit Diagnoses    Need for influenza vaccination    -  Primary   Relevant Orders   Flu Vaccine QUAD 36+ mos IM (Completed)

## 2016-05-18 NOTE — Patient Instructions (Signed)
Keep the affected area clean with soap and water  Use antibiotic ointment in the am and use the triamcinolone cream in the evening  Watch for increased redness, swelling, pain or drainage and let me know  Keep me posted   Flu shot today

## 2016-05-18 NOTE — Progress Notes (Signed)
Pre visit review using our clinic review tool, if applicable. No additional management support is needed unless otherwise documented below in the visit note. 

## 2016-07-01 ENCOUNTER — Encounter: Payer: Self-pay | Admitting: Family Medicine

## 2016-07-01 ENCOUNTER — Ambulatory Visit (INDEPENDENT_AMBULATORY_CARE_PROVIDER_SITE_OTHER): Payer: BC Managed Care – PPO | Admitting: Family Medicine

## 2016-07-01 VITALS — BP 110/86 | HR 71 | Temp 98.3°F | Resp 10 | Wt 277.0 lb

## 2016-07-01 DIAGNOSIS — J069 Acute upper respiratory infection, unspecified: Secondary | ICD-10-CM

## 2016-07-01 DIAGNOSIS — B9789 Other viral agents as the cause of diseases classified elsewhere: Secondary | ICD-10-CM

## 2016-07-01 NOTE — Patient Instructions (Signed)
For nasal congestion you can use Afrin nasal spray for 3 days max, Sudafed, saline nasal spray (generic is fine for all). For cough you can try Delsym. Drink enough fluids to make your urine light yellow. For fever/chill/muscle aches you can take over the counter acetaminophen or ibuprofen.  Please come back in if you are not better in 5-7 days or if you develop wheezing, shortness of breath or persistent vomiting.   

## 2016-07-01 NOTE — Progress Notes (Signed)
Pre visit review using our clinic review tool, if applicable. No additional management support is needed unless otherwise documented below in the visit note. 

## 2016-07-01 NOTE — Progress Notes (Signed)
Subjective:    Patient ID: Brittney [name], female    DOB: 1981/01/11, 35 y.o.   MRN: [id number]  HPI This is a 35 yo female who presents today with cough, nasal drainage, nasal congestion, sneezing. Symptoms started 2 days ago. No wheezing. Cough is dry, hears rattles. Nasal drainage is thick but not colored. Ears feel full, no sore throat. Has had chills, but no fever. Mild headache. Has not taken any medications for symptoms. Husband has been sick with similar symptoms. 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   Past Medical History:  Diagnosis Date  . Anemia   . 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 Fibroids   . Irritable bowel syndrome   . Migraine   . Obesity, unspecified    Past Surgical History:  Procedure Laterality Date  . CHOLECYSTECTOMY  05/06/11  . COLONOSCOPY  11/2001   polyp; negative pathology  . COLONOSCOPY  09/2004   Negative  . COLONOSCOPY W/ POLYPECTOMY    . ESOPHAGOGASTRODUODENOSCOPY  1/10   normal  . FLEXIBLE SIGMOIDOSCOPY  03/05/2012   Procedure: FLEXIBLE SIGMOIDOSCOPY;  Surgeon: Inda Castle, MD;  Location: [facility];  Service: Endoscopy;  Laterality: N/A;  . LAPAROSCOPIC DECORTICATION / DUBULKING / ABLATION RENAL CYSTS     saline histogram  . TONSILLECTOMY  07/30/07  . Uterine US  09/2005   Large endometrial stripe; small ovarian cyst   Family History  Problem Relation Age of Onset  . Ulcers Brother   . Clotting disorder Brother   . Colon cancer Maternal Grandmother   . Ovarian cancer Maternal Grandmother   . Diabetes Paternal Grandmother   . Hypertension Paternal Grandmother   . Diabetes Paternal Aunt   . Heart disease Father   . Hypertension Father   . Heart attack Father   . Breast cancer Maternal Aunt     Age 82's  . Diabetes Paternal Aunt     x 3  . Lung cancer Maternal Grandfather     SMOKER   Social History  Substance Use Topics  . Smoking status: Never Smoker  . Smokeless tobacco: Never Used  .  Alcohol use No     Comment: rare      Review of Systems Per HPI    Objective:   Physical Exam  Constitutional: She is oriented to person, place, and time. She appears well-developed and well-nourished. No distress.  Morbidly obese.   HENT:  Head: Normocephalic and atraumatic.  Right Ear: Tympanic membrane, external ear and ear canal normal.  Left Ear: Tympanic membrane, external ear and ear canal normal.  Nose: Mucosal edema present.  Mouth/Throat: Uvula is midline and oropharynx is clear and moist.  Cardiovascular: Normal rate, regular rhythm and normal heart sounds.   Pulmonary/Chest: Effort normal and breath sounds normal.  Neurological: She is alert and oriented to person, place, and time.  Skin: Skin is warm and dry. She is not diaphoretic.  Psychiatric: She has a normal mood and affect. Her behavior is normal. Judgment and thought content normal.  Vitals reviewed.     BP 110/86 (BP Location: Left Arm, Patient Position: Sitting, Cuff Size: Large)   Pulse 71   Temp 98.3 F (36.8 C) (Oral)   Resp 10   Wt 277 lb (125.6 kg)   SpO2 98%   BMI 42.12 kg/m  Wt Readings from Last 3 Encounters:  07/01/16 277 lb (125.6 kg)  05/18/16 280 lb 4 oz (127.1 kg)  02/29/16 280 lb (127 kg)       Assessment & Plan:  1. Viral URI with cough - Discussed likely viral etiology of illness, importance of rest, fluids Patient Instructions  For nasal congestion you can use Afrin nasal spray for 3 days max, Sudafed, saline nasal spray (generic is fine for all). For cough you can try Delsym. Drink enough fluids to make your urine light yellow. For fever/chill/muscle aches you can take over the counter acetaminophen or ibuprofen.  Please come back in if you are not better in 5-7 days or if you develop wheezing, shortness of breath or persistent vomiting.    Clarene Reamer, FNP-BC  [facility]  07/01/2016 3:43 PM

## 2016-08-26 ENCOUNTER — Encounter: Payer: Self-pay | Admitting: Gastroenterology

## 2016-08-29 ENCOUNTER — Encounter: Payer: Self-pay | Admitting: Internal Medicine

## 2016-08-29 ENCOUNTER — Ambulatory Visit (INDEPENDENT_AMBULATORY_CARE_PROVIDER_SITE_OTHER): Payer: BC Managed Care – PPO | Admitting: Internal Medicine

## 2016-08-29 VITALS — BP 110/70 | HR 59 | Temp 98.1°F | Wt 275.0 lb

## 2016-08-29 DIAGNOSIS — H6692 Otitis media, unspecified, left ear: Secondary | ICD-10-CM

## 2016-08-29 DIAGNOSIS — R52 Pain, unspecified: Secondary | ICD-10-CM | POA: Diagnosis not present

## 2016-08-29 DIAGNOSIS — B9789 Other viral agents as the cause of diseases classified elsewhere: Secondary | ICD-10-CM

## 2016-08-29 DIAGNOSIS — J069 Acute upper respiratory infection, unspecified: Secondary | ICD-10-CM | POA: Diagnosis not present

## 2016-08-29 MED ORDER — HYDROCODONE-HOMATROPINE 5-1.5 MG/5ML PO SYRP: 5.0000 mL | ORAL_SOLUTION | Freq: Three times a day (TID) | ORAL | 0 refills | Status: DC | PRN

## 2016-08-29 NOTE — Patient Instructions (Signed)
Cough, Adult Introduction A cough helps to clear your throat and lungs. A cough may last only 2-3 weeks (acute), or it may last longer than 8 weeks (chronic). Many different things can cause a cough. A cough may be a sign of an illness or another medical condition. Follow these instructions at home:  Pay attention to any changes in your cough.  Take medicines only as told by your doctor.  If you were prescribed an antibiotic medicine, take it as told by your doctor. Do not stop taking it even if you start to feel better.  Talk with your doctor before you try using a cough medicine.  Drink enough fluid to keep your pee (urine) clear or pale yellow.  If the air is dry, use a cold steam vaporizer or humidifier in your home.  Stay away from things that make you cough at work or at home.  If your cough is worse at night, try using extra pillows to raise your head up higher while you sleep.  Do not smoke, and try not to be around smoke. If you need help quitting, ask your doctor.  Do not have caffeine.  Do not drink alcohol.  Rest as needed. Contact a doctor if:  You have new problems (symptoms).  You cough up yellow fluid (pus).  Your cough does not get better after 2-3 weeks, or your cough gets worse.  Medicine does not help your cough and you are not sleeping well.  You have pain that gets worse or pain that is not helped with medicine.  You have a fever.  You are losing weight and you do not know why.  You have night sweats. Get help right away if:  You cough up blood.  You have trouble breathing.  Your heartbeat is very fast. This information is not intended to replace advice given to you by your health care provider. Make sure you discuss any questions you have with your health care provider. Document Released: 04/14/2011 Document Revised: 01/07/2016 Document Reviewed: 10/08/2014  2017 Elsevier  

## 2016-08-29 NOTE — Addendum Note (Signed)
Addended by: Jearld Fenton on: 08/29/2016 02:06 PM   Modules accepted: Orders

## 2016-08-29 NOTE — Progress Notes (Signed)
HPI  Pt presents to the clinic today with c/o sore throat and cough. This started 1 week ago. She denies difficulty or painful swallowing. The cough is non productive. She denies runny nose, nasal congestion, fever, chills or shortness of breath. She went to UC last week and was diagnosed with a left ear infection, and was told to take Flonase OTC and was put on Amoxicillin. She was noticing improvement while on the abx and reports she has not finished taking them yet, but all of a sudden last night, she developed severe body aches and she is concerned that she has the flu. She has not taken anything additional OTC. She has no history of allergies or breathing problems. She has not had sick contacts that she is aware of. She has had her flu shot this year.  Review of Systems        Past Medical History:  Diagnosis Date  . Anemia   . 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 Fibroids   . Irritable bowel syndrome   . Migraine   . Obesity, unspecified     Family History  Problem Relation Age of Onset  . Ulcers Brother   . Clotting disorder Brother   . Colon cancer Maternal Grandmother   . Ovarian cancer Maternal Grandmother   . Diabetes Paternal Grandmother   . Hypertension Paternal Grandmother   . Diabetes Paternal Aunt   . Heart disease Father   . Hypertension Father   . Heart attack Father   . Breast cancer Maternal Aunt     Age 98's  . Diabetes Paternal Aunt     x 3  . Lung cancer Maternal Grandfather     SMOKER    Social History   Social History  . Marital status: Married    Spouse name: N/A  . Number of children: 2  . Years of education: N/A   Occupational History  . Teacher    Social History Main Topics  . Smoking status: Never Smoker  . Smokeless tobacco: Never Used  . Alcohol use No     Comment: rare  . Drug use: No  . Sexual activity: Yes    Birth control/ protection: Condom, IUD   Other Topics Concern  . 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 Use-soda          Allergies  Allergen Reactions  . Lansoprazole     REACTION: rash     Constitutional: Denies headache, fatigue, fever or abrupt weight changes.  HEENT:  Positive sore throat. Denies eye redness, eye pain, pressure behind the eyes, facial pain, nasal congestion, ear pain, ringing in the ears, wax buildup, runny nose or bloody nose. Respiratory: Positive cough. Denies difficulty breathing or shortness of breath.  Cardiovascular: Denies chest pain, chest tightness, palpitations or swelling in the hands or feet.   No other specific complaints in a complete review of systems (except as listed in HPI above).  Objective:   BP 110/70   Pulse (!) 59   Temp 98.1 F (36.7 C) (Oral)   Wt 275 lb (124.7 kg)   SpO2 98%   BMI 41.81 kg/m   Wt Readings from Last 3 Encounters:  08/29/16 275 lb (124.7 kg)  07/01/16 277 lb (125.6 kg)  05/18/16 280 lb 4 oz (127.1 kg)     General: Appears her stated age,  obese in NAD. HEENT: Head: normal shape and size, no sinus tenderness noted; Eyes: sclera white, no icterus, conjunctiva pink; Ears: Tm's gray and intact, normal light reflex; Nose: mucosa pink and moist, septum midline; Throat/Mouth: Teeth present, mucosa pink and moist, no exudate noted, no lesions or ulcerations noted.  Neck: No cervical lymphadenopathy.  Cardiovascular: Normal rate and rhythm. S1,S2 noted.  No murmur, rubs or gallops noted.  Pulmonary/Chest: Normal effort and positive vesicular breath sounds. No respiratory distress. No wheezes, rales or ronchi noted.       Assessment & Plan:    Viral Upper Respiratory Infection with Cough and Body Aches:  Rapid Flu: negative Get some rest and drink plenty of water Do salt water gargles for the sore throat Ibuprofen as needed for pain and inflammation Delsym OTC as needed for cough  Left Otitis Media:  Ear exam normal Complete Amoxil as prescribed  RTC as  needed or if symptoms persist.   Webb Silversmith, NP

## 2016-09-05 ENCOUNTER — Encounter: Payer: Self-pay | Admitting: Gastroenterology

## 2016-09-14 ENCOUNTER — Telehealth: Payer: Self-pay | Admitting: Gastroenterology

## 2016-09-14 NOTE — Telephone Encounter (Signed)
Patient with rectal bleeding for one week.  She will come in and see Dr. Silverio Decamp on Friday at 2:00

## 2016-09-16 ENCOUNTER — Emergency Department (HOSPITAL_COMMUNITY)
Admission: EM | Admit: 2016-09-16 | Discharge: 2016-09-16 | Disposition: A | Discharge status: Home / Self Care | Payer: BC Managed Care – PPO | Attending: Emergency Medicine | Admitting: Emergency Medicine

## 2016-09-16 ENCOUNTER — Ambulatory Visit: Payer: BC Managed Care – PPO | Admitting: Gastroenterology

## 2016-09-16 ENCOUNTER — Encounter (HOSPITAL_COMMUNITY): Payer: Self-pay | Admitting: Emergency Medicine

## 2016-09-16 DIAGNOSIS — Z79899 Other long term (current) drug therapy: Secondary | ICD-10-CM | POA: Diagnosis not present

## 2016-09-16 DIAGNOSIS — K2960 Other gastritis without bleeding: Secondary | ICD-10-CM | POA: Insufficient documentation

## 2016-09-16 DIAGNOSIS — K921 Melena: Secondary | ICD-10-CM | POA: Insufficient documentation

## 2016-09-16 DIAGNOSIS — K2900 Acute gastritis without bleeding: Secondary | ICD-10-CM

## 2016-09-16 DIAGNOSIS — R109 Unspecified abdominal pain: Secondary | ICD-10-CM | POA: Diagnosis present

## 2016-09-16 LAB — COMPREHENSIVE METABOLIC PANEL
ALBUMIN: 4 g/dL (ref 3.5–5.0)
ALK PHOS: 68 U/L (ref 38–126)
ALT: 18 U/L (ref 14–54)
ANION GAP: 7 (ref 5–15)
AST: 18 U/L (ref 15–41)
BUN: 13 mg/dL (ref 6–20)
CO2: 23 mmol/L (ref 22–32)
Calcium: 9 mg/dL (ref 8.9–10.3)
Chloride: 108 mmol/L (ref 101–111)
Creatinine, Ser: 0.95 mg/dL (ref 0.44–1.00)
GFR calc Af Amer: >60 mL/min (ref >60)
GFR calc non Af Amer: >60 mL/min (ref >60)
GLUCOSE: 91 mg/dL (ref 65–99)
POTASSIUM: 4 mmol/L (ref 3.5–5.1)
SODIUM: 138 mmol/L (ref 135–145)
Total Bilirubin: 0.3 mg/dL (ref 0.3–1.2)
Total Protein: 7.2 g/dL (ref 6.5–8.1)

## 2016-09-16 LAB — URINALYSIS, ROUTINE W REFLEX MICROSCOPIC
Bacteria, UA: NONE SEEN
Bilirubin Urine: NEGATIVE
GLUCOSE, UA: NEGATIVE mg/dL
KETONES UR: NEGATIVE mg/dL
LEUKOCYTES UA: NEGATIVE
Nitrite: NEGATIVE
PH: 9 — AB (ref 5.0–8.0)
Protein, ur: NEGATIVE mg/dL
Specific Gravity, Urine: 1.016 (ref 1.005–1.030)

## 2016-09-16 LAB — CBC
HEMATOCRIT: 38 % (ref 36.0–46.0)
HEMOGLOBIN: 12.4 g/dL (ref 12.0–15.0)
MCH: 25.8 pg — AB (ref 26.0–34.0)
MCHC: 32.6 g/dL (ref 30.0–36.0)
MCV: 79.2 fL (ref 78.0–100.0)
Platelets: 258 10*3/uL (ref 150–400)
RBC: 4.8 MIL/uL (ref 3.87–5.11)
RDW: 15.9 % — ABNORMAL HIGH (ref 11.5–15.5)
WBC: 8.7 10*3/uL (ref 4.0–10.5)

## 2016-09-16 LAB — PREGNANCY, URINE: Preg Test, Ur: NEGATIVE

## 2016-09-16 LAB — TYPE AND SCREEN
ABO/RH(D): O POS
Antibody Screen: NEGATIVE

## 2016-09-16 LAB — LIPASE, BLOOD: LIPASE: 26 U/L (ref 11–51)

## 2016-09-16 MED ORDER — ONDANSETRON 4 MG PO TBDP: 4.0000 mg | ORAL_TABLET | Freq: Once | ORAL | Status: DC | PRN

## 2016-09-16 MED ORDER — ONDANSETRON HCL 4 MG PO TABS: 4.0000 mg | ORAL_TABLET | Freq: Four times a day (QID) | ORAL | 0 refills | Status: DC

## 2016-09-16 MED ORDER — FAMOTIDINE 20 MG PO TABS
20.0000 mg | ORAL_TABLET | Freq: Once | ORAL | Status: AC
Start: 2016-09-16 — End: 2016-09-16
  Administered 2016-09-16: 20 mg via ORAL
  Filled 2016-09-16: qty 1

## 2016-09-16 MED ORDER — GI COCKTAIL ~~LOC~~
30.0000 mL | Freq: Once | ORAL | Status: AC
  Administered 2016-09-16: 30 mL via ORAL
  Filled 2016-09-16: qty 30

## 2016-09-16 MED ORDER — FAMOTIDINE 20 MG PO TABS: 20.0000 mg | ORAL_TABLET | Freq: Two times a day (BID) | ORAL | 0 refills | Status: DC

## 2016-09-16 MED ORDER — SODIUM CHLORIDE 0.9 % IV BOLUS (SEPSIS)
1000.0000 mL | Freq: Once | INTRAVENOUS | Status: AC
  Administered 2016-09-16: 1000 mL via INTRAVENOUS

## 2016-09-16 NOTE — ED Triage Notes (Signed)
Patient is complaining of rectal bleeding, vomiting, nausea, and upper left quad abdominal pain. Patient stated the abdominal pain started Sep 08, 2016. Patient states the abdominal pain started yesterday. Nausea and vomiting started today. 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 The nurse at her school told her that her blood pressure was low.

## 2016-09-16 NOTE — ED Provider Notes (Signed)
[facility] DEPT Provider Note   CSN: [id number] Arrival date & time: 09/16/16  1912     History   Chief Complaint Chief Complaint  Patient presents with  . Nausea  . Emesis  . Rectal Bleeding  . Abdominal Pain    HPI Brittney [name] is a 36 y.o. female.  The history is provided by the patient. No language interpreter was used.  Emesis   Associated symptoms include abdominal pain.  Rectal Bleeding  Associated symptoms: abdominal pain and vomiting   Abdominal Pain   Associated symptoms include hematochezia and vomiting.    Brittney [name] is a 36 y.o. female who presents to the Emergency Department complaining of vomiting, dizziness, rectal bleeding.  She reports 1.5 weeks of BRBPR.  For two days she has experienced intermittent dizziness and had to leave work.  She has experienced intermittent epigastric abdominal pain radiating to her back starting yesterday with vomiting today.  Denies fevers.  She has a mild cough.    Past Medical History:  Diagnosis Date  . Anemia   . 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 Fibroids   . Irritable bowel syndrome   . Migraine   . Obesity, unspecified     Patient Active Problem List   Diagnosis Date Noted  . Rectal irritation 05/18/2016  . Gastritis 03/02/2016  . Contusion 02/19/2016  . Fibrocystic breast changes 01/08/2016  . Breast lump on right side at 5 o'clock position 01/08/2016  . Hemorrhoids, external 09/30/2014  . Chronic diarrhea 08/29/2014  . 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[place]) 07/04/2014  . Hemorrhoids, internal, with bleeding 04/22/2014  . Family history of pulmonary embolism 09/02/2013  . Family history of diabetes mellitus 09/02/2013  . Cutaneous skin tags 05/15/2013  . Sebaceous cyst of left axilla 05/15/2013  . DUB (dysfunctional uterine bleeding) 09/24/2012  . Endometritis 09/24/2012  . 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 EDEMA 11/04/2009  . OBESITY 03/31/2008  . HEARING LOSS, MILD 03/31/2008  .  MIGRAINE HEADACHE 02/20/2007  . IBS 02/20/2007  . OSTEOARTHRITIS 02/20/2007    Past Surgical History:  Procedure Laterality Date  . CHOLECYSTECTOMY  05/06/11  . COLONOSCOPY  11/2001   polyp; negative pathology  . COLONOSCOPY  09/2004   Negative  . COLONOSCOPY W/ POLYPECTOMY    . ESOPHAGOGASTRODUODENOSCOPY  1/10   normal  . FLEXIBLE SIGMOIDOSCOPY  03/05/2012   Procedure: FLEXIBLE SIGMOIDOSCOPY;  Surgeon: Inda Castle, MD;  Location: [facility] ENDOSCOPY;  Service: Endoscopy;  Laterality: N/A;  . LAPAROSCOPIC DECORTICATION / DUBULKING / ABLATION RENAL CYSTS     saline histogram  . TONSILLECTOMY  07/30/07  . Uterine US  09/2005   Large endometrial stripe; small ovarian cyst    OB History    Gravida Para Term Preterm AB Living   2 2 2  0 0 2   SAB TAB Ectopic Multiple Live Births   0 0 0 0 1       Home Medications    Prior to Admission medications   Medication Sig Start Date End Date Taking? Authorizing Provider  HYDROcodone-homatropine (HYCODAN) 5-1.5 MG/5ML syrup Take 5 mLs by mouth every 8 (eight) hours as needed for cough. 08/29/16  Yes Jearld Fenton, NP  levonorgestrel (MIRENA) 20 MCG/24HR IUD 1 each by Intrauterine route once.   Yes Historical Provider, MD  nitroGLYCERIN (NITROSTAT) 0.4 MG SL tablet Place 1 tablet (0.4 mg total) under the tongue every 5 (five) minutes as needed for chest pain. 07/03/14  Yes Wellington Hampshire, MD  cyclobenzaprine (FLEXERIL) 10 MG tablet Take 1 tablet (10 mg total) by mouth every 8 (eight) hours as needed for muscle spasms. Patient not taking: Reported on 09/16/2016 10/29/15   Lisa Roca, MD  famotidine (PEPCID) 20 MG tablet Take 1 tablet (20 mg total) by mouth 2 (two) times daily. 09/16/16   Quintella Reichert, MD  ondansetron (ZOFRAN) 4 MG tablet Take 1 tablet (4 mg total) by mouth every 6 (six) hours. 09/16/16   Quintella Reichert, MD  ranitidine (ZANTAC) 150 MG tablet Take 1 tablet (150 mg total) by mouth at bedtime. Patient not taking: Reported on 09/16/2016  02/29/16 02/28/17  Nance Pear, MD  sucralfate (CARAFATE) 1 g tablet Take 1 tablet (1 g total) by mouth 4 (four) times daily. Patient not taking: Reported on 09/16/2016 02/29/16   Nance Pear, MD    Family History Family History  Problem Relation Age of Onset  . Heart disease Father   . Hypertension Father   . Heart attack Father   . Ulcers Brother   . Clotting disorder Brother   . Colon cancer Maternal Grandmother   . Ovarian cancer Maternal Grandmother   . Diabetes Paternal Grandmother   . Hypertension Paternal Grandmother   . Diabetes Paternal Aunt   . Breast cancer Maternal Aunt     Age 76's  . Diabetes Paternal Aunt     x 3  . Lung cancer Maternal Grandfather     SMOKER    Social History Social History  Substance Use Topics  . Smoking status: Never Smoker  . Smokeless tobacco: Never Used  . Alcohol use No     Comment: rare     Allergies   Lansoprazole   Review of Systems Review of Systems  Gastrointestinal: Positive for abdominal pain, hematochezia and vomiting.  All other systems reviewed and are negative.    Physical Exam Updated Vital Signs BP 107/98   Pulse (!) 52   Temp 98 F (36.7 C) (Oral)   Resp 17   Ht 5\' 8"  (1.727 m)   Wt 274 lb (124.3 kg)   SpO2 100%   BMI 41.66 kg/m   Physical Exam  Constitutional: She is oriented to person, place, and time. She appears well-developed and well-nourished.  HENT:  Head: Normocephalic and atraumatic.  Cardiovascular: Normal rate and regular rhythm.   No murmur heard. Pulmonary/Chest: Effort normal and breath sounds normal. No respiratory distress.  Abdominal: Soft. There is no rebound and no guarding.  Mild epigastric and LUQ tenderness  Genitourinary:  Genitourinary Comments: Small external hemorrhoid with no active bleeding.   Musculoskeletal: She exhibits no edema or tenderness.  Neurological: She is alert and oriented to person, place, and time.  Skin: Skin is warm and dry.  Psychiatric:  She has a normal mood and affect. Her behavior is normal.  Nursing note and vitals reviewed.    ED Treatments / Results  Labs (all labs ordered are listed, but only abnormal results are displayed) Labs Reviewed  CBC - Abnormal; Notable for the following:       Result Value   MCH 25.8 (*)    RDW 15.9 (*)    All other components within normal limits  URINALYSIS, ROUTINE W REFLEX MICROSCOPIC - Abnormal; Notable for the following:    pH 9.0 (*)    Hgb urine dipstick SMALL (*)    Squamous Epithelial / LPF 0-5 (*)    All other components within normal limits  COMPREHENSIVE METABOLIC PANEL  LIPASE, BLOOD  PREGNANCY,  URINE  POC OCCULT BLOOD, ED  TYPE AND SCREEN  ABO/RH    EKG  EKG Interpretation None       Radiology No results found.  Procedures Procedures (including critical care time)  Medications Ordered in ED Medications  ondansetron (ZOFRAN-ODT) disintegrating tablet 4 mg (not administered)  sodium chloride 0.9 % bolus 1,000 mL (0 mLs Intravenous Stopped 09/16/16 2216)  gi cocktail (Maalox,Lidocaine,Donnatal) (30 mLs Oral Given 09/16/16 2125)  famotidine (PEPCID) tablet 20 mg (20 mg Oral Given 09/16/16 2125)     Initial Impression / Assessment and Plan / ED Course  I have reviewed the triage vital signs and the nursing notes.  Pertinent labs & imaging results that were available during my care of the patient were reviewed by me and considered in my medical decision making (see chart for details).   Patient with history of IBS here with a week of progressive blood per rectum as well as 1 day of epigastric pain and vomiting. She is well appearing on examination with no peritoneal findings. Hemoglobin is within normal limits and she has no active bleeding on examination. Presentation is not consistent with perforated discus, massive GI bleed, cholecystitis. Counseled patient on home care for likely gastritis as well as hematochezia. She has a gastroenterologist and  colonoscopy in the next month. Discussed outpatient follow-up as well as close return precautions.  Final Clinical Impressions(s) / ED Diagnoses   Final diagnoses:  Other acute gastritis without hemorrhage  Hematochezia    New Prescriptions Discharge Medication List as of 09/16/2016 10:16 PM    START taking these medications   Details  famotidine (PEPCID) 20 MG tablet Take 1 tablet (20 mg total) by mouth 2 (two) times daily., Starting Fri 09/16/2016, Print    ondansetron (ZOFRAN) 4 MG tablet Take 1 tablet (4 mg total) by mouth every 6 (six) hours., Starting Fri 09/16/2016, Print         Quintella Reichert, MD 09/17/16 [phone]

## 2016-09-17 LAB — ABO/RH: ABO/RH(D): O POS

## 2016-09-18 IMAGING — CR DG CHEST 2V
2 series · 3 of 3 positions shown · non-contrast
Comparison: 10/29/2015

CLINICAL DATA: Left chest pain radiating to back x1 week

EXAM:
CHEST  2 VIEW

[chest pa]
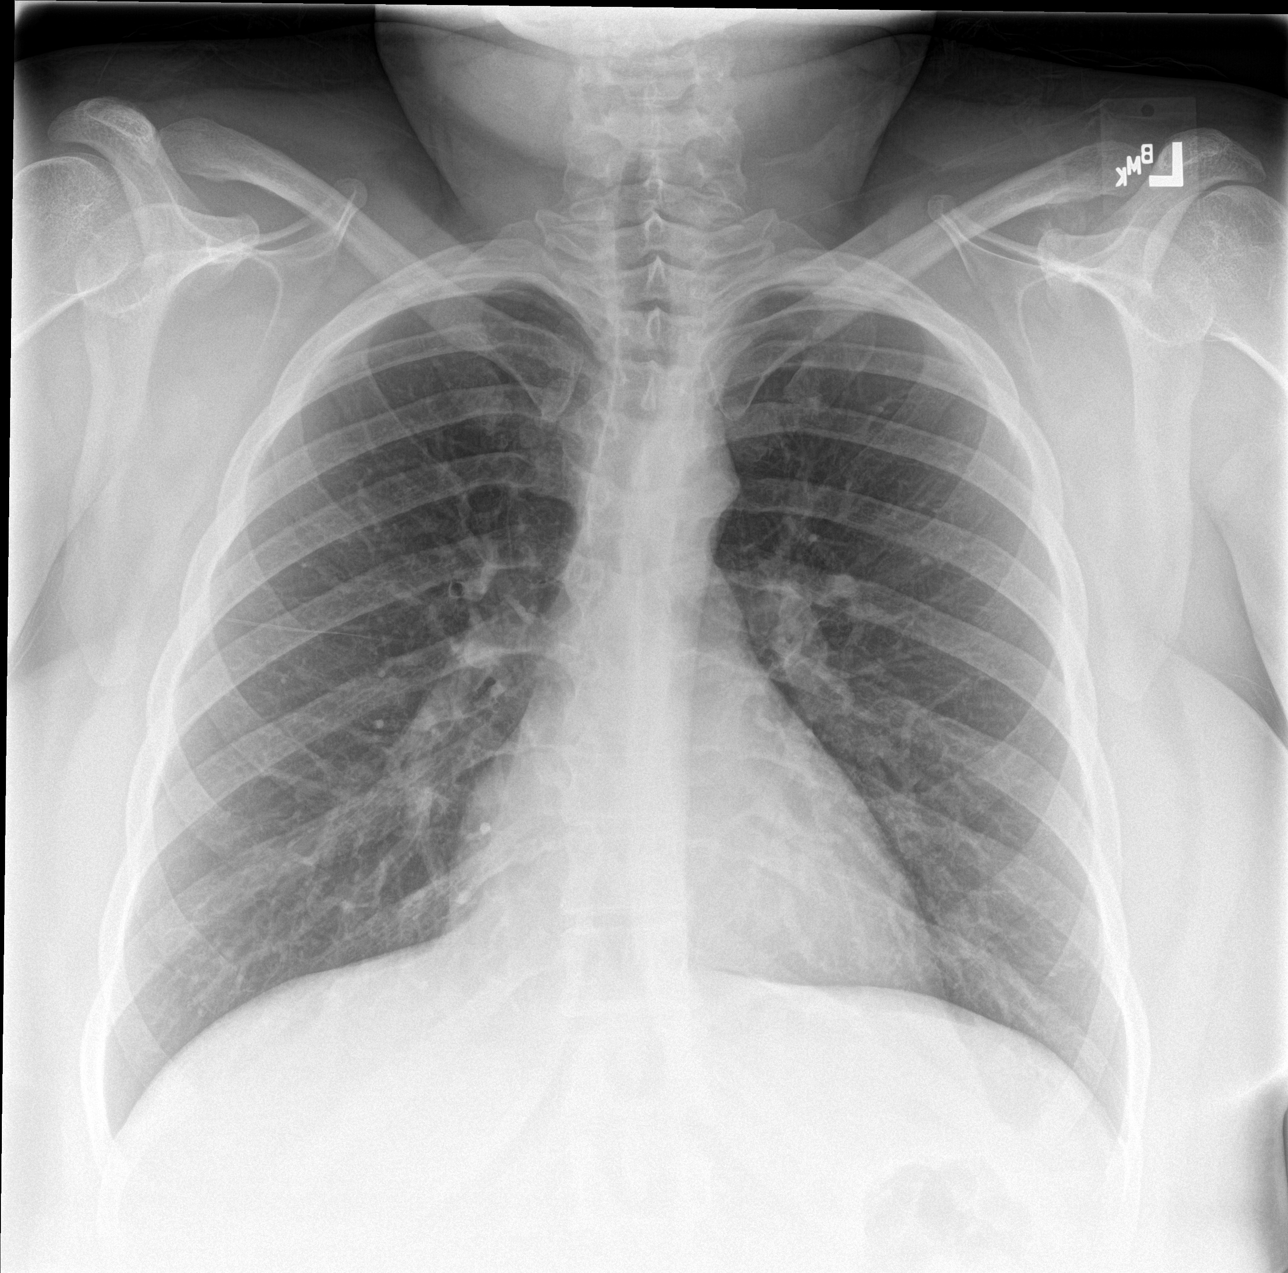

[Series 2: chest lat · 0.14mm/px · 2 of 2 slices shown]
[im 1/2]
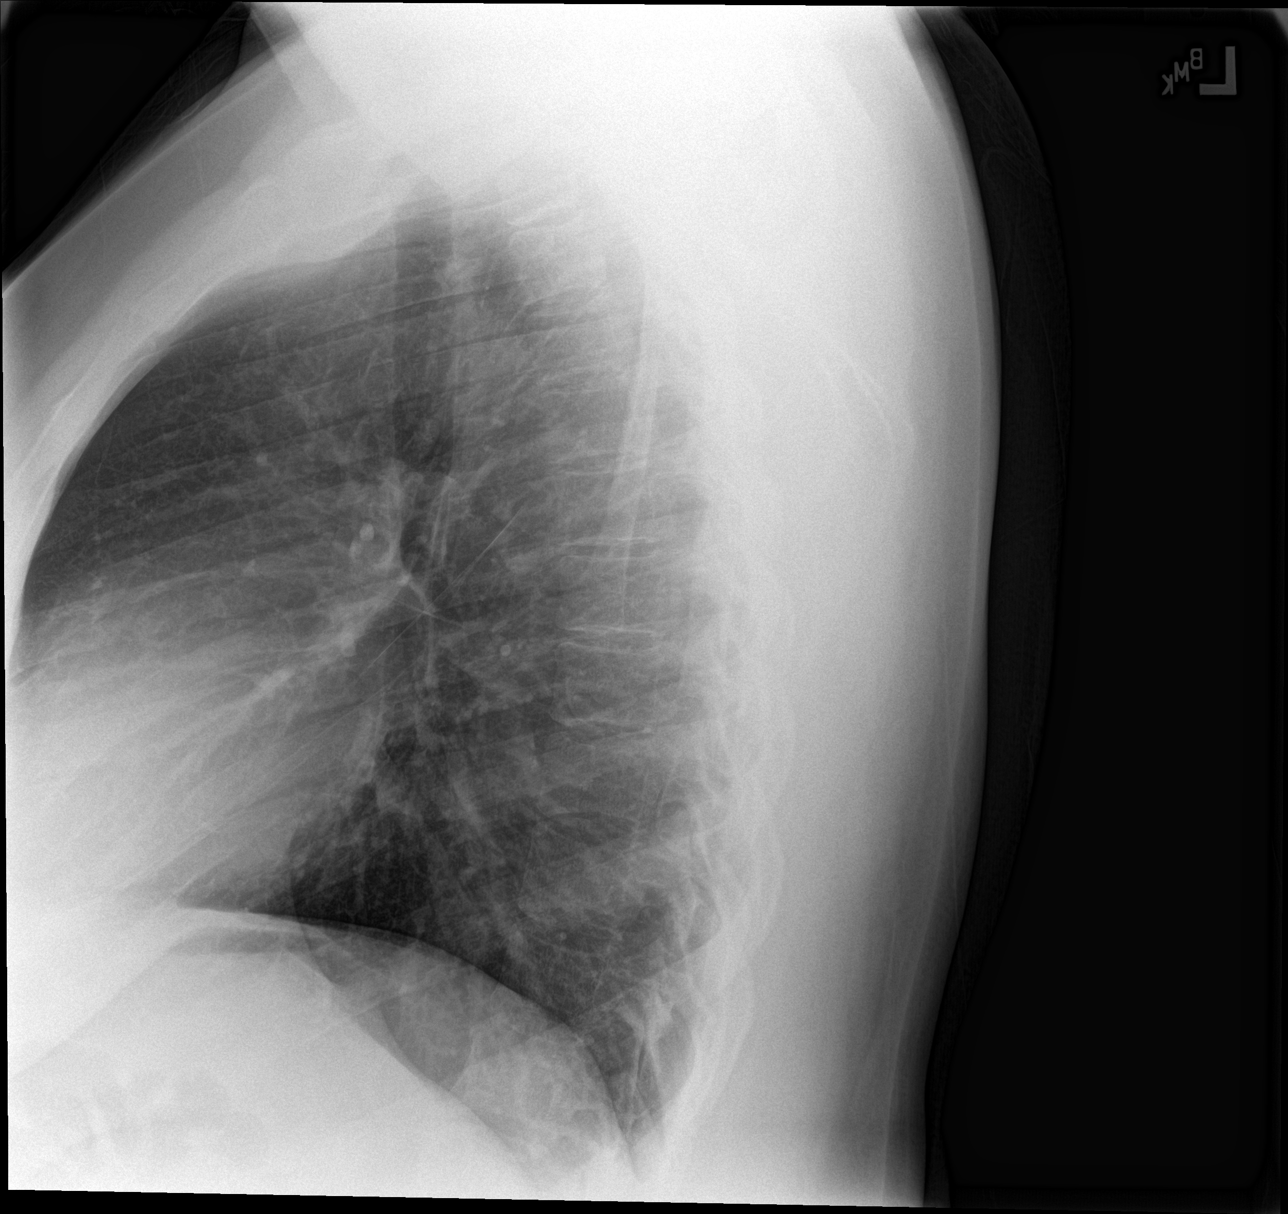
[im 2/2]
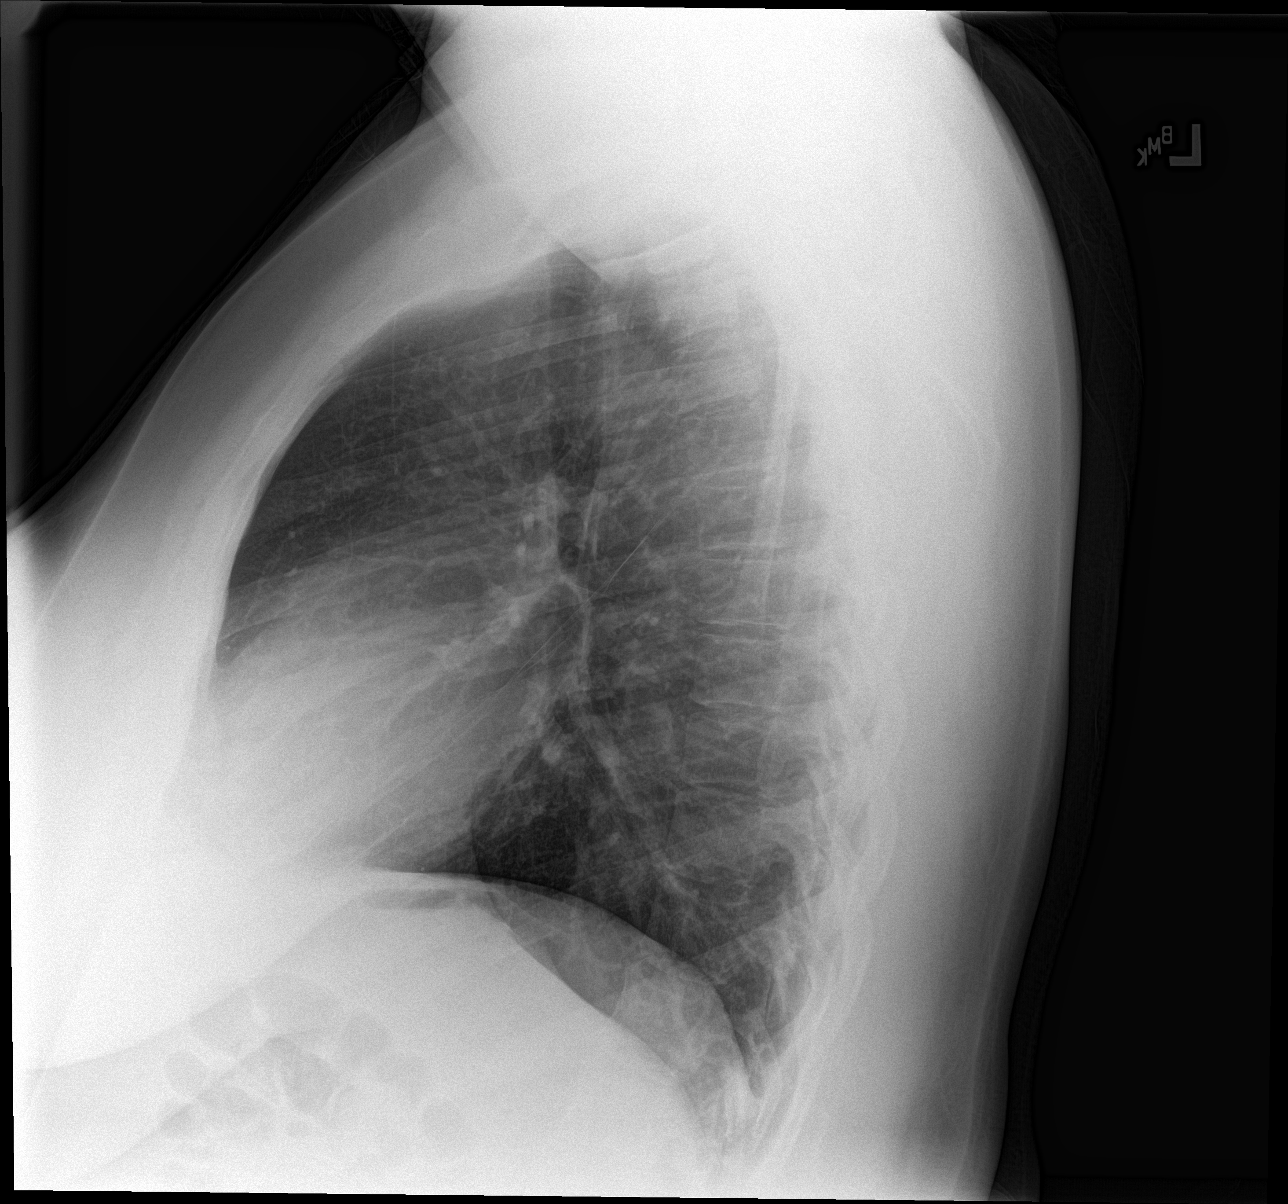

[3 of 3 positions shown; findings below may reference images not displayed]

FINDINGS: Lungs are clear.  No pleural effusion or pneumothorax.

The heart is normal in size.

Visualized osseous structures are within normal limits.
IMPRESSION: Normal chest radiographs.

## 2016-09-20 ENCOUNTER — Encounter: Payer: Self-pay | Admitting: Gastroenterology

## 2016-09-20 ENCOUNTER — Other Ambulatory Visit (INDEPENDENT_AMBULATORY_CARE_PROVIDER_SITE_OTHER): Payer: BC Managed Care – PPO

## 2016-09-20 ENCOUNTER — Ambulatory Visit: Payer: BC Managed Care – PPO | Admitting: Gastroenterology

## 2016-09-20 ENCOUNTER — Ambulatory Visit (INDEPENDENT_AMBULATORY_CARE_PROVIDER_SITE_OTHER): Payer: BC Managed Care – PPO | Admitting: Gastroenterology

## 2016-09-20 VITALS — BP 122/70 | HR 76 | Ht 66.25 in | Wt 277.2 lb

## 2016-09-20 DIAGNOSIS — K625 Hemorrhage of anus and rectum: Secondary | ICD-10-CM | POA: Diagnosis not present

## 2016-09-20 DIAGNOSIS — K641 Second degree hemorrhoids: Secondary | ICD-10-CM

## 2016-09-20 DIAGNOSIS — K580 Irritable bowel syndrome with diarrhea: Secondary | ICD-10-CM

## 2016-09-20 LAB — VITAMIN B12: VITAMIN B 12: 429 pg/mL (ref 211–911)

## 2016-09-20 LAB — IBC PANEL
IRON: 152 ug/dL — AB (ref 42–145)
Saturation Ratios: 32.6 % (ref 20.0–50.0)
Transferrin: 333 mg/dL (ref 212.0–360.0)

## 2016-09-20 LAB — FERRITIN: FERRITIN: 6.7 ng/mL — AB (ref 10.0–291.0)

## 2016-09-20 LAB — FOLATE: FOLATE: 11.4 ng/mL (ref >5.9)

## 2016-09-20 MED ORDER — CHOLESTYRAMINE 4 G PO PACK: 4.0000 g | PACK | Freq: Three times a day (TID) | ORAL | 12 refills | Status: DC

## 2016-09-20 MED ORDER — HYDROCORTISONE ACETATE 25 MG RE SUPP: 25.0000 mg | Freq: Every day | RECTAL | 1 refills | Status: DC

## 2016-09-20 MED ORDER — FUSION PLUS PO CAPS: 1.0000 | ORAL_CAPSULE | Freq: Every day | ORAL | 3 refills | Status: DC

## 2016-09-20 NOTE — Patient Instructions (Addendum)
If you are age 38 or older, your body mass index should be between 23-30. Your Body mass index is 44.41 kg/m. If this is out of the aforementioned range listed, please consider follow up with your Primary Care Provider.  If you are age 21 or younger, your body mass index should be between 19-25. Your Body mass index is 44.41 kg/m. If this is out of the aformentioned range listed, please consider follow up with your Primary Care Provider.    We have sent your medications to your pharmacy for you to pick up at your convenience.  Lactose-Free Diet, Adult Introduction If you have lactose intolerance, you are not able to digest lactose. Lactose is a natural sugar found mainly in milk and milk products. You may need to avoid all foods and beverages that contain lactose. A lactose-free diet can help you do this. What do I need to know about this diet?  Do not consume foods, beverages, vitamins, minerals, or medicines with lactose. Read ingredients lists carefully.  Look for the words "lactose-free" on labels.  Use lactase enzyme drops or tablets as directed by your health care provider.  Use lactose-free milk or a milk alternative, such as soy milk, for drinking and cooking.  Make sure you get enough calcium and vitamin D in your diet. A lactose-free eating plan can be lacking in these important nutrients.  Take calcium and vitamin D supplements as directed by your health care provider. Talk to your provider about supplements if you are not able to get enough calcium and vitamin D from food. Which foods have lactose? Lactose is found in:  Milk and foods made from milk.  Yogurt.  Cheese.  Butter.  Margarine.  Sour cream.  Cream.  Whipped toppings and nondairy creamers.  Ice cream and other milk-based desserts. Lactose is also found in foods or products made with milk or milk ingredients. To find out whether a food contains milk or a milk ingredient, look at the ingredients list.  Avoid foods with the statement "May contain milk" and foods that contain:  Butter.  Cream.  Milk.  Milk solids.  Milk powder.  Whey.  Curd.  Caseinate.  Lactose.  Lactalbumin.  Lactoglobulin. What are some alternatives to milk and foods made with milk products?  Lactose-free milk.  Soy milk with added calcium and vitamin D.  Almond, coconut, or rice milk with added calcium and vitamin D. Note that these are low in protein.  Soy products, such as soy yogurt, soy cheese, soy ice cream, and soy-based sour cream. Which foods can I eat? Grains  Breads and rolls made without milk, such as [place], [place], or [place] bread, bagels, pita, and [employer]. Corn tortillas, corn meal, grits, and polenta. Crackers without lactose or milk solids, such as soda crackers and graham crackers. Cooked or dry cereals without lactose or milk solids. Pasta, quinoa, couscous, barley, oats, bulgur, farro, rice, wild rice, or other grains prepared without milk or lactose. Plain popcorn. Vegetables  Fresh, frozen, and canned vegetables without cheese, cream, or butter sauces. Fruits  All fresh, canned, frozen, or dried fruits that are not processed with lactose. Meats and Other Protein Sources  Plain beef, chicken, fish, [place], lamb, veal, pork, wild game, or ham. Kosher-prepared meat products. Strained or junior meats that do not contain milk. Eggs. Soy meat substitutes. Beans, lentils, and hummus. Tofu. Nuts and seeds. Peanut or other nut butters without lactose. Soups, casseroles, and mixed dishes without cheese, cream, or milk. Dairy  Lactose-free milk. Soy, rice, or almond milk with added calcium and vitamin D. Soy cheese and yogurt. Beverages  Carbonated drinks. Tea. Coffee, freeze-dried coffee, and some instant coffees. Fruit and vegetable juices. Condiments  Soy sauce. Carob powder. Olives. Gravy made with water. Baker's cocoa. Angie Fava. Pure seasonings and spices. Ketchup. Mustard. Bouillon.  Broth. Sweets and Desserts  Water and fruit ices. Gelatin. Cookies, pies, or cakes made from allowed ingredients, such as angel food cake. Pudding made with water or a milk substitute. Lactose-free tofu desserts. Soy, coconut milk, or rice-milk-based frozen desserts. Sugar. Honey. Jam, jelly, and marmalade. Molasses. Pure sugar candy. Dark chocolate without milk. Marshmallows. Fats and Oils  Margarines and salad dressings that do not contain milk. Berniece Salines. Vegetable oils. Shortening. Mayonnaise. Soy or coconut-based cream. The items listed above may not be a complete list of recommended foods or beverages. Contact your dietitian for more options.  Which foods are not recommended? Grains  Breads and rolls that contain milk. Toaster pastries. Muffins, biscuits, waffles, cornbread, and pancakes. These can be prepared at home, commercial, or from mixes. Sweet rolls, donuts, English muffins, fry bread, lefse, flour tortillas with lactose, or [place] toast made with milk or milk ingredients. Crackers that contain lactose. Corn curls. Cooked or dry cereals with lactose. Vegetables  Creamed or breaded vegetables. Vegetables in a cheese or butter sauce or with lactose-containing margarines. Instant potatoes. [place] fries. Scalloped or au gratin potatoes. Fruits  None. Meats and Other Protein Sources  Scrambled eggs, omelets, and souffles that contain milk. Creamed or breaded meat, fish, chicken, or [place]. Sausage products, such as wieners and liver sausage. Cold cuts that contain milk solids. Cheese, cottage cheese, ricotta cheese, and cheese spreads. Lasagna and macaroni and cheese. Pizza. Peanut or other nut butters with added milk solids. Casseroles or mixed dishes containing milk or cheese. Dairy  All dairy products, including milk, goat's milk, buttermilk, kefir, acidophilus milk, flavored milk, evaporated milk, condensed milk, dul[place], eggnog, yogurt, cheese, and cheese spreads. Beverages  Hot  chocolate. Cocoa with lactose. Instant iced teas. Powdered fruit drinks. Smoothies made with milk or yogurt. Condiments  Chewing gum that has lactose. Cocoa that has lactose. Spice blends if they contain milk products. Artificial sweeteners that contain lactose. Nondairy creamers. Sweets and Desserts  Ice cream, ice milk, gelato, sherbet, and frozen yogurt. Custard, pudding, and mousse. Cake, cream pies, cookies, and other desserts containing milk, cream, cream cheese, or milk chocolate. Pie crust made with milk-containing margarine or butter. Reduced-calorie desserts made with a sugar substitute that contains lactose. Toffee and butterscotch. Milk, white, or dark chocolate that contains milk. Fudge. Caramel. Fats and Oils  Margarines and salad dressings that contain milk or cheese. Cream. Half and half. Cream cheese. Sour cream. Chip dips made with sour cream or yogurt. The items listed above may not be a complete list of foods and beverages to avoid. Contact your dietitian for more information.  Am I getting enough calcium? Calcium is found in many foods that contain lactose and is important for bone health. The amount of calcium you need depends on your age:  Adults younger than 50 years: 1000 mg of calcium a day.  Adults older than 50 years: 1200 mg of calcium a day. If you are not getting enough calcium, other calcium sources include:  Orange juice with calcium added. There are 300-350 mg of calcium in 1 cup of orange juice.  Sardines with edible bones. There are 325 mg of calcium in 3 oz  of sardines.  Calcium-fortified soy milk. There are 300-400 mg of calcium in 1 cup of calcium-fortified soy milk.  Calcium-fortified rice or almond milk. There are 300 mg of calcium in 1 cup of calcium-fortified rice or almond milk.  Canned salmon with edible bones. There are 180 mg of calcium in 3 oz of canned salmon with edible bones.  Calcium-fortified breakfast cereals. There are [phone] mg of  calcium in calcium-fortified breakfast cereals.  Tofu set with calcium sulfate. There are 250 mg of calcium in  cup of tofu set with calcium sulfate.  Spinach, cooked. There are 145 mg of calcium in  cup of cooked spinach.  Edamame, cooked. There are 130 mg of calcium in  cup of cooked edamame.  Collard greens, cooked. There are 125 mg of calcium in  cup of cooked collard greens.  Kale, frozen or cooked. There are 90 mg of calcium in  cup of cooked or frozen kale.  Almonds. There are 95 mg of calcium in  cup of almonds.  Broccoli, cooked. There are 60 mg of calcium in 1 cup of cooked broccoli. This information is not intended to replace advice given to you by your health care provider. Make sure you discuss any questions you have with your health care provider. Document Released: 01/21/2002 Document Revised: 01/07/2016 Document Reviewed: 11/01/2013  2017 [employer]  Please go to the basement today before leaving to have blood work drawn.

## 2016-09-20 NOTE — Progress Notes (Signed)
Brittney [name]    [id number]    01-19-1981  Primary Care Physician:Marne Tower, MD  Referring Physician: Abner Greenspan, MD [address] [place] , [place] [address]  Chief complaint:  Rectal bleeding  HPI: 37 yr F with history of irritable bowel syndrome previously followed by Dr Deatra Ina, was last seen in office on 06/26/14. This here with complaints of intermittent rectal bleeding, he has had on and off bleeding for many years now. She has had multiple colonoscopies in February 2013, January 2006, April 2003 and flexible sigmoidoscopy in July 2013. History recent episode started last month and she was having almost daily bright red blood per rectum with every bowel movement for 2 weeks . On review of systems patient complaining of intermittent diarrhea with increased bowel frequency 4-5 episodes a day, semi-formed to formed stool . Denies any nocturnal symptoms . Most of his time she has to have a bowel movement postprandial . No nausea, vomiting, dysphagia, odynophagia, constipation or melena .   Outpatient Encounter Prescriptions as of 09/20/2016  Medication Sig  . levonorgestrel (MIRENA) 20 MCG/24HR IUD 1 each by Intrauterine route once.  . nitroGLYCERIN (NITROSTAT) 0.4 MG SL tablet Place 1 tablet (0.4 mg total) under the tongue every 5 (five) minutes as needed for chest pain. (Patient not taking: Reported on 09/20/2016)  . [DISCONTINUED] cyclobenzaprine (FLEXERIL) 10 MG tablet Take 1 tablet (10 mg total) by mouth every 8 (eight) hours as needed for muscle spasms. (Patient not taking: Reported on 09/16/2016)  . [DISCONTINUED] famotidine (PEPCID) 20 MG tablet Take 1 tablet (20 mg total) by mouth 2 (two) times daily.  . [DISCONTINUED] HYDROcodone-homatropine (HYCODAN) 5-1.5 MG/5ML syrup Take 5 mLs by mouth every 8 (eight) hours as needed for cough.  . [DISCONTINUED] ondansetron (ZOFRAN) 4 MG tablet Take 1 tablet (4 mg total) by mouth every 6 (six) hours.  . [DISCONTINUED]  ranitidine (ZANTAC) 150 MG tablet Take 1 tablet (150 mg total) by mouth at bedtime. (Patient not taking: Reported on 09/16/2016)  . [DISCONTINUED] sucralfate (CARAFATE) 1 g tablet Take 1 tablet (1 g total) by mouth 4 (four) times daily. (Patient not taking: Reported on 09/16/2016)   No facility-administered encounter medications on file as of 09/20/2016.     Allergies as of 09/20/2016 - Review Complete 09/20/2016  Allergen Reaction Noted  . Prevacid [lansoprazole]  02/20/2007    Past Medical History:  Diagnosis Date  . Anemia   . Colon polyps   . 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 Fibroids   . Gallstones   . Irritable bowel syndrome   . Migraine   . Obesity, unspecified     Past Surgical History:  Procedure Laterality Date  . CHOLECYSTECTOMY  05/06/11  . COLONOSCOPY  11/2001   polyp; negative pathology  . COLONOSCOPY  09/2004   Negative  . COLONOSCOPY W/ POLYPECTOMY    . ESOPHAGOGASTRODUODENOSCOPY  1/10   normal  . FLEXIBLE SIGMOIDOSCOPY  03/05/2012   Procedure: FLEXIBLE SIGMOIDOSCOPY;  Surgeon: Inda Castle, MD;  Location: [facility];  Service: Endoscopy;  Laterality: N/A;  . LAPAROSCOPIC DECORTICATION / DUBULKING / ABLATION RENAL CYSTS     saline histogram  . TONSILLECTOMY  07/30/07  . Uterine US  09/2005   Large endometrial stripe; small ovarian cyst    Family History  Problem Relation Age of Onset  . Heart disease Father   . Hypertension Father   . Heart attack Father   .  Diabetes Father   . Ulcers Brother   . Clotting disorder Brother   . Colon cancer Maternal Grandmother   . Ovarian cancer Maternal Grandmother   . Colon polyps Maternal Grandmother   . Diabetes Paternal Grandmother   . Hypertension Paternal Grandmother   . Breast cancer Maternal Aunt     Age 26's  . Diabetes Paternal Aunt     x 5  . Lung cancer Maternal Grandfather     SMOKER    Social History   Social History  . Marital status: Married    Spouse name: N/A  . Number of children: 2  .  Years of education: N/A   Occupational History  . Teacher    Social History Main Topics  . Smoking status: Never Smoker  . Smokeless tobacco: Never Used  . Alcohol use No  . Drug use: No  . Sexual activity: Yes    Birth control/ protection: Condom, IUD   Other Topics Concern  . 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 Use-soda            Review of systems: Review of Systems  Constitutional: Negative for fever and chills.  HENT: Negative.   Eyes: Negative for blurred vision.  Respiratory: Negative for cough, shortness of breath and wheezing.   Cardiovascular: Negative for chest pain and palpitations.  Gastrointestinal: as per HPI Genitourinary: Negative for dysuria, urgency, frequency and hematuria.  Musculoskeletal: Negative for myalgias, back pain and joint pain.  Skin: Negative for itching and rash.  Neurological: Negative for dizziness, tremors, focal weakness, seizures and loss of consciousness.  Endo/Heme/Allergies: Positive for seasonal allergies.  Psychiatric/Behavioral: Negative for depression, suicidal ideas and hallucinations.  All other systems reviewed and are negative.   Physical Exam: Vitals:   09/20/16 1334  BP: 122/70  Pulse: 76   Body mass index is 44.41 kg/m. Gen:      No acute distress HEENT:  EOMI, sclera anicteric Neck:     No masses; no thyromegaly Lungs:    Clear to auscultation bilaterally; normal respiratory effort CV:         Regular rate and rhythm; no murmurs Abd:      + bowel sounds; soft, non-tender; no palpable masses, no distension Ext:    No edema; adequate peripheral perfusion Skin:      Warm and dry; no rash Neuro: alert and oriented x 3 Psych: normal mood and affect Rectal exam: Normal anal sphincter tone, no anal fissure or external hemorrhoids Anoscopy: Small grade II internal hemorrhoids, no active bleeding, normal dentate line, no visible nodules  Data  Reviewed:  Reviewed labs, radiology imaging, old records and pertinent past GI work up   Assessment and Plan/Recommendations: 36 year old female with history of irritable bowel syndrome predominant diarrhea here with complaints of intermittent bright red blood per rectum  Likely etiology of rectal bleeding small volume hemorrhage from internal hemorrhoids Patient is due for surveillance colonoscopy with history of tubular adenoma on last colonoscopy in February 2013 We'll schedule colonoscopy for surveillance The risks and benefits as well as alternatives of endoscopic procedure(s) have been discussed and reviewed. All questions answered. The patient agrees to proceed. Start Anusol suppository per rectum at bedtime as needed After colonoscopy, schedule for hemorrhoidal band ligation if continues to have persistent symptoms   IBS-D Likely has a dietary component Advised patient to do  a trial of lactose-free diet for 2 weeks and see if has any improvement of symptoms Start cholestyramine 4 g 3 times a day before meals   25 minutes was spent face-to-face with the patient. Greater than 50% of the time used for counseling as well as treatment plan and follow-up. She had multiple questions which were answered to her satisfaction  K. Denzil Magnuson , MD [phone] Mon-Fri 8a-5p [phone] after 5p, weekends, holidays  CC: Tower, Wynelle Fanny, MD

## 2016-09-28 ENCOUNTER — Telehealth: Payer: Self-pay

## 2016-09-28 ENCOUNTER — Ambulatory Visit (AMBULATORY_SURGERY_CENTER): Payer: Self-pay

## 2016-09-28 VITALS — Ht 66.0 in | Wt 277.2 lb

## 2016-09-28 DIAGNOSIS — Z860100 Personal history of colon polyps, unspecified: Secondary | ICD-10-CM

## 2016-09-28 MED ORDER — SUPREP BOWEL PREP KIT 17.5-3.13-1.6 GM/177ML PO SOLN: 1.0000 | Freq: Once | ORAL | 0 refills | Status: AC

## 2016-09-28 NOTE — Telephone Encounter (Signed)
Dr Silverio Decamp,      Wants Reglan for nausea please.                                            Thanks,                                             Levada Dy

## 2016-09-28 NOTE — Progress Notes (Signed)
No allergies to eggs or soy No past problems with anesthesia No home oxygen No diet meds  Declined emmi 

## 2016-09-29 ENCOUNTER — Encounter: Payer: Self-pay | Admitting: Gastroenterology

## 2016-10-05 ENCOUNTER — Encounter: Payer: Self-pay | Admitting: Gastroenterology

## 2016-10-05 ENCOUNTER — Ambulatory Visit (AMBULATORY_SURGERY_CENTER): Payer: BC Managed Care – PPO | Admitting: Gastroenterology

## 2016-10-05 VITALS — BP 145/64 | HR 55 | Temp 97.8°F | Resp 12 | Ht 66.0 in | Wt 277.0 lb

## 2016-10-05 DIAGNOSIS — Z86010 Personal history of colonic polyps: Secondary | ICD-10-CM

## 2016-10-05 MED ORDER — SODIUM CHLORIDE 0.9 % IV SOLN: 500.0000 mL | INTRAVENOUS | Status: DC

## 2016-10-05 NOTE — Op Note (Signed)
[facility] Patient Name: Miyako Barrionuevo Procedure Date: 10/05/2016 4:13 PM MRN: [id number] Endoscopist: Mauri Pole , MD Age: 36 Referring MD:  Date of Birth: 1981/05/31 Gender: Female Account #: [id number] Procedure:                Colonoscopy Indications:              Surveillance: Personal history of adenomatous                            polyps on last colonoscopy 5 years ago Medicines:                Monitored Anesthesia Care Procedure:                Pre-Anesthesia Assessment:                           - Prior to the procedure, a History and Physical                            was performed, and patient medications and                            allergies were reviewed. The patient's tolerance of                            previous anesthesia was also reviewed. The risks                            and benefits of the procedure and the sedation                            options and risks were discussed with the patient.                            All questions were answered, and informed consent                            was obtained. Prior Anticoagulants: The patient has                            taken no previous anticoagulant or antiplatelet                            agents. ASA Grade Assessment: III - A patient with                            severe systemic disease. After reviewing the risks                            and benefits, the patient was deemed in                            satisfactory condition to undergo the procedure.  After obtaining informed consent, the colonoscope                            was passed under direct vision. Throughout the                            procedure, the patient's blood pressure, pulse, and                            oxygen saturations were monitored continuously. The                            Model CF-HQ190L [id number]) scope was introduced                            through the  anus and advanced to the the terminal                            ileum, with identification of the appendiceal                            orifice and IC valve. The colonoscopy was performed                            without difficulty. The patient tolerated the                            procedure well. The quality of the bowel                            preparation was good. The terminal ileum, ileocecal                            valve, appendiceal orifice, and rectum were                            photographed. Scope In: 4:25:11 PM Scope Out: 4:37:16 PM Scope Withdrawal Time: 0 hours 8 minutes 42 seconds  Total Procedure Duration: 0 hours 12 minutes 5 seconds  Findings:                 The perianal and digital rectal examinations were                            normal.                           Non-bleeding internal hemorrhoids were found during                            retroflexion. The hemorrhoids were small.                           The exam was otherwise without abnormality. Complications:            No immediate complications. Estimated Blood  Loss:     Estimated blood loss: none. Impression:               - Non-bleeding internal hemorrhoids.                           - The examination was otherwise normal.                           - No specimens collected. Recommendation:           - Patient has a contact number available for                            emergencies. The signs and symptoms of potential                            delayed complications were discussed with the                            patient. Return to normal activities tomorrow.                            Written discharge instructions were provided to the                            patient.                           - Resume previous diet.                           - Continue present medications.                           - Repeat colonoscopy in 10 years for screening                             purposes.                           - Return to GI clinic PRN. Mauri Pole, MD 10/05/2016 4:47:17 PM This report has been signed electronically.

## 2016-10-05 NOTE — Patient Instructions (Signed)
YOU HAD AN ENDOSCOPIC PROCEDURE TODAY AT [facility]:   Refer to the procedure report that was given to you for any specific questions about what was found during the examination.  If the procedure report does not answer your questions, please call your gastroenterologist to clarify.  If you requested that your care partner not be given the details of your procedure findings, then the procedure report has been included in a sealed envelope for you to review at your convenience later.  YOU SHOULD EXPECT: Some feelings of bloating in the abdomen. Passage of more gas than usual.  Walking can help get rid of the air that was put into your GI tract during the procedure and reduce the bloating. If you had a lower endoscopy (such as a colonoscopy or flexible sigmoidoscopy) you may notice spotting of blood in your stool or on the toilet paper. If you underwent a bowel prep for your procedure, you may not have a normal bowel movement for a few days.  Please Note:  You might notice some irritation and congestion in your nose or some drainage.  This is from the oxygen used during your procedure.  There is no need for concern and it should clear up in a day or so.  SYMPTOMS TO REPORT IMMEDIATELY:   Following lower endoscopy (colonoscopy or flexible sigmoidoscopy):  Excessive amounts of blood in the stool  Significant tenderness or worsening of abdominal pains  Swelling of the abdomen that is new, acute  Fever of 100F or higher       For urgent or emergent issues, a gastroenterologist can be reached at any hour by calling [phone].   DIET:  We do recommend a small meal at first, but then you may proceed to your regular diet.  Drink plenty of fluids but you should avoid alcoholic beverages for 24 hours.  ACTIVITY:  You should plan to take it easy for the rest of today and you should NOT DRIVE or use heavy machinery until tomorrow (because of the sedation medicines used during the  test).    FOLLOW UP: Our staff will call the number listed on your records the next business day following your procedure to check on you and address any questions or concerns that you may have regarding the information given to you following your procedure. If we do not reach you, we will leave a message.  However, if you are feeling well and you are not experiencing any problems, there is no need to return our call.  We will assume that you have returned to your regular daily activities without incident.  If any biopsies were taken you will be contacted by phone or by letter within the next 1-3 weeks.  Please call us at [phone] if you have not heard about the biopsies in 3 weeks.    SIGNATURES/CONFIDENTIALITY: You and/or your care partner have signed paperwork which will be entered into your electronic medical record.  These signatures attest to the fact that that the information above on your After Visit Summary has been reviewed and is understood.  Full responsibility of the confidentiality of this discharge information lies with you and/or your care-partner.   INFORMATION ON HEMORRHOIDS GIVEN TO YOU TODAY

## 2016-10-05 NOTE — Progress Notes (Signed)
A/ox3 pleased with MAC, report to [facility]

## 2016-10-07 ENCOUNTER — Telehealth: Payer: Self-pay | Admitting: *Deleted

## 2016-10-07 NOTE — Telephone Encounter (Signed)
  Follow up Call-  Call back number 10/05/2016  Post procedure Call Back phone  # [phone]  Permission to leave phone message Yes  Some recent data might be hidden     Patient questions:  Do you have a fever, pain , or abdominal swelling? No. Pain Score  0 *  Have you tolerated food without any problems? Yes.    Have you been able to return to your normal activities? Yes.    Do you have any questions about your discharge instructions: Diet   No. Medications  No. Follow up visit  No.  Do you have questions or concerns about your Care? No.  Actions: * If pain score is 4 or above: No action needed, pain <4.

## 2016-10-31 ENCOUNTER — Encounter: Payer: BC Managed Care – PPO | Admitting: Gastroenterology

## 2016-11-03 ENCOUNTER — Encounter: Payer: Self-pay | Admitting: Internal Medicine

## 2016-11-03 ENCOUNTER — Ambulatory Visit (INDEPENDENT_AMBULATORY_CARE_PROVIDER_SITE_OTHER): Payer: BC Managed Care – PPO | Admitting: Internal Medicine

## 2016-11-03 VITALS — BP 118/70 | HR 62 | Temp 98.2°F | Wt 281.5 lb

## 2016-11-03 DIAGNOSIS — R05 Cough: Secondary | ICD-10-CM

## 2016-11-03 DIAGNOSIS — R0982 Postnasal drip: Secondary | ICD-10-CM

## 2016-11-03 DIAGNOSIS — R059 Cough, unspecified: Secondary | ICD-10-CM

## 2016-11-03 MED ORDER — HYDROCODONE-HOMATROPINE 5-1.5 MG/5ML PO SYRP: 5.0000 mL | ORAL_SOLUTION | Freq: Four times a day (QID) | ORAL | 0 refills | Status: DC | PRN

## 2016-11-03 MED ORDER — CETIRIZINE HCL 10 MG PO TABS: 10.0000 mg | ORAL_TABLET | Freq: Every day | ORAL | 0 refills | Status: DC

## 2016-11-03 NOTE — Patient Instructions (Signed)
Cough, Adult  A cough helps to clear your throat and lungs. A cough may last only 2?3 weeks (acute), or it may last longer than 8 weeks (chronic). Many different things can cause a cough. A cough may be a sign of an illness or another medical condition.  Follow these instructions at home:  ? Pay attention to any changes in your cough.  ? Take medicines only as told by your doctor.  ? If you were prescribed an antibiotic medicine, take it as told by your doctor. Do not stop taking it even if you start to feel better.  ? Talk with your doctor before you try using a cough medicine.  ? Drink enough fluid to keep your pee (urine) clear or pale yellow.  ? If the air is dry, use a cold steam vaporizer or humidifier in your home.  ? Stay away from things that make you cough at work or at home.  ? If your cough is worse at night, try using extra pillows to raise your head up higher while you sleep.  ? Do not smoke, and try not to be around smoke. If you need help quitting, ask your doctor.  ? Do not have caffeine.  ? Do not drink alcohol.  ? Rest as needed.  Contact a doctor if:  ? You have new problems (symptoms).  ? You cough up yellow fluid (pus).  ? Your cough does not get better after 2?3 weeks, or your cough gets worse.  ? Medicine does not help your cough and you are not sleeping well.  ? You have pain that gets worse or pain that is not helped with medicine.  ? You have a fever.  ? You are losing weight and you do not know why.  ? You have night sweats.  Get help right away if:  ? You cough up blood.  ? You have trouble breathing.  ? Your heartbeat is very fast.  This information is not intended to replace advice given to you by your health care provider. Make sure you discuss any questions you have with your health care provider.  Document Released: 04/14/2011 Document Revised: 01/07/2016 Document Reviewed: 10/08/2014  Elsevier Interactive Patient Education ? 2017 [employer]vier Inc[employer]

## 2016-11-03 NOTE — Progress Notes (Signed)
HPI  Pt presents to the clinic today with c/o cough and chest congestion. This started today. The cough is non productive. She has noticed some chest tightness but denies shortness of breath or chest pain. She denies runny nose, ear pain or sore throat. She denies fever, chills or body aches. She has not tried anything OTC. She has no history of allergies. 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 Her flu shot is UTD.  Review of Systems        Past Medical History:  Diagnosis Date  . Anemia   . Colon polyps   . Coronary artery abnormality    spasms  . 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 Fibroids   . Gallstones   . Irritable bowel syndrome   . Migraine   . Obesity, unspecified     Family History  Problem Relation Age of Onset  . Heart disease Father   . Hypertension Father   . Heart attack Father   . Diabetes Father   . Ulcers Brother   . Clotting disorder Brother   . Ovarian cancer Maternal Grandmother   . Colon polyps Maternal Grandmother   . Colon cancer Maternal Grandmother     40's  . Diabetes Paternal Grandmother   . Hypertension Paternal Grandmother   . Breast cancer Maternal Aunt     Age 64's  . Diabetes Paternal Aunt     x 5  . Lung cancer Maternal Grandfather     SMOKER    Social History   Social History  . Marital status: Married    Spouse name: N/A  . Number of children: 2  . Years of education: N/A   Occupational History  . Teacher    Social History Main Topics  . Smoking status: Never Smoker  . Smokeless tobacco: Never Used  . Alcohol use No  . Drug use: No  . Sexual activity: Yes    Birth control/ protection: Condom, IUD   Other Topics Concern  . 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 Use-soda          Allergies  Allergen Reactions  . Prevacid [Lansoprazole]     REACTION: rash     Constitutional: Denies headache, fatigue, fever or abrupt weight  changes.  HEENT:  Denies eye redness, eye pain, pressure behind the eyes, facial pain, nasal congestion, ear pain, ringing in the ears, wax buildup, runny nose or sore throat. Respiratory: Positive cough. Denies difficulty breathing or shortness of breath.  Cardiovascular: Denies chest pain, chest tightness, palpitations or swelling in the hands or feet.   No other specific complaints in a complete review of systems (except as listed in HPI above).  Objective:   BP 118/70   Pulse 62   Temp 98.2 F (36.8 C) (Oral)   Wt 281 lb 8 oz (127.7 kg)   SpO2 98%   BMI 45.44 kg/m  Wt Readings from Last 3 Encounters:  11/03/16 281 lb 8 oz (127.7 kg)  10/05/16 277 lb (125.6 kg)  09/28/16 277 lb 3.2 oz (125.7 kg)     General: Appears her stated age, in NAD. HEENT: Head: normal shape and size, no sinus tenderness noted; Ears: Tm's gray and intact, normal light reflex; Nose: mucosa pink and moist, septum midline; Throat/Mouth: + PND. Teeth present, mucosa pink and moist, no  exudate noted, no lesions or ulcerations noted.  Neck: No cervical lymphadenopathy.  Cardiovascular: Normal rate and rhythm. S1,S2 noted.  No murmur, rubs or gallops noted.  Pulmonary/Chest: Normal effort and positive vesicular breath sounds. No respiratory distress. No wheezes, rales or ronchi noted.       Assessment & Plan:   Cough secondary to PND:  Likely allergies Get some rest and drink plenty of water eRx for Zyrtec daily x 2 weeks Rx for Hycodan cough syrup  RTC as needed or if symptoms persist.   Webb Silversmith, NP

## 2016-12-19 ENCOUNTER — Ambulatory Visit (INDEPENDENT_AMBULATORY_CARE_PROVIDER_SITE_OTHER): Payer: BC Managed Care – PPO | Admitting: Primary Care

## 2016-12-19 ENCOUNTER — Encounter: Payer: Self-pay | Admitting: Primary Care

## 2016-12-19 VITALS — BP 122/70 | HR 70 | Temp 98.5°F | Ht 66.25 in | Wt 286.1 lb

## 2016-12-19 DIAGNOSIS — N898 Other specified noninflammatory disorders of vagina: Secondary | ICD-10-CM | POA: Diagnosis not present

## 2016-12-19 DIAGNOSIS — R300 Dysuria: Secondary | ICD-10-CM | POA: Diagnosis not present

## 2016-12-19 LAB — POC URINALSYSI DIPSTICK (AUTOMATED)
BILIRUBIN UA: NEGATIVE
Blood, UA: NEGATIVE
Glucose, UA: NEGATIVE
KETONES UA: NEGATIVE
Nitrite, UA: NEGATIVE
Urobilinogen, UA: NEGATIVE E.U./dL — AB
pH, UA: 6 (ref 5.0–8.0)

## 2016-12-19 NOTE — Patient Instructions (Signed)
We will notify you of your vaginal swab results once received.  It was a pleasure meeting you!

## 2016-12-19 NOTE — Progress Notes (Signed)
Subjective:    Patient ID: Brittney [name], female    DOB: 1981/03/30, 36 y.o.   MRN: [id number]  HPI  Brittney [name] is a 36 year old female who presents today with a chief complaint of dysuria. She also reports difficulty urinating with urgency, vaginal itching, vaginal discharge. She denies hematuria, flank pain, urinary frequency, fevers. She first noticed her symptoms yesterday. She's not taken anything OTC. She denies recent antibiotic use.   Review of Systems  Constitutional: Negative for fever.  Gastrointestinal: Negative for abdominal pain.  Genitourinary: Positive for difficulty urinating, dysuria, urgency and vaginal discharge. Negative for flank pain and hematuria.       Past Medical History:  Diagnosis Date  . Anemia   . Colon polyps   . Coronary artery abnormality    spasms  . 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 Fibroids   . Gallstones   . Irritable bowel syndrome   . Migraine   . Obesity, unspecified      Social History   Social History  . Marital status: Married    Spouse name: N/A  . Number of children: 2  . Years of education: N/A   Occupational History  . Teacher    Social History Main Topics  . Smoking status: Never Smoker  . Smokeless tobacco: Never Used  . Alcohol use No  . Drug use: No  . Sexual activity: Yes    Birth control/ protection: Condom, IUD   Other Topics Concern  . 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 Clarita Crane          Past Surgical History:  Procedure Laterality Date  . CHOLECYSTECTOMY  05/06/11  . COLONOSCOPY  11/2001   polyp; negative pathology  . COLONOSCOPY  09/2004   Negative  . COLONOSCOPY W/ POLYPECTOMY    . ESOPHAGOGASTRODUODENOSCOPY  1/10   normal  . FLEXIBLE SIGMOIDOSCOPY  03/05/2012   Procedure: FLEXIBLE SIGMOIDOSCOPY;  Surgeon: Inda Castle, MD;  Location: [facility];  Service: Endoscopy;  Laterality: N/A;  . LAPAROSCOPIC  DECORTICATION / DUBULKING / ABLATION RENAL CYSTS     saline histogram  . TONSILLECTOMY  07/30/07  . Uterine US  09/2005   Large endometrial stripe; small ovarian cyst    Family History  Problem Relation Age of Onset  . Heart disease Father   . Hypertension Father   . Heart attack Father   . Diabetes Father   . Ulcers Brother   . Clotting disorder Brother   . Ovarian cancer Maternal Grandmother   . Colon polyps Maternal Grandmother   . Colon cancer Maternal Grandmother     40's  . Diabetes Paternal Grandmother   . Hypertension Paternal Grandmother   . Breast cancer Maternal Aunt     Age 48's  . Diabetes Paternal Aunt     x 5  . Lung cancer Maternal Grandfather     SMOKER    Allergies  Allergen Reactions  . Prevacid [Lansoprazole]     REACTION: rash    Current Outpatient Prescriptions on File Prior to Visit  Medication Sig Dispense Refill  . levonorgestrel (MIRENA) 20 MCG/24HR IUD 1 each by Intrauterine route once.    . cetirizine (ZYRTEC) 10 MG tablet Take 1 tablet (10 mg total) by mouth daily. 30 tablet 0  . cholestyramine (QUESTRAN) 4 g packet Take 1 packet (4  g total) by mouth 3 (three) times daily with meals. (Patient not taking: Reported on 10/05/2016) 90 each 12  . hydrocortisone (ANUSOL-HC) 25 MG suppository Place 1 suppository (25 mg total) rectally at bedtime. (Patient not taking: Reported on 10/05/2016) 14 suppository 1  . Iron-FA-B Cmp-C-Biot-Probiotic (FUSION PLUS) CAPS Take 1 capsule by mouth daily. (Patient not taking: Reported on 12/19/2016) 30 capsule 3  . nitroGLYCERIN (NITROSTAT) 0.4 MG SL tablet Place 1 tablet (0.4 mg total) under the tongue every 5 (five) minutes as needed for chest pain. (Patient not taking: Reported on 12/19/2016) 25 tablet 6   Current Facility-Administered Medications on File Prior to Visit  Medication Dose Route Frequency Provider Last Rate Last Dose  . 0.9 %  sodium chloride infusion  500 mL Intravenous Continuous Nandigam, Kavitha V, MD         BP 122/70   Pulse 70   Temp 98.5 F (36.9 C) (Oral)   Ht 5' 6.25" (1.683 m)   Wt 286 lb 1.9 oz (129.8 kg)   SpO2 99%   BMI 45.83 kg/m    Objective:   Physical Exam  Constitutional: She appears well-nourished.  Neck: Neck supple.  Cardiovascular: Normal rate and regular rhythm.   Pulmonary/Chest: Effort normal and breath sounds normal.  Abdominal: There is no CVA tenderness.  Genitourinary: There is no lesion on the right labia. There is no lesion on the left labia. Cervix exhibits discharge. Cervix exhibits no motion tenderness. No erythema or tenderness in the vagina. Vaginal discharge found.  Genitourinary Comments: Trace amount of whitish discharge  Skin: Skin is warm and dry.          Assessment & Plan:  Dysuria:  Also with vaginal itching, vaginal discharge, urgency. UA today: Trace leuks, negative nitrites, negative blood. Culture sent. Wet Prep sent off and pending. Suspect vaginal involvement, will await results.  Sheral Flow, NP

## 2016-12-19 NOTE — Addendum Note (Signed)
Addended by: Jacqualin Combes on: 12/19/2016 04:36 PM   Modules accepted: Orders

## 2016-12-19 NOTE — Progress Notes (Signed)
Pre visit review using our clinic review tool, if applicable. No additional management support is needed unless otherwise documented below in the visit note. 

## 2016-12-20 ENCOUNTER — Other Ambulatory Visit: Payer: Self-pay | Admitting: Primary Care

## 2016-12-20 DIAGNOSIS — B3731 Acute candidiasis of vulva and vagina: Secondary | ICD-10-CM

## 2016-12-20 DIAGNOSIS — B373 Candidiasis of vulva and vagina: Secondary | ICD-10-CM

## 2016-12-20 LAB — WET PREP BY MOLECULAR PROBE
CANDIDA SPECIES: DETECTED — AB
Gardnerella vaginalis: NOT DETECTED
Trichomonas vaginosis: NOT DETECTED

## 2016-12-20 MED ORDER — FLUCONAZOLE 150 MG PO TABS: ORAL_TABLET | ORAL | 0 refills | Status: DC

## 2016-12-21 LAB — URINE CULTURE: Organism ID, Bacteria: NO GROWTH

## 2017-03-03 ENCOUNTER — Encounter: Payer: Self-pay | Admitting: Family Medicine

## 2017-03-03 ENCOUNTER — Telehealth: Payer: Self-pay

## 2017-03-03 ENCOUNTER — Ambulatory Visit (INDEPENDENT_AMBULATORY_CARE_PROVIDER_SITE_OTHER): Payer: BC Managed Care – PPO | Admitting: Family Medicine

## 2017-03-03 DIAGNOSIS — K529 Noninfective gastroenteritis and colitis, unspecified: Secondary | ICD-10-CM | POA: Diagnosis not present

## 2017-03-03 MED ORDER — CHOLESTYRAMINE 4 G PO PACK: 4.0000 g | PACK | Freq: Three times a day (TID) | ORAL | 11 refills | Status: DC

## 2017-03-03 MED ORDER — NALTREXONE-BUPROPION HCL ER 8-90 MG PO TB12: 2.0000 | ORAL_TABLET | Freq: Two times a day (BID) | ORAL | 3 refills | Status: DC

## 2017-03-03 NOTE — Patient Instructions (Signed)
Look into coverage of Belviq and Contrave  Let me know if you want to try either of them  Try the questran for your GI issues   Keep exercising  Eat smart - avoid junk and processed foods  Let's check your thyroid today as well

## 2017-03-03 NOTE — Progress Notes (Signed)
Subjective:    Patient ID: Brittney [name], female    DOB: 01-May-1981, 36 y.o.   MRN: [id number]  HPI Here for a visit concerning weight/obesity  Wt Readings from Last 3 Encounters:  03/03/17 291 lb 12 oz (132.3 kg)  12/19/16 286 lb 1.9 oz (129.8 kg)  11/03/16 281 lb 8 oz (127.7 kg)   46.73 kg/m   She continues to gain weight  Has always struggles  Keeps gaining   Exercise: she does group classes 3 times per wk with a group (until 2 wk ago) Or works out for 1 hour with cardio and weights (likes that)  Circuit classes -- HIIT  She does exercise videos and equpt at home  Exercises at least 5 d per week   Does not have a big appetite Has tried eating low or no carb - does not loose with that  Avoids fried foods and fat   She has tried myfitness pal - 1700 calories  Weight watchers  Did not loose with either of them   Does not eat breakfast  Lunch at [employer] - nuggets and fries (small amt) , or sandwich, or pc of grilled chicken  Dinner - example baked pork chop and collard greens   Eats fruits and veg as tolerated  Does not tolerate fruits well- diarrhea  A lot of stomach problems  Has not been taking Lucrezia Starch -has not tried it (wants a new px)    She went to a weight loss center years ago  Had HCG and B12 shots  She lost some wt  She stopped to have a baby - and then gained it all back plus more     Lab Results  Component Value Date   TSH 1.31 08/29/2014    Family history of obesity  Mother is obese  Brother has gained wt   Lab Results  Component Value Date   CHOL 150 08/29/2014   HDL 39.20 08/29/2014   [place] 103 (H) 08/29/2014   TRIG 38.0 08/29/2014   CHOLHDL 4 08/29/2014    She thought about weight loss surgery  Went through the process to get ready  She changed her mind regarding surgery- afraid of surgery and afraid it will make her unhealthy and she also has friends who gained the weight back   Patient Active Problem List   Diagnosis  Date Noted  . Rectal irritation 05/18/2016  . Gastritis 03/02/2016  . Contusion 02/19/2016  . Fibrocystic breast changes 01/08/2016  . Breast lump on right side at 5 o'clock position 01/08/2016  . Hemorrhoids, external 09/30/2014  . Chronic diarrhea 08/29/2014  . 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 Hemorrhoids, internal, with bleeding 04/22/2014  . Family history of pulmonary embolism 09/02/2013  . Family history of diabetes mellitus 09/02/2013  . Cutaneous skin tags 05/15/2013  . Sebaceous cyst of left axilla 05/15/2013  . DUB (dysfunctional uterine bleeding) 09/24/2012  . Endometritis 09/24/2012  . 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 EDEMA 11/04/2009  . Morbid obesity ([place]) 03/31/2008  . HEARING LOSS, MILD 03/31/2008  . MIGRAINE HEADACHE 02/20/2007  . IBS 02/20/2007  . OSTEOARTHRITIS 02/20/2007   Past Medical History:  Diagnosis Date  . Anemia   . Colon polyps   . Coronary artery abnormality    spasms  . 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 Fibroids   . Gallstones   . Irritable bowel syndrome   . Migraine   . Obesity, unspecified    Past Surgical History:  Procedure Laterality Date  . CHOLECYSTECTOMY  05/06/11  . COLONOSCOPY  11/2001   polyp; negative pathology  . COLONOSCOPY  09/2004   Negative  . COLONOSCOPY W/ POLYPECTOMY    . ESOPHAGOGASTRODUODENOSCOPY  1/10   normal  . FLEXIBLE SIGMOIDOSCOPY  03/05/2012   Procedure: FLEXIBLE SIGMOIDOSCOPY;  Surgeon: Inda Castle, MD;  Location: [facility];  Service: Endoscopy;  Laterality: N/A;  . LAPAROSCOPIC DECORTICATION / DUBULKING / ABLATION RENAL CYSTS     saline histogram  . TONSILLECTOMY  07/30/07  . Uterine US  09/2005   Large endometrial stripe; small ovarian cyst   Social History  Substance Use Topics  . Smoking status: Never Smoker  . Smokeless tobacco: Never Used  . Alcohol use No   Family History  Problem Relation Age of Onset  . Heart disease Father   . Hypertension Father   . Heart attack  Father   . Diabetes Father   . Ulcers Brother   . Clotting disorder Brother   . Ovarian cancer Maternal Grandmother   . Colon polyps Maternal Grandmother   . Colon cancer Maternal Grandmother        40's  . Diabetes Paternal Grandmother   . Hypertension Paternal Grandmother   . Breast cancer Maternal Aunt        Age 38's  . Diabetes Paternal Aunt        x 5  . Lung cancer Maternal Grandfather        SMOKER   Allergies  Allergen Reactions  . Prevacid [Lansoprazole]     REACTION: rash   Current Outpatient Prescriptions on File Prior to Visit  Medication Sig Dispense Refill  . cetirizine (ZYRTEC) 10 MG tablet Take 1 tablet (10 mg total) by mouth daily. (Patient taking differently: Take 10 mg by mouth daily as needed. ) 30 tablet 0  . Iron-FA-B Cmp-C-Biot-Probiotic (FUSION PLUS) CAPS Take 1 capsule by mouth daily. (Patient not taking: Reported on 12/19/2016) 30 capsule 3  . nitroGLYCERIN (NITROSTAT) 0.4 MG SL tablet Place 1 tablet (0.4 mg total) under the tongue every 5 (five) minutes as needed for chest pain. (Patient not taking: Reported on 03/03/2017) 25 tablet 6   Current Facility-Administered Medications on File Prior to Visit  Medication Dose Route Frequency Provider Last Rate Last Dose  . 0.9 %  sodium chloride infusion  500 mL Intravenous Continuous Nandigam, Kavitha V, MD         Review of Systems Review of Systems  Constitutional: Negative for fever, appetite change, fatigue and pos for continued wt gain  Eyes: Negative for pain and visual disturbance.  Respiratory: Negative for cough and shortness of breath.   Cardiovascular: Negative for cp or palpitations    Gastrointestinal: Negative for nausea, diarrhea and constipation.  Genitourinary: Negative for urgency and frequency.  Skin: Negative for pallor or rash   Neurological: Negative for weakness, light-headedness, numbness and headaches.  Hematological: Negative for adenopathy. Does not bruise/bleed easily.    Psychiatric/Behavioral: Negative for dysphoric mood. The patient is not nervous/anxious.         Objective:   Physical Exam  Constitutional: She appears well-developed and well-nourished. No distress.  Morbidly obese and well app  HENT:  Head: Normocephalic and atraumatic.  Mouth/Throat: Oropharynx is clear and moist.  Eyes: Pupils are equal, round, and reactive to light. Conjunctivae and EOM are normal.  Neck: Normal range of motion. Neck supple. No JVD present. Carotid bruit is not present. No thyromegaly present.  Cardiovascular: Normal rate,  regular rhythm, normal heart sounds and intact distal pulses.  Exam reveals no gallop.   Pulmonary/Chest: Effort normal and breath sounds normal. No respiratory distress. She has no wheezes. She has no rales.  No crackles  Abdominal: Soft. Bowel sounds are normal. She exhibits no distension, no abdominal bruit and no mass. There is no tenderness.  Musculoskeletal: She exhibits no edema.  Lymphadenopathy:    She has no cervical adenopathy.  Neurological: She is alert. She has normal reflexes.  Skin: Skin is warm and dry. No rash noted. No pallor.  Psychiatric: She has a normal mood and affect.          Assessment & Plan:   Problem List Items Addressed This Visit      Digestive   Chronic diarrhea - Primary    Enc her to try the cholestyramine  Px it since hers was expired Check TSH today      Relevant Orders   TSH (Completed)     Other   Morbid obesity ([place])    Discussed how this problem influences overall health and the risks it imposes  Reviewed plan for weight loss with lower calorie diet (via better food choices and also portion control or program like weight watchers) and exercise building up to or more than 30 minutes 5 days per week including some aerobic activity   Rev some of her wt loss efforts in the past and she does not seem to loose with reduced calories and exercise  Cannot always eat healthy due to chronic  diarrhea -also a problem She has looked into wt loss surgery and unsure about it  Disc poss med opt like Belviq and contrave Info given and she will check insurance re: coverage No signs of emotional eating or mood disorder currently Check TSH today      Relevant Orders   TSH (Completed)

## 2017-03-03 NOTE — Telephone Encounter (Signed)
Pt said that her ins will be the same for Belviq or Contrave; and either one will require prior auth. For prior auth call [phone].

## 2017-03-03 NOTE — Telephone Encounter (Signed)
I printed the px for contrave   (in IN box)  The px says 2 tabs bid -but she needs to titrate up   Week one 1 pill in am Week two  1 pill bid Week three 2 pills am and 1 pm  Week four and maintenance 2 pills bid   Will need to call for prior auth  Thanks

## 2017-03-04 ENCOUNTER — Encounter: Payer: Self-pay | Admitting: Family Medicine

## 2017-03-04 LAB — TSH: TSH: 1.25 mIU/L

## 2017-03-05 NOTE — Assessment & Plan Note (Addendum)
Enc her to try the cholestyramine  Px it since hers was expired Check TSH today

## 2017-03-05 NOTE — Assessment & Plan Note (Addendum)
Discussed how this problem influences overall health and the risks it imposes  Reviewed plan for weight loss with lower calorie diet (via better food choices and also portion control or program like weight watchers) and exercise building up to or more than 30 minutes 5 days per week including some aerobic activity   Rev some of her wt loss efforts in the past and she does not seem to loose with reduced calories and exercise  Cannot always eat healthy due to chronic diarrhea -also a problem She has looked into wt loss surgery and unsure about it  Disc poss med opt like Belviq and contrave Info given and she will check insurance re: coverage No signs of emotional eating or mood disorder currently Check TSH today

## 2017-03-06 NOTE — Telephone Encounter (Signed)
Pt advise Rx ready for pick up, I advise once pharmacy tries to process Rx they will send [place] the fax saying she needs a PA, and I also advise pt on Dr. Marliss Coots instructions on how to titrate the Rx

## 2017-03-08 ENCOUNTER — Telehealth: Payer: Self-pay | Admitting: *Deleted

## 2017-03-08 NOTE — Telephone Encounter (Signed)
PA for Contrave has been done at [email or web address], I will await a response

## 2017-03-08 NOTE — Telephone Encounter (Signed)
PA was approved, pharmacy and pt notified, letter placed in Dr. Marliss Coots inbox to sign and send for scanning

## 2017-03-27 ENCOUNTER — Telehealth: Payer: Self-pay

## 2017-03-27 MED ORDER — HYDROCORTISONE ACETATE 25 MG RE SUPP: 25.0000 mg | Freq: Every day | RECTAL | 0 refills | Status: DC

## 2017-03-27 NOTE — Telephone Encounter (Signed)
Px written for call in   

## 2017-03-27 NOTE — Telephone Encounter (Signed)
Pt is in [place] and is having issues with her hemorrhoids. She is asking for an rx for suppositories like she had in the past. Please send to [facility] [phone]. Call pt with any questions.

## 2017-03-27 NOTE — Telephone Encounter (Signed)
Rx sent to requested pharmacy and left voicemail letting pt know Rx sent

## 2017-04-19 ENCOUNTER — Ambulatory Visit (INDEPENDENT_AMBULATORY_CARE_PROVIDER_SITE_OTHER): Payer: BC Managed Care – PPO | Admitting: Family Medicine

## 2017-04-19 ENCOUNTER — Encounter: Payer: Self-pay | Admitting: Family Medicine

## 2017-04-19 VITALS — BP 122/78 | HR 71 | Temp 98.1°F | Wt 292.1 lb

## 2017-04-19 DIAGNOSIS — L089 Local infection of the skin and subcutaneous tissue, unspecified: Secondary | ICD-10-CM | POA: Diagnosis not present

## 2017-04-19 MED ORDER — SULFAMETHOXAZOLE-TRIMETHOPRIM 800-160 MG PO TABS: 1.0000 | ORAL_TABLET | Freq: Two times a day (BID) | ORAL | 0 refills | Status: DC

## 2017-04-19 MED ORDER — FLUCONAZOLE 150 MG PO TABS: 150.0000 mg | ORAL_TABLET | Freq: Once | ORAL | 0 refills | Status: AC

## 2017-04-19 NOTE — Progress Notes (Signed)
BP 122/78   Pulse 71   Temp 98.1 F (36.7 C) (Oral)   Wt 292 lb 1.9 oz (132.5 kg)   LMP 04/11/2017   SpO2 98%   BMI 46.79 kg/m    CC: check lumps on thigh Subjective:    Patient ID: [name], female    DOB: 1981-08-14, 36 y.o.   MRN: [id number]  HPI: NYASHA RAHILLY is a 36 y.o. female presenting on 04/19/2017 for Mass (two small bumps--inner left thigh--painful)   Yesterday noticed 2 lumps on left inner thigh, treating with bandaid and neosporin without improvement. No bug bites that she knows of   Denies fevers/chills, nausea, streaking redness.  Denies prior h/o boils or hydradenitis.   Relevant past medical, surgical, family and social history reviewed and updated as indicated. Interim medical history since our last visit reviewed. Allergies and medications reviewed and updated. Outpatient Medications Prior to Visit  Medication Sig Dispense Refill  . cetirizine (ZYRTEC) 10 MG tablet Take 1 tablet (10 mg total) by mouth daily. (Patient not taking: Reported on 04/19/2017) 30 tablet 0  . cholestyramine (QUESTRAN) 4 g packet Take 1 packet (4 g total) by mouth 3 (three) times daily with meals. (Patient not taking: Reported on 04/19/2017) 90 each 11  . hydrocortisone (ANUSOL-HC) 25 MG suppository Place 1 suppository (25 mg total) rectally at bedtime. (Patient not taking: Reported on 04/19/2017) 14 suppository 0  . Iron-FA-B Cmp-C-Biot-Probiotic (FUSION PLUS) CAPS Take 1 capsule by mouth daily. (Patient not taking: Reported on 04/19/2017) 30 capsule 3  . Naltrexone-Bupropion HCl ER 8-90 MG TB12 Take 2 tablets by mouth 2 (two) times daily. (Patient not taking: Reported on 04/19/2017) 120 tablet 3  . nitroGLYCERIN (NITROSTAT) 0.4 MG SL tablet Place 1 tablet (0.4 mg total) under the tongue every 5 (five) minutes as needed for chest pain. (Patient not taking: Reported on 04/19/2017) 25 tablet 6   Facility-Administered Medications Prior to Visit  Medication Dose Route Frequency  Provider Last Rate Last Dose  . 0.9 %  sodium chloride infusion  500 mL Intravenous Continuous Nandigam, Kavitha V, MD         Per HPI unless specifically indicated in ROS section below Review of Systems     Objective:    BP 122/78   Pulse 71   Temp 98.1 F (36.7 C) (Oral)   Wt 292 lb 1.9 oz (132.5 kg)   LMP 04/11/2017   SpO2 98%   BMI 46.79 kg/m   Wt Readings from Last 3 Encounters:  04/19/17 292 lb 1.9 oz (132.5 kg)  03/03/17 291 lb 12 oz (132.3 kg)  12/19/16 286 lb 1.9 oz (129.8 kg)    Physical Exam  Constitutional: She appears well-developed and well-nourished. No distress.  Skin: Skin is warm and dry. No rash noted. No erythema.  Slight skin thickening L inner thigh with 2 developing pustules, proximal one with whitehead  Nursing note and vitals reviewed.  Simple I&D L thigh cleaned with alcohol pad, numbing spray with ethyl chloride, then using 22g needle, small incision made into proximal pustule and small amt pus expressed from lesion. Area dressed with triple abx ointment and bandage. Pt tolerated well. Aftercare reviewed.     Assessment & Plan:   Problem List Items Addressed This Visit    Skin pustule - Primary    Pustule vs cyst vs developing hydradenitis - proximal one drained with simple I&D using 22g needle. Will also Rx 5d bactrim DS course. Recommended warm compresses and  warm water soaks. Update if not improving with treatment. Red flags to seek further care reviewed.           Follow up plan: No Follow-up on file.  Ria Bush, MD

## 2017-04-19 NOTE — Assessment & Plan Note (Addendum)
Pustule vs cyst vs developing hydradenitis - proximal one drained with simple I&D using 22g needle. Will also Rx 5d bactrim DS course. Recommended warm compresses and warm water soaks. Update if not improving with treatment. Red flags to seek further care reviewed.

## 2017-07-11 ENCOUNTER — Emergency Department: Payer: BC Managed Care – PPO

## 2017-07-11 ENCOUNTER — Ambulatory Visit: Payer: Self-pay

## 2017-07-11 ENCOUNTER — Other Ambulatory Visit: Payer: Self-pay

## 2017-07-11 ENCOUNTER — Emergency Department (HOSPITAL_COMMUNITY)
Admission: EM | Admit: 2017-07-11 | Discharge: 2017-07-11 | Discharge status: Against Medical Advice | Payer: BC Managed Care – PPO | Attending: Emergency Medicine | Admitting: Emergency Medicine

## 2017-07-11 ENCOUNTER — Emergency Department
Admission: EM | Admit: 2017-07-11 | Discharge: 2017-07-11 | Disposition: A | Discharge status: Home / Self Care | Payer: BC Managed Care – PPO | Attending: Emergency Medicine | Admitting: Emergency Medicine

## 2017-07-11 ENCOUNTER — Encounter (HOSPITAL_COMMUNITY): Payer: Self-pay | Admitting: Emergency Medicine

## 2017-07-11 DIAGNOSIS — R110 Nausea: Secondary | ICD-10-CM | POA: Diagnosis not present

## 2017-07-11 DIAGNOSIS — M542 Cervicalgia: Secondary | ICD-10-CM | POA: Diagnosis not present

## 2017-07-11 DIAGNOSIS — R51 Headache: Secondary | ICD-10-CM | POA: Diagnosis present

## 2017-07-11 DIAGNOSIS — Z5321 Procedure and treatment not carried out due to patient leaving prior to being seen by health care provider: Secondary | ICD-10-CM | POA: Diagnosis not present

## 2017-07-11 DIAGNOSIS — G43109 Migraine with aura, not intractable, without status migrainosus: Secondary | ICD-10-CM | POA: Diagnosis not present

## 2017-07-11 DIAGNOSIS — H53149 Visual discomfort, unspecified: Secondary | ICD-10-CM | POA: Insufficient documentation

## 2017-07-11 DIAGNOSIS — Z79899 Other long term (current) drug therapy: Secondary | ICD-10-CM | POA: Insufficient documentation

## 2017-07-11 MED ORDER — NAPROXEN 500 MG PO TABS: 500.0000 mg | ORAL_TABLET | Freq: Two times a day (BID) | ORAL | 0 refills | Status: DC

## 2017-07-11 MED ORDER — DIPHENHYDRAMINE HCL 50 MG/ML IJ SOLN
50.0000 mg | Freq: Once | INTRAMUSCULAR | Status: AC
  Administered 2017-07-11: 50 mg via INTRAMUSCULAR
  Filled 2017-07-11: qty 1

## 2017-07-11 MED ORDER — DIPHENHYDRAMINE HCL 25 MG PO CAPS: 50.0000 mg | ORAL_CAPSULE | Freq: Four times a day (QID) | ORAL | 0 refills | Status: DC | PRN

## 2017-07-11 MED ORDER — METOCLOPRAMIDE HCL 5 MG/5ML PO SOLN
10.0000 mg | ORAL | Status: AC
  Administered 2017-07-11: 10 mg via ORAL
  Filled 2017-07-11: qty 10

## 2017-07-11 MED ORDER — METOCLOPRAMIDE HCL 10 MG PO TABS: 10.0000 mg | ORAL_TABLET | Freq: Four times a day (QID) | ORAL | 0 refills | Status: DC | PRN

## 2017-07-11 MED ORDER — KETOROLAC TROMETHAMINE 60 MG/2ML IM SOLN
15.0000 mg | Freq: Once | INTRAMUSCULAR | Status: AC
  Administered 2017-07-11: 15 mg via INTRAMUSCULAR
  Filled 2017-07-11: qty 2

## 2017-07-11 NOTE — Telephone Encounter (Signed)
Pt. Called and reported a migraine headache that started about 9:30 AM today.  Reported the headache started after "seeing lines" in right eye, and then experienced "only seeing the lines and blurred vision through right eye x 5-10 min." Reported she has headache pain left forehead.  Denied any further visual disturbance.  Denied any other complaints.  Stated she put sunglasses on and closed her window blinds, and this temporarily helped.  Denied any dizziness, numbness, or any other symptoms.  Reported she hasn't taken anything for the migraine, as she is out of Imitrex.  Reported hasn't had a Migraine in approx. one year.  Per protocol, advised to go to ER, but pt. declined, stating she was there today, and left, due to the long wait.  Care advice per protocol.  Pt. Verb. Understanding.  Will have her mother take her to the UC now.      Reason for Disposition . Loss of vision or double vision (Exception: same as prior migraines)  Answer Assessment - Initial Assessment Questions 1. LOCATION: "Where does it hurt?"      Left side of Forehead  2. ONSET: "When did the headache start?" (Minutes, hours or days)      Approx. 9:30 AM  3. PATTERN: "Does the pain come and go, or has it been constant since it started?"     Constant 4. SEVERITY: "How bad is the pain?" and "What does it keep you from doing?"  (e.g., Scale 1-10; mild, moderate, or severe)   - MILD (1-3): doesn't interfere with normal activities    - MODERATE (4-7): interferes with normal activities or awakens from sleep    - SEVERE (8-10): excruciating pain, unable to do any normal activities        7-8/10 5. RECURRENT SYMPTOM: "Have you ever had headaches before?" If so, ask: "When was the last time?" and "What happened that time?"      Similar type of migraine; with exception of loss of right eye vision for 5-10 min. ; last migraine was over one year ago. 6. CAUSE: "What do you think is causing the headache?"     Hx of migraine  headaches 7. MIGRAINE: "Have you been diagnosed with migraine headaches?" If so, ask: "Is this headache similar?"      Yes; Dr. Glori Bickers 8. HEAD INJURY: "Has there been any recent injury to the head?"      no 9. OTHER SYMPTOMS: "Do you have any other symptoms?" (fever, stiff neck, eye pain, sore throat, cold symptoms)     No; except loss of vision in right eye; saw lines and vision was blurred for about 5-10 min.  10. PREGNANCY: "Is there any chance you are pregnant?" "When was your last menstrual period?"       No; LMP 11/22  Protocols used: HEADACHE-A-AH

## 2017-07-11 NOTE — ED Notes (Signed)
Patient transported to CT scan at this time.  Will continue to monitor.

## 2017-07-11 NOTE — ED Provider Notes (Signed)
[facility] Emergency Department Provider Note  ____________________________________________  Time seen: Approximately 7:09 PM  I have reviewed the triage vital signs and the nursing notes.   HISTORY  Chief Complaint Migraine    HPI Brittney [name] is a 36 y.o. female who was in her usual state of health until about 9:30 AM today when she started having her typical migraine aura seeing flashing lights and halos. 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 This is all very typical of her migraine pattern although she had some pain behind the right eye which is different. Denies any paresthesias or weakness. Blurry vision has since resolved. No recent trauma fevers chills. No neck stiffness.     Past Medical History:  Diagnosis Date  . Anemia   . Colon polyps   . Coronary artery abnormality    spasms  . 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 Fibroids   . Gallstones   . Irritable bowel syndrome   . Migraine   . Obesity, unspecified      Patient Active Problem List   Diagnosis Date Noted  . Skin pustule 04/19/2017  . Rectal irritation 05/18/2016  . Gastritis 03/02/2016  . Contusion 02/19/2016  . Fibrocystic breast changes 01/08/2016  . Breast lump on right side at 5 o'clock position 01/08/2016  . Hemorrhoids, external 09/30/2014  . Chronic diarrhea 08/29/2014  . 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 Hemorrhoids, internal, with bleeding 04/22/2014  . Family history of pulmonary embolism 09/02/2013  . Family history of diabetes mellitus 09/02/2013  . Cutaneous skin tags 05/15/2013  . Sebaceous cyst of left axilla 05/15/2013  . DUB (dysfunctional uterine bleeding) 09/24/2012  . Endometritis 09/24/2012  . 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 EDEMA 11/04/2009  . Morbid obesity ([place]) 03/31/2008  . HEARING LOSS, MILD  03/31/2008  . MIGRAINE HEADACHE 02/20/2007  . IBS 02/20/2007  . OSTEOARTHRITIS 02/20/2007     Past Surgical History:  Procedure Laterality Date  . CHOLECYSTECTOMY  05/06/11  . COLONOSCOPY  11/2001   polyp; negative pathology  . COLONOSCOPY  09/2004   Negative  . COLONOSCOPY W/ POLYPECTOMY    . ESOPHAGOGASTRODUODENOSCOPY  1/10   normal  . FLEXIBLE SIGMOIDOSCOPY  03/05/2012   Procedure: FLEXIBLE SIGMOIDOSCOPY;  Surgeon: Inda Castle, MD;  Location: [facility] ENDOSCOPY;  Service: Endoscopy;  Laterality: N/A;  . LAPAROSCOPIC DECORTICATION / DUBULKING / ABLATION RENAL CYSTS     saline histogram  . TONSILLECTOMY  07/30/07  . Uterine US  09/2005   Large endometrial stripe; small ovarian cyst     Prior to Admission medications   Medication Sig Start Date End Date Taking? Authorizing Provider  cetirizine (ZYRTEC) 10 MG tablet Take 1 tablet (10 mg total) by mouth daily. Patient not taking: Reported on 04/19/2017 11/03/16   Jearld Fenton, NP  cholestyramine Lucrezia Starch) 4 g packet Take 1 packet (4 g total) by mouth 3 (three) times daily with meals. Patient not taking: Reported on 04/19/2017 03/03/17   Tower, Wynelle Fanny, MD  diphenhydrAMINE (BENADRYL) 25 mg capsule Take 2 capsules (50 mg total) by mouth every 6 (six) hours as needed. 07/11/17   Carrie Mew, MD  hydrocortisone (ANUSOL-HC) 25 MG suppository Place 1 suppository (25 mg total) rectally at bedtime. Patient not taking: Reported on 04/19/2017 03/27/17   Tower, Wynelle Fanny, MD  Iron-FA-B Cmp-C-Biot-Probiotic (FUSION PLUS) CAPS Take 1 capsule by  mouth daily. Patient not taking: Reported on 04/19/2017 09/20/16   Mauri Pole, MD  metoCLOPramide (REGLAN) 10 MG tablet Take 1 tablet (10 mg total) by mouth every 6 (six) hours as needed. 07/11/17   Carrie Mew, MD  Naltrexone-Bupropion HCl ER 8-90 MG TB12 Take 2 tablets by mouth 2 (two) times daily. Patient not taking: Reported on 04/19/2017 03/03/17   Tower, Wynelle Fanny, MD  naproxen (NAPROSYN) 500 MG  tablet Take 1 tablet (500 mg total) by mouth 2 (two) times daily with a meal. 07/11/17   Carrie Mew, MD  nitroGLYCERIN (NITROSTAT) 0.4 MG SL tablet Place 1 tablet (0.4 mg total) under the tongue every 5 (five) minutes as needed for chest pain. Patient not taking: Reported on 04/19/2017 07/03/14   Wellington Hampshire, MD  sulfamethoxazole-trimethoprim (BACTRIM DS,SEPTRA DS) 800-160 MG tablet Take 1 tablet by mouth 2 (two) times daily. 04/19/17   Ria Bush, MD     Allergies Prevacid [lansoprazole]   Family History  Problem Relation Age of Onset  . Heart disease Father   . Hypertension Father   . Heart attack Father   . Diabetes Father   . Ulcers Brother   . Clotting disorder Brother   . Ovarian cancer Maternal Grandmother   . Colon polyps Maternal Grandmother   . Colon cancer Maternal Grandmother        40's  . Diabetes Paternal Grandmother   . Hypertension Paternal Grandmother   . Breast cancer Maternal Aunt        Age 65's  . Diabetes Paternal Aunt        x 5  . Lung cancer Maternal Grandfather        SMOKER    Social History Social History   Tobacco Use  . Smoking status: Never Smoker  . Smokeless tobacco: Never Used  Substance Use Topics  . Alcohol use: No    Alcohol/week: 0.0 oz  . Drug use: No    Review of Systems  Constitutional:   No fever or chills.  ENT:   No sore throat. No rhinorrhea. Cardiovascular:   No chest pain or syncope. Respiratory:   No dyspnea or cough. Gastrointestinal:   Negative for abdominal pain, vomiting and diarrhea.  Musculoskeletal:   Negative for focal pain or swelling All other systems reviewed and are negative except as documented above in ROS and HPI.  ____________________________________________   PHYSICAL EXAM:  VITAL SIGNS: ED Triage Vitals [07/11/17 1715]  Enc Vitals Group     BP 127/83     Pulse Rate 66     Resp 16     Temp 98.3 F (36.8 C)     Temp Source Oral     SpO2 100 %     Weight 288 lb (130.6  kg)     Height 5\' 8"  (1.727 m)     Head Circumference      Peak Flow      Pain Score 8     Pain Loc      Pain Edu?      Excl. in [place]?     Vital signs reviewed, nursing assessments reviewed.   Constitutional:   Alert and oriented. Well appearing and in no distress. Eyes:   No scleral icterus.  EOMI. No nystagmus. No conjunctival pallor. PERRL. ENT   Head:   Normocephalic and atraumatic.   Nose:   No congestion/rhinnorhea.    Mouth/Throat:   MMM, no pharyngeal erythema. No peritonsillar mass.    Neck:  No meningismus. Full ROM. Bilateral soft tissue tenderness of the neck musculature without inflammatory changes Hematological/Lymphatic/Immunilogical:   No cervical lymphadenopathy. Cardiovascular:   RRR. Symmetric bilateral radial and DP pulses.  No murmurs.  Respiratory:   Normal respiratory effort without tachypnea/retractions. Breath sounds are clear and equal bilaterally. No wheezes/rales/rhonchi. Gastrointestinal:   Soft and nontender. Non distended. There is no CVA tenderness.  No rebound, rigidity, or guarding. Genitourinary:   deferred Musculoskeletal:   Normal range of motion in all extremities. No joint effusions.  No lower extremity tenderness.  No edema. Neurologic:   Normal speech and language.  Cranial nerves III through XII intact Motor grossly intact. No gross focal neurologic deficits are appreciated.  Skin:    Skin is warm, dry and intact. No rash noted.  No petechiae, purpura, or bullae.  ____________________________________________    LABS (pertinent positives/negatives) (all labs ordered are listed, but only abnormal results are displayed) Labs Reviewed - No data to display ____________________________________________   EKG    ____________________________________________    RADIOLOGY  Ct Head Wo Contrast  Result Date: 07/11/2017 CLINICAL DATA:  Acute headache. Normal neuro exam. Right eye vision loss lasting 10 minutes. Subsequent  headache and neck pain. EXAM: CT HEAD WITHOUT CONTRAST TECHNIQUE: Contiguous axial images were obtained from the base of the skull through the vertex without intravenous contrast. COMPARISON:  01/08/2008 FINDINGS: Brain: No evidence of acute infarction, hemorrhage, hydrocephalus, extra-axial collection or mass lesion/mass effect. Clear suprasellar cistern. Negative for white matter disease or atrophy. Vascular: No hyperdense vessel or unexpected calcification. Skull: Normal. Negative for fracture or focal lesion. Sinuses/Orbits: No acute finding. IMPRESSION: Normal exam. Electronically Signed   By: Monte Fantasia M.D.   On: 07/11/2017 18:01    ____________________________________________   PROCEDURES Procedures  ____________________________________________   DIFFERENTIAL DIAGNOSIS  Intracranial hemorrhage, migraine headache  CLINICAL IMPRESSION / ASSESSMENT AND PLAN / ED COURSE  Pertinent labs & imaging results that were available during my care of the patient were reviewed by me and considered in my medical decision making (see chart for details).   Patient presents with symptoms of classic migraine. She had one slightly atypical feature from her usual migraine pattern which is that she had some pain behind the right I specifically. For this reason she underwent a CT scan which was negative. I think the chance of a cerebral aneurysm is low enough that an angiogram is not warranted. Her symptoms otherwise are entirely consistent with her migraine pattern and typical of migraines in general. Considering the patient's symptoms, medical history, and physical examination today, I have low suspicion for ischemic stroke, intracranial hemorrhage, meningitis, encephalitis, carotid or vertebral dissection, venous sinus thrombosis, MS, intracranial hypertension, glaucoma, CRAO, CRVO, or temporal arteritis. Patient given migraine cocktail, feels better, suitable for discharge home.       ____________________________________________   FINAL CLINICAL IMPRESSION(S) / ED DIAGNOSES    Final diagnoses:  Migraine with aura and without status migrainosus, not intractable      This SmartLink is deprecated. Use AVSMEDLIST instead to display the medication list for a patient.   Portions of this note were generated with dragon dictation software. Dictation errors may occur despite best attempts at proofreading.    Carrie Mew, MD 07/11/17 [phone]

## 2017-07-11 NOTE — ED Triage Notes (Signed)
Pt report she was at work and developed right eye vision loss that lasted approx 55min. Per patient then developed headache and neck pain. Patient states vision improved, now blurry, continues to have HA and neck pain. Pt with hx of migraines. Stroke scale neg.  Pt alert, oriented x4, VSS, appropriate.

## 2017-07-11 NOTE — ED Notes (Signed)
Patient is complaining of headache that began around 9:30am with an episode of blurry vision in her right eye that lasted approx. 10 minutes.  Patient states her vision is now at baseline.  Patient reports normal migraine symptoms at this time.  Patient reports photophobia.

## 2017-07-11 NOTE — ED Triage Notes (Addendum)
Pt reports that she was at work today and began to have migraine approx 9 AM. Right eye vision became blurry. Nausea, denies emesis. Pt denies blurry vision at this time. Headache continues and pain behind right eye. Pt alert and oriented X4, active, cooperative, pt in NAD. RR even and unlabored, color WNL.  Pt wearing sunglasses-photosensitivity. No weakness of any extremity, normal speech. Pt hx of migraines, no hx of pain behind with migraine

## 2017-07-11 NOTE — ED Notes (Signed)
Pt called to be roomed x2. No response in lobby

## 2017-07-28 ENCOUNTER — Ambulatory Visit: Payer: BC Managed Care – PPO | Admitting: Family Medicine

## 2017-08-04 ENCOUNTER — Ambulatory Visit: Payer: BC Managed Care – PPO | Admitting: Family Medicine

## 2017-08-04 ENCOUNTER — Encounter: Payer: Self-pay | Admitting: Family Medicine

## 2017-08-04 VITALS — BP 118/70 | HR 84 | Temp 98.3°F | Wt 291.0 lb

## 2017-08-04 DIAGNOSIS — J069 Acute upper respiratory infection, unspecified: Secondary | ICD-10-CM | POA: Diagnosis not present

## 2017-08-04 NOTE — Progress Notes (Signed)
Subjective:    Patient ID: Brittney [name], female    DOB: 1981/07/18, 36 y.o.   MRN: [id number]  HPI This is a 36 yo female who presents today with headache, sore throat and low grade fever x 2 days. No cough or ear pain. Has not been taking anything for symptoms. Has post nasal drainage, watery eyes.    Past Medical History:  Diagnosis Date  . Anemia   . Colon polyps   . Coronary artery abnormality    spasms  . 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 Fibroids   . Gallstones   . Irritable bowel syndrome   . Migraine   . Obesity, unspecified    Past Surgical History:  Procedure Laterality Date  . CHOLECYSTECTOMY  05/06/11  . COLONOSCOPY  11/2001   polyp; negative pathology  . COLONOSCOPY  09/2004   Negative  . COLONOSCOPY W/ POLYPECTOMY    . ESOPHAGOGASTRODUODENOSCOPY  1/10   normal  . FLEXIBLE SIGMOIDOSCOPY  03/05/2012   Procedure: FLEXIBLE SIGMOIDOSCOPY;  Surgeon: Inda Castle, MD;  Location: [facility];  Service: Endoscopy;  Laterality: N/A;  . LAPAROSCOPIC DECORTICATION / DUBULKING / ABLATION RENAL CYSTS     saline histogram  . TONSILLECTOMY  07/30/07  . Uterine US  09/2005   Large endometrial stripe; small ovarian cyst   Family History  Problem Relation Age of Onset  . Heart disease Father   . Hypertension Father   . Heart attack Father   . Diabetes Father   . Ulcers Brother   . Clotting disorder Brother   . Ovarian cancer Maternal Grandmother   . Colon polyps Maternal Grandmother   . Colon cancer Maternal Grandmother        40's  . Diabetes Paternal Grandmother   . Hypertension Paternal Grandmother   . Breast cancer Maternal Aunt        Age 63's  . Diabetes Paternal Aunt        x 5  . Lung cancer Maternal Grandfather        SMOKER   Social History   Tobacco Use  . Smoking status: Never Smoker  . Smokeless tobacco: Never Used  Substance Use Topics  . Alcohol use: No    Alcohol/week: 0.0 oz  . Drug use: No      Review of Systems Per HPI      Objective:   Physical Exam  Constitutional: She is oriented to person, place, and time. She appears well-developed and well-nourished. No distress.  HENT:  Head: Normocephalic and atraumatic.  Right Ear: Tympanic membrane, external ear and ear canal normal.  Left Ear: Tympanic membrane, external ear and ear canal normal.  Nose: Nose normal.  Mouth/Throat: Uvula is midline, oropharynx is clear and moist and mucous membranes are normal.  Eyes: Conjunctivae are normal.  Neck: Normal range of motion. Neck supple.  Cardiovascular: Normal rate, regular rhythm and normal heart sounds.  Pulmonary/Chest: Effort normal and breath sounds normal.  Lymphadenopathy:    She has no cervical adenopathy.  Neurological: She is alert and oriented to person, place, and time.  Skin: Skin is warm and dry. She is not diaphoretic.  Psychiatric: She has a normal mood and affect. Her behavior is normal. Judgment and thought content normal.  Vitals reviewed.     BP 118/70 (BP Location: Right Arm, Patient Position: Sitting, Cuff Size: Large)   Pulse 84   Temp 98.3 F (36.8 C) (Oral)   Wt 291 lb (132 kg)  LMP 07/31/2017   SpO2 98%   BMI 44.25 kg/m  Wt Readings from Last 3 Encounters:  08/04/17 291 lb (132 kg)  07/11/17 288 lb (130.6 kg)  04/19/17 292 lb 1.9 oz (132.5 kg)       Assessment & Plan:  1. Viral URI Patient Instructions  For nasal congestion you can use Afrin nasal spray for 3 days max, Sudafed, saline nasal spray (generic is fine for all). Take your over the counter antihistamine and can use antihistamine eye drops (over the counter) For cough you can try Delsym. Drink enough fluids to make your urine light yellow. For fever/chill/muscle aches you can take over the counter acetaminophen or ibuprofen.  Please come back in if you are not better in 5-7 days or if you develop wheezing, shortness of breath or persistent vomiting.       Clarene Reamer, FNP-BC  [facility]  at [facility] [place] [facility]  08/04/2017 2:26 PM

## 2017-08-04 NOTE — Patient Instructions (Signed)
For nasal congestion you can use Afrin nasal spray for 3 days max, Sudafed, saline nasal spray (generic is fine for all). Take your over the counter antihistamine and can use antihistamine eye drops (over the counter) For cough you can try Delsym. Drink enough fluids to make your urine light yellow. For fever/chill/muscle aches you can take over the counter acetaminophen or ibuprofen.  Please come back in if you are not better in 5-7 days or if you develop wheezing, shortness of breath or persistent vomiting.

## 2017-08-18 ENCOUNTER — Encounter: Payer: Self-pay | Admitting: Family Medicine

## 2017-08-18 DIAGNOSIS — K529 Noninfective gastroenteritis and colitis, unspecified: Secondary | ICD-10-CM

## 2017-08-24 ENCOUNTER — Telehealth: Payer: Self-pay | Admitting: Family Medicine

## 2017-08-24 NOTE — Telephone Encounter (Signed)
I just ref pt to allergist-please call her  I forgot to route it to you Thanks

## 2017-08-28 NOTE — Telephone Encounter (Signed)
Appt made and patient aware.  

## 2017-08-29 ENCOUNTER — Telehealth (INDEPENDENT_AMBULATORY_CARE_PROVIDER_SITE_OTHER): Payer: BC Managed Care – PPO | Admitting: Family Medicine

## 2017-08-29 ENCOUNTER — Encounter: Payer: Self-pay | Admitting: Family Medicine

## 2017-08-29 ENCOUNTER — Other Ambulatory Visit (INDEPENDENT_AMBULATORY_CARE_PROVIDER_SITE_OTHER): Payer: BC Managed Care – PPO

## 2017-08-29 ENCOUNTER — Other Ambulatory Visit: Payer: BC Managed Care – PPO

## 2017-08-29 DIAGNOSIS — Z0000 Encounter for general adult medical examination without abnormal findings: Secondary | ICD-10-CM

## 2017-08-29 LAB — COMPREHENSIVE METABOLIC PANEL
ALT: 12 U/L (ref 0–35)
AST: 14 U/L (ref 0–37)
Albumin: 4 g/dL (ref 3.5–5.2)
Alkaline Phosphatase: 73 U/L (ref 39–117)
BILIRUBIN TOTAL: 0.4 mg/dL (ref 0.2–1.2)
BUN: 10 mg/dL (ref 6–23)
CALCIUM: 9.4 mg/dL (ref 8.4–10.5)
CO2: 27 meq/L (ref 19–32)
CREATININE: 0.87 mg/dL (ref 0.40–1.20)
Chloride: 106 mEq/L (ref 96–112)
GFR: 93.49 mL/min (ref >60.00)
GLUCOSE: 82 mg/dL (ref 70–99)
Potassium: 4.3 mEq/L (ref 3.5–5.1)
SODIUM: 139 meq/L (ref 135–145)
Total Protein: 7.2 g/dL (ref 6.0–8.3)

## 2017-08-29 LAB — CBC WITH DIFFERENTIAL/PLATELET
BASOS ABS: 0 10*3/uL (ref 0.0–0.1)
Basophils Relative: 0.4 % (ref 0.0–3.0)
EOS ABS: 0.1 10*3/uL (ref 0.0–0.7)
Eosinophils Relative: 1.6 % (ref 0.0–5.0)
HEMATOCRIT: 39 % (ref 36.0–46.0)
Hemoglobin: 12.4 g/dL (ref 12.0–15.0)
LYMPHS PCT: 24.7 % (ref 12.0–46.0)
Lymphs Abs: 1.9 10*3/uL (ref 0.7–4.0)
MCHC: 31.7 g/dL (ref 30.0–36.0)
MCV: 82 fl (ref 78.0–100.0)
MONO ABS: 0.5 10*3/uL (ref 0.1–1.0)
Monocytes Relative: 6.3 % (ref 3.0–12.0)
NEUTROS ABS: 5.2 10*3/uL (ref 1.4–7.7)
NEUTROS PCT: 67 % (ref 43.0–77.0)
PLATELETS: 317 10*3/uL (ref 150.0–400.0)
RBC: 4.76 Mil/uL (ref 3.87–5.11)
RDW: 17.6 % — AB (ref 11.5–15.5)
WBC: 7.8 10*3/uL (ref 4.0–10.5)

## 2017-08-29 LAB — LIPID PANEL
CHOL/HDL RATIO: 4
Cholesterol: 154 mg/dL (ref 0–200)
HDL: 43.7 mg/dL (ref >39.00)
LDL CALC: 101 mg/dL — AB (ref 0–99)
NONHDL: 109.86
Triglycerides: 44 mg/dL (ref 0.0–149.0)
VLDL: 8.8 mg/dL (ref 0.0–40.0)

## 2017-08-29 LAB — TSH: TSH: 1.76 u[IU]/mL (ref 0.35–4.50)

## 2017-08-29 LAB — T4, FREE: FREE T4: 0.71 ng/dL (ref 0.60–1.60)

## 2017-08-29 NOTE — Telephone Encounter (Signed)
Lab orders for later today

## 2017-09-04 ENCOUNTER — Ambulatory Visit: Payer: BC Managed Care – PPO | Admitting: Family Medicine

## 2017-09-04 ENCOUNTER — Encounter: Payer: Self-pay | Admitting: Family Medicine

## 2017-09-04 VITALS — BP 104/66 | HR 60 | Temp 98.1°F | Ht 66.5 in | Wt 286.0 lb

## 2017-09-04 DIAGNOSIS — N938 Other specified abnormal uterine and vaginal bleeding: Secondary | ICD-10-CM | POA: Diagnosis not present

## 2017-09-04 DIAGNOSIS — K529 Noninfective gastroenteritis and colitis, unspecified: Secondary | ICD-10-CM | POA: Diagnosis not present

## 2017-09-04 DIAGNOSIS — Z0000 Encounter for general adult medical examination without abnormal findings: Secondary | ICD-10-CM

## 2017-09-04 DIAGNOSIS — I201 Angina pectoris with documented spasm: Secondary | ICD-10-CM

## 2017-09-04 DIAGNOSIS — Z23 Encounter for immunization: Secondary | ICD-10-CM | POA: Diagnosis not present

## 2017-09-04 MED ORDER — CHOLESTYRAMINE 4 G PO PACK: 4.0000 g | PACK | Freq: Three times a day (TID) | ORAL | 11 refills | Status: DC

## 2017-09-04 NOTE — Assessment & Plan Note (Signed)
Reviewed health habits including diet and exercise and skin cancer prevention Reviewed appropriate screening tests for age  Also reviewed health mt list, fam hx and immunization status , as well as social and family history   See HPI Labs reviewed  Wt loss plan rev  Tdap today

## 2017-09-04 NOTE — Patient Instructions (Addendum)
Keep working on [employer]  Keep drinking water  Also add exercise when you can   Try to get most of your carbohydrates from produce (with the exception of white potatoes)  Eat less bread/pasta/rice/snack foods/cereals/sweets and other items from the middle of the grocery store (processed carbs)   Labs look good   Take care

## 2017-09-04 NOTE — Progress Notes (Signed)
Subjective:    Patient ID: Brittney [name], female    DOB: 1981/05/14, 37 y.o.   MRN: [id number]  HPI  Here for health maintenance exam and to review chronic medical problems   Doing well overall  Working and in school teaching  2 y staff dev project in [place] also -enjoys it    [facility] Readings from Last 3 Encounters:  09/04/17 286 lb (129.7 kg)  08/04/17 291 lb (132 kg)  07/11/17 288 lb (130.6 kg)  last visit px naltrexone-bupropion ER 8-90 She took it for a short time only (she decided to change gears)- no side effects  Doing better with her diet and water intake  Avoiding gluten helps and eating less carbs  Not a lot of time for exercise  45.47 kg/m   Pt emailed that she is seeing a naturopathic doctor and will need a copy of labs Needed a full thyroid panel instead of just tsh  Lab Results  Component Value Date   TSH 1.76 08/29/2017    Ft4 is nl at 0.71   Pap - oct 2018  H/o fibroids /heavy menses  Has several fibroids - deciding on options  She does not desire more kids   Self breast exam -no lumps  Has fibrocystic change  Did get a water burn on breast-that is healing  Hx of mammogram and [place] in 2017 for a lump/ seb cyst Maunt had breast cancer  Mgm had ovarian cancer   Flu shot 12/18  Tetanus shot 8/09 Wants to get it today   Colonoscopy 2/18 -nl except for int hemorrhoids  10 year recall per report  MGM had colon cancer  She takes colestyramine when needed for chronic diarrhea   HIV screen neg in 2012  Cholesterol Lab Results  Component Value Date   CHOL 154 08/29/2017   CHOL 150 08/29/2014   CHOL 155 09/02/2013   Lab Results  Component Value Date   HDL 43.70 08/29/2017   HDL 39.20 08/29/2014   HDL 41 09/02/2013   Lab Results  Component Value Date   LDLCALC 101 (H) 08/29/2017   LDLCALC 103 (H) 08/29/2014   LDLCALC 103 (H) 09/02/2013   Lab Results  Component Value Date   TRIG 44.0 08/29/2017   TRIG 38.0 08/29/2014   TRIG 57 09/02/2013    Lab Results  Component Value Date   CHOLHDL 4 08/29/2017   CHOLHDL 4 08/29/2014   CHOLHDL 3.8 09/02/2013   No results found for: LDLDIRECT   Other labs: Results for orders placed or performed in visit on 08/29/17  CBC with Differential/Platelet  Result Value Ref Range   WBC 7.8 4.0 - 10.5 K/uL   RBC 4.76 3.87 - 5.11 Mil/uL   Hemoglobin 12.4 12.0 - 15.0 g/dL   HCT 39.0 36.0 - 46.0 %   MCV 82.0 78.0 - 100.0 fl   MCHC 31.7 30.0 - 36.0 g/dL   RDW 17.6 (H) 11.5 - 15.5 %   Platelets 317.0 150.0 - 400.0 K/uL   Neutrophils Relative % 67.0 43.0 - 77.0 %   Lymphocytes Relative 24.7 12.0 - 46.0 %   Monocytes Relative 6.3 3.0 - 12.0 %   Eosinophils Relative 1.6 0.0 - 5.0 %   Basophils Relative 0.4 0.0 - 3.0 %   Neutro Abs 5.2 1.4 - 7.7 K/uL   Lymphs Abs 1.9 0.7 - 4.0 K/uL   Monocytes Absolute 0.5 0.1 - 1.0 K/uL   Eosinophils Absolute 0.1 0.0 - 0.7 K/uL  Basophils Absolute 0.0 0.0 - 0.1 K/uL  Comprehensive metabolic panel  Result Value Ref Range   Sodium 139 135 - 145 mEq/L   Potassium 4.3 3.5 - 5.1 mEq/L   Chloride 106 96 - 112 mEq/L   CO2 27 19 - 32 mEq/L   Glucose, Bld 82 70 - 99 mg/dL   BUN 10 6 - 23 mg/dL   Creatinine, Ser 0.87 0.40 - 1.20 mg/dL   Total Bilirubin 0.4 0.2 - 1.2 mg/dL   Alkaline Phosphatase 73 39 - 117 U/L   AST 14 0 - 37 U/L   ALT 12 0 - 35 U/L   Total Protein 7.2 6.0 - 8.3 g/dL   Albumin 4.0 3.5 - 5.2 g/dL   Calcium 9.4 8.4 - 10.5 mg/dL   GFR 93.49 >60.00 mL/min  Lipid panel  Result Value Ref Range   Cholesterol 154 0 - 200 mg/dL   Triglycerides 44.0 0.0 - 149.0 mg/dL   HDL 43.70 >39.00 mg/dL   VLDL 8.8 0.0 - 40.0 mg/dL   LDL Cholesterol 101 (H) 0 - 99 mg/dL   Total CHOL/HDL Ratio 4    NonHDL 109.86   TSH  Result Value Ref Range   TSH 1.76 0.35 - 4.50 uIU/mL  T4, Free  Result Value Ref Range   Free T4 0.71 0.60 - 1.60 ng/dL    Patient Active Problem List   Diagnosis Date Noted  . Routine general medical examination at a health care  facility 08/29/2017  . Skin pustule 04/19/2017  . Rectal irritation 05/18/2016  . Gastritis 03/02/2016  . Fibrocystic breast changes 01/08/2016  . Hemorrhoids, external 09/30/2014  . Chronic diarrhea 08/29/2014  . 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 Hemorrhoids, internal, with bleeding 04/22/2014  . Family history of pulmonary embolism 09/02/2013  . Family history of diabetes mellitus 09/02/2013  . Cutaneous skin tags 05/15/2013  . Sebaceous cyst of left axilla 05/15/2013  . DUB (dysfunctional uterine bleeding) 09/24/2012  . 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 EDEMA 11/04/2009  . Morbid obesity ([place]) 03/31/2008  . HEARING LOSS, MILD 03/31/2008  . MIGRAINE HEADACHE 02/20/2007  . IBS 02/20/2007  . OSTEOARTHRITIS 02/20/2007   Past Medical History:  Diagnosis Date  . Anemia   . Colon polyps   . Coronary artery abnormality    spasms  . 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 Fibroids   . Gallstones   . Irritable bowel syndrome   . Migraine   . Obesity, unspecified    Past Surgical History:  Procedure Laterality Date  . CHOLECYSTECTOMY  05/06/11  . COLONOSCOPY  11/2001   polyp; negative pathology  . COLONOSCOPY  09/2004   Negative  . COLONOSCOPY W/ POLYPECTOMY    . ESOPHAGOGASTRODUODENOSCOPY  1/10   normal  . FLEXIBLE SIGMOIDOSCOPY  03/05/2012   Procedure: FLEXIBLE SIGMOIDOSCOPY;  Surgeon: Inda Castle, MD;  Location: [facility];  Service: Endoscopy;  Laterality: N/A;  . LAPAROSCOPIC DECORTICATION / DUBULKING / ABLATION RENAL CYSTS     saline histogram  . TONSILLECTOMY  07/30/07  . Uterine US  09/2005   Large endometrial stripe; small ovarian cyst   Social History   Tobacco Use  . Smoking status: Never Smoker  . Smokeless tobacco: Never Used  Substance Use Topics  . Alcohol use: No    Alcohol/week: 0.0 oz  . Drug use: No   Family History  Problem Relation Age of Onset  . Heart disease Father   . Hypertension Father   .  Heart attack Father   . Diabetes  Father   . Ulcers Brother   . Clotting disorder Brother   . Ovarian cancer Maternal Grandmother   . Colon polyps Maternal Grandmother   . Colon cancer Maternal Grandmother        40's  . Diabetes Paternal Grandmother   . Hypertension Paternal Grandmother   . Breast cancer Maternal Aunt        Age 75's  . Diabetes Paternal Aunt        x 5  . Lung cancer Maternal Grandfather        SMOKER   Allergies  Allergen Reactions  . Prevacid [Lansoprazole]     REACTION: rash   Current Outpatient Medications on File Prior to Visit  Medication Sig Dispense Refill  . cetirizine (ZYRTEC) 10 MG tablet Take 1 tablet (10 mg total) by mouth daily. 30 tablet 0  . diphenhydrAMINE (BENADRYL) 25 mg capsule Take 2 capsules (50 mg total) by mouth every 6 (six) hours as needed. 60 capsule 0  . fluticasone (FLONASE) 50 MCG/ACT nasal spray Place into the nose.    . hydrocortisone (ANUSOL-HC) 25 MG suppository Place 1 suppository (25 mg total) rectally at bedtime. 14 suppository 0  . ibuprofen (ADVIL,MOTRIN) 800 MG tablet Take by mouth.    . Iron-FA-B Cmp-C-Biot-Probiotic (FUSION PLUS) CAPS Take 1 capsule by mouth daily. 30 capsule 3  . metoCLOPramide (REGLAN) 10 MG tablet Take 1 tablet (10 mg total) by mouth every 6 (six) hours as needed. 30 tablet 0  . naproxen (NAPROSYN) 500 MG tablet Take 1 tablet (500 mg total) by mouth 2 (two) times daily with a meal. 20 tablet 0  . nitroGLYCERIN (NITROSTAT) 0.4 MG SL tablet Place 1 tablet (0.4 mg total) under the tongue every 5 (five) minutes as needed for chest pain. 25 tablet 6   Current Facility-Administered Medications on File Prior to Visit  Medication Dose Route Frequency Provider Last Rate Last Dose  . 0.9 %  sodium chloride infusion  500 mL Intravenous Continuous Nandigam, Kavitha V, MD          Review of Systems  Constitutional: Negative for activity change, appetite change, fatigue, fever and unexpected weight change.  HENT: Negative for congestion, ear  pain, rhinorrhea, sinus pressure and sore throat.   Eyes: Negative for pain, redness and visual disturbance.  Respiratory: Negative for cough, shortness of breath and wheezing.   Cardiovascular: Negative for chest pain and palpitations.  Gastrointestinal: Negative for abdominal pain, blood in stool, constipation and diarrhea.  Endocrine: Negative for polydipsia and polyuria.  Genitourinary: Negative for dysuria, frequency and urgency.  Musculoskeletal: Negative for arthralgias, back pain and myalgias.  Skin: Negative for pallor and rash.  Allergic/Immunologic: Negative for environmental allergies.  Neurological: Negative for dizziness, syncope and headaches.  Hematological: Negative for adenopathy. Does not bruise/bleed easily.  Psychiatric/Behavioral: Negative for decreased concentration and dysphoric mood. The patient is not nervous/anxious.        Objective:   Physical Exam  Constitutional: She appears well-developed and well-nourished. No distress.  obese and well appearing   HENT:  Head: Normocephalic and atraumatic.  Right Ear: External ear normal.  Left Ear: External ear normal.  Nose: Nose normal.  Mouth/Throat: Oropharynx is clear and moist.  Eyes: Conjunctivae and EOM are normal. Pupils are equal, round, and reactive to light. Right eye exhibits no discharge. Left eye exhibits no discharge. No scleral icterus.  Neck: Normal range of motion. Neck supple. No JVD present. Carotid  bruit is not present. No thyromegaly present.  Cardiovascular: Normal rate, regular rhythm, normal heart sounds and intact distal pulses. Exam reveals no gallop.  Pulmonary/Chest: Effort normal and breath sounds normal. No respiratory distress. She has no wheezes. She has no rales.  Abdominal: Soft. Bowel sounds are normal. She exhibits no distension and no mass. There is no tenderness.  No suprapubic tenderness or fullness    Musculoskeletal: She exhibits no edema or tenderness.  Sock line noted on  legs w/o pitting edema   Lymphadenopathy:    She has no cervical adenopathy.  Neurological: She is alert. She has normal reflexes. No cranial nerve deficit. She exhibits normal muscle tone. Coordination normal.  Skin: Skin is warm and dry. No rash noted. No erythema. No pallor.  Several skin tags under breasts   Psychiatric: She has a normal mood and affect.          Assessment & Plan:   Problem List Items Addressed This Visit      Cardiovascular and Mediastinum   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HCC)    No cp recently Clinically stable       Relevant Medications   cholestyramine (QUESTRAN) 4 g packet     Digestive   Chronic diarrhea    Refilled cholestyramine which is helpful        Other   DUB (dysfunctional uterine bleeding)    From uterine fibroids Pt will f/u with her gyn  Considering surgery       Morbid obesity ([place])    Discussed how this problem influences overall health and the risks it imposes  Reviewed plan for weight loss with lower calorie diet (via better food choices and also portion control or program like weight watchers) and exercise building up to or more than 30 minutes 5 days per week including some aerobic activity   Pt did not stick with the naltrexone-buproprion  Working on healthy diet and exercise  Plans to see a naturopath       Routine general medical examination at a health care facility - Primary    Reviewed health habits including diet and exercise and skin cancer prevention Reviewed appropriate screening tests for age  Also reviewed health mt list, fam hx and immunization status , as well as social and family history   See HPI Labs reviewed  Wt loss plan rev  Tdap today         Other Visit Diagnoses    Need for Tdap vaccination       Relevant Orders   Tdap vaccine greater than or equal to 7yo IM (Completed)

## 2017-09-04 NOTE — Assessment & Plan Note (Signed)
From uterine fibroids Pt will f/u with her gyn  Considering surgery

## 2017-09-04 NOTE — Assessment & Plan Note (Signed)
Discussed how this problem influences overall health and the risks it imposes  Reviewed plan for weight loss with lower calorie diet (via better food choices and also portion control or program like weight watchers) and exercise building up to or more than 30 minutes 5 days per week including some aerobic activity   Pt did not stick with the naltrexone-buproprion  Working on healthy diet and exercise  Plans to see a naturopath

## 2017-09-04 NOTE — Assessment & Plan Note (Signed)
No cp recently Clinically stable

## 2017-09-04 NOTE — Assessment & Plan Note (Signed)
Refilled cholestyramine which is helpful

## 2017-09-25 ENCOUNTER — Encounter: Payer: Self-pay | Admitting: Family Medicine

## 2017-09-25 ENCOUNTER — Ambulatory Visit: Payer: BC Managed Care – PPO | Admitting: Family Medicine

## 2017-09-25 ENCOUNTER — Telehealth: Payer: Self-pay

## 2017-09-25 VITALS — BP 118/80 | HR 71 | Temp 98.0°F | Resp 12 | Wt 295.0 lb

## 2017-09-25 DIAGNOSIS — B9789 Other viral agents as the cause of diseases classified elsewhere: Secondary | ICD-10-CM | POA: Diagnosis not present

## 2017-09-25 DIAGNOSIS — J069 Acute upper respiratory infection, unspecified: Secondary | ICD-10-CM

## 2017-09-25 MED ORDER — HYDROCODONE-HOMATROPINE 5-1.5 MG/5ML PO SYRP
5.0000 mL | ORAL_SOLUTION | Freq: Three times a day (TID) | ORAL | 0 refills | Status: DC | PRN
Start: 2017-09-25 — End: 2018-01-04

## 2017-09-25 NOTE — Telephone Encounter (Signed)
I spoke with pt [facility] was given tessalon perle, mucinex and albuterol inhaler; not helping non prod cough and congestion. No wheezing or SOB. Pt has 30' appt to see Dr Glori Bickers 09/25/17 at 4pm.

## 2017-09-25 NOTE — Assessment & Plan Note (Signed)
Supportive care Reassuring exam  hydocan for refractory cough when not working or driving  Tessalon tid and guifen dm prn  Update if not starting to improve in a week or if worsening    Meds ordered this encounter  Medications  . HYDROcodone-homatropine (HYCODAN) 5-1.5 MG/5ML syrup    Sig: Take 5 mLs by mouth every 8 (eight) hours as needed for cough. When not working or driving, caution of sedation    Dispense:  100 mL    Refill:  0

## 2017-09-25 NOTE — Progress Notes (Signed)
Subjective:    Patient ID: Brittney [name], female    DOB: 04-Sep-1980, 37 y.o.   MRN: [id number]  HPI Here for uri symptoms   Started 2/7-cough getting worse and worse  [facility] and was given tessalon , albuterol mdi and mucinex  She had been wheezing a little  Flu test neg   Symptoms are not controlled  Chest and back hurt from coughing    Cough -keeps her up at night  Non prod - but hears rattle  No trouble breathing Coughing spasms where she can't stop  Lots of sick exp  Temp: 98 F (36.7 C)  Has not taken temp at home - has had some chills and aches  Pulse ox is 98% on RA  Ibuprofen otc   Ears bother her- full feeling  Throat- is sore  More congested nose than running Everything is clear    Patient Active Problem List   Diagnosis Date Noted  . Viral URI with cough 09/25/2017  . Routine general medical examination at a health care facility 08/29/2017  . Fibrocystic breast changes 01/08/2016  . Hemorrhoids, external 09/30/2014  . Chronic diarrhea 08/29/2014  . 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 Hemorrhoids, internal, with bleeding 04/22/2014  . Family history of pulmonary embolism 09/02/2013  . Family history of diabetes mellitus 09/02/2013  . Cutaneous skin tags 05/15/2013  . DUB (dysfunctional uterine bleeding) 09/24/2012  . 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 EDEMA 11/04/2009  . Morbid obesity ([place]) 03/31/2008  . HEARING LOSS, MILD 03/31/2008  . MIGRAINE HEADACHE 02/20/2007  . IBS 02/20/2007  . OSTEOARTHRITIS 02/20/2007   Past Medical History:  Diagnosis Date  . Anemia   . Colon polyps   . Coronary artery abnormality    spasms  . 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 Fibroids   . Gallstones   . Irritable bowel syndrome   . Migraine   . Obesity, unspecified    Past Surgical History:  Procedure Laterality Date  . CHOLECYSTECTOMY  05/06/11  . COLONOSCOPY  11/2001   polyp; negative pathology  . COLONOSCOPY  09/2004     Negative  . COLONOSCOPY W/ POLYPECTOMY    . ESOPHAGOGASTRODUODENOSCOPY  1/10   normal  . FLEXIBLE SIGMOIDOSCOPY  03/05/2012   Procedure: FLEXIBLE SIGMOIDOSCOPY;  Surgeon: Inda Castle, MD;  [facility];  Service: Endoscopy;  Laterality: N/A;  . LAPAROSCOPIC DECORTICATION / DUBULKING / ABLATION RENAL CYSTS     saline histogram  . TONSILLECTOMY  07/30/07  . Uterine US  09/2005   Large endometrial stripe; small ovarian cyst   Social History   Tobacco Use  . Smoking status: Never Smoker  . Smokeless tobacco: Never Used  Substance Use Topics  . Alcohol use: No    Alcohol/week: 0.0 oz  . Drug use: No   Family History  Problem Relation Age of Onset  . Heart disease Father   . Hypertension Father   . Heart attack Father   . Diabetes Father   . Ulcers Brother   . Clotting disorder Brother   . Ovarian cancer Maternal Grandmother   . Colon polyps Maternal Grandmother   . Colon cancer Maternal Grandmother        40's  . Diabetes Paternal Grandmother   . Hypertension Paternal Grandmother   . Breast cancer Maternal Aunt        Age 32's  . Diabetes Paternal Aunt        x 5  .  Lung cancer Maternal Grandfather        SMOKER   Allergies  Allergen Reactions  . Prevacid [Lansoprazole]     REACTION: rash   Current Outpatient Medications on File Prior to Visit  Medication Sig Dispense Refill  . cetirizine (ZYRTEC) 10 MG tablet Take 1 tablet (10 mg total) by mouth daily. 30 tablet 0  . cholestyramine (QUESTRAN) 4 g packet Take 1 packet (4 g total) by mouth 3 (three) times daily with meals. 90 each 11  . diphenhydrAMINE (BENADRYL) 25 mg capsule Take 2 capsules (50 mg total) by mouth every 6 (six) hours as needed. 60 capsule 0  . fluticasone (FLONASE) 50 MCG/ACT nasal spray Place into the nose.    . hydrocortisone (ANUSOL-HC) 25 MG suppository Place 1 suppository (25 mg total) rectally at bedtime. 14 suppository 0  . ibuprofen (ADVIL,MOTRIN) 800 MG tablet Take by mouth.     . Iron-FA-B Cmp-C-Biot-Probiotic (FUSION PLUS) CAPS Take 1 capsule by mouth daily. 30 capsule 3  . metoCLOPramide (REGLAN) 10 MG tablet Take 1 tablet (10 mg total) by mouth every 6 (six) hours as needed. 30 tablet 0  . naproxen (NAPROSYN) 500 MG tablet Take 1 tablet (500 mg total) by mouth 2 (two) times daily with a meal. 20 tablet 0  . nitroGLYCERIN (NITROSTAT) 0.4 MG SL tablet Place 1 tablet (0.4 mg total) under the tongue every 5 (five) minutes as needed for chest pain. 25 tablet 6   Current Facility-Administered Medications on File Prior to Visit  Medication Dose Route Frequency Provider Last Rate Last Dose  . 0.9 %  sodium chloride infusion  500 mL Intravenous Continuous Nandigam, Venia Minks, MD        Review of Systems  Constitutional: Positive for fatigue. Negative for activity change, appetite change, fever and unexpected weight change.  HENT: Positive for postnasal drip and sneezing. Negative for congestion, ear pain, rhinorrhea, sinus pressure and sore throat.   Eyes: Negative for pain, discharge, redness and visual disturbance.  Respiratory: Positive for cough. Negative for shortness of breath, wheezing and stridor.   Cardiovascular: Negative for chest pain and palpitations.  Gastrointestinal: Negative for abdominal pain, blood in stool, constipation, diarrhea, nausea and vomiting.  Endocrine: Negative for polydipsia and polyuria.  Genitourinary: Negative for dysuria, frequency, hematuria and urgency.  Musculoskeletal: Negative for arthralgias, back pain and myalgias.  Skin: Negative for pallor and rash.  Allergic/Immunologic: Negative for environmental allergies.  Neurological: Negative for dizziness, syncope, weakness, light-headedness and headaches.  Hematological: Negative for adenopathy. Does not bruise/bleed easily.  Psychiatric/Behavioral: Negative for confusion, decreased concentration and dysphoric mood. The patient is not nervous/anxious.        Objective:   Physical  Exam  Constitutional: She appears well-developed and well-nourished. No distress.  obese and well appearing   HENT:  Head: Normocephalic and atraumatic.  Right Ear: External ear normal.  Left Ear: External ear normal.  Mouth/Throat: Oropharynx is clear and moist.  Nares are injected and congested  No sinus tenderness Clear rhinorrhea and post nasal drip   Eyes: Conjunctivae and EOM are normal. Pupils are equal, round, and reactive to light. Right eye exhibits no discharge. Left eye exhibits no discharge.  Neck: Normal range of motion. Neck supple.  Cardiovascular: Normal rate and normal heart sounds.  Pulmonary/Chest: Effort normal and breath sounds normal. No respiratory distress. She has no wheezes. She has no rales. She exhibits no tenderness.  Harsh bs with good air exch No rales or rhonchi  Lymphadenopathy:  She has no cervical adenopathy.  Neurological: She is alert.  Skin: Skin is warm and dry. No rash noted.  Psychiatric: She has a normal mood and affect.          Assessment & Plan:   Problem List Items Addressed This Visit      Respiratory   Viral URI with cough - Primary    Supportive care Reassuring exam  hydocan for refractory cough when not working or driving  Tessalon tid and guifen dm prn  Update if not starting to improve in a week or if worsening    Meds ordered this encounter  Medications  . HYDROcodone-homatropine (HYCODAN) 5-1.5 MG/5ML syrup    Sig: Take 5 mLs by mouth every 8 (eight) hours as needed for cough. When not working or driving, caution of sedation    Dispense:  100 mL    Refill:  0

## 2017-09-25 NOTE — Telephone Encounter (Signed)
I will see her then  

## 2017-09-25 NOTE — Patient Instructions (Signed)
I think you have a viral head and chest cold  Drink lots of fluids Continue the tessalon  When working/driving-try mucinex DM  For pm/sleep - take the hycodan with caution of sedation  Ibuprofen is ok for headache/pain or fever    Update if not starting to improve in a week or if worsening

## 2017-09-25 NOTE — Telephone Encounter (Signed)
PLEASE NOTE: All timestamps contained within this report are represented as [place] Standard Time. CONFIDENTIALTY NOTICE: This fax transmission is intended only for the addressee. It contains information that is legally privileged, confidential or otherwise protected from use or disclosure. If you are not the intended recipient, you are strictly prohibited from reviewing, disclosing, copying using or disseminating any of this information or taking any action in reliance on or regarding this information. If you have received this fax in error, please notify us immediately by telephone so that we can arrange for its return to [place]. Phone: [phone], Toll-Free: [phone], Fax: [phone] Page: 1 of 1 Call Id: [id number] [facility] - Client Nonclinical Telephone Record [place] [facility] - Client Client Site [place] [facility], Roque Lias - MD Contact Type Call Who Is Calling Patient / Member / Family / Caregiver Caller Name [place] Phone Number [phone] Patient Name Emonee Winkowski Patient DOB [birth year] Call Type Message Only Information Provided Reason for Call Request to Schedule Office Appointment Initial Comment Leesa Glean [place] Nov 22, 1980 [phone] asking about the clinic at [facility] location. Additional Comment Transferred caller to the number to make an appt at [facility] today between 9-1pm. Call Closed By: Bonnita Nasuti Transaction Date/Time: 09/23/2017 9:30:28 AM (ET)

## 2017-10-03 ENCOUNTER — Telehealth: Payer: Self-pay | Admitting: *Deleted

## 2017-10-03 NOTE — Telephone Encounter (Signed)
Please ask pharmacy if there is an alt to that? Perhaps another brand? If not - does another pharmacy have it?   Thanks

## 2017-10-03 NOTE — Telephone Encounter (Signed)
Received fax from pharmacy saying that cholestyramine packets are on back order and request that Dr. Glori Bickers send her in an alt medication

## 2017-10-04 ENCOUNTER — Ambulatory Visit: Payer: BC Managed Care – PPO | Admitting: Family Medicine

## 2017-10-04 ENCOUNTER — Telehealth: Payer: Self-pay

## 2017-10-04 ENCOUNTER — Telehealth: Payer: Self-pay | Admitting: Family Medicine

## 2017-10-04 ENCOUNTER — Encounter: Payer: Self-pay | Admitting: Family Medicine

## 2017-10-04 VITALS — BP 122/72 | HR 70 | Temp 98.1°F | Ht 66.5 in | Wt 291.5 lb

## 2017-10-04 DIAGNOSIS — J069 Acute upper respiratory infection, unspecified: Secondary | ICD-10-CM | POA: Diagnosis not present

## 2017-10-04 DIAGNOSIS — J0100 Acute maxillary sinusitis, unspecified: Secondary | ICD-10-CM | POA: Diagnosis not present

## 2017-10-04 DIAGNOSIS — B9789 Other viral agents as the cause of diseases classified elsewhere: Secondary | ICD-10-CM | POA: Diagnosis not present

## 2017-10-04 DIAGNOSIS — D171 Benign lipomatous neoplasm of skin and subcutaneous tissue of trunk: Secondary | ICD-10-CM

## 2017-10-04 DIAGNOSIS — J019 Acute sinusitis, unspecified: Secondary | ICD-10-CM | POA: Insufficient documentation

## 2017-10-04 MED ORDER — AMOXICILLIN 250 MG/5ML PO SUSR: 500.0000 mg | Freq: Three times a day (TID) | ORAL | 0 refills | Status: DC

## 2017-10-04 MED ORDER — FLUCONAZOLE 150 MG PO TABS: 150.0000 mg | ORAL_TABLET | Freq: Once | ORAL | 0 refills | Status: AC

## 2017-10-04 MED ORDER — PREDNISONE 10 MG PO TABS: ORAL_TABLET | ORAL | 0 refills | Status: DC

## 2017-10-04 NOTE — Telephone Encounter (Signed)
Addressed this with pharmacist already

## 2017-10-04 NOTE — Telephone Encounter (Signed)
Please ask pharmacist if there is anything similar to sub for it (I am not sure)  Do other pharmacies possibly have some to get her by?   Let me know ,thanks

## 2017-10-04 NOTE — Telephone Encounter (Signed)
They have a can so pharmacy switched pt from the packets to the can, and just changed the directions to 1 scop TID with meals.

## 2017-10-04 NOTE — Telephone Encounter (Signed)
Copied from [place] [phone]. Topic: General - Other >> Oct 04, 2017  1:11 PM Lolita Rieger, [place] wrote: Reason for CRM: [name] from [facility] [address] called and stated that Brittney [name] 4 g packets are on back order and he would like to know if pt can use something else or what the doctor would like to do

## 2017-10-04 NOTE — Telephone Encounter (Signed)
Copied from [place] [id number]. Topic: General - Other >> Oct 04, 2017  1:11 PM Lolita Rieger, [place] wrote: Reason for CRM: [name] from [facility] [address] called and stated that Criselda Peaches 4 g packets are on back order and he would like to know if pt can use something else or what the doctor would like to do

## 2017-10-04 NOTE — Patient Instructions (Signed)
Get nasal saline over the counter to start flushing nostrils/sinuses several times daily Prednisone is going to help congestion and wheezing and cough  The amoxil is for infection  Keep taking cough medicine   Update if not starting to improve in a week or if worsening

## 2017-10-04 NOTE — Progress Notes (Signed)
Subjective:    Patient ID: Brittney [name], female    DOB: February 26, 1981, 37 y.o.   MRN: [id number]  HPI Here for f/u of uri from visit 2/11  A/p as follows:  Respiratory   Viral URI with cough - Primary     Supportive care Reassuring exam  hydocan for refractory cough when not working or driving  Tessalon tid and guifen dm prn  Update if not starting to improve in a week or if worsening   Given 100 mL of hycodan   Had prev been seen at [facility] for this   Temp: 98.1 F (36.7 C)  Pulse ox 975 on RA  Nothing has improved  Lot of pressure in R face now  Pressure behind eye- feels like it could drain but it has not  No fever  Very tired  Phlegm- yellow  No nosebleed   Cough is a little better - but still coughs at night (even after medicine and cannot go back to sleep) Lasts a few minutes  During the day she is better  Cough is deep at times as well  Wheezes only when coughing -albuterol is helpful   Patient Active Problem List   Diagnosis Date Noted  . Lipoma of back 10/04/2017  . Acute sinusitis 10/04/2017  . Viral URI with cough 09/25/2017  . Routine general medical examination at a health care facility 08/29/2017  . Fibrocystic breast changes 01/08/2016  . Hemorrhoids, external 09/30/2014  . Chronic diarrhea 08/29/2014  . 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 Hemorrhoids, internal, with bleeding 04/22/2014  . Family history of pulmonary embolism 09/02/2013  . Family history of diabetes mellitus 09/02/2013  . Cutaneous skin tags 05/15/2013  . DUB (dysfunctional uterine bleeding) 09/24/2012  . 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 EDEMA 11/04/2009  . Morbid obesity ([place]) 03/31/2008  . HEARING LOSS, MILD 03/31/2008  . MIGRAINE HEADACHE 02/20/2007  . IBS 02/20/2007  . OSTEOARTHRITIS 02/20/2007   Past Medical History:  Diagnosis Date  . Anemia   . Colon polyps   . Coronary artery abnormality    spasms  . 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 Fibroids     . Gallstones   . Irritable bowel syndrome   . Migraine   . Obesity, unspecified    Past Surgical History:  Procedure Laterality Date  . CHOLECYSTECTOMY  05/06/11  . COLONOSCOPY  11/2001   polyp; negative pathology  . COLONOSCOPY  09/2004   Negative  . COLONOSCOPY W/ POLYPECTOMY    . ESOPHAGOGASTRODUODENOSCOPY  1/10   normal  . FLEXIBLE SIGMOIDOSCOPY  03/05/2012   Procedure: FLEXIBLE SIGMOIDOSCOPY;  Surgeon: Inda Castle, MD;  Location: [facility];  Service: Endoscopy;  Laterality: N/A;  . LAPAROSCOPIC DECORTICATION / DUBULKING / ABLATION RENAL CYSTS     saline histogram  . TONSILLECTOMY  07/30/07  . Uterine US  09/2005   Large endometrial stripe; small ovarian cyst   Social History   Tobacco Use  . Smoking status: Never Smoker  . Smokeless tobacco: Never Used  Substance Use Topics  . Alcohol use: No    Alcohol/week: 0.0 oz  . Drug use: No   Family History  Problem Relation Age of Onset  . Heart disease Father   . Hypertension Father   . Heart attack Father   . Diabetes Father   . Ulcers Brother   . Clotting disorder Brother   . Ovarian cancer Maternal Grandmother   . Colon polyps Maternal Grandmother   .  Colon cancer Maternal Grandmother        40's  . Diabetes Paternal Grandmother   . Hypertension Paternal Grandmother   . Breast cancer Maternal Aunt        Age 43's  . Diabetes Paternal Aunt        x 5  . Lung cancer Maternal Grandfather        SMOKER   Allergies  Allergen Reactions  . Prevacid [Lansoprazole]     REACTION: rash   Current Outpatient Medications on File Prior to Visit  Medication Sig Dispense Refill  . cetirizine (ZYRTEC) 10 MG tablet Take 1 tablet (10 mg total) by mouth daily. 30 tablet 0  . cholestyramine (QUESTRAN) 4 g packet Take 1 packet (4 g total) by mouth 3 (three) times daily with meals. 90 each 11  . diphenhydrAMINE (BENADRYL) 25 mg capsule Take 2 capsules (50 mg total) by mouth every 6 (six) hours as needed. 60 capsule 0  .  fluticasone (FLONASE) 50 MCG/ACT nasal spray Place into the nose.    Marland Kitchen HYDROcodone-homatropine (HYCODAN) 5-1.5 MG/5ML syrup Take 5 mLs by mouth every 8 (eight) hours as needed for cough. When not working or driving, caution of sedation 100 mL 0  . hydrocortisone (ANUSOL-HC) 25 MG suppository Place 1 suppository (25 mg total) rectally at bedtime. 14 suppository 0  . ibuprofen (ADVIL,MOTRIN) 800 MG tablet Take by mouth.    . Iron-FA-B Cmp-C-Biot-Probiotic (FUSION PLUS) CAPS Take 1 capsule by mouth daily. 30 capsule 3  . metoCLOPramide (REGLAN) 10 MG tablet Take 1 tablet (10 mg total) by mouth every 6 (six) hours as needed. 30 tablet 0  . naproxen (NAPROSYN) 500 MG tablet Take 1 tablet (500 mg total) by mouth 2 (two) times daily with a meal. 20 tablet 0  . nitroGLYCERIN (NITROSTAT) 0.4 MG SL tablet Place 1 tablet (0.4 mg total) under the tongue every 5 (five) minutes as needed for chest pain. 25 tablet 6   Current Facility-Administered Medications on File Prior to Visit  Medication Dose Route Frequency Provider Last Rate Last Dose  . 0.9 %  sodium chloride infusion  500 mL Intravenous Continuous Nandigam, Venia Minks, MD        Review of Systems  Constitutional: Positive for appetite change. Negative for fatigue and fever.  HENT: Positive for congestion, ear pain, postnasal drip, rhinorrhea, sinus pressure and sore throat. Negative for nosebleeds.   Eyes: Negative for pain, redness and itching.  Respiratory: Positive for cough. Negative for shortness of breath and wheezing.   Cardiovascular: Negative for chest pain.  Gastrointestinal: Negative for abdominal pain, diarrhea, nausea and vomiting.  Endocrine: Negative for polyuria.  Genitourinary: Negative for dysuria, frequency and urgency.  Musculoskeletal: Negative for arthralgias and myalgias.  Allergic/Immunologic: Negative for immunocompromised state.  Neurological: Positive for headaches. Negative for dizziness, tremors, syncope, weakness and  numbness.  Hematological: Negative for adenopathy. Does not bruise/bleed easily.  Psychiatric/Behavioral: Negative for dysphoric mood. The patient is not nervous/anxious.        Objective:   Physical Exam  Constitutional: She appears well-developed and well-nourished. No distress.  HENT:  Head: Normocephalic and atraumatic.  Right Ear: External ear normal.  Left Ear: External ear normal.  Mouth/Throat: Oropharynx is clear and moist. No oropharyngeal exudate.  Nares are injected and congested -worse on R Bilateral maxillary sinus tenderness -worse on R  Post nasal drip   Eyes: Conjunctivae and EOM are normal. Pupils are equal, round, and reactive to light. Right eye exhibits no  discharge. Left eye exhibits no discharge. No scleral icterus.  Neck: Normal range of motion. Neck supple.  Cardiovascular: Normal rate and regular rhythm.  Pulmonary/Chest: Effort normal and breath sounds normal. No respiratory distress. She has no wheezes. She has no rales.  Lymphadenopathy:    She has no cervical adenopathy.  Neurological: She is alert. No cranial nerve deficit.  Skin: Skin is warm and dry. No rash noted.  Rubbery oval mass R side of TS -non tender and slt mobile Strongly suspect lipoma   Psychiatric: She has a normal mood and affect.          Assessment & Plan:   Problem List Items Addressed This Visit      Respiratory   Acute sinusitis - Primary    Worse R maxillary  S/p long uri  supp care augmentin pred taper  Fluids and nasal saline Update if not starting to improve in a week or if worsening        Relevant Medications   amoxicillin (AMOXIL) 250 MG/5ML suspension   predniSONE (DELTASONE) 10 MG tablet   Viral URI with cough    Now turning into R maxillary sinus infection See a/p for that         Other   Lipoma of back    Oval rubbery mass over L thoracic back next to spine  nontender  Strongly suspect lipoma  Not bothersome-will obs for change or growth

## 2017-10-05 NOTE — Telephone Encounter (Signed)
I have already addressed this in a separate CRM, I spoke with the pharmacy and they have a Can of the medication they changed it to with the directions of just taking one scop TID with a meal, I have given the verbal order to change it yesterday. Called pharmacy and verified that it was changed and that [name] Chartered loss adjuster) called in error while the pharmacist had already taken care of this

## 2017-10-05 NOTE — Assessment & Plan Note (Signed)
Oval rubbery mass over L thoracic back next to spine  nontender  Strongly suspect lipoma  Not bothersome-will obs for change or growth

## 2017-10-05 NOTE — Assessment & Plan Note (Signed)
Worse R maxillary  S/p long uri  supp care augmentin pred taper  Fluids and nasal saline Update if not starting to improve in a week or if worsening

## 2017-10-05 NOTE — Assessment & Plan Note (Signed)
Now turning into R maxillary sinus infection See a/p for that

## 2017-10-05 NOTE — Telephone Encounter (Signed)
Thanks

## 2017-10-15 ENCOUNTER — Encounter: Payer: Self-pay | Admitting: Family Medicine

## 2017-10-15 DIAGNOSIS — J209 Acute bronchitis, unspecified: Secondary | ICD-10-CM

## 2017-10-16 DIAGNOSIS — J209 Acute bronchitis, unspecified: Secondary | ICD-10-CM | POA: Insufficient documentation

## 2017-10-16 NOTE — Telephone Encounter (Signed)
Referral to pulmonary for ongoing symptoms of bronchitis Will route to [facility]

## 2017-10-18 ENCOUNTER — Encounter: Payer: Self-pay | Admitting: Internal Medicine

## 2017-10-18 ENCOUNTER — Ambulatory Visit: Payer: BC Managed Care – PPO | Admitting: Internal Medicine

## 2017-10-18 VITALS — BP 100/80 | HR 80 | Ht 66.5 in | Wt 288.0 lb

## 2017-10-18 DIAGNOSIS — R059 Cough, unspecified: Secondary | ICD-10-CM

## 2017-10-18 DIAGNOSIS — J4541 Moderate persistent asthma with (acute) exacerbation: Secondary | ICD-10-CM

## 2017-10-18 DIAGNOSIS — R05 Cough: Secondary | ICD-10-CM

## 2017-10-18 MED ORDER — BENZONATATE 100 MG PO CAPS: 200.0000 mg | ORAL_CAPSULE | Freq: Three times a day (TID) | ORAL | 2 refills | Status: DC

## 2017-10-18 MED ORDER — FLUTICASONE PROPIONATE (INHAL) 250 MCG/BLIST IN AEPB: 1.0000 | INHALATION_SPRAY | Freq: Two times a day (BID) | RESPIRATORY_TRACT | 1 refills | Status: DC

## 2017-10-18 MED ORDER — HYDROCODONE-ACETAMINOPHEN 7.5-325 MG/15ML PO SOLN: 15.0000 mL | Freq: Every day | ORAL | 0 refills | Status: DC

## 2017-10-18 NOTE — Patient Instructions (Addendum)
You have a 'post-viral' cough.   Start omeprazole 20 mg once daily.  Call back in 4-6 weeks if symptoms not improved.

## 2017-10-18 NOTE — Progress Notes (Signed)
Brittney [name] Pulmonary Medicine Consultation      Assessment and Plan:  Persistent cough, likely post viral airway hyperreactivity/asthmatic bronchitis. -Continue symptomatic treatment of the cough as needed. -Patient was prescribed Tessalon at a higher dose of 200 mg 3 times daily. - She was prescribed a higher dose of hydrocodone cough syrup, 7.5/320 5 at night to help her sleep. -She is prescribed fluticasone inhaler 200 mcg twice daily.  GERD. -Discussed that GERD may contribute to cough, and may prevent her irritated bronchi from healing. - We will start omeprazole 20 mg once daily.   Date: 10/18/2017  MRN# [id number] Brittney [name] 1981-06-07   Brittney [name] is a 37 y.o. old female seen in consultation for chief complaint of:    Chief Complaint  Patient presents with  . Consult    ref by Dr. Glori Bickers for acute bronchitis: NP cough, SOB w/activity and rest: wheezing    HPI:  The patient is a 37 year old female referred from her primary care doctor at [facility] clinic, due to symptoms of persistent cough of 2-3 weeks associated with nasal congestion, wheezing and hoarseness.  Patient received a course of antibiotics (Levaquin), as well as codeine cough syrup prednisone, patient had a flu test which was apparently negative.  Subsequently had been followed up at urgent care and again at primary care physician's office and found to have right maxillary sinusitis treated with another course of prednisone and antibiotics. Her symptoms have now been present for about a month. The symptoms started with a cold. She denies nasal drainage. No pets at home. She has reflux symptoms mostly when she is laying down, she does not take anything for it. Currently her main symptoms is cough, made worse with laughing, talking a lot, laying down.  Does wake her from sleep.  She has tried mucinex, codeine cough medicine did not help, albuterol, tessalon which did not help.   Imaging personally  reviewed chest x-ray 02/29/16; normal chest x-ray CBC 08/29/17; Abd eosinophil count 100    PMHX:   Past Medical History:  Diagnosis Date  . Anemia   . Colon polyps   . Coronary artery abnormality    spasms  . 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 Fibroids   . Gallstones   . Irritable bowel syndrome   . Migraine   . Obesity, unspecified    Surgical Hx:  Past Surgical History:  Procedure Laterality Date  . CHOLECYSTECTOMY  05/06/11  . COLONOSCOPY  11/2001   polyp; negative pathology  . COLONOSCOPY  09/2004   Negative  . COLONOSCOPY W/ POLYPECTOMY    . ESOPHAGOGASTRODUODENOSCOPY  1/10   normal  . FLEXIBLE SIGMOIDOSCOPY  03/05/2012   Procedure: FLEXIBLE SIGMOIDOSCOPY;  Surgeon: Inda Castle, MD;  Location: [facility];  Service: Endoscopy;  Laterality: N/A;  . LAPAROSCOPIC DECORTICATION / DUBULKING / ABLATION RENAL CYSTS     saline histogram  . TONSILLECTOMY  07/30/07  . Uterine US  09/2005   Large endometrial stripe; small ovarian cyst   Family Hx:  Family History  Problem Relation Age of Onset  . Heart disease Father   . Hypertension Father   . Heart attack Father   . Diabetes Father   . Ulcers Brother   . Clotting disorder Brother   . Ovarian cancer Maternal Grandmother   . Colon polyps Maternal Grandmother   . Colon cancer Maternal Grandmother        40's  . Diabetes Paternal Grandmother   . Hypertension Paternal  Grandmother   . Breast cancer Maternal Aunt        Age 41's  . Diabetes Paternal Aunt        x 5  . Lung cancer Maternal Grandfather        SMOKER   Social Hx:   Social History   Tobacco Use  . Smoking status: Never Smoker  . Smokeless tobacco: Never Used  Substance Use Topics  . Alcohol use: No    Alcohol/week: 0.0 oz  . Drug use: No   Medication:    Current Outpatient Medications:  .  fluticasone (FLONASE) 50 MCG/ACT nasal spray, Place into the nose., Disp: , Rfl:  .  HYDROcodone-homatropine (HYCODAN) 5-1.5 MG/5ML syrup, Take 5 mLs by  mouth every 8 (eight) hours as needed for cough. When not working or driving, caution of sedation, Disp: 100 mL, Rfl: 0 .  hydrocortisone (ANUSOL-HC) 25 MG suppository, Place 1 suppository (25 mg total) rectally at bedtime., Disp: 14 suppository, Rfl: 0 .  ibuprofen (ADVIL,MOTRIN) 800 MG tablet, Take by mouth., Disp: , Rfl:  .  Iron-FA-B Cmp-C-Biot-Probiotic (FUSION PLUS) CAPS, Take 1 capsule by mouth daily., Disp: 30 capsule, Rfl: 3 .  metoCLOPramide (REGLAN) 10 MG tablet, Take 1 tablet (10 mg total) by mouth every 6 (six) hours as needed., Disp: 30 tablet, Rfl: 0 .  naproxen (NAPROSYN) 500 MG tablet, Take 1 tablet (500 mg total) by mouth 2 (two) times daily with a meal., Disp: 20 tablet, Rfl: 0 .  nitroGLYCERIN (NITROSTAT) 0.4 MG SL tablet, Place 1 tablet (0.4 mg total) under the tongue every 5 (five) minutes as needed for chest pain., Disp: 25 tablet, Rfl: 6  Current Facility-Administered Medications:  .  0.9 %  sodium chloride infusion, 500 mL, Intravenous, Continuous, Nandigam, Kavitha V, MD   Allergies:  Prevacid [lansoprazole]  Review of Systems: Gen:  Denies  fever, sweats, chills HEENT: Denies blurred vision, double vision. bleeds, sore throat Cvc:  No dizziness, chest pain. Resp:   Denies cough or sputum production, shortness of breath Gi: Denies swallowing difficulty, stomach pain. Gu:  Denies bladder incontinence, burning urine Ext:   No Joint pain, stiffness. Skin: No skin rash,  hives  Endoc:  No polyuria, polydipsia. Psych: No depression, insomnia. Other:  All other systems were reviewed with the patient and were negative other that what is mentioned in the HPI.   Physical Examination:   VS: BP 100/80 (BP Location: Left Arm, Cuff Size: Large)   Pulse 80   Ht 5' 6.5" (1.689 m)   Wt 288 lb (130.6 kg)   LMP 09/21/2017   SpO2 100%   BMI 45.79 kg/m   General Appearance: No distress  Neuro:without focal findings,  speech normal,  HEENT: PERRLA, EOM intact.     Pulmonary: normal breath sounds, No wheezing.  CardiovascularNormal S1,S2.  No m/r/g.   Abdomen: Benign, Soft, non-tender. Renal:  No costovertebral tenderness  GU:  No performed at this time. Endoc: No evident thyromegaly, no signs of acromegaly. Skin:   warm, no rashes, no ecchymosis  Extremities: normal, no cyanosis, clubbing.  Other findings:    LABORATORY PANEL:   CBC No results for input(s): WBC, HGB, HCT, PLT in the last 168 hours. ------------------------------------------------------------------------------------------------------------------  Chemistries  No results for input(s): NA, K, CL, CO2, GLUCOSE, BUN, CREATININE, CALCIUM, MG, AST, ALT, ALKPHOS, BILITOT in the last 168 hours.  Invalid input(s): GFRCGP ------------------------------------------------------------------------------------------------------------------  Cardiac Enzymes No results for input(s): TROPONINI in the last 168 hours. ------------------------------------------------------------  RADIOLOGY:  No results found.     Thank  you for the consultation and for allowing [facility] to assist in the care of your patient. Our recommendations are noted above.  Please contact us if we can be of further service.   Marda Stalker, MD.  Board Certified in Internal Medicine, Pulmonary Medicine, [place], and Sleep Medicine.  [facility] Office Number: [phone]  Patricia Pesa, M.D.  Merton Border, M.D  10/18/2017

## 2017-12-19 ENCOUNTER — Other Ambulatory Visit: Payer: Self-pay | Admitting: Obstetrics and Gynecology

## 2017-12-20 NOTE — Patient Instructions (Signed)
Brittney [name]  12/20/2017   Your procedure is scheduled on: 01-03-18   Report to [facility] Main  Entrance    Report to admitting at 9:00AM    Call this number if you have problems the morning of surgery [phone]     Remember: Do not eat food or drink liquids :After Midnight.     Take these medicines the morning of surgery with A SIP OF WATER: fluticasone(flonase) if needed                                You may not have any metal on your body including hair pins and              piercings  Do not wear jewelry, make-up, lotions, powders or perfumes, deodorant             Do not wear nail polish.  Do not shave  48 hours prior to surgery.              Do not bring valuables to the hospital. [place].  Contacts, dentures or bridgework may not be worn into surgery.  Leave suitcase in the car. After surgery it may be brought to your room.                 Please read over the following fact sheets you were given: _____________________________________________________________________             [facility] - Preparing for Surgery Before surgery, you can play an important role.  Because skin is not sterile, your skin needs to be as free of germs as possible.  You can reduce the number of germs on your skin by washing with CHG (chlorahexidine gluconate) soap before surgery.  CHG is an antiseptic cleaner which kills germs and bonds with the skin to continue killing germs even after washing. Please DO NOT use if you have an allergy to CHG or antibacterial soaps.  If your skin becomes reddened/irritated stop using the CHG and inform your nurse when you arrive at Short Stay. Do not shave (including legs and underarms) for at least 48 hours prior to the first CHG shower.  You may shave your face/neck. Please follow these instructions carefully:  1.  Shower with CHG Soap the night before surgery and the   morning of Surgery.  2.  If you choose to wash your hair, wash your hair first as usual with your  normal  shampoo.  3.  After you shampoo, rinse your hair and body thoroughly to remove the  shampoo.                           4.  Use CHG as you would any other liquid soap.  You can apply chg directly  to the skin and wash                       Gently with a scrungie or clean washcloth.  5.  Apply the CHG Soap to your body ONLY FROM THE NECK DOWN.   Do not use on face/ open  Wound or open sores. Avoid contact with eyes, ears mouth and genitals (private parts).                       Wash face,  Genitals (private parts) with your normal soap.             6.  Wash thoroughly, paying special attention to the area where your surgery  will be performed.  7.  Thoroughly rinse your body with warm water from the neck down.  8.  DO NOT shower/wash with your normal soap after using and rinsing off  the CHG Soap.                9.  Pat yourself dry with a clean towel.            10.  Wear clean pajamas.            11.  Place clean sheets on your bed the night of your first shower and do not  sleep with pets. Day of Surgery : Do not apply any lotions/deodorants the morning of surgery.  Please wear clean clothes to the hospital/surgery center.  FAILURE TO FOLLOW THESE INSTRUCTIONS MAY RESULT IN THE CANCELLATION OF YOUR SURGERY PATIENT SIGNATURE_________________________________  NURSE SIGNATURE__________________________________  ________________________________________________________________________   Adam Phenix  An incentive spirometer is a tool that can help keep your lungs clear and active. This tool measures how well you are filling your lungs with each breath. Taking long deep breaths may help reverse or decrease the chance of developing breathing (pulmonary) problems (especially infection) following:  A long period of time when you are unable to move or be  active. BEFORE THE PROCEDURE   If the spirometer includes an indicator to show your best effort, your nurse or respiratory therapist will set it to a desired goal.  If possible, sit up straight or lean slightly forward. Try not to slouch.  Hold the incentive spirometer in an upright position. INSTRUCTIONS FOR USE  1. Sit on the edge of your bed if possible, or sit up as far as you can in bed or on a chair. 2. Hold the incentive spirometer in an upright position. 3. Breathe out normally. 4. Place the mouthpiece in your mouth and seal your lips tightly around it. 5. Breathe in slowly and as deeply as possible, raising the piston or the ball toward the top of the column. 6. Hold your breath for 3-5 seconds or for as long as possible. Allow the piston or ball to fall to the bottom of the column. 7. Remove the mouthpiece from your mouth and breathe out normally. 8. Rest for a few seconds and repeat Steps 1 through 7 at least 10 times every 1-2 hours when you are awake. Take your time and take a few normal breaths between deep breaths. 9. The spirometer may include an indicator to show your best effort. Use the indicator as a goal to work toward during each repetition. 10. After each set of 10 deep breaths, practice coughing to be sure your lungs are clear. If you have an incision (the cut made at the time of surgery), support your incision when coughing by placing a pillow or rolled up towels firmly against it. Once you are able to get out of bed, walk around indoors and cough well. You may stop using the incentive spirometer when instructed by your caregiver.  RISKS AND COMPLICATIONS  Take your time so you do not get  dizzy or light-headed.  If you are in pain, you may need to take or ask for pain medication before doing incentive spirometry. It is harder to take a deep breath if you are having pain. AFTER USE  Rest and breathe slowly and easily.  It can be helpful to keep track of a log of  your progress. Your caregiver can provide you with a simple table to help with this. If you are using the spirometer at home, follow these instructions: [place] IF:   You are having difficultly using the spirometer.  You have trouble using the spirometer as often as instructed.  Your pain medication is not giving enough relief while using the spirometer.  You develop fever of 100.5 F (38.1 C) or higher. SEEK IMMEDIATE MEDICAL CARE IF:   You cough up bloody sputum that had not been present before.  You develop fever of 102 F (38.9 C) or greater.  You develop worsening pain at or near the incision site. MAKE SURE YOU:   Understand these instructions.  Will watch your condition.  Will get help right away if you are not doing well or get worse. Document Released: 12/12/2006 Document Revised: 10/24/2011 Document Reviewed: 02/12/2007 [facility] Patient Information 2014 [place], [place].   ________________________________________________________________________

## 2017-12-22 ENCOUNTER — Encounter (HOSPITAL_COMMUNITY): Payer: Self-pay

## 2017-12-22 ENCOUNTER — Encounter (HOSPITAL_COMMUNITY)
Admission: RE | Admit: 2017-12-22 | Discharge: 2017-12-22 | Disposition: A | Discharge status: Home / Self Care | Payer: BC Managed Care – PPO | Source: Ambulatory Visit | Attending: Obstetrics and Gynecology | Admitting: Obstetrics and Gynecology

## 2017-12-22 ENCOUNTER — Other Ambulatory Visit: Payer: Self-pay

## 2017-12-22 DIAGNOSIS — D259 Leiomyoma of uterus, unspecified: Secondary | ICD-10-CM | POA: Insufficient documentation

## 2017-12-22 DIAGNOSIS — Z01812 Encounter for preprocedural laboratory examination: Secondary | ICD-10-CM | POA: Insufficient documentation

## 2017-12-22 LAB — CBC
HEMATOCRIT: 37 % (ref 36.0–46.0)
HEMOGLOBIN: 11.6 g/dL — AB (ref 12.0–15.0)
MCH: 24.6 pg — ABNORMAL LOW (ref 26.0–34.0)
MCHC: 31.4 g/dL (ref 30.0–36.0)
MCV: 78.4 fL (ref 78.0–100.0)
PLATELETS: 319 10*3/uL (ref 150–400)
RBC: 4.72 MIL/uL (ref 3.87–5.11)
RDW: 16.7 % — ABNORMAL HIGH (ref 11.5–15.5)
WBC: 6.5 10*3/uL (ref 4.0–10.5)

## 2017-12-22 LAB — PREGNANCY, URINE: Preg Test, Ur: NEGATIVE

## 2018-01-02 ENCOUNTER — Other Ambulatory Visit: Payer: Self-pay | Admitting: Obstetrics and Gynecology

## 2018-01-02 MED ORDER — DEXTROSE 5 % IV SOLN
3.0000 g | INTRAVENOUS | Status: AC
  Administered 2018-01-03: 3 g via INTRAVENOUS
  Filled 2018-01-02: qty 3

## 2018-01-02 NOTE — H&P (Signed)
37 y.o.  complains of symptomatic fibroid uterus.  Previously"Previously:"D/w pt all options first- [place] and lupron and pt still wishes to proceed with Robo TLH with salpingectomies. Will still do preop on May 10th. Incision above umbilicus. Will do My Risk testing for fhx of ovarian ca and will counsel pt about BSO after that. \History of Present Illness \\Pt . here for consult for hysterectomy for fibroids, MA pt.; Pt. c/o intermittent pelvic pain that is worse with cycles, also c/o irregular cycles; TLH/salp. scheduled on 01/03/18/tb//\cb3 Pt had IUD and OCPs and still had heavy periods. Severe cramping and blood clots with periods. Occ bleeding in between or with sa but nothing weird. Pt does not want future fertility. \Maternal GM had ovarian ca early and died at 39 yo. 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 \cb3 No leaking urine. No splinting. "\My Risk neg for mutations. Pt is negative for any gene mutations and her lifetime risk of breast cancer is 10%. Her 5 year risk is <1%. Pt has already established with GI and has had her first colo with polyps removed. Pt voiced understanding, all questions answered and pt given her packet. I spent at least 15 min in face to face consultation with patient.\Will leave ovaries. US Pelvic 9x7x6; EM 91mm; two main fibroids; normal ovaries."  Past Medical History:  Diagnosis Date  . Anemia   . Colon polyps   . Coronary artery abnormality    spasms   . Coronary artery spasm [facility])    reports saw cardiologist for chest pains , was told she was having coronary artery spasms , sent for cardio w/u with stress and echo  both unremarkable. reports today has not had any spasms or chest pains in over 2 years    . Fibroids   . Gallstones   . Irritable bowel syndrome   . Migraine    hasnt had one in a long time  . Obesity, unspecified    Past Surgical History:  Procedure Laterality Date  . CHOLECYSTECTOMY  05/06/11  . COLONOSCOPY   11/2001   polyp; negative pathology  . COLONOSCOPY  09/2004   Negative  . COLONOSCOPY  2017 or 2018 patient  unsure   [facility] ;   . COLONOSCOPY W/ POLYPECTOMY    . ESOPHAGOGASTRODUODENOSCOPY  1/10   normal  . FLEXIBLE SIGMOIDOSCOPY  03/05/2012   Procedure: FLEXIBLE SIGMOIDOSCOPY;  Surgeon: Inda Castle, MD;  Location: [facility];  Service: Endoscopy;  Laterality: N/A;  . LAPAROSCOPIC DECORTICATION / DUBULKING / ABLATION RENAL CYSTS     saline histogram  . TONSILLECTOMY  07/30/07  . Uterine US  09/2005   Large endometrial stripe; small ovarian cyst    Social History   Socioeconomic History  . Marital status: Married    Spouse name: Not on file  . Number of children: 2  . Years of education: Not on file  . Highest education level: Not on file  Occupational History  . Occupation: Pharmacist, hospital  Social Needs  . Financial resource strain: Not on file  . Food insecurity:    Worry: Not on file    Inability: Not on file  . Transportation needs:    Medical: Not on file    Non-medical: Not on file  Tobacco Use  . Smoking status: Never Smoker  . Smokeless tobacco: Never Used  Substance and Sexual Activity  . Alcohol use: No    Alcohol/week: 0.0 oz  . Drug use: No  .  Sexual activity: Yes    Birth control/protection: Condom  Lifestyle  . Physical activity:    Days per week: Not on file    Minutes per session: Not on file  . Stress: Not on file  Relationships  . Social connections:    Talks on phone: Not on file    Gets together: Not on file    Attends religious service: Not on file    Active member of club or organization: Not on file    Attends meetings of clubs or organizations: Not on file    Relationship status: Not on file  . Intimate partner violence:    Fear of current or ex partner: Not on file    Emotionally abused: Not on file    Physically abused: Not on file    Forced sexual activity: Not on file  Other Topics Concern  . 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 Use-soda          Current Facility-Administered Medications on File Prior to Encounter  Medication Dose Route Frequency Provider Last Rate Last Dose  . 0.9 %  sodium chloride infusion  500 mL Intravenous Continuous Nandigam, Venia Minks, MD       Current Outpatient Medications on File Prior to Encounter  Medication Sig Dispense Refill  . EPINEPHrine 0.3 mg/0.3 mL IJ SOAJ injection Inject 0.3 mg into the muscle daily as needed for anaphylaxis.  1  . fluticasone (FLONASE) 50 MCG/ACT nasal spray Place 1-2 sprays into the nose daily as needed (for allergies.).     Marland Kitchen ibuprofen (ADVIL,MOTRIN) 800 MG tablet Take 800 mg by mouth 3 (three) times daily as needed (for pain.).     Marland Kitchen levocetirizine (XYZAL) 5 MG tablet Take 10 mg by mouth at bedtime.  5  . montelukast (SINGULAIR) 10 MG tablet Take 10 mg by mouth at bedtime.  5  . nitroGLYCERIN (NITROSTAT) 0.4 MG SL tablet Place 1 tablet (0.4 mg total) under the tongue every 5 (five) minutes as needed for chest pain. 25 tablet 6  . NON FORMULARY 1 Syringe by Other route every 7 (seven) days. ALLERGY SHOTS    . triamcinolone cream (KENALOG) 0.1 % Apply 1 application topically 2 (two) times daily as needed (for Eczema).    . benzonatate (TESSALON PERLES) 100 MG capsule Take 2 capsules (200 mg total) by mouth 3 (three) times daily. (Patient not taking: Reported on 12/18/2017) 90 capsule 2  . Fluticasone Propionate, Inhal, 250 MCG/BLIST AEPB Inhale 1 puff into the lungs 2 (two) times daily. Rinse mouth after use. (Patient not taking: Reported on 12/18/2017) 60 each 1  . HYDROcodone-acetaminophen (HYCET) 7.5-325 mg/15 ml solution Take 15 mLs by mouth at bedtime. (Patient not taking: Reported on 12/18/2017) 120 mL 0  . HYDROcodone-homatropine (HYCODAN) 5-1.5 MG/5ML syrup Take 5 mLs by mouth every 8 (eight) hours as needed for cough. When not working or driving, caution of sedation (Patient not  taking: Reported on 12/18/2017) 100 mL 0  . metoCLOPramide (REGLAN) 10 MG tablet Take 1 tablet (10 mg total) by mouth every 6 (six) hours as needed. (Patient not taking: Reported on 12/18/2017) 30 tablet 0  . naproxen (NAPROSYN) 500 MG tablet Take 1 tablet (500 mg total) by mouth 2 (two) times daily with a meal. (Patient not taking: Reported on 12/18/2017) 20 tablet 0    Allergies  Allergen  Reactions  . Prevacid [Lansoprazole] Rash    There were no vitals filed for this visit.  Lungs: clear to ascultation Cor:  RRR Abdomen:  soft, nontender, nondistended. Ex:  no cords, erythema Pelvic:   .    Marland Kitchen Vulva: no masses, no atrophy, no lesions\ls1   . Bladder/Urethra: normal meatus, no urethral discharge, no urethral mass, bladder non distended\ls1   . Vagina no tenderness, no erythema, no abnormal vaginal discharge, no vesicle(s) or ulcers, no cystocele, no rectocele\ls1   . Cervix: grossly normal, no discharge, no cervical motion tenderness\ls1   . Uterus: normal size (10), normal shape, midline, mobile, non-tender, no uterine prolapse\ls1   . Adnexa/Parametria: no parametrial tenderness, no parametrial mass, no adnexal tenderness, no ovarian mass  A:  For robotic TLH/Salpingectomies, cystoscopy, BSO only if necessary.   P: All risks, benefits and alternatives d/w patient and she desires to proceed.  Patient has undergone a modified diet and will receive preop antibiotics and SCDs during the operation.     Tiegan Jambor A

## 2018-01-03 ENCOUNTER — Ambulatory Visit (HOSPITAL_COMMUNITY): Payer: BC Managed Care – PPO | Admitting: Certified Registered Nurse Anesthetist

## 2018-01-03 ENCOUNTER — Encounter (HOSPITAL_COMMUNITY): Payer: Self-pay | Admitting: Certified Registered Nurse Anesthetist

## 2018-01-03 ENCOUNTER — Ambulatory Visit (HOSPITAL_COMMUNITY)
Admission: RE | Admit: 2018-01-03 | Discharge: 2018-01-04 | Disposition: A | Discharge status: Home / Self Care | Payer: BC Managed Care – PPO | Source: Ambulatory Visit | Attending: Obstetrics and Gynecology | Admitting: Obstetrics and Gynecology

## 2018-01-03 ENCOUNTER — Encounter (HOSPITAL_COMMUNITY)
Admission: RE | Disposition: A | Discharge status: Home / Self Care | Payer: Self-pay | Source: Ambulatory Visit | Attending: Obstetrics and Gynecology

## 2018-01-03 DIAGNOSIS — Z79899 Other long term (current) drug therapy: Secondary | ICD-10-CM | POA: Diagnosis not present

## 2018-01-03 DIAGNOSIS — F410 Panic disorder [episodic paroxysmal anxiety] without agoraphobia: Secondary | ICD-10-CM | POA: Insufficient documentation

## 2018-01-03 DIAGNOSIS — Z9049 Acquired absence of other specified parts of digestive tract: Secondary | ICD-10-CM | POA: Insufficient documentation

## 2018-01-03 DIAGNOSIS — N946 Dysmenorrhea, unspecified: Secondary | ICD-10-CM | POA: Diagnosis present

## 2018-01-03 DIAGNOSIS — K219 Gastro-esophageal reflux disease without esophagitis: Secondary | ICD-10-CM | POA: Insufficient documentation

## 2018-01-03 DIAGNOSIS — M1990 Unspecified osteoarthritis, unspecified site: Secondary | ICD-10-CM | POA: Insufficient documentation

## 2018-01-03 DIAGNOSIS — N838 Other noninflammatory disorders of ovary, fallopian tube and broad ligament: Secondary | ICD-10-CM | POA: Insufficient documentation

## 2018-01-03 DIAGNOSIS — Z6841 Body Mass Index (BMI) 40.0 and over, adult: Secondary | ICD-10-CM | POA: Diagnosis not present

## 2018-01-03 DIAGNOSIS — D649 Anemia, unspecified: Secondary | ICD-10-CM | POA: Diagnosis not present

## 2018-01-03 DIAGNOSIS — Z888 Allergy status to other drugs, medicaments and biological substances status: Secondary | ICD-10-CM | POA: Diagnosis not present

## 2018-01-03 DIAGNOSIS — K589 Irritable bowel syndrome without diarrhea: Secondary | ICD-10-CM | POA: Insufficient documentation

## 2018-01-03 DIAGNOSIS — Z86010 Personal history of colonic polyps: Secondary | ICD-10-CM | POA: Diagnosis not present

## 2018-01-03 DIAGNOSIS — I2510 Atherosclerotic heart disease of native coronary artery without angina pectoris: Secondary | ICD-10-CM | POA: Diagnosis not present

## 2018-01-03 DIAGNOSIS — D259 Leiomyoma of uterus, unspecified: Secondary | ICD-10-CM | POA: Diagnosis not present

## 2018-01-03 DIAGNOSIS — Z9889 Other specified postprocedural states: Secondary | ICD-10-CM

## 2018-01-03 HISTORY — PX: CYSTOSCOPY: SHX5120

## 2018-01-03 HISTORY — PX: ROBOTIC ASSISTED LAPAROSCOPIC HYSTERECTOMY AND SALPINGECTOMY: SHX6379

## 2018-01-03 LAB — TYPE AND SCREEN
ABO/RH(D): O POS
Antibody Screen: NEGATIVE

## 2018-01-03 SURGERY — XI ROBOTIC ASSISTED LAPAROSCOPIC HYSTERECTOMY AND SALPINGECTOMY
Anesthesia: General

## 2018-01-03 MED ORDER — KETOROLAC TROMETHAMINE 30 MG/ML IJ SOLN
INTRAMUSCULAR | Status: AC
  Filled 2018-01-03: qty 1

## 2018-01-03 MED ORDER — PROMETHAZINE HCL 25 MG/ML IJ SOLN: 6.2500 mg | INTRAMUSCULAR | Status: DC | PRN

## 2018-01-03 MED ORDER — LIDOCAINE HCL (CARDIAC) PF 100 MG/5ML IV SOSY
PREFILLED_SYRINGE | INTRAVENOUS | Status: DC | PRN
  Administered 2018-01-03: 100 mg via INTRAVENOUS

## 2018-01-03 MED ORDER — METOCLOPRAMIDE HCL 10 MG PO TABS: 10.0000 mg | ORAL_TABLET | Freq: Four times a day (QID) | ORAL | Status: DC | PRN

## 2018-01-03 MED ORDER — ONDANSETRON HCL 4 MG/2ML IJ SOLN
INTRAMUSCULAR | Status: DC | PRN
  Administered 2018-01-03: 4 mg via INTRAVENOUS

## 2018-01-03 MED ORDER — SODIUM CHLORIDE 0.9 % IJ SOLN
INTRAMUSCULAR | Status: DC | PRN
  Administered 2018-01-03: 30 mL

## 2018-01-03 MED ORDER — FENTANYL CITRATE (PF) 100 MCG/2ML IJ SOLN
INTRAMUSCULAR | Status: AC
  Filled 2018-01-03: qty 2

## 2018-01-03 MED ORDER — ONDANSETRON HCL 4 MG PO TABS: 4.0000 mg | ORAL_TABLET | Freq: Four times a day (QID) | ORAL | Status: DC | PRN

## 2018-01-03 MED ORDER — ROCURONIUM BROMIDE 100 MG/10ML IV SOLN
INTRAVENOUS | Status: DC | PRN
  Administered 2018-01-03: 5 mg via INTRAVENOUS
  Administered 2018-01-03: 60 mg via INTRAVENOUS
  Administered 2018-01-03: 10 mg via INTRAVENOUS

## 2018-01-03 MED ORDER — ONDANSETRON HCL 4 MG/2ML IJ SOLN
INTRAMUSCULAR | Status: AC
  Filled 2018-01-03: qty 2

## 2018-01-03 MED ORDER — SUGAMMADEX SODIUM 200 MG/2ML IV SOLN
INTRAVENOUS | Status: DC | PRN
  Administered 2018-01-03: 200 mg via INTRAVENOUS

## 2018-01-03 MED ORDER — KETOROLAC TROMETHAMINE 30 MG/ML IJ SOLN
INTRAMUSCULAR | Status: DC | PRN
  Administered 2018-01-03: 30 mg via INTRAVENOUS

## 2018-01-03 MED ORDER — METOCLOPRAMIDE HCL 5 MG/ML IJ SOLN
INTRAMUSCULAR | Status: AC
  Filled 2018-01-03: qty 2

## 2018-01-03 MED ORDER — LACTATED RINGERS IR SOLN
Status: DC | PRN
  Administered 2018-01-03: 1000 mL

## 2018-01-03 MED ORDER — KETOROLAC TROMETHAMINE 30 MG/ML IJ SOLN
30.0000 mg | Freq: Four times a day (QID) | INTRAMUSCULAR | Status: AC
  Administered 2018-01-03 – 2018-01-04 (×3): 30 mg via INTRAVENOUS

## 2018-01-03 MED ORDER — HYDROMORPHONE HCL 1 MG/ML IJ SOLN
INTRAMUSCULAR | Status: AC
  Filled 2018-01-03: qty 1

## 2018-01-03 MED ORDER — IBUPROFEN 200 MG PO TABS: 800.0000 mg | ORAL_TABLET | Freq: Three times a day (TID) | ORAL | Status: DC | PRN

## 2018-01-03 MED ORDER — CEFAZOLIN SODIUM-DEXTROSE 2-4 GM/100ML-% IV SOLN
INTRAVENOUS | Status: AC
  Filled 2018-01-03: qty 100

## 2018-01-03 MED ORDER — DEXAMETHASONE SODIUM PHOSPHATE 10 MG/ML IJ SOLN
INTRAMUSCULAR | Status: AC
  Filled 2018-01-03: qty 1

## 2018-01-03 MED ORDER — LEVOCETIRIZINE DIHYDROCHLORIDE 5 MG PO TABS: 10.0000 mg | ORAL_TABLET | Freq: Every day | ORAL | Status: DC

## 2018-01-03 MED ORDER — MENTHOL 3 MG MT LOZG: 1.0000 | LOZENGE | OROMUCOSAL | Status: DC | PRN

## 2018-01-03 MED ORDER — MEPERIDINE HCL 50 MG/ML IJ SOLN: 6.2500 mg | INTRAMUSCULAR | Status: DC | PRN

## 2018-01-03 MED ORDER — SUGAMMADEX SODIUM 200 MG/2ML IV SOLN
INTRAVENOUS | Status: AC
  Filled 2018-01-03: qty 2

## 2018-01-03 MED ORDER — DEXAMETHASONE SODIUM PHOSPHATE 10 MG/ML IJ SOLN
INTRAMUSCULAR | Status: DC | PRN
  Administered 2018-01-03: 10 mg via INTRAVENOUS

## 2018-01-03 MED ORDER — ARTIFICIAL TEARS OPHTHALMIC OINT
TOPICAL_OINTMENT | OPHTHALMIC | Status: AC
  Filled 2018-01-03: qty 3.5

## 2018-01-03 MED ORDER — ONDANSETRON HCL 4 MG/2ML IJ SOLN
4.0000 mg | Freq: Four times a day (QID) | INTRAMUSCULAR | Status: DC | PRN
  Administered 2018-01-03 (×2): 4 mg via INTRAVENOUS

## 2018-01-03 MED ORDER — LIDOCAINE 2% (20 MG/ML) 5 ML SYRINGE
INTRAMUSCULAR | Status: AC
  Filled 2018-01-03: qty 5

## 2018-01-03 MED ORDER — LACTATED RINGERS IV SOLN
INTRAVENOUS | Status: DC
  Administered 2018-01-03: 10:00:00 via INTRAVENOUS

## 2018-01-03 MED ORDER — NITROGLYCERIN 0.4 MG SL SUBL: 0.4000 mg | SUBLINGUAL_TABLET | SUBLINGUAL | Status: DC | PRN

## 2018-01-03 MED ORDER — MIDAZOLAM HCL 2 MG/2ML IJ SOLN
INTRAMUSCULAR | Status: DC | PRN
  Administered 2018-01-03: 2 mg via INTRAVENOUS

## 2018-01-03 MED ORDER — FENTANYL CITRATE (PF) 250 MCG/5ML IJ SOLN
INTRAMUSCULAR | Status: AC
Start: 2018-01-03 — End: ?
  Filled 2018-01-03: qty 5

## 2018-01-03 MED ORDER — FENTANYL CITRATE (PF) 250 MCG/5ML IJ SOLN
INTRAMUSCULAR | Status: DC | PRN
  Administered 2018-01-03: 50 ug via INTRAVENOUS
  Administered 2018-01-03: 100 ug via INTRAVENOUS
  Administered 2018-01-03: 50 ug via INTRAVENOUS
  Administered 2018-01-03: 100 ug via INTRAVENOUS
  Administered 2018-01-03: 50 ug via INTRAVENOUS

## 2018-01-03 MED ORDER — SENNOSIDES-DOCUSATE SODIUM 8.6-50 MG PO TABS: 1.0000 | ORAL_TABLET | Freq: Every evening | ORAL | Status: DC | PRN

## 2018-01-03 MED ORDER — ROCURONIUM BROMIDE 10 MG/ML (PF) SYRINGE
PREFILLED_SYRINGE | INTRAVENOUS | Status: AC
  Filled 2018-01-03: qty 5

## 2018-01-03 MED ORDER — SCOPOLAMINE 1 MG/3DAYS TD PT72
MEDICATED_PATCH | TRANSDERMAL | Status: AC
  Filled 2018-01-03: qty 1

## 2018-01-03 MED ORDER — HYDROMORPHONE HCL 1 MG/ML IJ SOLN
INTRAMUSCULAR | Status: AC
  Administered 2018-01-03: 0.5 mg via INTRAVENOUS
  Filled 2018-01-03: qty 1

## 2018-01-03 MED ORDER — PROPOFOL 10 MG/ML IV BOLUS
INTRAVENOUS | Status: AC
  Filled 2018-01-03: qty 20

## 2018-01-03 MED ORDER — MONTELUKAST SODIUM 10 MG PO TABS: 10.0000 mg | ORAL_TABLET | Freq: Every day | ORAL | Status: DC

## 2018-01-03 MED ORDER — OXYCODONE-ACETAMINOPHEN 5-325 MG PO TABS
ORAL_TABLET | ORAL | Status: AC
  Filled 2018-01-03: qty 1

## 2018-01-03 MED ORDER — PROPOFOL 10 MG/ML IV BOLUS
INTRAVENOUS | Status: DC | PRN
  Administered 2018-01-03: 200 mg via INTRAVENOUS

## 2018-01-03 MED ORDER — OXYCODONE-ACETAMINOPHEN 5-325 MG PO TABS
1.0000 | ORAL_TABLET | ORAL | Status: DC | PRN
  Administered 2018-01-03 – 2018-01-04 (×2): 1 via ORAL

## 2018-01-03 MED ORDER — ROPIVACAINE HCL 5 MG/ML IJ SOLN
INTRAMUSCULAR | Status: AC
  Filled 2018-01-03: qty 30

## 2018-01-03 MED ORDER — SODIUM CHLORIDE 0.9 % IV SOLN
INTRAVENOUS | Status: DC | PRN
  Administered 2018-01-03: 120 mL

## 2018-01-03 MED ORDER — HYDROMORPHONE HCL 1 MG/ML IJ SOLN
0.2500 mg | INTRAMUSCULAR | Status: DC | PRN
  Administered 2018-01-03 (×3): 0.5 mg via INTRAVENOUS

## 2018-01-03 MED ORDER — SCOPOLAMINE 1 MG/3DAYS TD PT72
MEDICATED_PATCH | TRANSDERMAL | Status: DC | PRN
  Administered 2018-01-03: 1 via TRANSDERMAL

## 2018-01-03 MED ORDER — SODIUM CHLORIDE 0.9 % IJ SOLN
INTRAMUSCULAR | Status: AC
  Filled 2018-01-03: qty 50

## 2018-01-03 MED ORDER — MIDAZOLAM HCL 2 MG/2ML IJ SOLN
INTRAMUSCULAR | Status: AC
  Filled 2018-01-03: qty 2

## 2018-01-03 MED ORDER — METOCLOPRAMIDE HCL 5 MG/ML IJ SOLN
10.0000 mg | Freq: Once | INTRAMUSCULAR | Status: AC
  Administered 2018-01-03: 10 mg via INTRAVENOUS

## 2018-01-03 MED ORDER — STERILE WATER FOR IRRIGATION IR SOLN
Status: DC | PRN
  Administered 2018-01-03: 1000 mL

## 2018-01-03 MED ORDER — ARTIFICIAL TEARS OPHTHALMIC OINT
TOPICAL_OINTMENT | OPHTHALMIC | Status: DC | PRN
  Administered 2018-01-03: 1 via OPHTHALMIC

## 2018-01-03 MED ORDER — FLUTICASONE PROPIONATE 50 MCG/ACT NA SUSP: 1.0000 | Freq: Every day | NASAL | Status: DC | PRN

## 2018-01-03 SURGICAL SUPPLY — 54 items
ADH SKN CLS APL DERMABOND .7 (GAUZE/BANDAGES/DRESSINGS) ×2
BARRIER ADHS 3X4 INTERCEED (GAUZE/BANDAGES/DRESSINGS) IMPLANT
BRR ADH 4X3 ABS CNTRL BYND (GAUZE/BANDAGES/DRESSINGS)
CANISTER SUCT 3000ML PPV (MISCELLANEOUS) ×3 IMPLANT
CATH FOLEY 2WAY SLVR  5CC 16FR (CATHETERS) ×1
CATH FOLEY 2WAY SLVR 5CC 16FR (CATHETERS) ×2 IMPLANT
COVER BACK TABLE 60X90IN (DRAPES) ×3 IMPLANT
COVER TIP SHEARS 8 DVNC (MISCELLANEOUS) ×2 IMPLANT
COVER TIP SHEARS 8MM DA VINCI (MISCELLANEOUS) ×1
DECANTER SPIKE VIAL GLASS SM (MISCELLANEOUS) ×5 IMPLANT
DEFOGGER SCOPE WARMER CLEARIFY (MISCELLANEOUS) ×3 IMPLANT
DERMABOND ADVANCED (GAUZE/BANDAGES/DRESSINGS) ×1
DERMABOND ADVANCED .7 DNX12 (GAUZE/BANDAGES/DRESSINGS) ×2 IMPLANT
DRAPE ARM DVNC X/XI (DISPOSABLE) ×6 IMPLANT
DRAPE COLUMN DVNC XI (DISPOSABLE) ×2 IMPLANT
DRAPE DA VINCI XI ARM (DISPOSABLE) ×4
DRAPE DA VINCI XI COLUMN (DISPOSABLE) ×1
DURAPREP 26ML APPLICATOR (WOUND CARE) ×3 IMPLANT
ELECT REM PT RETURN 15FT ADLT (MISCELLANEOUS) ×3 IMPLANT
GLOVE BIO SURGEON STRL SZ7 (GLOVE) ×9 IMPLANT
GLOVE BIOGEL PI IND STRL 7.0 (GLOVE) ×4 IMPLANT
GLOVE BIOGEL PI INDICATOR 7.0 (GLOVE) ×2
IRRIG SUCT STRYKERFLOW 2 WTIP (MISCELLANEOUS) ×3
IRRIGATION SUCT STRKRFLW 2 WTP (MISCELLANEOUS) ×2 IMPLANT
LEGGING LITHOTOMY PAIR STRL (DRAPES) ×1 IMPLANT
MANIPULATOR ADVINCU DEL 2.5 PL (MISCELLANEOUS) IMPLANT
MANIPULATOR ADVINCU DEL 3.0 PL (MISCELLANEOUS) ×1 IMPLANT
MANIPULATOR ADVINCU DEL 3.5 PL (MISCELLANEOUS) IMPLANT
MANIPULATOR ADVINCU DEL 4.0 PL (MISCELLANEOUS) IMPLANT
NEEDLE INSUFFLATION 120MM (ENDOMECHANICALS) ×3 IMPLANT
OBTURATOR OPTICAL STANDARD 8MM (TROCAR) ×1
OBTURATOR OPTICAL STND 8 DVNC (TROCAR) ×2
OBTURATOR OPTICALSTD 8 DVNC (TROCAR) ×2 IMPLANT
PACK ROBOT WH (CUSTOM PROCEDURE TRAY) ×3 IMPLANT
PACK ROBOTIC GOWN (GOWN DISPOSABLE) ×3 IMPLANT
PACK TRENDGUARD 450 HYBRID PRO (MISCELLANEOUS) IMPLANT
PAD PREP 24X48 CUFFED NSTRL (MISCELLANEOUS) ×3 IMPLANT
POSITIONER SURGICAL ARM (MISCELLANEOUS) ×9 IMPLANT
POUCH LAPAROSCOPIC INSTRUMENT (MISCELLANEOUS) IMPLANT
SEAL CANN UNIV 5-8 DVNC XI (MISCELLANEOUS) ×6 IMPLANT
SEAL XI 5MM-8MM UNIVERSAL (MISCELLANEOUS) ×4
SET CYSTO W/LG BORE CLAMP LF (SET/KITS/TRAYS/PACK) ×3 IMPLANT
SET TRI-LUMEN FLTR TB AIRSEAL (TUBING) ×3 IMPLANT
SUT DVC VLOC 180 0 12IN GS21 (SUTURE)
SUT VIC AB 2-0 CT2 27 (SUTURE) ×6 IMPLANT
SUT VIC AB 2-0 UR6 27 (SUTURE) ×2 IMPLANT
SUT VICRYL RAPIDE 3 0 (SUTURE) ×6 IMPLANT
SUT VLOC 180 0 9IN  GS21 (SUTURE) ×2
SUT VLOC 180 0 9IN GS21 (SUTURE) ×2 IMPLANT
SUTURE DVC VLC 180 0 12IN GS21 (SUTURE) IMPLANT
TOWEL OR 17X26 10 PK STRL BLUE (TOWEL DISPOSABLE) ×6 IMPLANT
TRENDGUARD 450 HYBRID PRO PACK (MISCELLANEOUS) ×3
TROCAR PORT AIRSEAL 5X120 (TROCAR) ×3 IMPLANT
WATER STERILE IRR 1000ML POUR (IV SOLUTION) ×3 IMPLANT

## 2018-01-03 NOTE — Op Note (Addendum)
01/03/2018  12:15 PM  PATIENT:  Brittney [name]  37 y.o. female  PRE-OPERATIVE DIAGNOSIS:  DYSMENORRHEA MENORRHAGIA  POST-OPERATIVE DIAGNOSIS:  DYSMENORRHEA MENORRHAGIA  PROCEDURE:  Procedure(s) with comments: XI ROBOTIC ASSISTED LAPAROSCOPIC HYSTERECTOMY AND SALPINGECTOMY (Bilateral) - WITH BED AFTER CYSTOSCOPY (N/A)  SURGEON:  Surgeon(s) and Role:    * Bobbye Charleston, MD - Primary    * Jerelyn Charles, MD - Assisting  ANESTHESIA:   general  EBL:  100 mL   LOCAL MEDICATIONS USED:  OTHER Ropivicaine  SPECIMEN:  Source of Specimen:  uterus, cervix and tubes  DISPOSITION OF SPECIMEN:  PATHOLOGY  COUNTS:  YES  TOURNIQUET:  * No tourniquets in log *  DICTATION: .Note written in EPIC  PLAN OF CARE: Admit for overnight observation  PATIENT DISPOSITION:  PACU - hemodynamically stable.   Delay start of Pharmacological VTE agent (>24hrs) due to surgical blood loss or risk of bleeding: not applicable         Findings:  11 weeks size uterus with two fibroids.  R and L ovary were normal (L had small normal cysts).  The ureters were identified during multiple points of the case and were always out of the field of dissection.  On cystoscopy, the bladder was intact and bilateral spill was seen from each ureteral orriface.    Medications:  Ancef.  Ropivicaine.    Technique:  After adequate anesthesia was achieved the patient was positioned, prepped and draped in usual sterile fashion.  A speculum was placed in the vagina and the cervix dilated with pratt dilators.  The 3 cm Koh ring Advincula was assembled and placed in proper fashion.  The  Speculum was removed and the bladder catheterized with a foley.    Attention was turned to the abdomen where a 1 cm incision was made 1 cm above the umbilicus.  The veress needle was introduced without aspiration of bowel contents or blood and the abdomen insufflated. The 8.5 mm Robotic trocar was placed and the  other three trocar sites were marked out, all approximately 10 cm from each other and the camera.  Two 8.5 mm trocars were placed on either side of the camera port and a 5 mm assistant port was placed 3 cm above the line of the other trocars.  All trocars were inserted under direct visualization of the camera.  The patient was placed in trendelenburg and then the Robot docked.  The fenestrated bipolar were placed on arm 1 and the Hot shears on arm 3 and introduced under direct visualization of the camera.  I then broke scrub and sat down at the console.  The above findings were noted and the ureters identified well out of the field of dissection.  The right fallopian tube was isolated and cauterized with the bipolar.  The Utero-ovarian ligament was then divided with the bipolar cautery and shears.  The posterior broad ligament was then divided with the hot shears until the uterosacral ligament.  The Broad and cardinal ligaments were then cauterized against the cervix to the level of the Koh ring, securing the uterine artery.  Each pedicle was then incised with the shears.  The anterior leaf was then incised at the reflection of the vessico-uterine junction and the lateral bladder retracted inferiorly after the round ligament had been divided with the bipolar forceps.  The left tube was cauterized with the bipolar and divided with the shears;  then the left utero-ovarian ligament divided with the bipolar forceps and the scissors.  The round ligament was divided as well and the posterior leaf of the broad ligament then divided with the hot shears. The broad and cardinal ligaments were then cauterized on the left in the same way.   At the level of the internal os, the uterine arteries were bilaterally cauterized with the bipolar.  The ureters were identified well out of the field of dissection.    The bladder was then able to be retracted inferiorly and the vesico-uterine fascia was incised in the midline until  the bladder was removed one cm below the Koh ring.  The hot shears then circumferentially incised the vagina at the level of the reflection on the [facility] ring.  Once the uterus and cervix were amputated, cautery was used to insure hemostasis of the cuff.  Once hemostasis was achieved, the scissors were changed to the mega suture cut needle driver and the cuff was closed with a running stitches of 0-vicryl V loc.  Cautery was used to ensure hemostasis of the left pedicles very superficially. The ureters were peristalsing bilaterally well and very lateral to the areas of operation.    The Robot was then undocked and I scrubbed back in.  The needle was removed and Ropivicaine was introduced into the pelvis. The skin incisions were closed with subcuticular stitches of 3-0 vicryl Rapide and Dermabond.  All instruments were removed from the vagina and cystoscopy performed, revealing an intact bladder and vigourous spill of urine from each ureteral orifice.  The cystoscope was removed and the patient taken to the recovery room in stable condition.  Carleah Yablonski A

## 2018-01-03 NOTE — Anesthesia Procedure Notes (Signed)
Procedure Name: Intubation Date/Time: 01/03/2018 10:17 AM Performed by: Raenette Rover, CRNA Pre-anesthesia Checklist: Patient identified, Emergency Drugs available, Suction available and Patient being monitored Patient Re-evaluated:Patient Re-evaluated prior to induction Oxygen Delivery Method: Circle system utilized Preoxygenation: Pre-oxygenation with 100% oxygen Induction Type: IV induction Ventilation: Mask ventilation without difficulty Laryngoscope Size: Mac and 3 Grade View: Grade I Tube type: Oral Tube size: 7.0 mm Number of attempts: 1 Airway Equipment and Method: Stylet Placement Confirmation: ETT inserted through vocal cords under direct vision,  positive ETCO2,  CO2 detector and breath sounds checked- equal and bilateral Secured at: 21 cm Tube secured with: Tape Dental Injury: Teeth and Oropharynx as per pre-operative assessment

## 2018-01-03 NOTE — Progress Notes (Signed)
There has been no change in the patients history, status or exam since the history and physical.  Vitals:   01/03/18 0837  BP: 131/89  Pulse: 71  Resp: 16  Temp: 98.7 F (37.1 C)  TempSrc: Oral  SpO2: 100%  Weight: 294 lb (133.4 kg)  Height: 5\' 8"  (1.727 m)    No results found for this or any previous visit (from the past 72 hour(s)).  Jareli Highland A

## 2018-01-03 NOTE — Transfer of Care (Signed)
Immediate Anesthesia Transfer of Care Note  Patient: Brittney [name]  Procedure(s) Performed: XI ROBOTIC ASSISTED LAPAROSCOPIC HYSTERECTOMY AND SALPINGECTOMY (Bilateral ) CYSTOSCOPY (N/A )  Patient Location: PACU  Anesthesia Type:General  Level of Consciousness: awake, alert , oriented, drowsy and patient cooperative  Airway & Oxygen Therapy: Patient Spontanous Breathing and Patient connected to face mask oxygen  Post-op Assessment: Report given to RN and Post -op Vital signs reviewed and stable  Post vital signs: Reviewed and stable  Last Vitals:  Vitals Value Taken Time  BP 134/82 01/03/2018 12:31 PM  Temp    Pulse 68 01/03/2018 12:38 PM  Resp 14 01/03/2018 12:38 PM  SpO2 99 % 01/03/2018 12:38 PM  Vitals shown include unvalidated device data.  Last Pain:  Vitals:   01/03/18 0946  TempSrc:   PainSc: 0-No pain         Complications: No apparent anesthesia complications

## 2018-01-03 NOTE — Anesthesia Preprocedure Evaluation (Signed)
Anesthesia Evaluation  Patient identified by MRN, date of birth, ID band Patient awake    Reviewed: Allergy & Precautions, H&P , NPO status , Patient's Chart, lab work & pertinent test results, reviewed documented beta blocker date and time   History of Anesthesia Complications Negative for: history of anesthetic complications  Airway Mallampati: I  TM Distance: >3 FB Neck ROM: full    Dental  (+) Teeth Intact   Pulmonary neg pulmonary ROS,    breath sounds clear to auscultation       Cardiovascular + CAD   Rhythm:regular Rate:Normal     Neuro/Psych  Headaches (migraines), PSYCHIATRIC DISORDERS (panic attacks)  Neuromuscular disease negative psych ROS   GI/Hepatic Neg liver ROS, GERD  Medicated,  Endo/Other  Morbid obesity  Renal/GU negative Renal ROS     Musculoskeletal  (+) Arthritis ,   Abdominal   Peds  Hematology  (+) anemia ,   Anesthesia Other Findings   Reproductive/Obstetrics (+) Pregnancy                             Anesthesia Physical  Anesthesia Plan  ASA: III  Anesthesia Plan: General   Post-op Pain Management:    Induction: Intravenous  PONV Risk Score and Plan: 4 or greater and Ondansetron, Dexamethasone, Midazolam and Scopolamine patch - Pre-op  Airway Management Planned: Oral ETT  Additional Equipment:   Intra-op Plan:   Post-operative Plan: Extubation in OR  Informed Consent: I have reviewed the patients History and Physical, chart, labs and discussed the procedure including the risks, benefits and alternatives for the proposed anesthesia with the patient or authorized representative who has indicated his/her understanding and acceptance.   Dental advisory given  Plan Discussed with: CRNA  Anesthesia Plan Comments:         Anesthesia Quick Evaluation

## 2018-01-03 NOTE — Brief Op Note (Signed)
01/03/2018  12:15 PM  PATIENT:  Brittney [name]  37 y.o. female  PRE-OPERATIVE DIAGNOSIS:  DYSMENORRHEA MENORRHAGIA  POST-OPERATIVE DIAGNOSIS:  DYSMENORRHEA MENORRHAGIA  PROCEDURE:  Procedure(s) with comments: XI ROBOTIC ASSISTED LAPAROSCOPIC HYSTERECTOMY AND SALPINGECTOMY (Bilateral) - WITH BED AFTER CYSTOSCOPY (N/A)  SURGEON:  Surgeon(s) and Role:    * Bobbye Charleston, MD - Primary    * Jerelyn Charles, MD - Assisting  ANESTHESIA:   general  EBL:  100 mL   LOCAL MEDICATIONS USED:  OTHER Ropivicaine  SPECIMEN:  Source of Specimen:  uterus, cervix and tubes  DISPOSITION OF SPECIMEN:  PATHOLOGY  COUNTS:  YES  TOURNIQUET:  * No tourniquets in log *  DICTATION: .Note written in EPIC  PLAN OF CARE: Admit for overnight observation  PATIENT DISPOSITION:  PACU - hemodynamically stable.   Delay start of Pharmacological VTE agent (>24hrs) due to surgical blood loss or risk of bleeding: not applicable

## 2018-01-04 ENCOUNTER — Encounter (HOSPITAL_COMMUNITY): Payer: Self-pay | Admitting: Obstetrics and Gynecology

## 2018-01-04 DIAGNOSIS — D259 Leiomyoma of uterus, unspecified: Secondary | ICD-10-CM | POA: Diagnosis not present

## 2018-01-04 MED ORDER — OXYCODONE-ACETAMINOPHEN 5-325 MG PO TABS: 1.0000 | ORAL_TABLET | ORAL | 0 refills | Status: DC | PRN

## 2018-01-04 MED ORDER — SENNOSIDES-DOCUSATE SODIUM 8.6-50 MG PO TABS: 1.0000 | ORAL_TABLET | Freq: Every evening | ORAL | 2 refills | Status: DC | PRN

## 2018-01-04 MED ORDER — OXYCODONE-ACETAMINOPHEN 5-325 MG PO TABS
ORAL_TABLET | ORAL | Status: AC
  Filled 2018-01-04: qty 1

## 2018-01-04 MED ORDER — IBUPROFEN 800 MG PO TABS: 800.0000 mg | ORAL_TABLET | Freq: Three times a day (TID) | ORAL | 0 refills | Status: DC | PRN

## 2018-01-04 MED ORDER — KETOROLAC TROMETHAMINE 30 MG/ML IJ SOLN
INTRAMUSCULAR | Status: AC
  Filled 2018-01-04: qty 1

## 2018-01-04 NOTE — Discharge Instructions (Signed)
°  DISCHARGE INSTRUCTIONS: Laparoscopy  The following instructions have been prepared to help you care for yourself upon your return home today.  Wound care:  Do not get the incision wet for the first 24 hours. The incision should be kept clean and dry.  The Band-Aids or dressings may be removed the day after surgery.  Should the incision become sore, red, and swollen after the first week, check with your doctor.  Personal hygiene:  Shower the day after your procedure.  Activity and limitations:  Do NOT drive or operate any equipment today.  Do NOT lift anything more than 15 pounds for 2-3 weeks after surgery.  Do NOT rest in bed all day.  Walking is encouraged. Walk each day, starting slowly with 5-minute walks 3 or 4 times a day. Slowly increase the length of your walks.  Walk up and down stairs slowly.  Do NOT do strenuous activities, such as golfing, playing tennis, bowling, running, biking, weight lifting, gardening, mowing, or vacuuming for 2-4 weeks. Ask your doctor when it is okay to start.  Diet: Eat a light meal as desired this evening. You may resume your usual diet tomorrow.  Return to work: This is dependent on the type of work you do. For the most part you can return to a desk job within a week of surgery. If you are more active at work, please discuss this with your doctor.  What to expect after your surgery: You may have a slight burning sensation when you urinate on the first day. You may have a very small amount of blood in the urine. Expect to have a small amount of vaginal discharge/light bleeding for 1-2 weeks. It is not unusual to have abdominal soreness and bruising for up to 2 weeks. You may be tired and need more rest for about 1 week. You may experience shoulder pain for 24-72 hours. Lying flat in bed may relieve it.  Call your doctor for any of the following:  Develop a fever of 100.4 or greater  Inability to urinate 6 hours after discharge from  hospital  Severe pain not relieved by pain medications  Persistent of heavy bleeding at incision site  Redness or swelling around incision site after a week  Increasing nausea or vomiting.

## 2018-01-04 NOTE — Discharge Summary (Signed)
Physician Discharge Summary  Patient ID: EMOREE SASAKI MRN: [id number] DOB/AGE: [birth year] 37 y.o.  Admit date: 01/03/2018 Discharge date: 01/04/2018  Admission [facility] fibroid uterus  Discharge Diagnoses: same Active Problems:   Postoperative state   Discharged Condition: good  Hospital Course: Uncomplicated robotic TLH/Salpingectomies/cysto  Consults: None  Significant Diagnostic Studies: none  Treatments: surgery:  robotic TLH/Salpingectomies/cysto  Discharge Exam: Blood pressure 126/76, pulse 64, temperature 98 F (36.7 C), resp. rate 16, height 5\' 8"  (1.727 m), weight 294 lb (133.4 kg), last menstrual period 12/21/2017, SpO2 98 %.   Disposition: home    Follow-up Information    Bobbye Charleston, MD Follow up in 2 week(s).   Specialty:  Obstetrics and Gynecology Contact information: [place] [address] [phone]           Signed: Eiliana Drone A 01/04/2018, 7:16 AM

## 2018-01-04 NOTE — Anesthesia Postprocedure Evaluation (Signed)
Anesthesia Post Note  Patient: Brittney [name]  Procedure(s) Performed: XI ROBOTIC ASSISTED LAPAROSCOPIC HYSTERECTOMY AND SALPINGECTOMY (Bilateral ) CYSTOSCOPY (N/A )     Patient location during evaluation: PACU Anesthesia Type: General Level of consciousness: sedated and patient cooperative Pain management: pain level controlled Vital Signs Assessment: post-procedure vital signs reviewed and stable Respiratory status: spontaneous breathing Cardiovascular status: stable Anesthetic complications: no    Last Vitals:  Vitals:   01/03/18 2200 01/04/18 0130  BP: 134/82 126/76  Pulse: 69 64  Resp: 16 16  Temp: 37.1 C 36.7 C  SpO2: 100% 98%    Last Pain:  Vitals:   01/04/18 0315  TempSrc:   PainSc: [place]

## 2018-01-04 NOTE — Progress Notes (Signed)
Patient is eating, ambulating, and voiding.  Pain control is good.  Vitals:   01/03/18 1600 01/03/18 1700 01/03/18 2200 01/04/18 0130  BP: 125/73 122/86 134/82 126/76  Pulse: 73 78 69 64  Resp: 16 16 16 16   Temp: 98.5 F (36.9 C) 98.3 F (36.8 C) 98.7 F (37.1 C) 98 F (36.7 C)  TempSrc:      SpO2: 100% 100% 100% 98%  Weight:      Height:        lungs:   clear to auscultation cor:    RRR Abdomen:  soft, appropriate tenderness, incisions intact and without erythema or exudate. ex:    no cords   Lab Results  Component Value Date   WBC 6.5 12/22/2017   HGB 11.6 (L) 12/22/2017   HCT 37.0 12/22/2017   MCV 78.4 12/22/2017   PLT 319 12/22/2017    A/P  Routine care.  Expect d/c per plan.

## 2018-05-16 ENCOUNTER — Encounter: Payer: Self-pay | Admitting: Family Medicine

## 2018-05-16 ENCOUNTER — Ambulatory Visit (INDEPENDENT_AMBULATORY_CARE_PROVIDER_SITE_OTHER): Payer: BC Managed Care – PPO | Admitting: Family Medicine

## 2018-05-16 VITALS — BP 116/68 | HR 64 | Temp 98.6°F | Ht 66.5 in | Wt 291.2 lb

## 2018-05-16 DIAGNOSIS — Z23 Encounter for immunization: Secondary | ICD-10-CM | POA: Diagnosis not present

## 2018-05-16 DIAGNOSIS — H6502 Acute serous otitis media, left ear: Secondary | ICD-10-CM | POA: Diagnosis not present

## 2018-05-16 DIAGNOSIS — H8110 Benign paroxysmal vertigo, unspecified ear: Secondary | ICD-10-CM | POA: Insufficient documentation

## 2018-05-16 DIAGNOSIS — H8113 Benign paroxysmal vertigo, bilateral: Secondary | ICD-10-CM | POA: Diagnosis not present

## 2018-05-16 DIAGNOSIS — H6692 Otitis media, unspecified, left ear: Secondary | ICD-10-CM | POA: Insufficient documentation

## 2018-05-16 MED ORDER — FLUCONAZOLE 150 MG PO TABS: 150.0000 mg | ORAL_TABLET | Freq: Once | ORAL | 0 refills | Status: AC

## 2018-05-16 MED ORDER — AMOXICILLIN 250 MG/5ML PO SUSR: 500.0000 mg | Freq: Two times a day (BID) | ORAL | 0 refills | Status: AC

## 2018-05-16 MED ORDER — MECLIZINE HCL 25 MG PO TABS: 25.0000 mg | ORAL_TABLET | Freq: Three times a day (TID) | ORAL | 0 refills | Status: DC | PRN

## 2018-05-16 MED ORDER — FLUTICASONE PROPIONATE 50 MCG/ACT NA SUSP: 2.0000 | Freq: Every day | NASAL | 6 refills | Status: DC | PRN

## 2018-05-16 NOTE — Patient Instructions (Signed)
Take amoxicillin for ear infection  Diflucan if you get a yeast infection  Tylenol or ibuprofen for ear pain as needed flonase daily through allergy season   If dizzy- take meclizine with caution of sedation  Change position slowly  Update if not starting to improve in a week or if worsening    Keep walking for weight loss

## 2018-05-16 NOTE — Assessment & Plan Note (Signed)
Suspect related to L OM  Will tx that  Also flonase for ETD  Meclizine 25 mg tid prn dizziness (warned of sedation) She may not need this  If no imp disc benefit of PT  Update if not starting to improve in a week or if worsening

## 2018-05-16 NOTE — Progress Notes (Signed)
Subjective:    Patient ID: Brittney [name], female    DOB: 1980-11-11, 37 y.o.   MRN: [id number]  HPI  Here for ear fullness and feeling dizzy   Wt Readings from Last 3 Encounters:  05/16/18 291 lb 4 oz (132.1 kg)  01/03/18 294 lb (133.4 kg)  12/22/17 294 lb (133.4 kg)  has been walking during lunch- wt is down it  46.30 kg/m   Had a dizzy spell this am  Lasted about 10 minutes- room was spinning and she sat down  A little dizzy on and off   Had a nurse check her ears - and thought she may have infection  L ear hurts (several days)  R ear feels stopped up   Not a lot of nasal symptoms  No drainage    Patient Active Problem List   Diagnosis Date Noted  . Otitis media, left 05/16/2018  . Benign paroxysmal positional vertigo 05/16/2018  . Postoperative state 01/03/2018  . Lipoma of back 10/04/2017  . Routine general medical examination at a health care facility 08/29/2017  . Fibrocystic breast changes 01/08/2016  . Hemorrhoids, external 09/30/2014  . Chronic diarrhea 08/29/2014  . 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 Hemorrhoids, internal, with bleeding 04/22/2014  . Family history of pulmonary embolism 09/02/2013  . Family history of diabetes mellitus 09/02/2013  . Cutaneous skin tags 05/15/2013  . DUB (dysfunctional uterine bleeding) 09/24/2012  . 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 EDEMA 11/04/2009  . Morbid obesity ([place]) 03/31/2008  . HEARING LOSS, MILD 03/31/2008  . MIGRAINE HEADACHE 02/20/2007  . IBS 02/20/2007  . OSTEOARTHRITIS 02/20/2007   Past Medical History:  Diagnosis Date  . Anemia   . Colon polyps   . Coronary artery abnormality    spasms   . Coronary artery spasm [facility])    reports saw cardiologist for chest pains , was told she was having coronary artery spasms , sent for cardio w/u with stress and echo  both unremarkable. reports today has not had any spasms or chest pains in over 2 years    . Fibroids   . Gallstones   .  Irritable bowel syndrome   . Migraine    hasnt had one in a long time  . Obesity, unspecified    Past Surgical History:  Procedure Laterality Date  . CHOLECYSTECTOMY  05/06/11  . COLONOSCOPY  11/2001   polyp; negative pathology  . COLONOSCOPY  09/2004   Negative  . COLONOSCOPY  2017 or 2018 patient  unsure    GI ;   . COLONOSCOPY W/ POLYPECTOMY    . CYSTOSCOPY N/A 01/03/2018   Procedure: CYSTOSCOPY;  Surgeon: Bobbye Charleston, MD;  Location: [facility];  Service: Gynecology;  Laterality: N/A;  . ESOPHAGOGASTRODUODENOSCOPY  1/10   normal  . FLEXIBLE SIGMOIDOSCOPY  03/05/2012   Procedure: FLEXIBLE SIGMOIDOSCOPY;  Surgeon: Inda Castle, MD;  Location: [facility];  Service: Endoscopy;  Laterality: N/A;  . LAPAROSCOPIC DECORTICATION / DUBULKING / ABLATION RENAL CYSTS     saline histogram  . ROBOTIC ASSISTED LAPAROSCOPIC HYSTERECTOMY AND SALPINGECTOMY Bilateral 01/03/2018   Procedure: XI ROBOTIC ASSISTED LAPAROSCOPIC HYSTERECTOMY AND SALPINGECTOMY;  Surgeon: Bobbye Charleston, MD;  Location: [facility];  Service: Gynecology;  Laterality: Bilateral;  WITH BED AFTER  . TONSILLECTOMY  07/30/07  . Uterine US  09/2005   Large endometrial stripe; small ovarian cyst   Social History   Tobacco Use  . Smoking status: Never Smoker  . Smokeless tobacco: Never Used  Substance Use Topics  . Alcohol use: No    Alcohol/week: 0.0 standard drinks  . Drug use: No   Family History  Problem Relation Age of Onset  . Heart disease Father   . Hypertension Father   . Heart attack Father   . Diabetes Father   . Ulcers Brother   . Clotting disorder Brother   . Ovarian cancer Maternal Grandmother   . Colon polyps Maternal Grandmother   . Colon cancer Maternal Grandmother        40's  . Diabetes Paternal Grandmother   . Hypertension Paternal Grandmother   . Breast cancer Maternal Aunt        Age 28's  . Diabetes Paternal Aunt        x 5  . Lung cancer Maternal Grandfather        SMOKER    Allergies  Allergen Reactions  . Prevacid [Lansoprazole] Rash   Current Outpatient Medications on File Prior to Visit  Medication Sig Dispense Refill  . triamcinolone cream (KENALOG) 0.1 % Apply 1 application topically 2 (two) times daily as needed (for Eczema).     No current facility-administered medications on file prior to visit.     Review of Systems  Constitutional: Negative for activity change, appetite change, fatigue, fever and unexpected weight change.  HENT: Positive for ear pain. Negative for congestion, ear discharge, facial swelling, hearing loss, postnasal drip, rhinorrhea, sinus pressure, sinus pain, sore throat and voice change.   Eyes: Negative for pain, redness and visual disturbance.  Respiratory: Negative for cough, shortness of breath and wheezing.   Cardiovascular: Negative for chest pain and palpitations.  Gastrointestinal: Negative for abdominal pain, blood in stool, constipation and diarrhea.  Endocrine: Negative for polydipsia and polyuria.  Genitourinary: Negative for dysuria, frequency and urgency.  Musculoskeletal: Negative for arthralgias, back pain and myalgias.  Skin: Negative for pallor and rash.  Allergic/Immunologic: Negative for environmental allergies.  Neurological: Positive for dizziness. Negative for tremors, syncope, facial asymmetry, speech difficulty, weakness, light-headedness, numbness and headaches.  Hematological: Negative for adenopathy. Does not bruise/bleed easily.  Psychiatric/Behavioral: Negative for decreased concentration and dysphoric mood. The patient is not nervous/anxious.        Objective:   Physical Exam  Constitutional: She appears well-developed and well-nourished. No distress.  obese and well appearing   HENT:  Head: Normocephalic and atraumatic.  Right Ear: External ear normal.  Left Ear: External ear normal.  Mouth/Throat: Oropharynx is clear and moist. No oropharyngeal exudate.  Nares are boggy  L TM is  retracted and pink in color / dull with effusion but clear canal  R ear canal has partial cerumen impaction with nl appearing TM  Eyes: Pupils are equal, round, and reactive to light. Conjunctivae and EOM are normal. Right eye exhibits no discharge. Left eye exhibits no discharge.  Neck: Normal range of motion. Neck supple. No JVD present. No thyromegaly present.  No bruits  Cardiovascular: Normal rate, regular rhythm and normal heart sounds.  No murmur heard. Pulmonary/Chest: Effort normal and breath sounds normal. No respiratory distress. She has no wheezes.  Musculoskeletal: She exhibits no edema.  Lymphadenopathy:    She has no cervical adenopathy.  Neurological: She is alert. She displays no atrophy, no tremor and normal reflexes. No cranial nerve deficit. She exhibits normal muscle tone. She displays a negative Romberg sign. Coordination normal.  bilat 2-3 beats of horizontal nystagmus (with reproduction of dizziness)  Skin: Skin is warm and dry. No rash noted.  Psychiatric: She has a normal mood and affect.          Assessment & Plan:   Problem List Items Addressed This Visit      Nervous and Auditory   Benign paroxysmal positional vertigo    Suspect related to L OM  Will tx that  Also flonase for ETD  Meclizine 25 mg tid prn dizziness (warned of sedation) She may not need this  If no imp disc benefit of PT  Update if not starting to improve in a week or if worsening        Otitis media, left - Primary    Serous OM with pain  Px flonase for ETD Amoxicillin for infection  (with diflucan 150 mg prn if she gets a yeast infection)  Watch vertigo/likely related Analgesics for pain  Rest/fluids Update if not starting to improve in a week or if worsening        Relevant Medications   amoxicillin (AMOXIL) 250 MG/5ML suspension   fluconazole (DIFLUCAN) 150 MG tablet    Other Visit Diagnoses    Need for influenza vaccination       Relevant Orders   Flu Vaccine QUAD  6+ mos PF IM (Fluarix Quad PF) (Completed)

## 2018-05-16 NOTE — Assessment & Plan Note (Signed)
Serous OM with pain  Px flonase for ETD Amoxicillin for infection  (with diflucan 150 mg prn if she gets a yeast infection)  Watch vertigo/likely related Analgesics for pain  Rest/fluids Update if not starting to improve in a week or if worsening

## 2018-05-28 ENCOUNTER — Encounter: Payer: Self-pay | Admitting: Family Medicine

## 2018-05-29 MED ORDER — FLUCONAZOLE 150 MG PO TABS: 150.0000 mg | ORAL_TABLET | Freq: Once | ORAL | 0 refills | Status: AC

## 2018-05-29 NOTE — Telephone Encounter (Signed)
Sent diflucan

## 2018-06-06 ENCOUNTER — Ambulatory Visit (INDEPENDENT_AMBULATORY_CARE_PROVIDER_SITE_OTHER): Payer: BC Managed Care – PPO | Admitting: Family Medicine

## 2018-06-06 ENCOUNTER — Encounter: Payer: Self-pay | Admitting: Family Medicine

## 2018-06-06 ENCOUNTER — Ambulatory Visit: Payer: BC Managed Care – PPO | Admitting: Family Medicine

## 2018-06-06 VITALS — BP 126/86 | HR 77 | Temp 98.3°F | Ht 66.5 in | Wt 280.8 lb

## 2018-06-06 DIAGNOSIS — R300 Dysuria: Secondary | ICD-10-CM | POA: Diagnosis not present

## 2018-06-06 LAB — POC URINALSYSI DIPSTICK (AUTOMATED)
BILIRUBIN UA: NEGATIVE
GLUCOSE UA: NEGATIVE
Ketones, UA: NEGATIVE
Leukocytes, UA: NEGATIVE
Nitrite, UA: NEGATIVE
Protein, UA: NEGATIVE
RBC UA: NEGATIVE
Urobilinogen, UA: 0.2 E.U./dL
pH, UA: 6 (ref 5.0–8.0)

## 2018-06-06 NOTE — Progress Notes (Signed)
Subjective:    Patient ID: Brittney [name], female    DOB: [birth year], 37 y.o.   MRN: [id number]  HPI Here with urinary symptoms  Burning with urination  Started Monday Comes and goes   A lot of bladder pressure  Perhaps a little pink on wiping Sunday  Urgency/frequency- but cannot always stop to go   Has been drinking water   No fever or nausea  Recently tx a yeast infx- diflucan    UA -high SG Otherwise clear  Results for orders placed or performed in visit on 06/06/18  POCT Urinalysis Dipstick (Automated)  Result Value Ref Range   Color, UA Amber    Clarity, UA Cloudy    Glucose, UA Negative Negative   Bilirubin, UA Negative    Ketones, UA Negative    Spec Grav, UA >=1.030 (A) 1.010 - 1.025   Blood, UA Negative    pH, UA 6.0 5.0 - 8.0   Protein, UA Negative Negative   Urobilinogen, UA 0.2 0.2 or 1.0 E.U./dL   Nitrite, UA Negative    Leukocytes, UA Negative Negative    Patient Active Problem List   Diagnosis Date Noted  . Dysuria 06/06/2018  . Otitis media, left 05/16/2018  . Benign paroxysmal positional vertigo 05/16/2018  . Postoperative state 01/03/2018  . Lipoma of back 10/04/2017  . Routine general medical examination at a health care facility 08/29/2017  . Fibrocystic breast changes 01/08/2016  . Hemorrhoids, external 09/30/2014  . Chronic diarrhea 08/29/2014  . 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 Hemorrhoids, internal, with bleeding 04/22/2014  . Family history of pulmonary embolism 09/02/2013  . Family history of diabetes mellitus 09/02/2013  . Cutaneous skin tags 05/15/2013  . DUB (dysfunctional uterine bleeding) 09/24/2012  . 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 EDEMA 11/04/2009  . Morbid obesity ([place]) 03/31/2008  . HEARING LOSS, MILD 03/31/2008  . MIGRAINE HEADACHE 02/20/2007  . IBS 02/20/2007  . OSTEOARTHRITIS 02/20/2007   Past Medical History:  Diagnosis Date  . Anemia   . Colon polyps   . Coronary artery abnormality      spasms   . Coronary artery spasm [facility])    reports saw cardiologist for chest pains , was told she was having coronary artery spasms , sent for cardio w/u with stress and echo  both unremarkable. reports today has not had any spasms or chest pains in over 2 years    . Fibroids   . Gallstones   . Irritable bowel syndrome   . Migraine    hasnt had one in a long time  . Obesity, unspecified    Past Surgical History:  Procedure Laterality Date  . CHOLECYSTECTOMY  05/06/11  . COLONOSCOPY  11/2001   polyp; negative pathology  . COLONOSCOPY  09/2004   Negative  . COLONOSCOPY  2017 or 2018 patient  unsure    GI ;   . COLONOSCOPY W/ POLYPECTOMY    . CYSTOSCOPY N/A 01/03/2018   Procedure: CYSTOSCOPY;  Surgeon: Bobbye Charleston, MD;  Location: [facility];  Service: Gynecology;  Laterality: N/A;  . ESOPHAGOGASTRODUODENOSCOPY  1/10   normal  . FLEXIBLE SIGMOIDOSCOPY  03/05/2012   Procedure: FLEXIBLE SIGMOIDOSCOPY;  Surgeon: Inda Castle, MD;  [facility];  Service: Endoscopy;  Laterality: N/A;  . LAPAROSCOPIC DECORTICATION / DUBULKING / ABLATION RENAL CYSTS     saline histogram  . ROBOTIC ASSISTED LAPAROSCOPIC HYSTERECTOMY AND SALPINGECTOMY Bilateral 01/03/2018   Procedure: XI ROBOTIC ASSISTED LAPAROSCOPIC HYSTERECTOMY AND SALPINGECTOMY;  Surgeon: Bobbye Charleston, MD;  Location[facility];  Service: Gynecology;  Laterality: Bilateral;  WITH BED AFTER  . TONSILLECTOMY  07/30/07  . Uterine US  09/2005   Large endometrial stripe; small ovarian cyst   Social History   Tobacco Use  . Smoking status: Never Smoker  . Smokeless tobacco: Never Used  Substance Use Topics  . Alcohol use: No    Alcohol/week: 0.0 standard drinks  . Drug use: No   Family History  Problem Relation Age of Onset  . Heart disease Father   . Hypertension Father   . Heart attack Father   . Diabetes Father   . Ulcers Brother   . Clotting disorder Brother   . Ovarian cancer Maternal Grandmother   .  Colon polyps Maternal Grandmother   . Colon cancer Maternal Grandmother        40's  . Diabetes Paternal Grandmother   . Hypertension Paternal Grandmother   . Breast cancer Maternal Aunt        Age 42's  . Diabetes Paternal Aunt        x 5  . Lung cancer Maternal Grandfather        SMOKER   Allergies  Allergen Reactions  . Prevacid [Lansoprazole] Rash   Current Outpatient Medications on File Prior to Visit  Medication Sig Dispense Refill  . fluticasone (FLONASE) 50 MCG/ACT nasal spray Place 2 sprays into both nostrils daily as needed (for allergies.). 16 g 6  . meclizine (ANTIVERT) 25 MG tablet Take 1 tablet (25 mg total) by mouth 3 (three) times daily as needed for dizziness (vertigo). Caution of sedation 20 tablet 0  . triamcinolone cream (KENALOG) 0.1 % Apply 1 application topically 2 (two) times daily as needed (for Eczema).     No current facility-administered medications on file prior to visit.     Review of Systems  Constitutional: Negative for activity change, appetite change, fatigue and fever.  HENT: Negative for congestion and sore throat.   Eyes: Negative for itching and visual disturbance.  Respiratory: Negative for cough and shortness of breath.   Cardiovascular: Negative for leg swelling.  Gastrointestinal: Negative for abdominal distention, abdominal pain, constipation, diarrhea and nausea.  Endocrine: Negative for cold intolerance and polydipsia.  Genitourinary: Positive for dysuria, frequency and urgency. Negative for difficulty urinating, flank pain and hematuria.  Musculoskeletal: Negative for myalgias.  Skin: Negative for rash.  Allergic/Immunologic: Negative for immunocompromised state.  Neurological: Negative for dizziness and weakness.  Hematological: Negative for adenopathy.       Objective:   Physical Exam  Constitutional: She appears well-developed and well-nourished. No distress.  obese and well appearing   HENT:  Head: Normocephalic and  atraumatic.  Eyes: Pupils are equal, round, and reactive to light. Conjunctivae and EOM are normal.  Neck: Normal range of motion. Neck supple.  Cardiovascular: Normal rate, regular rhythm and normal heart sounds.  Pulmonary/Chest: Effort normal and breath sounds normal.  Abdominal: Soft. Bowel sounds are normal. She exhibits no distension. There is tenderness. There is no rebound.  No cva tenderness  Mild suprapubic tenderness  Musculoskeletal: She exhibits no edema.  Lymphadenopathy:    She has no cervical adenopathy.  Neurological: She is alert.  Skin: No rash noted.  Psychiatric: She has a normal mood and affect.          Assessment & Plan:   Problem List Items Addressed This Visit      Other   Dysuria - Primary  Suspect due concentrated urine (does not get frequent breaks to empty bladder at work so restrict fluids) ua otherwise nl  Enc water intake 64 oz daily  Also avoid bladder irritants- see AVS Ucx pend -tx if pos Update if not starting to improve in a week or if worsening        Relevant Orders   POCT Urinalysis Dipstick (Automated) (Completed)   Urine Culture (Completed)

## 2018-06-06 NOTE — Patient Instructions (Signed)
You have very concentrated urine  This can irritate bladder and urethra - mimicking uti symptoms Drink lots of water (not caffeine or soda or acidic beverages0  We will culture your urine and update you   Update if not starting to improve in a week or if worsening

## 2018-06-07 LAB — URINE CULTURE
MICRO NUMBER: 91275118
SPECIMEN QUALITY:: ADEQUATE

## 2018-06-10 NOTE — Assessment & Plan Note (Addendum)
Suspect due concentrated urine (does not get frequent breaks to empty bladder at work so restrict fluids) ua otherwise nl  Enc water intake 64 oz daily  Also avoid bladder irritants- see AVS Ucx pend -tx if pos Update if not starting to improve in a week or if worsening

## 2018-08-17 ENCOUNTER — Encounter: Payer: Self-pay | Admitting: Family Medicine

## 2018-08-17 ENCOUNTER — Encounter: Payer: BC Managed Care – PPO | Admitting: Family Medicine

## 2018-08-17 ENCOUNTER — Ambulatory Visit: Payer: BC Managed Care – PPO | Admitting: Family Medicine

## 2018-08-17 VITALS — BP 118/70 | HR 67 | Temp 98.2°F | Wt 265.0 lb

## 2018-08-17 DIAGNOSIS — J069 Acute upper respiratory infection, unspecified: Secondary | ICD-10-CM | POA: Diagnosis not present

## 2018-08-17 DIAGNOSIS — H6993 Unspecified Eustachian tube disorder, bilateral: Secondary | ICD-10-CM | POA: Diagnosis not present

## 2018-08-17 MED ORDER — OXYMETAZOLINE HCL 0.05 % NA SOLN: 1.0000 | Freq: Two times a day (BID) | NASAL | 0 refills | Status: DC

## 2018-08-17 MED ORDER — LORATADINE 10 MG PO TABS: 10.0000 mg | ORAL_TABLET | Freq: Every day | ORAL | 11 refills | Status: DC | PRN

## 2018-08-17 MED ORDER — PSEUDOEPHEDRINE HCL 30 MG PO TABS: 30.0000 mg | ORAL_TABLET | ORAL | 0 refills | Status: DC | PRN

## 2018-08-17 NOTE — Progress Notes (Signed)
Subjective:    Patient ID: Brittney [name], female    DOB: [birth year], 38 y.o.   MRN: [id number]  HPI This is a 38 yo female who presents today with 3 days of post nasal drainage, ear fullness- bilateral. Has been using flonase for about 3 days. Had headache but it has resolved. Irritated throat. No cough, no SOB, no wheeze. No fever. Has been a little tired but no muscle aches. Has history of ear infections. No pain currently in ears, no drainage, just pressure.   Has been exercising and watching diet. Has lost over 25 pounds.     Past Medical History:  Diagnosis Date  . Anemia   . Colon polyps   . Coronary artery abnormality    spasms   . Coronary artery spasm [facility])    reports saw cardiologist for chest pains , was told she was having coronary artery spasms , sent for cardio w/u with stress and echo  both unremarkable. reports today has not had any spasms or chest pains in over 2 years    . Fibroids   . Gallstones   . Irritable bowel syndrome   . Migraine    hasnt had one in a long time  . Obesity, unspecified    Past Surgical History:  Procedure Laterality Date  . CHOLECYSTECTOMY  05/06/11  . COLONOSCOPY  11/2001   polyp; negative pathology  . COLONOSCOPY  09/2004   Negative  . COLONOSCOPY  2017 or 2018 patient  unsure   [facility] ;   . COLONOSCOPY W/ POLYPECTOMY    . CYSTOSCOPY N/A 01/03/2018   Procedure: CYSTOSCOPY;  Surgeon: Bobbye Charleston, MD;  [facility];  Service: Gynecology;  Laterality: N/A;  . ESOPHAGOGASTRODUODENOSCOPY  1/10   normal  . FLEXIBLE SIGMOIDOSCOPY  03/05/2012   Procedure: FLEXIBLE SIGMOIDOSCOPY;  Surgeon: Inda Castle, MD;  [facility];  Service: Endoscopy;  Laterality: N/A;  . LAPAROSCOPIC DECORTICATION / DUBULKING / ABLATION RENAL CYSTS     saline histogram  . ROBOTIC ASSISTED LAPAROSCOPIC HYSTERECTOMY AND SALPINGECTOMY Bilateral 01/03/2018   Procedure: XI ROBOTIC ASSISTED LAPAROSCOPIC HYSTERECTOMY AND SALPINGECTOMY;   Surgeon: Bobbye Charleston, MD;  [facility];  Service: Gynecology;  Laterality: Bilateral;  WITH BED AFTER  . TONSILLECTOMY  07/30/07  . Uterine US  09/2005   Large endometrial stripe; small ovarian cyst   Family History  Problem Relation Age of Onset  . Heart disease Father   . Hypertension Father   . Heart attack Father   . Diabetes Father   . Ulcers Brother   . Clotting disorder Brother   . Ovarian cancer Maternal Grandmother   . Colon polyps Maternal Grandmother   . Colon cancer Maternal Grandmother        40's  . Diabetes Paternal Grandmother   . Hypertension Paternal Grandmother   . Breast cancer Maternal Aunt        Age 12's  . Diabetes Paternal Aunt        x 5  . Lung cancer Maternal Grandfather        SMOKER   Social History   Tobacco Use  . Smoking status: Never Smoker  . Smokeless tobacco: Never Used  Substance Use Topics  . Alcohol use: No    Alcohol/week: 0.0 standard drinks  . Drug use: No      Review of Systems Per HPI    Objective:   Physical Exam Vitals signs reviewed.  Constitutional:  Appearance: She is obese. She is not ill-appearing.  HENT:     Head: Normocephalic and atraumatic.     Right Ear: Ear canal and external ear normal.     Left Ear: Ear canal and external ear normal.     Ears:     Comments: Bilateral TMs dull. No erythema or retracting/bulging.     Nose: Rhinorrhea (clear) present.     Mouth/Throat:     Mouth: Mucous membranes are moist.     Pharynx: Oropharynx is clear.  Eyes:     Conjunctiva/sclera: Conjunctivae normal.  Neck:     Musculoskeletal: Normal range of motion.  Cardiovascular:     Rate and Rhythm: Normal rate and regular rhythm.     Heart sounds: Normal heart sounds.  Pulmonary:     Effort: Pulmonary effort is normal.     Breath sounds: Normal breath sounds.  Lymphadenopathy:     Cervical: No cervical adenopathy.  Skin:    General: Skin is warm and dry.  Neurological:     Mental Status: She is  alert and oriented to person, place, and time.  Psychiatric:        Mood and Affect: Mood normal.        Behavior: Behavior normal.        Thought Content: Thought content normal.        Judgment: Judgment normal.       BP 118/70   Pulse 67   Temp 98.2 F (36.8 C) (Oral)   Wt 265 lb (120.2 kg)   LMP 12/21/2017   SpO2 98%   BMI 42.13 kg/m  Wt Readings from Last 3 Encounters:  08/17/18 265 lb (120.2 kg)  06/06/18 280 lb 12 oz (127.3 kg)  05/16/18 291 lb 4 oz (132.1 kg)       Assessment & Plan:  1. Eustachian tube disorder, bilateral - Provided written and verbal information regarding diagnosis and treatment. - RTC precautions reviewed - loratadine (CLARITIN) 10 MG tablet; Take 1 tablet (10 mg total) by mouth daily as needed for allergies.  Dispense: 30 tablet; Refill: 11 - pseudoephedrine (SUDAFED) 30 MG tablet; Take 1 tablet (30 mg total) by mouth every 4 (four) hours as needed for congestion.  Dispense: 30 tablet; Refill: 0 - oxymetazoline (AFRIN) 0.05 % nasal spray; Place 1 spray into both nostrils 2 (two) times daily. Do not use more than 4 days  Dispense: 20 mL; Refill: 0 - continue flonase  2. Viral URI - RTC precautions reviewed - symptomatic treatment as above  Clarene Reamer, FNP-BC  [facility]  08/17/2018 8:27 AM

## 2018-08-17 NOTE — Patient Instructions (Signed)
Good to see you today  I have sent loratadine (a non drowsy antihistamine), generic Sudafed and generic afrin nasal spray to your pharmacy.   If not better in 7-10 days or if worsening ear pain, fever over 101- please follow up

## 2018-08-23 ENCOUNTER — Encounter: Payer: Self-pay | Admitting: Family Medicine

## 2018-08-23 ENCOUNTER — Ambulatory Visit: Payer: BC Managed Care – PPO | Admitting: Family Medicine

## 2018-08-23 VITALS — BP 112/66 | HR 61 | Temp 98.2°F | Ht 66.5 in | Wt 269.5 lb

## 2018-08-23 DIAGNOSIS — H1033 Unspecified acute conjunctivitis, bilateral: Secondary | ICD-10-CM | POA: Diagnosis not present

## 2018-08-23 DIAGNOSIS — J069 Acute upper respiratory infection, unspecified: Secondary | ICD-10-CM

## 2018-08-23 MED ORDER — IBUPROFEN 600 MG PO TABS: 600.0000 mg | ORAL_TABLET | Freq: Three times a day (TID) | ORAL | 1 refills | Status: DC | PRN

## 2018-08-23 MED ORDER — POLYMYXIN B-TRIMETHOPRIM 10000-0.1 UNIT/ML-% OP SOLN: 1.0000 [drp] | OPHTHALMIC | 0 refills | Status: DC

## 2018-08-23 NOTE — Assessment & Plan Note (Signed)
Along with conjunctivitis  Disc nature of viral uri  Px ibuprofen for pain/headache or fever if she needs it  May help nasal congestion Also recommend nasal saline and flonase  Update if not starting to improve in a week or if worsening

## 2018-08-23 NOTE — Assessment & Plan Note (Signed)
Exposed to bacterial conjunctivitis in her family  No vision changes Also has viral uri  inst to throw out contact lenses and wear glasses until better  Px poly trim opth drops  Update if not starting to improve in a week or if worsening   For symptoms- cool compress Disc hygeine for linens and hands to prev transmission

## 2018-08-23 NOTE — Progress Notes (Signed)
Subjective:    Patient ID: Brittney [name], female    DOB: 1980-09-23, 38 y.o.   MRN: [id number]  HPI Here for suspected pink eye  Since Monday- eye d/c Crusted shut in am  Itchy/uncomfortable   Daughter and her teacher have pink eye   Some pnd and ears feel full  Cheeks feel puffy/? Pressure   Used flonase occ afrin  Drinking lots of fluids  No eye drops  Has contacts-in now    Wt Readings from Last 3 Encounters:  08/23/18 269 lb 8 oz (122.2 kg)  08/17/18 265 lb (120.2 kg)  06/06/18 280 lb 12 oz (127.3 kg)   42.85 kg/m  Enc healthy diet and exercise   Patient Active Problem List   Diagnosis Date Noted  . Acute conjunctivitis of both eyes 08/23/2018  . Viral URI 08/23/2018  . Dysuria 06/06/2018  . Otitis media, left 05/16/2018  . Benign paroxysmal positional vertigo 05/16/2018  . Postoperative state 01/03/2018  . Lipoma of back 10/04/2017  . Routine general medical examination at a health care facility 08/29/2017  . Fibrocystic breast changes 01/08/2016  . Hemorrhoids, external 09/30/2014  . Chronic diarrhea 08/29/2014  . 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 Hemorrhoids, internal, with bleeding 04/22/2014  . Family history of pulmonary embolism 09/02/2013  . Family history of diabetes mellitus 09/02/2013  . Cutaneous skin tags 05/15/2013  . DUB (dysfunctional uterine bleeding) 09/24/2012  . 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 EDEMA 11/04/2009  . Morbid obesity ([place]) 03/31/2008  . HEARING LOSS, MILD 03/31/2008  . MIGRAINE HEADACHE 02/20/2007  . IBS 02/20/2007  . OSTEOARTHRITIS 02/20/2007   Past Medical History:  Diagnosis Date  . Anemia   . Colon polyps   . Coronary artery abnormality    spasms   . Coronary artery spasm [facility])    reports saw cardiologist for chest pains , was told she was having coronary artery spasms , sent for cardio w/u with stress and echo  both unremarkable. reports today has not had any spasms or chest pains in  over 2 years    . Fibroids   . Gallstones   . Irritable bowel syndrome   . Migraine    hasnt had one in a long time  . Obesity, unspecified    Past Surgical History:  Procedure Laterality Date  . CHOLECYSTECTOMY  05/06/11  . COLONOSCOPY  11/2001   polyp; negative pathology  . COLONOSCOPY  09/2004   Negative  . COLONOSCOPY  2017 or 2018 patient  unsure   [facility] ;   . COLONOSCOPY W/ POLYPECTOMY    . CYSTOSCOPY N/A 01/03/2018   Procedure: CYSTOSCOPY;  Surgeon: Bobbye Charleston, MD;  Location: [facility];  Service: Gynecology;  Laterality: N/A;  . ESOPHAGOGASTRODUODENOSCOPY  1/10   normal  . FLEXIBLE SIGMOIDOSCOPY  03/05/2012   Procedure: FLEXIBLE SIGMOIDOSCOPY;  Surgeon: Inda Castle, MD;  Location: [facility];  Service: Endoscopy;  Laterality: N/A;  . LAPAROSCOPIC DECORTICATION / DUBULKING / ABLATION RENAL CYSTS     saline histogram  . ROBOTIC ASSISTED LAPAROSCOPIC HYSTERECTOMY AND SALPINGECTOMY Bilateral 01/03/2018   Procedure: XI ROBOTIC ASSISTED LAPAROSCOPIC HYSTERECTOMY AND SALPINGECTOMY;  Surgeon: Bobbye Charleston, MD;  Location: [facility];  Service: Gynecology;  Laterality: Bilateral;  WITH BED AFTER  . TONSILLECTOMY  07/30/07  . Uterine US  09/2005   Large endometrial stripe; small ovarian cyst   Social History   Tobacco Use  . Smoking status: Never Smoker  . Smokeless tobacco: Never Used  Substance Use Topics  . Alcohol use: No    Alcohol/week: 0.0 standard drinks  . Drug use: No   Family History  Problem Relation Age of Onset  . Heart disease Father   . Hypertension Father   . Heart attack Father   . Diabetes Father   . Ulcers Brother   . Clotting disorder Brother   . Ovarian cancer Maternal Grandmother   . Colon polyps Maternal Grandmother   . Colon cancer Maternal Grandmother        40's  . Diabetes Paternal Grandmother   . Hypertension Paternal Grandmother   . Breast cancer Maternal Aunt        Age 78's  . Diabetes Paternal Aunt        x 5  . Lung  cancer Maternal Grandfather        SMOKER   Allergies  Allergen Reactions  . Prevacid [Lansoprazole] Rash   Current Outpatient Medications on File Prior to Visit  Medication Sig Dispense Refill  . fluticasone (FLONASE) 50 MCG/ACT nasal spray Place 2 sprays into both nostrils daily as needed (for allergies.). 16 g 6  . loratadine (CLARITIN) 10 MG tablet Take 1 tablet (10 mg total) by mouth daily as needed for allergies. 30 tablet 11  . meclizine (ANTIVERT) 25 MG tablet Take 1 tablet (25 mg total) by mouth 3 (three) times daily as needed for dizziness (vertigo). Caution of sedation 20 tablet 0  . oxymetazoline (AFRIN) 0.05 % nasal spray Place 1 spray into both nostrils 2 (two) times daily. Do not use more than 4 days 20 mL 0  . pseudoephedrine (SUDAFED) 30 MG tablet Take 1 tablet (30 mg total) by mouth every 4 (four) hours as needed for congestion. 30 tablet 0  . triamcinolone cream (KENALOG) 0.1 % Apply 1 application topically 2 (two) times daily as needed (for Eczema).     No current facility-administered medications on file prior to visit.     Review of Systems  Constitutional: Positive for appetite change and fatigue. Negative for fever.  HENT: Positive for congestion, postnasal drip, rhinorrhea, sinus pressure, sneezing and sore throat. Negative for ear pain.   Eyes: Positive for discharge, redness and itching. Negative for photophobia, pain and visual disturbance.  Respiratory: Positive for cough. Negative for shortness of breath, wheezing and stridor.   Cardiovascular: Negative for chest pain.  Gastrointestinal: Negative for diarrhea, nausea and vomiting.  Genitourinary: Negative for frequency, hematuria and urgency.  Musculoskeletal: Negative for arthralgias and myalgias.  Skin: Negative for rash.  Neurological: Positive for headaches. Negative for dizziness, weakness and light-headedness.  Psychiatric/Behavioral: Negative for confusion and dysphoric mood.       Objective:    Physical Exam Constitutional:      General: She is not in acute distress.    Appearance: Normal appearance. She is well-developed. She is obese. She is not ill-appearing, toxic-appearing or diaphoretic.  HENT:     Head: Normocephalic and atraumatic.     Comments: Nares are injected and congested    No sinus tenderness    Right Ear: Tympanic membrane, ear canal and external ear normal.     Left Ear: Tympanic membrane, ear canal and external ear normal.     Nose: Congestion and rhinorrhea present.     Mouth/Throat:     Mouth: Mucous membranes are moist.     Pharynx: Oropharynx is clear. No oropharyngeal exudate or posterior oropharyngeal erythema.     Comments: Clear pnd  Eyes:  General:        Right eye: Discharge present.        Left eye: Discharge present.    Pupils: Pupils are equal, round, and reactive to light.     Comments: Mild conj injection with clear d/c Scant crust at lateral corner of L eyelid (lower)  Neck:     Musculoskeletal: Normal range of motion and neck supple.  Cardiovascular:     Rate and Rhythm: Normal rate.     Heart sounds: Normal heart sounds.  Pulmonary:     Effort: Pulmonary effort is normal. No respiratory distress.     Breath sounds: Normal breath sounds. No stridor. No wheezing, rhonchi or rales.  Chest:     Chest wall: No tenderness.  Lymphadenopathy:     Cervical: No cervical adenopathy.  Skin:    General: Skin is warm and dry.     Capillary Refill: Capillary refill takes less than 2 seconds.     Findings: No rash.  Neurological:     Mental Status: She is alert.     Cranial Nerves: No cranial nerve deficit.  Psychiatric:        Mood and Affect: Mood normal.           Assessment & Plan:   Problem List Items Addressed This Visit      Respiratory   Viral URI    Along with conjunctivitis  Disc nature of viral uri  Px ibuprofen for pain/headache or fever if she needs it  May help nasal congestion Also recommend nasal saline and  flonase  Update if not starting to improve in a week or if worsening          Other   Morbid obesity ([place])    Discussed how this problem influences overall health and the risks it imposes  Reviewed plan for weight loss with lower calorie diet (via better food choices and also portion control or program like weight watchers) and exercise building up to or more than 30 minutes 5 days per week including some aerobic activity         Acute conjunctivitis of both eyes - Primary    Exposed to bacterial conjunctivitis in her family  No vision changes Also has viral uri  inst to throw out contact lenses and wear glasses until better  Px poly trim opth drops  Update if not starting to improve in a week or if worsening   For symptoms- cool compress Disc hygeine for linens and hands to prev transmission

## 2018-08-23 NOTE — Patient Instructions (Signed)
No contacts until better Throw these out Wash hands a lot  Wash linens frequently  Warm wet washcloth for crust and discharge Cool compress for itch and swelling  Use the polytrim drops until better   Update if not starting to improve in a week or if worsening    For viral upper respiratory infection  Ibuprofen for headache/sinus pain / fever  Fluids Rest  flonase Nasal saline when needed  Afrin- only if absolutely necessary

## 2018-08-23 NOTE — Assessment & Plan Note (Signed)
Discussed how this problem influences overall health and the risks it imposes  Reviewed plan for weight loss with lower calorie diet (via better food choices and also portion control or program like weight watchers) and exercise building up to or more than 30 minutes 5 days per week including some aerobic activity    

## 2018-09-03 ENCOUNTER — Ambulatory Visit: Payer: BC Managed Care – PPO | Admitting: Family Medicine

## 2018-09-03 ENCOUNTER — Encounter: Payer: Self-pay | Admitting: Family Medicine

## 2018-09-03 VITALS — BP 104/78 | HR 58 | Temp 98.3°F | Ht 66.5 in | Wt 266.5 lb

## 2018-09-03 DIAGNOSIS — B373 Candidiasis of vulva and vagina: Secondary | ICD-10-CM

## 2018-09-03 DIAGNOSIS — H66002 Acute suppurative otitis media without spontaneous rupture of ear drum, left ear: Secondary | ICD-10-CM

## 2018-09-03 DIAGNOSIS — B3731 Acute candidiasis of vulva and vagina: Secondary | ICD-10-CM

## 2018-09-03 MED ORDER — AMOXICILLIN-POT CLAVULANATE 400-57 MG/5ML PO SUSR: 800.0000 mg | Freq: Two times a day (BID) | ORAL | 0 refills | Status: AC

## 2018-09-03 MED ORDER — FLUCONAZOLE 150 MG PO TABS: 150.0000 mg | ORAL_TABLET | Freq: Once | ORAL | 1 refills | Status: AC

## 2018-09-03 MED ORDER — AMOXICILLIN 400 MG/5ML PO SUSR: 800.0000 mg | Freq: Two times a day (BID) | ORAL | 0 refills | Status: DC

## 2018-09-03 NOTE — Progress Notes (Signed)
Subjective:     Brittney [name] is a 38 y.o. female presenting for Vaginal Itching (and discharge. Started on 08/30/2018. No odor. No urinary symptoms.) and Ear Pain (Left ear mainly. Started on 08/28/2018. She does have nasal congestion and post nasal drainage. No fever. Used Flonase, and Singluiar/Zyrtec. )     Otalgia   There is pain in the left ear. This is a new problem. The current episode started in the past 7 days. The problem has been gradually worsening. There has been no fever. The pain is at a severity of 6/10. Associated symptoms include coughing, rhinorrhea and a sore throat. Pertinent negatives include no abdominal pain, diarrhea, ear discharge, headaches, hearing loss, neck pain or vomiting. Treatments tried: allergy medication. The treatment provided mild relief. There is no history of a tympanostomy tube.   #Vaginal itching - white discharge - pretty sure it is a yeast infection - no recent abx use - symptoms started last Thursday - sex about 1 week ago - no new partners    Review of Systems  Constitutional: Negative for chills and fever.  HENT: Positive for congestion, ear pain, postnasal drip, rhinorrhea and sore throat. Negative for ear discharge, hearing loss, sinus pressure and sinus pain.   Respiratory: Positive for cough. Negative for shortness of breath.   Cardiovascular: Negative for chest pain.  Gastrointestinal: Negative for abdominal pain, diarrhea and vomiting.  Genitourinary: Positive for vaginal discharge. Negative for dysuria, flank pain, vaginal bleeding and vaginal pain.  Musculoskeletal: Negative for neck pain.  Neurological: Negative for headaches.     Social History   Tobacco Use  Smoking Status Never Smoker  Smokeless Tobacco Never Used        Objective:    BP Readings from Last 3 Encounters:  09/03/18 104/78  08/23/18 112/66  08/17/18 118/70   Wt Readings from Last 3 Encounters:  09/03/18 266 lb 8 oz (120.9 kg)  08/23/18  269 lb 8 oz (122.2 kg)  08/17/18 265 lb (120.2 kg)    BP 104/78   Pulse (!) 58   Temp 98.3 F (36.8 C)   Ht 5' 6.5" (1.689 m)   Wt 266 lb 8 oz (120.9 kg)   LMP 12/21/2017   SpO2 99%   BMI 42.37 kg/m    Physical Exam Constitutional:      General: She is not in acute distress.    Appearance: She is well-developed. She is not diaphoretic.  HENT:     Head: Normocephalic and atraumatic.     Right Ear: Ear canal normal. A middle ear effusion is present. Tympanic membrane is not injected, erythematous or bulging.     Left Ear: Ear canal normal. A middle ear effusion is present. Tympanic membrane is erythematous and bulging.     Nose: Mucosal edema and rhinorrhea present.     Right Sinus: No maxillary sinus tenderness or frontal sinus tenderness.     Left Sinus: No maxillary sinus tenderness or frontal sinus tenderness.     Mouth/Throat:     Pharynx: Uvula midline. Posterior oropharyngeal erythema present. No oropharyngeal exudate.     Tonsils: Swelling: 0 on the right. 0 on the left.  Eyes:     General: No scleral icterus.    Conjunctiva/sclera: Conjunctivae normal.  Neck:     Musculoskeletal: Neck supple.  Cardiovascular:     Rate and Rhythm: Normal rate and regular rhythm.     Heart sounds: Normal heart sounds. No murmur.  Pulmonary:  Effort: Pulmonary effort is normal. No respiratory distress.     Breath sounds: Normal breath sounds.  Genitourinary:    Pubic Area: No rash.      Vagina: Vaginal discharge (white) and erythema present.  Lymphadenopathy:     Cervical: No cervical adenopathy.  Skin:    General: Skin is warm and dry.     Capillary Refill: Capillary refill takes less than 2 seconds.  Neurological:     Mental Status: She is alert.           Assessment & Plan:   Problem List Items Addressed This Visit      Nervous and Auditory   Otitis media, left - Primary   Relevant Medications   fluconazole (DIFLUCAN) 150 MG tablet   amoxicillin-clavulanate  (AUGMENTIN) 400-57 MG/5ML suspension    Other Visit Diagnoses    Vaginal candidiasis       Relevant Medications   fluconazole (DIFLUCAN) 150 MG tablet     Per patient request - would like liquid antibiotics which are prescribed  Exam and hx consistent with yeast infection. Treatment w/ 1 refill as starting antibiotic  Return if symptoms worsen or fail to improve.  Lesleigh Noe, MD

## 2018-09-03 NOTE — Patient Instructions (Addendum)
Ear infection  - Take the antibiotic for 7 days - if not improved returned - Ibuprofen or tylenol for pain - continue taking allergy medication and flonase if this is helping with the other symptoms  Yeast infection - take a fluconazole today - then you can refill and take it again if you develop symptoms with the antibiotics

## 2018-09-18 ENCOUNTER — Encounter: Payer: Self-pay | Admitting: Family Medicine

## 2018-09-18 ENCOUNTER — Ambulatory Visit (INDEPENDENT_AMBULATORY_CARE_PROVIDER_SITE_OTHER): Payer: BC Managed Care – PPO | Admitting: Family Medicine

## 2018-09-18 VITALS — BP 114/70 | HR 57 | Temp 97.9°F | Ht 66.5 in | Wt 266.0 lb

## 2018-09-18 DIAGNOSIS — Z0000 Encounter for general adult medical examination without abnormal findings: Secondary | ICD-10-CM

## 2018-09-18 MED ORDER — FLUTICASONE PROPIONATE 50 MCG/ACT NA SUSP: 2.0000 | Freq: Every day | NASAL | 3 refills | Status: DC | PRN

## 2018-09-18 MED ORDER — MONTELUKAST SODIUM 10 MG PO TABS: 10.0000 mg | ORAL_TABLET | Freq: Every day | ORAL | 3 refills | Status: DC

## 2018-09-18 MED ORDER — LEVOCETIRIZINE DIHYDROCHLORIDE 5 MG PO TABS: 5.0000 mg | ORAL_TABLET | Freq: Every evening | ORAL | 3 refills | Status: DC

## 2018-09-18 NOTE — Assessment & Plan Note (Signed)
Reviewed health habits including diet and exercise and skin cancer prevention Reviewed appropriate screening tests for age  Also reviewed health mt list, fam hx and immunization status , as well as social and family history   See HPI Labs ordered for wellness Commended on good health habits Nl breast exam -will plan to start mammograms at 38 utd colonoscopy

## 2018-09-18 NOTE — Patient Instructions (Addendum)
Take the 2nd diflucan for your vaginal symptoms   Keep up the great work with diet and exercise and weight loss Labs today  I refilled your medicines   Take care of yourself

## 2018-09-18 NOTE — Progress Notes (Signed)
Subjective:    Patient ID: Brittney [name], female    DOB: [birth year], 38 y.o.   MRN: [id number]  HPI  Here for health maintenance exam and to review chronic medical problems    Wt Readings from Last 3 Encounters:  09/18/18 266 lb (120.7 kg)  09/03/18 266 lb 8 oz (120.9 kg)  08/23/18 269 lb 8 oz (122.2 kg)  is eating a lot healthier now  [facility]  [place]  [facility] [employer] to [facility] with a trainer  42.29 kg/m   She meal preps all day sundays (also for other people)   Pap 2/16 neg  (had neg one since then with gyn)  1/15- neg with neg HPV  H/o uterine fibroids -then had hysterectomy 5/19  Menses were heavy before that  Sees Dr Philis Pique  No lumps on self breast exam  No first degree breast cancer in family   Colonoscopy 2/18 -report indicates 63 y recall   Tetanus shot 1/19   HIV screen neg 2012 Declines STD screening   Getting over a yeast infection - took a diflucan and will take another  Improved/ still a little d/c left   Flu shot 10/19   Due for wellness labs today   Patient Active Problem List   Diagnosis Date Noted  . Benign paroxysmal positional vertigo 05/16/2018  . Postoperative state 01/03/2018  . Lipoma of back 10/04/2017  . Routine general medical examination at a health care facility 08/29/2017  . Fibrocystic breast changes 01/08/2016  . Hemorrhoids, external 09/30/2014  . Chronic diarrhea 08/29/2014  . 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 Hemorrhoids, internal, with bleeding 04/22/2014  . Family history of pulmonary embolism 09/02/2013  . Family history of diabetes mellitus 09/02/2013  . Cutaneous skin tags 05/15/2013  . 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 Morbid obesity ([place]) 03/31/2008  . HEARING LOSS, MILD 03/31/2008  . MIGRAINE HEADACHE 02/20/2007  . IBS 02/20/2007  . OSTEOARTHRITIS 02/20/2007   Past Medical History:  Diagnosis Date  . Anemia   . Colon  polyps   . Coronary artery abnormality    spasms   . Coronary artery spasm [facility])    reports saw cardiologist for chest pains , was told she was having coronary artery spasms , sent for cardio w/u with stress and echo  both unremarkable. reports today has not had any spasms or chest pains in over 2 years    . Fibroids   . Gallstones   . Irritable bowel syndrome   . Migraine    hasnt had one in a long time  . Obesity, unspecified    Past Surgical History:  Procedure Laterality Date  . CHOLECYSTECTOMY  05/06/11  . COLONOSCOPY  11/2001   polyp; negative pathology  . COLONOSCOPY  09/2004   Negative  . COLONOSCOPY  2017 or 2018 patient  unsure   [facility] ;   . COLONOSCOPY W/ POLYPECTOMY    . CYSTOSCOPY N/A 01/03/2018   Procedure: CYSTOSCOPY;  Surgeon: Bobbye Charleston, MD;  Location: [facility];  Service: Gynecology;  Laterality: N/A;  . ESOPHAGOGASTRODUODENOSCOPY  1/10   normal  . FLEXIBLE SIGMOIDOSCOPY  03/05/2012   Procedure: FLEXIBLE SIGMOIDOSCOPY;  Surgeon: Inda Castle, MD;  [facility];  Service: Endoscopy;  Laterality: N/A;  . LAPAROSCOPIC DECORTICATION / DUBULKING / ABLATION RENAL CYSTS     saline histogram  . ROBOTIC ASSISTED LAPAROSCOPIC HYSTERECTOMY  AND SALPINGECTOMY Bilateral 01/03/2018   Procedure: XI ROBOTIC ASSISTED LAPAROSCOPIC HYSTERECTOMY AND SALPINGECTOMY;  Surgeon: Bobbye Charleston, MD;  Location: [facility];  Service: Gynecology;  Laterality: Bilateral;  WITH BED AFTER  . TONSILLECTOMY  07/30/07  . Uterine US  09/2005   Large endometrial stripe; small ovarian cyst   Social History   Tobacco Use  . Smoking status: Never Smoker  . Smokeless tobacco: Never Used  Substance Use Topics  . Alcohol use: No    Alcohol/week: 0.0 standard drinks  . Drug use: No   Family History  Problem Relation Age of Onset  . Heart disease Father   . Hypertension Father   . Heart attack Father   . Diabetes Father   . Ulcers Brother   . Clotting disorder Brother   .  Ovarian cancer Maternal Grandmother   . Colon polyps Maternal Grandmother   . Colon cancer Maternal Grandmother        40's  . Diabetes Paternal Grandmother   . Hypertension Paternal Grandmother   . Breast cancer Maternal Aunt        Age 40's  . Diabetes Paternal Aunt        x 5  . Lung cancer Maternal Grandfather        SMOKER   Allergies  Allergen Reactions  . Prevacid [Lansoprazole] Rash   Current Outpatient Medications on File Prior to Visit  Medication Sig Dispense Refill  . ibuprofen (ADVIL,MOTRIN) 600 MG tablet Take 1 tablet (600 mg total) by mouth every 8 (eight) hours as needed for fever, headache, mild pain or moderate pain. With food 45 tablet 1  . triamcinolone cream (KENALOG) 0.1 % Apply 1 application topically 2 (two) times daily as needed (for Eczema).     No current facility-administered medications on file prior to visit.      Review of Systems  Constitutional: Negative for activity change, appetite change, fatigue, fever and unexpected weight change.  HENT: Positive for congestion, postnasal drip, rhinorrhea and sneezing. Negative for ear pain, sinus pressure and sore throat.        Environmental allergies are acting up   Eyes: Negative for pain, redness and visual disturbance.  Respiratory: Negative for cough, shortness of breath and wheezing.   Cardiovascular: Negative for chest pain and palpitations.  Gastrointestinal: Negative for abdominal pain, blood in stool, constipation and diarrhea.  Endocrine: Negative for polydipsia and polyuria.  Genitourinary: Positive for vaginal discharge. Negative for dysuria, frequency and urgency.       Recent yeast vaginitis- still has to take 2nd diflucan  Musculoskeletal: Negative for arthralgias, back pain and myalgias.  Skin: Negative for pallor and rash.  Allergic/Immunologic: Negative for environmental allergies.  Neurological: Negative for dizziness, syncope and headaches.  Hematological: Negative for adenopathy.  Does not bruise/bleed easily.  Psychiatric/Behavioral: Negative for decreased concentration and dysphoric mood. The patient is not nervous/anxious.        Objective:   Physical Exam Constitutional:      General: She is not in acute distress.    Appearance: Normal appearance. She is well-developed. She is obese. She is not ill-appearing.  HENT:     Head: Normocephalic and atraumatic.     Right Ear: Tympanic membrane, ear canal and external ear normal.     Left Ear: Tympanic membrane, ear canal and external ear normal.     Ears:     Comments: Scant cerumen bilat    Nose:     Comments: Boggy nares  Mouth/Throat:     Mouth: Mucous membranes are moist.     Pharynx: Oropharynx is clear.  Eyes:     General: No scleral icterus.    Conjunctiva/sclera: Conjunctivae normal.     Pupils: Pupils are equal, round, and reactive to light.  Neck:     Musculoskeletal: Normal range of motion and neck supple.     Thyroid: No thyromegaly.     Vascular: No carotid bruit or JVD.  Cardiovascular:     Rate and Rhythm: Regular rhythm. Bradycardia present.     Heart sounds: Normal heart sounds. No murmur. No gallop.   Pulmonary:     Effort: Pulmonary effort is normal. No respiratory distress.     Breath sounds: Normal breath sounds. No wheezing or rales.  Chest:     Chest wall: No tenderness.  Abdominal:     General: Bowel sounds are normal. There is no distension or abdominal bruit.     Palpations: Abdomen is soft. There is no mass.     Tenderness: There is no abdominal tenderness.     Hernia: No hernia is present.  Genitourinary:    Comments: Breast exam: No mass, nodules, thickening, tenderness, bulging, retraction, inflamation, nipple discharge or skin changes noted.  No axillary or clavicular LA.    Dense tissue diffusely Musculoskeletal: Normal range of motion.        General: No tenderness.  Lymphadenopathy:     Cervical: No cervical adenopathy.  Skin:    General: Skin is warm and  dry.     Capillary Refill: Capillary refill takes less than 2 seconds.     Coloration: Skin is not pale.     Findings: No erythema or rash.  Neurological:     General: No focal deficit present.     Mental Status: She is alert.     Cranial Nerves: No cranial nerve deficit.     Motor: No abnormal muscle tone.     Coordination: Coordination normal.     Deep Tendon Reflexes: Reflexes are normal and symmetric.  Psychiatric:        Mood and Affect: Mood normal.           Assessment & Plan:   Problem List Items Addressed This Visit      Other   Morbid obesity ([place])    Discussed how this problem influences overall health and the risks it imposes  Reviewed plan for weight loss with lower calorie diet (via better food choices and also portion control or program like weight watchers) and exercise building up to or more than 30 minutes 5 days per week including some aerobic activity   Commended on good job so far       Routine general medical examination at a health care facility - Primary    Reviewed health habits including diet and exercise and skin cancer prevention Reviewed appropriate screening tests for age  Also reviewed health mt list, fam hx and immunization status , as well as social and family history   See HPI Labs ordered for wellness Commended on good health habits Nl breast exam -will plan to start mammograms at 38 utd colonoscopy        Relevant Orders   CBC with Differential/Platelet   Comprehensive metabolic panel   Lipid panel   TSH

## 2018-09-18 NOTE — Assessment & Plan Note (Signed)
Discussed how this problem influences overall health and the risks it imposes  Reviewed plan for weight loss with lower calorie diet (via better food choices and also portion control or program like weight watchers) and exercise building up to or more than 30 minutes 5 days per week including some aerobic activity   Commended on good job so far

## 2018-09-19 ENCOUNTER — Encounter: Payer: Self-pay | Admitting: Family Medicine

## 2018-09-19 LAB — COMPREHENSIVE METABOLIC PANEL
ALT: 11 U/L (ref 0–35)
AST: 13 U/L (ref 0–37)
Albumin: 4 g/dL (ref 3.5–5.2)
Alkaline Phosphatase: 72 U/L (ref 39–117)
BUN: 17 mg/dL (ref 6–23)
CO2: 22 mEq/L (ref 19–32)
Calcium: 9.3 mg/dL (ref 8.4–10.5)
Chloride: 106 mEq/L (ref 96–112)
Creatinine, Ser: 1.05 mg/dL (ref 0.40–1.20)
GFR: 70.41 mL/min (ref >60.00)
Glucose, Bld: 61 mg/dL — ABNORMAL LOW (ref 70–99)
Potassium: 4.3 mEq/L (ref 3.5–5.1)
Sodium: 140 mEq/L (ref 135–145)
TOTAL PROTEIN: 6.9 g/dL (ref 6.0–8.3)
Total Bilirubin: 0.2 mg/dL (ref 0.2–1.2)

## 2018-09-19 LAB — CBC WITH DIFFERENTIAL/PLATELET
Basophils Absolute: 0.1 10*3/uL (ref 0.0–0.1)
Basophils Relative: 1 % (ref 0.0–3.0)
EOS PCT: 3.3 % (ref 0.0–5.0)
Eosinophils Absolute: 0.3 10*3/uL (ref 0.0–0.7)
HCT: 39.9 % (ref 36.0–46.0)
HEMOGLOBIN: 12.9 g/dL (ref 12.0–15.0)
Lymphocytes Relative: 30.4 % (ref 12.0–46.0)
Lymphs Abs: 2.3 10*3/uL (ref 0.7–4.0)
MCHC: 32.3 g/dL (ref 30.0–36.0)
MCV: 82.7 fl (ref 78.0–100.0)
Monocytes Absolute: 0.7 10*3/uL (ref 0.1–1.0)
Monocytes Relative: 8.7 % (ref 3.0–12.0)
Neutro Abs: 4.3 10*3/uL (ref 1.4–7.7)
Neutrophils Relative %: 56.6 % (ref 43.0–77.0)
Platelets: 245 10*3/uL (ref 150.0–400.0)
RBC: 4.82 Mil/uL (ref 3.87–5.11)
RDW: 16.9 % — ABNORMAL HIGH (ref 11.5–15.5)
WBC: 7.6 10*3/uL (ref 4.0–10.5)

## 2018-09-19 LAB — LIPID PANEL
Cholesterol: 155 mg/dL (ref 0–200)
HDL: 33.7 mg/dL — ABNORMAL LOW (ref >39.00)
LDL CALC: 108 mg/dL — AB (ref 0–99)
NonHDL: 121.24
Total CHOL/HDL Ratio: 5
Triglycerides: 67 mg/dL (ref 0.0–149.0)
VLDL: 13.4 mg/dL (ref 0.0–40.0)

## 2018-09-19 LAB — TSH: TSH: 1.16 u[IU]/mL (ref 0.35–4.50)

## 2018-10-17 ENCOUNTER — Ambulatory Visit (INDEPENDENT_AMBULATORY_CARE_PROVIDER_SITE_OTHER)
Admission: RE | Admit: 2018-10-17 | Discharge: 2018-10-17 | Disposition: A | Discharge status: Home / Self Care | Payer: BC Managed Care – PPO | Source: Ambulatory Visit | Attending: Family Medicine | Admitting: Family Medicine

## 2018-10-17 ENCOUNTER — Encounter: Payer: Self-pay | Admitting: Family Medicine

## 2018-10-17 ENCOUNTER — Ambulatory Visit: Payer: BC Managed Care – PPO | Admitting: Family Medicine

## 2018-10-17 VITALS — BP 104/66 | HR 65 | Temp 98.0°F | Ht 66.5 in | Wt 265.4 lb

## 2018-10-17 DIAGNOSIS — J069 Acute upper respiratory infection, unspecified: Secondary | ICD-10-CM | POA: Diagnosis not present

## 2018-10-17 DIAGNOSIS — M25561 Pain in right knee: Secondary | ICD-10-CM | POA: Diagnosis not present

## 2018-10-17 DIAGNOSIS — B9789 Other viral agents as the cause of diseases classified elsewhere: Secondary | ICD-10-CM | POA: Diagnosis not present

## 2018-10-17 NOTE — Progress Notes (Signed)
Subjective:    Patient ID: Brittney [name], female    DOB: Mar 25, 1981, 38 y.o.   MRN: [id number]  HPI Here for c/o of R knee pain  Also uri symptoms /ear pain  Wt Readings from Last 3 Encounters:  10/17/18 265 lb 7 oz (120.4 kg)  09/18/18 266 lb (120.7 kg)  09/03/18 266 lb 8 oz (120.9 kg)   42.20 kg/m   Got in the tub one night- stepping up felt a pop  Hurts in the front and back  Tight feeling  Throbbing - and kept her up all night  Not swollen visually  No bruising   Has not injured this knee before  Feels a little unstable walking at times  Hurts more to extend than flex  Has not used ice  No pain medicines   Her L ear hurts (prone to ear infection)  Feels pressure  A little congestion  Achy but no fever    Patient Active Problem List   Diagnosis Date Noted  . Right knee pain 10/17/2018  . Viral URI with cough 10/17/2018  . Benign paroxysmal positional vertigo 05/16/2018  . Postoperative state 01/03/2018  . Lipoma of back 10/04/2017  . Routine general medical examination at a health care facility 08/29/2017  . Fibrocystic breast changes 01/08/2016  . Hemorrhoids, external 09/30/2014  . Chronic diarrhea 08/29/2014  . 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 Hemorrhoids, internal, with bleeding 04/22/2014  . Family history of pulmonary embolism 09/02/2013  . Family history of diabetes mellitus 09/02/2013  . Cutaneous skin tags 05/15/2013  . 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 Morbid obesity ([place]) 03/31/2008  . HEARING LOSS, MILD 03/31/2008  . MIGRAINE HEADACHE 02/20/2007  . IBS 02/20/2007  . OSTEOARTHRITIS 02/20/2007   Past Medical History:  Diagnosis Date  . Anemia   . Colon polyps   . Coronary artery abnormality    spasms   . Coronary artery spasm [facility])    reports saw cardiologist for chest pains , was told she was having coronary artery spasms , sent for cardio w/u with stress and echo  both unremarkable. reports today has not had any  spasms or chest pains in over 2 years    . Fibroids   . Gallstones   . Irritable bowel syndrome   . Migraine    hasnt had one in a long time  . Obesity, unspecified    Past Surgical History:  Procedure Laterality Date  . CHOLECYSTECTOMY  05/06/11  . COLONOSCOPY  11/2001   polyp; negative pathology  . COLONOSCOPY  09/2004   Negative  . COLONOSCOPY  2017 or 2018 patient  unsure   [facility] ;   . COLONOSCOPY W/ POLYPECTOMY    . CYSTOSCOPY N/A 01/03/2018   Procedure: CYSTOSCOPY;  Surgeon: Bobbye Charleston, MD;  Location: [facility];  Service: Gynecology;  Laterality: N/A;  . ESOPHAGOGASTRODUODENOSCOPY  1/10   normal  . FLEXIBLE SIGMOIDOSCOPY  03/05/2012   Procedure: FLEXIBLE SIGMOIDOSCOPY;  Surgeon: Inda Castle, MD;  Location: [facility];  Service: Endoscopy;  Laterality: N/A;  . LAPAROSCOPIC DECORTICATION / DUBULKING / ABLATION RENAL CYSTS     saline histogram  . ROBOTIC ASSISTED LAPAROSCOPIC HYSTERECTOMY AND SALPINGECTOMY Bilateral 01/03/2018   Procedure: XI ROBOTIC ASSISTED LAPAROSCOPIC HYSTERECTOMY AND SALPINGECTOMY;  Surgeon: Bobbye Charleston, MD;  Location: [facility];  Service: Gynecology;  Laterality: Bilateral;  WITH BED AFTER  . TONSILLECTOMY  07/30/07  . Uterine US  09/2005   Large endometrial stripe; small ovarian  cyst   Social History   Tobacco Use  . Smoking status: Never Smoker  . Smokeless tobacco: Never Used  Substance Use Topics  . Alcohol use: No    Alcohol/week: 0.0 standard drinks  . Drug use: No   Family History  Problem Relation Age of Onset  . Heart disease Father   . Hypertension Father   . Heart attack Father   . Diabetes Father   . Ulcers Brother   . Clotting disorder Brother   . Ovarian cancer Maternal Grandmother   . Colon polyps Maternal Grandmother   . Colon cancer Maternal Grandmother        40's  . Diabetes Paternal Grandmother   . Hypertension Paternal Grandmother   . Breast cancer Maternal Aunt        Age 63's  . Diabetes Paternal  Aunt        x 5  . Lung cancer Maternal Grandfather        SMOKER   Allergies  Allergen Reactions  . Prevacid [Lansoprazole] Rash   Current Outpatient Medications on File Prior to Visit  Medication Sig Dispense Refill  . fluticasone (FLONASE) 50 MCG/ACT nasal spray Place 2 sprays into both nostrils daily as needed (for allergies.). 48 g 3  . ibuprofen (ADVIL,MOTRIN) 600 MG tablet Take 1 tablet (600 mg total) by mouth every 8 (eight) hours as needed for fever, headache, mild pain or moderate pain. With food 45 tablet 1  . levocetirizine (XYZAL) 5 MG tablet Take 1 tablet (5 mg total) by mouth every evening. 90 tablet 3  . montelukast (SINGULAIR) 10 MG tablet Take 1 tablet (10 mg total) by mouth at bedtime. 90 tablet 3  . triamcinolone cream (KENALOG) 0.1 % Apply 1 application topically 2 (two) times daily as needed (for Eczema).     No current facility-administered medications on file prior to visit.     Review of Systems  Constitutional: Positive for appetite change and fatigue. Negative for fever.  HENT: Positive for congestion, postnasal drip, rhinorrhea, sinus pressure, sneezing and sore throat. Negative for ear pain.   Eyes: Negative for pain and discharge.  Respiratory: Positive for cough. Negative for shortness of breath, wheezing and stridor.   Cardiovascular: Negative for chest pain.  Gastrointestinal: Negative for diarrhea, nausea and vomiting.  Genitourinary: Negative for frequency, hematuria and urgency.  Musculoskeletal: Negative for arthralgias and myalgias.       Right knee pain and tightness   Skin: Negative for rash.  Neurological: Negative for dizziness, weakness, light-headedness and headaches.  Psychiatric/Behavioral: Negative for confusion and dysphoric mood.       Objective:   Physical Exam Constitutional:      General: She is not in acute distress.    Appearance: Normal appearance. She is well-developed. She is obese. She is not ill-appearing,  toxic-appearing or diaphoretic.  HENT:     Head: Normocephalic and atraumatic.     Comments: Nares are injected and congested      Right Ear: Tympanic membrane, ear canal and external ear normal.     Left Ear: Tympanic membrane, ear canal and external ear normal.     Ears:     Comments: TMs are dull  No erythema or bulging    Nose: Congestion and rhinorrhea present.     Mouth/Throat:     Mouth: Mucous membranes are moist.     Pharynx: Oropharynx is clear. No oropharyngeal exudate or posterior oropharyngeal erythema.     Comments: Clear pnd  Eyes:     General:        Right eye: No discharge.        Left eye: No discharge.     Conjunctiva/sclera: Conjunctivae normal.     Pupils: Pupils are equal, round, and reactive to light.  Neck:     Musculoskeletal: Normal range of motion and neck supple.  Cardiovascular:     Rate and Rhythm: Normal rate.     Heart sounds: Normal heart sounds.  Pulmonary:     Effort: Pulmonary effort is normal. No respiratory distress.     Breath sounds: Normal breath sounds. No stridor. No wheezing, rhonchi or rales.     Comments: Good air exch No rales or rhonchi Chest:     Chest wall: No tenderness.  Musculoskeletal:     Right knee: She exhibits decreased range of motion and swelling. She exhibits no ecchymosis, no deformity, no erythema, normal alignment, no LCL laxity, normal patellar mobility and no bony tenderness. Tenderness found. Lateral joint line and patellar tendon tenderness noted.     Right lower leg: No edema.     Left lower leg: No edema.     Comments: R knee Can flex to 45 deg with pain  Also pain on full extension and bounce  Pos mc murphy test  Neg ant drawer/lachman (knee is stable) Tender over anterior and lateral knee/ some posterior (unable to palpate a bakers cyst)  Mild swelling  Unable to tell if effusion   No neuro changes   Lymphadenopathy:     Cervical: No cervical adenopathy.  Skin:    General: Skin is warm and dry.      Capillary Refill: Capillary refill takes less than 2 seconds.     Findings: No rash.  Neurological:     Mental Status: She is alert.     Cranial Nerves: No cranial nerve deficit.     Motor: No weakness.     Gait: Gait normal.     Deep Tendon Reflexes: Reflexes normal.  Psychiatric:        Mood and Affect: Mood normal.           Assessment & Plan:   Problem List Items Addressed This Visit      Respiratory   Viral URI with cough    Reassuring exam  Some ETD w/o OM  inst to inc flonase to bid for a week  Disc symptomatic care - see instructions on AVS  Fluids/rest when possible         Other   Morbid obesity ([place])    Discussed how this problem influences overall health and the risks it imposes  Reviewed plan for weight loss with lower calorie diet (via better food choices and also portion control or program like weight watchers) and exercise building up to or more than 30 minutes 5 days per week including some aerobic activity         Right knee pain - Primary    With mild swelling  Has been going to the gym but felt a pop when stepping up  Tender over patellar tendon and laterally  Xray today pend rad review  inst to take her 600 mg ibuprofen as directed with food  Elevate and ice when able  Stop lower body workouts for now  Will update       Relevant Orders   DG Knee 4 Views W/Patella Right

## 2018-10-17 NOTE — Patient Instructions (Signed)
Take your ibuprofen as directed with food  Whenever possible - elevate leg and put ice on your knee   An elastic knee sleeve over the counter is fine if it feels good   Xray now  We will call you with a result tomorrow   I think you have a viral cold  Continue flonase (you can increase it to twice daily for a week)  For cough - I like mucinex DM  Lots of fluids   Update if not starting to improve in a week or if worsening

## 2018-10-17 NOTE — Assessment & Plan Note (Signed)
With mild swelling  Has been going to the gym but felt a pop when stepping up  Tender over patellar tendon and laterally  Xray today pend rad review  inst to take her 600 mg ibuprofen as directed with food  Elevate and ice when able  Stop lower body workouts for now  Will update

## 2018-10-17 NOTE — Assessment & Plan Note (Signed)
Discussed how this problem influences overall health and the risks it imposes  Reviewed plan for weight loss with lower calorie diet (via better food choices and also portion control or program like weight watchers) and exercise building up to or more than 30 minutes 5 days per week including some aerobic activity    

## 2018-10-17 NOTE — Assessment & Plan Note (Signed)
Reassuring exam  Some ETD w/o OM  inst to inc flonase to bid for a week  Disc symptomatic care - see instructions on AVS  Fluids/rest when possible

## 2018-10-27 ENCOUNTER — Other Ambulatory Visit: Payer: Self-pay | Admitting: Internal Medicine

## 2018-12-10 ENCOUNTER — Ambulatory Visit (INDEPENDENT_AMBULATORY_CARE_PROVIDER_SITE_OTHER): Payer: BC Managed Care – PPO | Admitting: Family Medicine

## 2018-12-10 ENCOUNTER — Encounter: Payer: Self-pay | Admitting: Family Medicine

## 2018-12-10 ENCOUNTER — Other Ambulatory Visit: Payer: Self-pay

## 2018-12-10 DIAGNOSIS — H6502 Acute serous otitis media, left ear: Secondary | ICD-10-CM

## 2018-12-10 MED ORDER — AMOXICILLIN-POT CLAVULANATE 400-57 MG/5ML PO SUSR: 10.0000 mL | Freq: Two times a day (BID) | ORAL | 0 refills | Status: DC

## 2018-12-10 MED ORDER — FLUCONAZOLE 150 MG PO TABS: 150.0000 mg | ORAL_TABLET | Freq: Once | ORAL | 0 refills | Status: AC

## 2018-12-10 NOTE — Assessment & Plan Note (Signed)
This is the ear often affected- this time no doubt brought on by allergies  Enc to continue flonase  tx with augmentin susp 800 mg bid for 7d (this tends to work well)  Diflucan px for yeast infection she often gets with abx  sympt tx- ibuprofen recommended with food as tol Update if not starting to improve in a week or if worsening

## 2018-12-10 NOTE — Progress Notes (Signed)
Virtual Visit via Video Note  I connected with Brittney [name] on 12/10/18 at 11:30 AM EDT by a video enabled telemedicine application and verified that I am speaking with the correct person using two identifiers. The patient is at home today  I am in my office at [place]  We attempted video visit but was unable to get screen /camera to work so we completed visit on the phone    I discussed the limitations of evaluation and management by telemedicine and the availability of in person appointments. The patient expressed understanding and agreed to proceed.  History of Present Illness: Pt presents with L ear pain   Thinks she has an ear infection  Has hurt since Saturday-now worse and not easing up  She has ear infections in the past  L side -dull pain /constant  No fever  Felt dizzy- a few times on and off with - positional change  No drainage from her ear  Feels full like it needs to pop  She has been using flonase and also allergy medicines   Has been outdoors more - allergens and pollen   No cough  Some runny nose from allergies / all mucous is clear No facial pain  pnd is significant - upsets her stomach Did vomit once   No known exp to covid   Has not taken pain med    Review of Systems  Constitutional: Negative for chills, diaphoresis, fever and malaise/fatigue.  HENT: Positive for congestion, ear pain and hearing loss. Negative for ear discharge, sinus pain and sore throat.   Eyes: Negative for blurred vision, discharge and redness.  Respiratory: Negative for cough, shortness of breath and stridor.   Cardiovascular: Negative for chest pain and palpitations.  Gastrointestinal: Positive for nausea. Negative for abdominal pain and diarrhea.  Musculoskeletal: Negative for myalgias.  Skin: Negative for itching and rash.  Neurological: Positive for dizziness. Negative for headaches.     Patient Active Problem List   Diagnosis Date Noted  . Right knee pain  10/17/2018  . Otitis media, left 05/16/2018  . Benign paroxysmal positional vertigo 05/16/2018  . Postoperative state 01/03/2018  . Lipoma of back 10/04/2017  . Routine general medical examination at a health care facility 08/29/2017  . Fibrocystic breast changes 01/08/2016  . Hemorrhoids, external 09/30/2014  . Chronic diarrhea 08/29/2014  . 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 Hemorrhoids, internal, with bleeding 04/22/2014  . Family history of pulmonary embolism 09/02/2013  . Family history of diabetes mellitus 09/02/2013  . Cutaneous skin tags 05/15/2013  . 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 Morbid obesity ([place]) 03/31/2008  . HEARING LOSS, MILD 03/31/2008  . MIGRAINE HEADACHE 02/20/2007  . IBS 02/20/2007  . OSTEOARTHRITIS 02/20/2007   Past Medical History:  Diagnosis Date  . Anemia   . Colon polyps   . Coronary artery abnormality    spasms   . Coronary artery spasm [facility])    reports saw cardiologist for chest pains , was told she was having coronary artery spasms , sent for cardio w/u with stress and echo  both unremarkable. reports today has not had any spasms or chest pains in over 2 years    . Fibroids   . Gallstones   . Irritable bowel syndrome   . Migraine    hasnt had one in a long time  . Obesity, unspecified    Past Surgical History:  Procedure Laterality Date  . CHOLECYSTECTOMY  05/06/11  . COLONOSCOPY  11/2001   polyp; negative pathology  . COLONOSCOPY  09/2004   Negative  . COLONOSCOPY  2017 or 2018 patient  unsure   [facility] ;   . COLONOSCOPY W/ POLYPECTOMY    . CYSTOSCOPY N/A 01/03/2018   Procedure: CYSTOSCOPY;  Surgeon: Bobbye Charleston, MD;  [facility];  Service: Gynecology;  Laterality: N/A;  . ESOPHAGOGASTRODUODENOSCOPY  1/10   normal  . FLEXIBLE SIGMOIDOSCOPY  03/05/2012   Procedure: FLEXIBLE SIGMOIDOSCOPY;  Surgeon: Inda Castle, MD;  [facility];  Service: Endoscopy;  Laterality: N/A;  . LAPAROSCOPIC DECORTICATION  / DUBULKING / ABLATION RENAL CYSTS     saline histogram  . ROBOTIC ASSISTED LAPAROSCOPIC HYSTERECTOMY AND SALPINGECTOMY Bilateral 01/03/2018   Procedure: XI ROBOTIC ASSISTED LAPAROSCOPIC HYSTERECTOMY AND SALPINGECTOMY;  Surgeon: Bobbye Charleston, MD;  Location: [facility];  Service: Gynecology;  Laterality: Bilateral;  WITH BED AFTER  . TONSILLECTOMY  07/30/07  . Uterine US  09/2005   Large endometrial stripe; small ovarian cyst   Social History   Tobacco Use  . Smoking status: Never Smoker  . Smokeless tobacco: Never Used  Substance Use Topics  . Alcohol use: No    Alcohol/week: 0.0 standard drinks  . Drug use: No   Family History  Problem Relation Age of Onset  . Heart disease Father   . Hypertension Father   . Heart attack Father   . Diabetes Father   . Ulcers Brother   . Clotting disorder Brother   . Ovarian cancer Maternal Grandmother   . Colon polyps Maternal Grandmother   . Colon cancer Maternal Grandmother        40's  . Diabetes Paternal Grandmother   . Hypertension Paternal Grandmother   . Breast cancer Maternal Aunt        Age 23's  . Diabetes Paternal Aunt        x 5  . Lung cancer Maternal Grandfather        SMOKER   Allergies  Allergen Reactions  . Prevacid [Lansoprazole] Rash   Current Outpatient Medications on File Prior to Visit  Medication Sig Dispense Refill  . fluticasone (FLONASE) 50 MCG/ACT nasal spray Place 2 sprays into both nostrils daily as needed (for allergies.). 48 g 3  . ibuprofen (ADVIL,MOTRIN) 600 MG tablet Take 1 tablet (600 mg total) by mouth every 8 (eight) hours as needed for fever, headache, mild pain or moderate pain. With food 45 tablet 1  . levocetirizine (XYZAL) 5 MG tablet Take 1 tablet (5 mg total) by mouth every evening. 90 tablet 3  . montelukast (SINGULAIR) 10 MG tablet Take 1 tablet (10 mg total) by mouth at bedtime. 90 tablet 3  . triamcinolone cream (KENALOG) 0.1 % Apply 1 application topically 2 (two) times daily as needed  (for Eczema).     No current facility-administered medications on file prior to visit.     Observations/Objective: Patient sounds like her usual self  Not distressed  Not hoarse and no slurred speech No sob or cough during interview She was able to hear without difficulty Normal /pleasant affect   Assessment and Plan: Problem List Items Addressed This Visit      Nervous and Auditory   Otitis media, left    This is the ear often affected- this time no doubt brought on by allergies  Enc to continue flonase  tx with augmentin susp 800 mg bid for 7d (this tends to work well)  Diflucan px for yeast infection she often gets  with abx  sympt tx- ibuprofen recommended with food as tol Update if not starting to improve in a week or if worsening        Relevant Medications   fluconazole (DIFLUCAN) 150 MG tablet   amoxicillin-clavulanate (AUGMENTIN) 400-57 MG/5ML suspension       Follow Up Instructions: Continue flonase for allergies/ear fullness Take augmentin as directed Diflucan if needed for a yeast infection  Fluids/rest Try ibuprofen as needed for pain (with food)  Watch for fever or cough  Update if not starting to improve in a week or if worsening      I discussed the assessment and treatment plan with the patient. The patient was provided an opportunity to ask questions and all were answered. The patient agreed with the plan and demonstrated an understanding of the instructions.   The patient was advised to call back or seek an in-person evaluation if the symptoms worsen or if the condition fails to improve as anticipated.    Loura Pardon, MD

## 2019-02-20 ENCOUNTER — Encounter: Payer: Self-pay | Admitting: Family Medicine

## 2019-02-20 ENCOUNTER — Ambulatory Visit (INDEPENDENT_AMBULATORY_CARE_PROVIDER_SITE_OTHER): Payer: BC Managed Care – PPO | Admitting: Family Medicine

## 2019-02-20 VITALS — Wt 266.0 lb

## 2019-02-20 DIAGNOSIS — R232 Flushing: Secondary | ICD-10-CM

## 2019-02-20 DIAGNOSIS — R5383 Other fatigue: Secondary | ICD-10-CM | POA: Insufficient documentation

## 2019-02-20 DIAGNOSIS — R5382 Chronic fatigue, unspecified: Secondary | ICD-10-CM

## 2019-02-20 DIAGNOSIS — K5900 Constipation, unspecified: Secondary | ICD-10-CM | POA: Insufficient documentation

## 2019-02-20 NOTE — Assessment & Plan Note (Signed)
With some hot flashes and also constipation  Lab today incl FSH , LH, TSH and B12 Suspect poss early menopause (in setting of partial hystectomy)

## 2019-02-20 NOTE — Progress Notes (Signed)
Virtual Visit via Video Note  I connected with Brittney [name] on 02/20/19 at  2:00 PM EDT by a video enabled telemedicine application and verified that I am speaking with the correct person using two identifiers.  Location: Patient: home Provider: office    I discussed the limitations of evaluation and management by telemedicine and the availability of in person appointments. The patient expressed understanding and agreed to proceed.  History of Present Illness: Pt presents with hot flashes   She wakes up at night sweating  Has to keep thermostat low  Less during the day  Less heat tolerant than she used to be  Sweats in her face and back  Had hot flashes right after hysterectomy  Then it stopped Recently started back   Exercise-beach body on demand/kettle bell/ a bunch of programs and bike  Drinks lots of water  Thinks she should urinate more based on what she drinks  Is often thirsty     38 yo pt  fam hx - mother had hysterectomy in 24s  Not sure what age she would have had menopause  GM died in 59s ? If any early menopause in family   Weight - is not changing Wt Readings from Last 3 Encounters:  10/17/18 265 lb 7 oz (120.4 kg)  09/18/18 266 lb (120.7 kg)  09/03/18 266 lb 8 oz (120.9 kg)  266 lb at home today   Eating low cal/ working out    Had hysterectomy /salpingectomy 5/19  For fibroids  Pap nl 2016  nl   No palpitations or rapid heart beat No bp problems  Has occasional headaches - from allergies  Mood is ok  (is a bit more irritable at times / tolerating less)  Some fatigue -all the time mostly  Sleeps well except for hot flashes   Last labs in feb  Lab Results  Component Value Date   CREATININE 1.05 09/18/2018   BUN 17 09/18/2018   NA 140 09/18/2018   K 4.3 09/18/2018   CL 106 09/18/2018   CO2 22 09/18/2018    Lab Results  Component Value Date   ALT 11 09/18/2018   AST 13 09/18/2018   ALKPHOS 72 09/18/2018   BILITOT 0.2  09/18/2018   Lab Results  Component Value Date   WBC 7.6 09/18/2018   HGB 12.9 09/18/2018   HCT 39.9 09/18/2018   MCV 82.7 09/18/2018   PLT 245.0 09/18/2018    Glucose was 61 Lab Results  Component Value Date   TSH 1.16 09/18/2018    No abd or pelvic pain  No longer diarrhea- more constipation now  Has to occ enema  Does eat veggies and fiber and lots of smoothies   Takes fiber supplement  Miralax once daily   Review of Systems  Constitutional: Positive for diaphoresis and malaise/fatigue. Negative for chills, fever and weight loss.  HENT: Negative for sore throat and tinnitus.   Eyes: Negative for blurred vision and discharge.  Respiratory: Negative for cough and shortness of breath.   Cardiovascular: Negative for chest pain, palpitations, claudication and leg swelling.  Gastrointestinal: Positive for constipation. Negative for diarrhea, heartburn, nausea and vomiting.  Genitourinary: Negative for dysuria.  Musculoskeletal: Negative for myalgias.  Skin: Negative for itching and rash.  Neurological: Negative for dizziness, tingling and tremors.  Endo/Heme/Allergies: Positive for polydipsia.    Patient Active Problem List   Diagnosis Date Noted  . Hot flashes 02/20/2019  . Constipation 02/20/2019  . Fatigue 02/20/2019  .  Right knee pain 10/17/2018  . Otitis media, left 05/16/2018  . Benign paroxysmal positional vertigo 05/16/2018  . Postoperative state 01/03/2018  . Lipoma of back 10/04/2017  . Routine general medical examination at a health care facility 08/29/2017  . Fibrocystic breast changes 01/08/2016  . Hemorrhoids, external 09/30/2014  . Chronic diarrhea 08/29/2014  . 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 Hemorrhoids, internal, with bleeding 04/22/2014  . Family history of pulmonary embolism 09/02/2013  . Family history of diabetes mellitus 09/02/2013  . Cutaneous skin tags 05/15/2013  . 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 Morbid obesity ([place])  03/31/2008  . HEARING LOSS, MILD 03/31/2008  . MIGRAINE HEADACHE 02/20/2007  . IBS 02/20/2007  . OSTEOARTHRITIS 02/20/2007   Past Medical History:  Diagnosis Date  . Anemia   . Colon polyps   . Coronary artery abnormality    spasms   . Coronary artery spasm [facility])    reports saw cardiologist for chest pains , was told she was having coronary artery spasms , sent for cardio w/u with stress and echo  both unremarkable. reports today has not had any spasms or chest pains in over 2 years    . Fibroids   . Gallstones   . Irritable bowel syndrome   . Migraine    hasnt had one in a long time  . Obesity, unspecified    Past Surgical History:  Procedure Laterality Date  . CHOLECYSTECTOMY  05/06/11  . COLONOSCOPY  11/2001   polyp; negative pathology  . COLONOSCOPY  09/2004   Negative  . COLONOSCOPY  2017 or 2018 patient  unsure   [facility] ;   . COLONOSCOPY W/ POLYPECTOMY    . CYSTOSCOPY N/A 01/03/2018   Procedure: CYSTOSCOPY;  Surgeon: Bobbye Charleston, MD;  Location: [facility];  Service: Gynecology;  Laterality: N/A;  . ESOPHAGOGASTRODUODENOSCOPY  1/10   normal  . FLEXIBLE SIGMOIDOSCOPY  03/05/2012   Procedure: FLEXIBLE SIGMOIDOSCOPY;  Surgeon: Inda Castle, MD;  Location: [facility];  Service: Endoscopy;  Laterality: N/A;  . LAPAROSCOPIC DECORTICATION / DUBULKING / ABLATION RENAL CYSTS     saline histogram  . ROBOTIC ASSISTED LAPAROSCOPIC HYSTERECTOMY AND SALPINGECTOMY Bilateral 01/03/2018   Procedure: XI ROBOTIC ASSISTED LAPAROSCOPIC HYSTERECTOMY AND SALPINGECTOMY;  Surgeon: Bobbye Charleston, MD;  Location: [facility];  Service: Gynecology;  Laterality: Bilateral;  WITH BED AFTER  . TONSILLECTOMY  07/30/07  . Uterine US  09/2005   Large endometrial stripe; small ovarian cyst   Social History   Tobacco Use  . Smoking status: Never Smoker  . Smokeless tobacco: Never Used  Substance Use Topics  . Alcohol use: No    Alcohol/week: 0.0 standard drinks  . Drug use: No   Family  History  Problem Relation Age of Onset  . Heart disease Father   . Hypertension Father   . Heart attack Father   . Diabetes Father   . Ulcers Brother   . Clotting disorder Brother   . Ovarian cancer Maternal Grandmother   . Colon polyps Maternal Grandmother   . Colon cancer Maternal Grandmother        40's  . Diabetes Paternal Grandmother   . Hypertension Paternal Grandmother   . Breast cancer Maternal Aunt        Age 26's  . Diabetes Paternal Aunt        x 5  . Lung cancer Maternal Grandfather        SMOKER   Allergies  Allergen Reactions  . Prevacid [  Lansoprazole] Rash   Current Outpatient Medications on File Prior to Visit  Medication Sig Dispense Refill  . fluticasone (FLONASE) 50 MCG/ACT nasal spray Place 2 sprays into both nostrils daily as needed (for allergies.). 48 g 3  . ibuprofen (ADVIL,MOTRIN) 600 MG tablet Take 1 tablet (600 mg total) by mouth every 8 (eight) hours as needed for fever, headache, mild pain or moderate pain. With food 45 tablet 1  . levocetirizine (XYZAL) 5 MG tablet Take 1 tablet (5 mg total) by mouth every evening. 90 tablet 3  . montelukast (SINGULAIR) 10 MG tablet Take 1 tablet (10 mg total) by mouth at bedtime. 90 tablet 3  . triamcinolone cream (KENALOG) 0.1 % Apply 1 application topically 2 (two) times daily as needed (for Eczema).     No current facility-administered medications on file prior to visit.     Observations/Objective: Patient appears well, in no distress Weight is baseline (obese) No facial swelling or asymmetry Normal voice-not hoarse and no slurred speech No obvious tremor or mobility impairment Moving neck and UEs normally Able to hear the call well  No cough or shortness of breath during interview  Talkative and mentally sharp with no cognitive changes No skin changes on face or neck , no rash or pallor Affect is normal    Assessment and Plan: Problem List Items Addressed This Visit      Cardiovascular and  Mediastinum   Hot flashes - Primary    Suspect poss early menopause  Also with some mood change and difficulty loosing weight  Labs planned for [facility], LH and also TSH and some labs for fatigue       Relevant Orders   CBC with Differential/Platelet   Hemoglobin A1c   Comprehensive metabolic panel   TSH   Follicle Stimulating Hormone   Luteinizing hormone     Other   Morbid obesity ([place])    Pt frustrated over lack of progress with wt loss   ? If poss early menopause Discussed how this problem influences overall health and the risks it imposes  Reviewed plan for weight loss with lower calorie diet (via better food choices and also portion control or program like weight watchers) and exercise building up to or more than 30 minutes 5 days per week including some aerobic activity    Pending labs -[facility] ? If she would consider bariatric surgery      Constipation    In pt who usually has diarrhea   inst to stop fiber supplement Continue fluids and fruit/veg  Increase miralax to three times daily until regular bms  TSH with lab today      Relevant Orders   Comprehensive metabolic panel   TSH   Fatigue    With some hot flashes and also constipation  Lab today incl FSH , LH, TSH and B12 Suspect poss early menopause (in setting of partial hystectomy)      Relevant Orders   CBC with Differential/Platelet   TSH   Vitamin B12   VITAMIN D 25 Hydroxy (Vit-D Deficiency, Fractures)   Follicle Stimulating Hormone   Luteinizing hormone       Follow Up Instructions: Continue good fluid intake Also fruit/veg Stop fiber supplement   Continue exercise   The office will call you to schedule labs for hot flashes and fatigue   If you are in menopause -this could explain difficulty loosing weight  Gabapentin is a medicine to consider if needed  I discussed the assessment and treatment plan with the patient. The patient was provided an  opportunity to ask questions and all were answered. The patient agreed with the plan and demonstrated an understanding of the instructions.   The patient was advised to call back or seek an in-person evaluation if the symptoms worsen or if the condition fails to improve as anticipated.     Loura Pardon, MD

## 2019-02-20 NOTE — Assessment & Plan Note (Signed)
Pt frustrated over lack of progress with wt loss   ? If poss early menopause Discussed how this problem influences overall health and the risks it imposes  Reviewed plan for weight loss with lower calorie diet (via better food choices and also portion control or program like weight watchers) and exercise building up to or more than 30 minutes 5 days per week including some aerobic activity    Pending labs -[facility] ? If she would consider bariatric surgery

## 2019-02-20 NOTE — Assessment & Plan Note (Signed)
Suspect poss early menopause  Also with some mood change and difficulty loosing weight  Labs planned for [facility], LH and also TSH and some labs for fatigue

## 2019-02-20 NOTE — Assessment & Plan Note (Signed)
In pt who usually has diarrhea   inst to stop fiber supplement Continue fluids and fruit/veg  Increase miralax to three times daily until regular bms  TSH with lab today

## 2019-02-20 NOTE — Patient Instructions (Signed)
Continue good fluid intake Also fruit/veg Stop fiber supplement   Continue exercise   The office will call you to schedule labs for hot flashes and fatigue   If you are in menopause -this could explain difficulty loosing weight  Gabapentin is a medicine to consider if needed

## 2019-02-21 ENCOUNTER — Other Ambulatory Visit: Payer: BC Managed Care – PPO

## 2019-02-21 ENCOUNTER — Other Ambulatory Visit: Payer: Self-pay

## 2019-02-21 ENCOUNTER — Other Ambulatory Visit (INDEPENDENT_AMBULATORY_CARE_PROVIDER_SITE_OTHER): Payer: BC Managed Care – PPO

## 2019-02-21 ENCOUNTER — Encounter: Payer: Self-pay | Admitting: Family Medicine

## 2019-02-21 DIAGNOSIS — R5382 Chronic fatigue, unspecified: Secondary | ICD-10-CM | POA: Diagnosis not present

## 2019-02-21 DIAGNOSIS — R232 Flushing: Secondary | ICD-10-CM | POA: Diagnosis not present

## 2019-02-21 DIAGNOSIS — K5900 Constipation, unspecified: Secondary | ICD-10-CM

## 2019-02-21 LAB — CBC WITH DIFFERENTIAL/PLATELET
Basophils Absolute: 0.1 10*3/uL (ref 0.0–0.1)
Basophils Relative: 0.6 % (ref 0.0–3.0)
Eosinophils Absolute: 0.1 10*3/uL (ref 0.0–0.7)
Eosinophils Relative: 1.8 % (ref 0.0–5.0)
HCT: 42.8 % (ref 36.0–46.0)
Hemoglobin: 13.6 g/dL (ref 12.0–15.0)
Lymphocytes Relative: 32 % (ref 12.0–46.0)
Lymphs Abs: 2.6 10*3/uL (ref 0.7–4.0)
MCHC: 31.8 g/dL (ref 30.0–36.0)
MCV: 84.1 fl (ref 78.0–100.0)
Monocytes Absolute: 0.5 10*3/uL (ref 0.1–1.0)
Monocytes Relative: 6.4 % (ref 3.0–12.0)
Neutro Abs: 4.8 10*3/uL (ref 1.4–7.7)
Neutrophils Relative %: 59.2 % (ref 43.0–77.0)
Platelets: 272 10*3/uL (ref 150.0–400.0)
RBC: 5.1 Mil/uL (ref 3.87–5.11)
RDW: 16 % — ABNORMAL HIGH (ref 11.5–15.5)
WBC: 8.2 10*3/uL (ref 4.0–10.5)

## 2019-02-21 LAB — VITAMIN D 25 HYDROXY (VIT D DEFICIENCY, FRACTURES): VITD: 16.62 ng/mL — ABNORMAL LOW (ref 30.00–100.00)

## 2019-02-21 LAB — COMPREHENSIVE METABOLIC PANEL WITH GFR
ALT: 14 U/L (ref 0–35)
AST: 14 U/L (ref 0–37)
Albumin: 4 g/dL (ref 3.5–5.2)
Alkaline Phosphatase: 71 U/L (ref 39–117)
BUN: 12 mg/dL (ref 6–23)
CO2: 27 meq/L (ref 19–32)
Calcium: 9 mg/dL (ref 8.4–10.5)
Chloride: 104 meq/L (ref 96–112)
Creatinine, Ser: 1.03 mg/dL (ref 0.40–1.20)
GFR: 71.84 mL/min
Glucose, Bld: 92 mg/dL (ref 70–99)
Potassium: 4.4 meq/L (ref 3.5–5.1)
Sodium: 138 meq/L (ref 135–145)
Total Bilirubin: 0.5 mg/dL (ref 0.2–1.2)
Total Protein: 6.7 g/dL (ref 6.0–8.3)

## 2019-02-21 LAB — FOLLICLE STIMULATING HORMONE: FSH: 5.9 m[IU]/mL

## 2019-02-21 LAB — HEMOGLOBIN A1C: Hgb A1c MFr Bld: 5.8 % (ref 4.6–6.5)

## 2019-02-21 LAB — VITAMIN B12: Vitamin B-12: 299 pg/mL (ref 211–911)

## 2019-02-21 LAB — TSH: TSH: 2.44 u[IU]/mL (ref 0.35–4.50)

## 2019-02-21 LAB — LUTEINIZING HORMONE: LH: 5.77 m[IU]/mL

## 2019-02-22 LAB — HEMOGLOBIN A1C
Hgb A1c MFr Bld: 4.4 % of total Hgb (ref <5.7)
Mean Plasma Glucose: 80 (calc)
eAG (mmol/L): 4.4 (calc)

## 2019-03-07 ENCOUNTER — Encounter (INDEPENDENT_AMBULATORY_CARE_PROVIDER_SITE_OTHER): Payer: Self-pay | Admitting: Family Medicine

## 2019-03-07 ENCOUNTER — Ambulatory Visit (INDEPENDENT_AMBULATORY_CARE_PROVIDER_SITE_OTHER): Payer: BC Managed Care – PPO | Admitting: Family Medicine

## 2019-03-07 ENCOUNTER — Other Ambulatory Visit: Payer: Self-pay

## 2019-03-07 VITALS — BP 125/84 | HR 61 | Temp 97.8°F | Ht 67.0 in | Wt 266.0 lb

## 2019-03-07 DIAGNOSIS — E559 Vitamin D deficiency, unspecified: Secondary | ICD-10-CM

## 2019-03-07 DIAGNOSIS — E66813 Obesity, class 3: Secondary | ICD-10-CM

## 2019-03-07 DIAGNOSIS — Z6841 Body Mass Index (BMI) 40.0 and over, adult: Secondary | ICD-10-CM

## 2019-03-07 DIAGNOSIS — Z1331 Encounter for screening for depression: Secondary | ICD-10-CM | POA: Diagnosis not present

## 2019-03-07 DIAGNOSIS — Z9189 Other specified personal risk factors, not elsewhere classified: Secondary | ICD-10-CM

## 2019-03-07 DIAGNOSIS — R5383 Other fatigue: Secondary | ICD-10-CM | POA: Diagnosis not present

## 2019-03-07 DIAGNOSIS — R0602 Shortness of breath: Secondary | ICD-10-CM

## 2019-03-08 LAB — LIPID PANEL WITH LDL/HDL RATIO
Cholesterol, Total: 176 mg/dL (ref 100–199)
HDL: 46 mg/dL (ref >39)
LDL Calculated: 116 mg/dL — ABNORMAL HIGH (ref 0–99)
LDl/HDL Ratio: 2.5 ratio (ref 0.0–3.2)
Triglycerides: 68 mg/dL (ref 0–149)
VLDL Cholesterol Cal: 14 mg/dL (ref 5–40)

## 2019-03-08 LAB — T4, FREE: Free T4: 1.12 ng/dL (ref 0.82–1.77)

## 2019-03-08 LAB — T3: T3, Total: 128 ng/dL (ref 71–180)

## 2019-03-08 LAB — INSULIN, RANDOM: INSULIN: 10.8 u[IU]/mL (ref 2.6–24.9)

## 2019-03-08 LAB — FOLATE: Folate: 6.7 ng/mL (ref >3.0)

## 2019-03-11 NOTE — Progress Notes (Signed)
.  Office: [phone]  /  Fax: [phone]   HPI:   Chief Complaint: OBESITY  Brittney [name] (MR# [id number]) is a 38 y.o. female who presents on 03/07/2019 for obesity evaluation and treatment. Current BMI is Body mass index is 41.66 kg/m. Brittney [name] has struggled with obesity for years and has been unsuccessful in either losing weight or maintaining long term weight loss. Brittney [name] attended our information session and states she is currently in the action stage of change and ready to dedicate time achieving and maintaining a healthier weight.   Brittney [name] found out about our clinic from a doctor. For breakfast, she is doing 1 cup of oatmeal, sugar, vanilla, and cinnamon, and 2 egg whites (feels full). For snack, she is doing granola bar or Belvita, or smoothies (full until lunch). For lunch, she is doing 3 oz baked chicken or fish, 1/2 cup of sweet potato with cinnamon, and 1 cup of broccoli or green beans with butter. For dinner, she is doing the same as lunch and same portion, and no snack after dinner.  Brittney [name] states her family eats meals together she thinks her family will eat healthier with  her her desired weight loss is 81 lbs she started gaining weight in 2010 her heaviest weight ever was 291 lbs she skips meals frequently she is frequently drinking liquids with calories she struggles with emotional eating    Fatigue Brittney [name] feels her energy is lower than it should be. This has worsened with weight gain and has not worsened recently. Brittney [name] admits to daytime somnolence and  denies waking up still tired. Patient is at risk for obstructive sleep apnea. Patent has a history of symptoms of daytime fatigue. Patient generally gets 8 hours of sleep per night, and states they generally have generally restful sleep. Snoring is present. Apneic episodes are not present. Epworth Sleepiness Score is 6.  Dyspnea on exertion Brittney [name] notes increasing shortness of breath with exercising and seems to be worsening over time  with weight gain. She notes getting out of breath sooner with activity than she used to. This has not gotten worse recently. EKG-normal sinus rhythm at 64 BPM. Brittney [name] denies orthopnea.  Vitamin D Deficiency Brittney [name] has a diagnosis of vitamin D deficiency. She is currently taking OTC Vit D. Last Vit D level was of 16.62 on 02/21/2019. She notes fatigue and denies nausea, vomiting or muscle weakness.  At risk for osteopenia and osteoporosis Brittney [name] is at higher risk of osteopenia and osteoporosis due to vitamin D deficiency.   Depression Screen Brittney [name]'s Food and Mood (modified PHQ-9) score was  Depression screen PHQ 2/9 03/07/2019  Decreased Interest 2  Down, Depressed, Hopeless 2  PHQ - 2 Score 4  Altered sleeping 0  Tired, decreased energy 2  Change in appetite 2  Feeling bad or failure about yourself  3  Trouble concentrating 3  Moving slowly or fidgety/restless 1  Suicidal thoughts 0  PHQ-9 Score 15  Difficult doing work/chores Not difficult at all    ASSESSMENT AND PLAN:  Other fatigue - Plan: EKG 12-Lead, Insulin, random, Folate, T3, T4, free  Shortness of breath on exertion - Plan: Lipid Panel With LDL/HDL Ratio  Vitamin D deficiency  Depression screening  At risk for osteoporosis  Class 3 severe obesity with serious comorbidity and body mass index (BMI) of 40.0 to 44.9 in adult, unspecified obesity type (HCC)  PLAN:  Fatigue Brittney [name] was informed that her fatigue may be related to obesity, depression or many other causes.  Labs will be ordered, and in the meanwhile Brittney [name] has agreed to work on diet, exercise and weight loss to help with fatigue. Proper sleep hygiene was discussed including the need for 7-8 hours of quality sleep each night. A sleep study was not ordered based on symptoms and Epworth score.  Dyspnea on exertion Brittney [name]'s shortness of breath appears to be obesity related and exercise induced. She has agreed to work on weight loss and gradually increase exercise to treat her  exercise induced shortness of breath. If Brittney [name] follows our instructions and loses weight without improvement of her shortness of breath, we will plan to refer to pulmonology. We will monitor this condition regularly. Brittney [name] agrees to this plan.  Vitamin D Deficiency Brittney [name] was informed that low vitamin D levels contributes to fatigue and are associated with obesity, breast, and colon cancer. Brittney [name] agrees to continue taking OTC Vit D and will follow up for routine testing of vitamin D, at least 2-3 times per year. She was informed of the risk of over-replacement of vitamin D and agrees to not increase her dose unless she discusses this with [place] first. Brittney [name] agrees to follow up with our clinic in 2 weeks.  At risk for osteopenia and osteoporosis Brittney [name] was given extended (15 minutes) osteoporosis prevention counseling today. Brittney [name] is at risk for osteopenia and osteoporsis due to her vitamin D deficiency. She was encouraged to take her vitamin D and follow her higher calcium diet and increase strengthening exercise to help strengthen her bones and decrease her risk of osteopenia and osteoporosis.  Depression Screen Brittney [name] had a strongly positive depression screening. Depression is commonly associated with obesity and often results in emotional eating behaviors. We will monitor this closely and work on CBT to help improve the non-hunger eating patterns. Referral to Psychology may be required if no improvement is seen as she continues in our clinic.  Obesity Brittney [name] is currently in the action stage of change and her goal is to continue with weight loss efforts She has agreed to follow the Category 3 plan Brittney [name] has been instructed to work up to a goal of 150 minutes of combined cardio and strengthening exercise per week for weight loss and overall health benefits. We discussed the following Behavioral Modification Strategies today: increasing lean protein intake, increasing vegetables and work on meal planning and easy cooking  plans, keeping healthy foods in the home, better snacking choices, and planning for success  Brittney [name] has agreed to follow up with our clinic in 2 weeks. She was informed of the importance of frequent follow up visits to maximize her success with intensive lifestyle modifications for her multiple health conditions. She was informed we would discuss her lab results at her next visit unless there is a critical issue that needs to be addressed sooner. Brittney [name] agreed to keep her next visit at the agreed upon time to discuss these results.  ALLERGIES: Allergies  Allergen Reactions  . Prevacid [Lansoprazole] Rash    MEDICATIONS: Current Outpatient Medications on File Prior to Visit  Medication Sig Dispense Refill  . Cholecalciferol (VITAMIN D3) 125 MCG (5000 UT) CAPS Take 5,000 Units by mouth.    . fluticasone (FLONASE) 50 MCG/ACT nasal spray Place 2 sprays into both nostrils daily as needed (for allergies.). 48 g 3  . levocetirizine (XYZAL) 5 MG tablet Take 1 tablet (5 mg total) by mouth every evening. 90 tablet 3  . montelukast (SINGULAIR) 10 MG tablet Take 1 tablet (10 mg total) by mouth at bedtime.  90 tablet 3  . triamcinolone cream (KENALOG) 0.1 % Apply 1 application topically 2 (two) times daily as needed (for Eczema).    . vitamin B-12 (CYANOCOBALAMIN) 1000 MCG tablet Take 1,000 mcg by mouth daily.    Marland Kitchen ibuprofen (ADVIL,MOTRIN) 600 MG tablet Take 1 tablet (600 mg total) by mouth every 8 (eight) hours as needed for fever, headache, mild pain or moderate pain. With food (Patient not taking: Reported on 03/07/2019) 45 tablet 1   No current facility-administered medications on file prior to visit.     PAST MEDICAL HISTORY: Past Medical History:  Diagnosis Date  . Anemia   . B12 deficiency   . Back pain   . Colon polyps   . Coronary artery abnormality    spasms   . 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 reports today has not had any spasms or chest pains in over 2 years    . Fibroids   . Gallstones   . Irritable bowel syndrome   . Joint pain   . Lactose intolerance   . Migraine    hasnt had one in a long time  . Obesity, unspecified   . Vitamin D deficiency     PAST SURGICAL HISTORY: Past Surgical History:  Procedure Laterality Date  . CHOLECYSTECTOMY  05/06/11  . COLONOSCOPY  11/2001   polyp; negative pathology  . COLONOSCOPY  09/2004   Negative  . COLONOSCOPY  2017 or 2018 patient  unsure   [facility] ;   . COLONOSCOPY W/ POLYPECTOMY    . CYSTOSCOPY N/A 01/03/2018   Procedure: CYSTOSCOPY;  Surgeon: Bobbye Charleston, MD;  Location: [facility];  Service: Gynecology;  Laterality: N/A;  . ESOPHAGOGASTRODUODENOSCOPY  1/10   normal  . FLEXIBLE SIGMOIDOSCOPY  03/05/2012   Procedure: FLEXIBLE SIGMOIDOSCOPY;  Surgeon: Inda Castle, MD;  Location: [facility];  Service: Endoscopy;  Laterality: N/A;  . LAPAROSCOPIC DECORTICATION / DUBULKING / ABLATION RENAL CYSTS     saline histogram  . ROBOTIC ASSISTED LAPAROSCOPIC HYSTERECTOMY AND SALPINGECTOMY Bilateral 01/03/2018   Procedure: XI ROBOTIC ASSISTED LAPAROSCOPIC HYSTERECTOMY AND SALPINGECTOMY;  Surgeon: Bobbye Charleston, MD;  Location: [facility];  Service: Gynecology;  Laterality: Bilateral;  WITH BED AFTER  . TONSILLECTOMY  07/30/07  . Uterine US  09/2005   Large endometrial stripe; small ovarian cyst    SOCIAL HISTORY: Social History   Tobacco Use  . Smoking status: Never Smoker  . Smokeless tobacco: Never Used  Substance Use Topics  . Alcohol use: No    Alcohol/week: 0.0 standard drinks  . Drug use: No    FAMILY HISTORY: Family History  Problem Relation Age of Onset  . Heart disease Father   . Hypertension Father   . Heart attack Father   . Diabetes Father   . Sleep apnea Mother   . Obesity Mother   . Ulcers Brother   . Clotting disorder Brother   . Ovarian cancer  Maternal Grandmother   . Colon polyps Maternal Grandmother   . Colon cancer Maternal Grandmother        40's  . Diabetes Paternal Grandmother   . Hypertension Paternal Grandmother   . Breast cancer Maternal Aunt        Age 5's  . Diabetes Paternal Aunt        x 5  . Lung  cancer Maternal Grandfather        SMOKER    ROS: Review of Systems  Constitutional: Positive for malaise/fatigue. Negative for weight loss.  Eyes:       + Wear glasses or contacts  Respiratory: Positive for shortness of breath (with exertion).   Cardiovascular: Negative for orthopnea.  Gastrointestinal: Positive for constipation and diarrhea. Negative for nausea and vomiting.  Musculoskeletal: Positive for back pain.       Negative muscle weakness    PHYSICAL EXAM: Blood pressure 125/84, pulse 61, temperature 97.8 F (36.6 C), temperature source Oral, height 5\' 7"  (1.702 m), weight 266 lb (120.7 kg), last menstrual period 12/21/2017, SpO2 100 %. Body mass index is 41.66 kg/m. Physical Exam Vitals signs reviewed.  Constitutional:      Appearance: Normal appearance. She is obese.  HENT:     Head: Normocephalic and atraumatic.     Nose: Nose normal.  Eyes:     General: No scleral icterus.    Extraocular Movements: Extraocular movements intact.  Neck:     Musculoskeletal: Normal range of motion and neck supple.     Comments: No thyromegaly present Cardiovascular:     Rate and Rhythm: Normal rate and regular rhythm.     Pulses: Normal pulses.     Heart sounds: Normal heart sounds.  Pulmonary:     Effort: Pulmonary effort is normal. No respiratory distress.     Breath sounds: Normal breath sounds.  Abdominal:     Palpations: Abdomen is soft.     Tenderness: There is no abdominal tenderness.     Comments: + Obesity  Musculoskeletal: Normal range of motion.     Right lower leg: No edema.     Left lower leg: No edema.  Skin:    General: Skin is warm and dry.  Neurological:     Mental Status: She  is alert and oriented to person, place, and time.     Coordination: Coordination normal.  Psychiatric:        Mood and Affect: Mood normal.        Behavior: Behavior normal.     RECENT LABS AND TESTS: BMET    Component Value Date/Time   NA 138 02/21/2019 0752   NA 140 07/02/2014 1253   K 4.4 02/21/2019 0752   K 4.2 07/02/2014 1253   CL 104 02/21/2019 0752   CL 107 07/02/2014 1253   CO2 27 02/21/2019 0752   CO2 31 07/02/2014 1253   GLUCOSE 92 02/21/2019 0752   GLUCOSE 89 07/02/2014 1253   BUN 12 02/21/2019 0752   BUN 10 07/02/2014 1253   CREATININE 1.03 02/21/2019 0752   CREATININE 1.07 07/02/2014 1253   CREATININE 0.82 09/02/2013 1250   CALCIUM 9.0 02/21/2019 0752   CALCIUM 8.7 07/02/2014 1253   GFRNONAA >60 09/16/2016 1937   GFRNONAA >60 07/02/2014 1253   GFRNONAA >60 07/05/2013 1025   GFRAA >60 09/16/2016 1937   GFRAA >60 07/02/2014 1253   GFRAA >60 07/05/2013 1025   Lab Results  Component Value Date   HGBA1C 4.4 02/21/2019   Lab Results  Component Value Date   INSULIN 10.8 03/07/2019   CBC    Component Value Date/Time   WBC 8.2 02/21/2019 0752   RBC 5.10 02/21/2019 0752   HGB 13.6 02/21/2019 0752   HGB 11.4 (L) 07/02/2014 1253   HCT 42.8 02/21/2019 0752   HCT 36.1 07/02/2014 1253   PLT 272.0 02/21/2019 0752   PLT 260 07/02/2014 1253  MCV 84.1 02/21/2019 0752   MCV 81 07/02/2014 1253   MCH 24.6 (L) 12/22/2017 0850   MCHC 31.8 02/21/2019 0752   RDW 16.0 (H) 02/21/2019 0752   RDW 16.9 (H) 07/02/2014 1253   LYMPHSABS 2.6 02/21/2019 0752   LYMPHSABS 1.3 02/19/2012 0455   MONOABS 0.5 02/21/2019 0752   MONOABS 0.5 02/19/2012 0455   EOSABS 0.1 02/21/2019 0752   EOSABS 0.1 02/19/2012 0455   BASOSABS 0.1 02/21/2019 0752   BASOSABS 0.0 02/19/2012 0455   Iron/TIBC/Ferritin/ %Sat    Component Value Date/Time   IRON 152 (H) 09/20/2016 1428   FERRITIN 6.7 (L) 09/20/2016 1428   IRONPCTSAT 32.6 09/20/2016 1428   Lipid Panel     Component Value  Date/Time   CHOL 176 03/07/2019 0957   TRIG 68 03/07/2019 0957   HDL 46 03/07/2019 0957   CHOLHDL 5 09/18/2018 1624   VLDL 13.4 09/18/2018 1624   LDLCALC 116 (H) 03/07/2019 0957   Hepatic Function Panel     Component Value Date/Time   PROT 6.7 02/21/2019 0752   PROT 7.0 07/02/2014 1253   ALBUMIN 4.0 02/21/2019 0752   ALBUMIN 3.5 07/02/2014 1253   AST 14 02/21/2019 0752   AST 26 07/02/2014 1253   ALT 14 02/21/2019 0752   ALT 25 07/02/2014 1253   ALKPHOS 71 02/21/2019 0752   ALKPHOS 93 07/02/2014 1253   BILITOT 0.5 02/21/2019 0752   BILITOT 0.4 07/02/2014 1253   BILIDIR 0.1 07/23/2008 1707      Component Value Date/Time   TSH 2.44 02/21/2019 0752   Vitamin D 16.62 on 02/21/2019  ECG  shows NSR with a rate of 64 BPM INDIRECT CALORIMETER done today shows a VO2 of 260 and a REE of 1811. Her calculated basal metabolic rate is 3007 thus her basal metabolic rate is worse than expected.       OBESITY BEHAVIORAL INTERVENTION VISIT  Today's visit was # 1   Starting weight: 266 lbs Starting date: 03/07/2019 Today's weight : 266 lbs Today's date: 03/07/2019 Total lbs lost to date: 0    ASK: We discussed the diagnosis of obesity with [name] Gilliam-Wilson today and Margel agreed to give [place] permission to discuss obesity behavioral modification therapy today.  ASSESS: Marcile has the diagnosis of obesity and her BMI today is 41.65 Jailey is in the action stage of change   ADVISE: Ibtisam was educated on the multiple health risks of obesity as well as the benefit of weight loss to improve her health. She was advised of the need for long term treatment and the importance of lifestyle modifications to improve her current health and to decrease her risk of future health problems.  AGREE: Multiple dietary modification options and treatment options were discussed and  Brittney [name] agreed to follow the recommendations documented in the above note.  ARRANGE: Brittney [name] was educated on the importance of  frequent visits to treat obesity as outlined per CMS and USPSTF guidelines and agreed to schedule her next follow up appointment today.   I, Trixie Dredge, am acting as transcriptionist for Ilene Qua, MD    I have reviewed the above documentation for accuracy and completeness, and I agree with the above. - Ilene Qua, MD

## 2019-03-12 NOTE — Progress Notes (Signed)
Office: [phone]  /  Fax: [phone]    Date: March 14, 2019   Appointment Start Time: 3:01pm Duration: 23 minutes Provider: Glennie Isle, Psy.D. Type of Session: Intake for Individual Therapy  Location of Patient: Home Location of Provider: [facility] Type of Contact: Telepsychological Visit via [employer] WebEx  Informed Consent: Prior to proceeding with today's appointment, two pieces of identifying information were obtained from [place] to verify identity. In addition, Michaele's physical location at the time of this appointment was obtained. Nija reported she was at home and provided the address. In the event of technical difficulties, Nixie shared a phone number she could be reached at. Nishat and this provider participated in today's telepsychological service. Also, Kween denied anyone else being present in the room or on the WebEx appointment.   The provider's role was explained to Johnson. The provider reviewed and discussed issues of confidentiality, privacy, and limits therein (e.g., reporting obligations). In addition to verbal informed consent, written informed consent for psychological services was obtained from [place] prior to the initial intake interview. Written consent included information concerning the practice, financial arrangements, and confidentiality and patients' rights. Since the clinic is not a 24/7 crisis center, mental health emergency resources were shared, and the provider explained MyChart, e-mail, voicemail, and/or other messaging systems should be utilized only for non-emergency reasons. This provider also explained that information obtained during appointments will be placed in Jaynie's medical record in a confidential manner and relevant information will be shared with other providers at [facility] that she meets with for coordination of care. Chinwe verbally acknowledged understanding of the aforementioned, and agreed to use mental  health emergency resources discussed if needed. Moreover, Clariece agreed information may be shared with other [facility] providers as needed for coordination of care. By signing the service agreement document, Jaselyn provided written consent for coordination of care.   Prior to initiating telepsychological services, Britton was provided with an informed consent document, which included the development of a safety plan (i.e., an emergency contact and emergency resources) in the event of an emergency/crisis. Zully expressed understanding of the rationale of the safety plan and provided consent for this provider to reach out to her emergency contact in the event of an emergency/crisis. Verenise returned the completed consent form prior to today's appointment. This provider verbally reviewed the consent form during today's appointment prior to proceeding with the appointment. Averyana verbally acknowledged understanding that she is ultimately responsible for understanding her insurance benefits as it relates to reimbursement of telepsychological and in-person services. This provider also reviewed confidentiality, as it relates to telepsychological services, as well as the rationale for telepsychological services. More specifically, this provider's clinic is limiting in-person visits due to COVID-19. Therapeutic services will resume to in-person appointments once deemed appropriate. Mickayla expressed understanding regarding the rationale for telepsychological services. In addition, this provider explained the telepsychological services informed consent document would be considered an addendum to the initial consent document/service agreement. Loretto verbally consented to proceed.   Chief Complaint/HPI: Shanequia was referred by Dr. Ilene Qua. During the initial appointment with Dr. Ilene Qua at [facility] on March 07, 2019, Mina reported experiencing the following: frequently drinking liquids with calories,  struggling with emotional eating and skipping meals frequently.   During today's appointment, Dorrene reported, "I do not eat a whole lot." She acknowledged skipping meals; however, she reported she is working towards eating regularly. Clover was verbally administered a  questionnaire assessing various behaviors related to emotional eating. Aunika endorsed the following: eat certain foods when you are anxious, stressed, depressed, or your feelings are hurt, not worry about what you eat when you are in a good mood and eat as a reward. She shared she craves the following: [place] fries and homemade bread. Dwight stated she used to crave sweets, such as cake. Kaytee believes the onset of emotional eating was likely in adulthood and discussed stress at work would result in her "grabbing peanut M&Ms and Pepsi." Currently, she noted she does not eat peanut M&Ms nor does she drink Pepsi. In addition, she denied a history of binge eating. Jaonna denied a history of restricting food intake, purging and engagement in other compensatory strategies, and has never been diagnosed with an eating disorder. She also denied a history of treatment for emotional eating. Moreover, Rockell indicated stress triggers emotional eating, whereas focusing on her health, walking, and exercising makes emotional eating better. Furthermore, Edwina denied other problems of concern.    Mental Status Examination:  Appearance: neat Behavior: cooperative Mood: euthymic Affect: mood congruent Speech: normal in rate, volume, and tone Eye Contact: appropriate Psychomotor Activity: appropriate Thought Process: linear, logical, and goal directed  Content/Perceptual Disturbances: denies suicidal and homicidal ideation, plan, and intent and no hallucinations, delusions, bizarre thinking or behavior reported or observed Orientation: time, person, place and purpose of appointment Cognition/Sensorium: memory, attention, language, and fund of knowledge intact  Insight:  fair Judgment: fair  Family & Psychosocial History: Tieshia reported she is married and she has two children (ages 43 and 21). 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 Additionally, Britny shared her highest level of education obtained are two master's degree. Currently, Jennah's social support system consists of her husband, co-workers, best friend, and children. Moreover, Alyannah stated she resides with her husband and daughter (age 49).   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 reports today has not had any spasms or chest pains in over 2 years     Fibroids    Gallstones    Irritable bowel syndrome    Joint pain    Lactose intolerance    Migraine    hasnt had one in a long time   Obesity, unspecified    Vitamin D deficiency    Past Surgical History:  Procedure Laterality Date   CHOLECYSTECTOMY  05/06/11   COLONOSCOPY  11/2001   polyp; negative pathology   COLONOSCOPY  09/2004   Negative   COLONOSCOPY  2017 or 2018 patient  unsure   [facility] ;    COLONOSCOPY W/ POLYPECTOMY     CYSTOSCOPY N/A 01/03/2018   Procedure: CYSTOSCOPY;  Surgeon: Bobbye Charleston, MD;  Location: [facility];  Service: Gynecology;  Laterality: N/A;   ESOPHAGOGASTRODUODENOSCOPY  1/10   normal   FLEXIBLE SIGMOIDOSCOPY  03/05/2012   Procedure: FLEXIBLE SIGMOIDOSCOPY;  Surgeon: Inda Castle, MD;  [facility];  Service: Endoscopy;  Laterality: N/A;   LAPAROSCOPIC DECORTICATION / DUBULKING / ABLATION RENAL CYSTS     saline histogram   ROBOTIC ASSISTED LAPAROSCOPIC HYSTERECTOMY AND SALPINGECTOMY Bilateral  01/03/2018   Procedure: XI ROBOTIC ASSISTED LAPAROSCOPIC HYSTERECTOMY AND  SALPINGECTOMY;  Surgeon: Bobbye Charleston, MD;  Location: [facility];  Service: Gynecology;  Laterality: Bilateral;  WITH BED AFTER   TONSILLECTOMY  07/30/07   Uterine US  09/2005   Large endometrial stripe; small ovarian cyst   Current Outpatient Medications on File Prior to Visit  Medication Sig Dispense Refill   Cholecalciferol (VITAMIN D3) 125 MCG (5000 UT) CAPS Take 5,000 Units by mouth.     fluticasone (FLONASE) 50 MCG/ACT nasal spray Place 2 sprays into both nostrils daily as needed (for allergies.). 48 g 3   ibuprofen (ADVIL,MOTRIN) 600 MG tablet Take 1 tablet (600 mg total) by mouth every 8 (eight) hours as needed for fever, headache, mild pain or moderate pain. With food (Patient not taking: Reported on 03/07/2019) 45 tablet 1   levocetirizine (XYZAL) 5 MG tablet Take 1 tablet (5 mg total) by mouth every evening. 90 tablet 3   montelukast (SINGULAIR) 10 MG tablet Take 1 tablet (10 mg total) by mouth at bedtime. 90 tablet 3   triamcinolone cream (KENALOG) 0.1 % Apply 1 application topically 2 (two) times daily as needed (for Eczema).     vitamin B-12 (CYANOCOBALAMIN) 1000 MCG tablet Take 1,000 mcg by mouth daily.     No current facility-administered medications on file prior to visit.   Keta denied a history of head injuries and loss of consciousness.    Mental Health History: Analaya denied a history of therapeutic services. Willy denied a history of hospitalizations for psychiatric concerns, and has never met with a psychiatrist. Garnett denied ever being prescribed psychotropic medications. Etsuko denied a family history of mental health related concerns. Annalycia denied a trauma history, including psychological, physical  and sexual abuse, as well as neglect.   Ronniesha described her typical mood as "happy." Aside from concerns noted above and endorsed on the PHQ-9, Odena reported experiencing worry thoughts about the upcoming school year. She also discussed feeling stuck with her weight loss and  frustrated with her efforts to lose weight. She further shared her weight has impacted her self-image and confidence. Tashauna endorsed "occassional" alcohol use. She denied tobacco use. She denied illicit/recreational substance use. Regarding caffeine intake, Mahasin reported consuming 160z of coffee "maybe twice, three times at the most" a week. Furthermore, Sharri denied experiencing the following: hopelessness, hallucinations and delusions, paranoia, mania, social withdrawal, crying spells and decreased motivation. She also denied history of and current suicidal ideation, plan, and intent; history of and current homicidal ideation, plan, and intent; and history of and current engagement in self-harm.  The following strengths were reported by Dede: creative, good friend, helpful, and enjoys writing, traveling, and cooking. The following strengths were observed by this provider: ability to express thoughts and feelings during the therapeutic session, ability to establish and benefit from a therapeutic relationship, ability to learn and practice coping skills, willingness to work toward established goal(s) with the clinic and ability to engage in reciprocal conversation.  Legal History: Cambreigh denied a history of legal involvement.   Structured Assessment Results: The Patient Health Questionnaire-9 (PHQ-9) is a self-report measure that assesses symptoms and severity of depression over the course of the last two weeks. Elleen obtained a score of 4 suggesting minimal depression. Shahed finds the endorsed symptoms to be not difficult at all. Little interest or pleasure in doing things 0  Feeling down, depressed, or hopeless 0  Trouble falling or staying asleep, or sleeping too much 0  Feeling  tired or having little energy 0  Poor appetite or overeating 2  Feeling bad about yourself --- or that you are a failure or have let yourself or your family down 0  Trouble concentrating on things, such as reading the newspaper or  watching television 2  Moving or speaking so slowly that other people could have noticed? Or the opposite --- being so fidgety or restless that you have been moving around a lot more than usual 0  Thoughts that you would be better off dead or hurting yourself in some way 0  PHQ-9 Score 4    The Generalized Anxiety Disorder-7 (GAD-7) is a brief self-report measure that assesses symptoms of anxiety over the course of the last two weeks. Ruweyda obtained a score of 0. Feeling nervous, anxious, on edge 0  Not being able to stop or control worrying 0  Worrying too much about different things 0  Trouble relaxing 0  Being so restless that it's hard to sit still 0  Becoming easily annoyed or irritable 0  Feeling afraid as if something awful might happen 0  GAD-7 Score 0   Interventions: A chart review was conducted prior to the clinical intake interview. The PHQ-9, and GAD-7 were verbally administered as well as a Mood and Food questionnaire to assess various behaviors related to emotional eating. Throughout session, empathic reflections and validation was provided. Gena declined future appointments with this provider. Nevertheless, psychoeducation regarding emotional versus physical hunger was provided to increase awareness of hunger patterns and subsequent eating. Shamicka provided verbal consent during today's appointment for this provider to send the handout via e-mail.  Provisional DSM-5 Diagnosis: 311 (F32.8) Other Specified Depressive Disorder, Emotional Eating Behaviors  Plan: Bradee declined future appointments with this provider. She acknowledged understanding that she may request a follow-up appointment with this provider as long as she is still established with the clinic. No further follow-up by this provider planned.

## 2019-03-14 ENCOUNTER — Ambulatory Visit (INDEPENDENT_AMBULATORY_CARE_PROVIDER_SITE_OTHER): Payer: BC Managed Care – PPO | Admitting: Psychology

## 2019-03-14 ENCOUNTER — Other Ambulatory Visit: Payer: Self-pay

## 2019-03-14 DIAGNOSIS — F3289 Other specified depressive episodes: Secondary | ICD-10-CM

## 2019-03-18 ENCOUNTER — Encounter: Payer: Self-pay | Admitting: Family Medicine

## 2019-03-20 ENCOUNTER — Encounter: Payer: Self-pay | Admitting: Family Medicine

## 2019-03-20 ENCOUNTER — Ambulatory Visit (INDEPENDENT_AMBULATORY_CARE_PROVIDER_SITE_OTHER): Payer: BC Managed Care – PPO | Admitting: Family Medicine

## 2019-03-20 DIAGNOSIS — J45909 Unspecified asthma, uncomplicated: Secondary | ICD-10-CM | POA: Insufficient documentation

## 2019-03-20 DIAGNOSIS — J4520 Mild intermittent asthma, uncomplicated: Secondary | ICD-10-CM | POA: Diagnosis not present

## 2019-03-20 NOTE — Progress Notes (Signed)
Virtual Visit via Video Note  I connected with Brittney [name] on 03/20/19 at 12:00 PM EDT by a video enabled telemedicine application and verified that I am speaking with the correct person using two identifiers.  Location: Patient: home Provider: office    I discussed the limitations of evaluation and management by telemedicine and the availability of in person appointments. The patient expressed understanding and agreed to proceed.  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 Hot flashes 02/20/2019  . Constipation 02/20/2019  . Fatigue 02/20/2019  . Right knee pain 10/17/2018  . Benign paroxysmal positional vertigo 05/16/2018  . Postoperative state 01/03/2018  . Lipoma of back 10/04/2017  . Routine general medical examination at a health care facility 08/29/2017  . Fibrocystic breast changes 01/08/2016  . Hemorrhoids, external 09/30/2014  . Chronic diarrhea 08/29/2014  . 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) 07/04/2014  . Hemorrhoids, internal, with bleeding 04/22/2014  . Family history of pulmonary embolism 09/02/2013  . Family history of diabetes mellitus 09/02/2013  . Cutaneous skin tags 05/15/2013  . 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 Morbid obesity ([place]) 03/31/2008  . HEARING LOSS,  MILD 03/31/2008  . MIGRAINE HEADACHE 02/20/2007  . IBS 02/20/2007  . OSTEOARTHRITIS 02/20/2007   Past Medical History:  Diagnosis Date  . Anemia   . B12 deficiency   . Back pain   . Colon polyps   . Coronary artery abnormality    spasms   . 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 reports today has not had any spasms or chest pains in over 2 years    . Fibroids   . Gallstones   . Irritable bowel syndrome   . Joint pain   . Lactose intolerance   . Migraine    hasnt had one in a long time  . Obesity, unspecified   . Vitamin D deficiency    Past Surgical History:  Procedure Laterality Date  . CHOLECYSTECTOMY  05/06/11  . COLONOSCOPY  11/2001   polyp; negative pathology  . COLONOSCOPY  09/2004   Negative  . COLONOSCOPY  2017 or 2018 patient  unsure   [facility] ;   . COLONOSCOPY W/ POLYPECTOMY    . CYSTOSCOPY N/A 01/03/2018   Procedure: CYSTOSCOPY;  Surgeon: Bobbye Charleston, MD;  [facility];  Service: Gynecology;  Laterality: N/A;  . ESOPHAGOGASTRODUODENOSCOPY  1/10   normal  . FLEXIBLE SIGMOIDOSCOPY  03/05/2012   Procedure: FLEXIBLE SIGMOIDOSCOPY;  Surgeon: Inda Castle,  MD;  [facility];  Service: Endoscopy;  Laterality: N/A;  . LAPAROSCOPIC DECORTICATION / DUBULKING / ABLATION RENAL CYSTS     saline histogram  . ROBOTIC ASSISTED LAPAROSCOPIC HYSTERECTOMY AND SALPINGECTOMY Bilateral 01/03/2018   Procedure: XI ROBOTIC ASSISTED LAPAROSCOPIC HYSTERECTOMY AND SALPINGECTOMY;  Surgeon: Bobbye Charleston, MD;  [facility];  Service: Gynecology;  Laterality: Bilateral;  WITH BED AFTER  . TONSILLECTOMY  07/30/07  . Uterine US  09/2005   Large endometrial stripe; small ovarian cyst   Social History   Tobacco Use  . Smoking status: Never Smoker  . Smokeless tobacco: Never Used  Substance Use Topics  . Alcohol use:  No    Alcohol/week: 0.0 standard drinks  . Drug use: No   Family History  Problem Relation Age of Onset  . Heart disease Father   . Hypertension Father   . Heart attack Father   . Diabetes Father   . Sleep apnea Mother   . Obesity Mother   . Ulcers Brother   . Clotting disorder Brother   . Ovarian cancer Maternal Grandmother   . Colon polyps Maternal Grandmother   . Colon cancer Maternal Grandmother        40's  . Diabetes Paternal Grandmother   . Hypertension Paternal Grandmother   . Breast cancer Maternal Aunt        Age 43's  . Diabetes Paternal Aunt        x 5  . Lung cancer Maternal Grandfather        SMOKER   Allergies  Allergen Reactions  . Prevacid [Lansoprazole] Rash   Current Outpatient Medications on File Prior to Visit  Medication Sig Dispense Refill  . Cholecalciferol (VITAMIN D3) 125 MCG (5000 UT) CAPS Take 5,000 Units by mouth.    . fluticasone (FLONASE) 50 MCG/ACT nasal spray Place 2 sprays into both nostrils daily as needed (for allergies.). 48 g 3  . ibuprofen (ADVIL,MOTRIN) 600 MG tablet Take 1 tablet (600 mg total) by mouth every 8 (eight) hours as needed for fever, headache, mild pain or moderate pain. With food 45 tablet 1  . levocetirizine (XYZAL) 5 MG tablet Take 1 tablet (5 mg total) by mouth every evening. 90 tablet 3  . montelukast (SINGULAIR) 10 MG tablet Take 1 tablet (10 mg total) by mouth at bedtime. 90 tablet 3  . triamcinolone cream (KENALOG) 0.1 % Apply 1 application topically 2 (two) times daily as needed (for Eczema).    . vitamin B-12 (CYANOCOBALAMIN) 1000 MCG tablet Take 1,000 mcg by mouth daily.     No current facility-administered medications on file prior to visit.      Review of Systems  Constitutional: Negative for chills, fever, malaise/fatigue and weight loss.       Pos for difficulty loosing weight   HENT: Negative for hearing loss.   Eyes: Negative for discharge and redness.  Respiratory: Negative for cough and shortness  of breath.        No wheezing currently  She does wheeze with upper resp infections   Cardiovascular: Negative for chest pain and palpitations.  Gastrointestinal: Positive for diarrhea. Negative for heartburn, nausea and vomiting.       Chronic IBS  Neurological: Negative for dizziness and headaches.  Psychiatric/Behavioral: Negative for depression.    Observations/Objective: Patient appears well, in no distress Weight is baseline  No facial swelling or asymmetry Normal voice-not hoarse and no slurred speech No obvious tremor or mobility impairment Moving neck and UEs normally  Able to hear the call well  No cough or shortness of breath during interview  Talkative and mentally sharp with no cognitive changes No skin changes on face or neck , no rash or pallor Affect is normal    Assessment and Plan: Problem List Items Addressed This Visit      Respiratory   Mild reactive airways disease    Pt only has wheezing/reactive airways with upper respiratory illnesses (uses albuterol if needed)  For this reason-her risk of covid complications is higher and I would rather she work from home if possible to minimize public contact  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)         Other   Morbid obesity ([place]) - Primary    Pt is at inc risk of covid/complications of covid due to her obesity  We will fill out job accomodation form 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  (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 The patient was provided an opportunity to ask questions and all were answered. The patient agreed with the plan and demonstrated an understanding of the instructions.   The patient was advised to call back or seek an in-person evaluation if the symptoms worsen or if the condition fails to improve as anticipated.  Loura Pardon, MD

## 2019-03-20 NOTE — Assessment & Plan Note (Signed)
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

## 2019-03-20 NOTE — Assessment & Plan Note (Addendum)
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

## 2019-03-21 ENCOUNTER — Ambulatory Visit (INDEPENDENT_AMBULATORY_CARE_PROVIDER_SITE_OTHER): Payer: BC Managed Care – PPO | Admitting: Family Medicine

## 2019-03-21 ENCOUNTER — Encounter (INDEPENDENT_AMBULATORY_CARE_PROVIDER_SITE_OTHER): Payer: Self-pay | Admitting: Family Medicine

## 2019-03-21 ENCOUNTER — Other Ambulatory Visit: Payer: Self-pay

## 2019-03-21 VITALS — BP 92/61 | HR 67 | Temp 98.0°F | Ht 67.0 in | Wt 265.0 lb

## 2019-03-21 DIAGNOSIS — E559 Vitamin D deficiency, unspecified: Secondary | ICD-10-CM | POA: Diagnosis not present

## 2019-03-21 DIAGNOSIS — E8881 Metabolic syndrome: Secondary | ICD-10-CM

## 2019-03-21 DIAGNOSIS — E7849 Other hyperlipidemia: Secondary | ICD-10-CM | POA: Diagnosis not present

## 2019-03-21 DIAGNOSIS — Z9189 Other specified personal risk factors, not elsewhere classified: Secondary | ICD-10-CM | POA: Diagnosis not present

## 2019-03-21 DIAGNOSIS — Z6841 Body Mass Index (BMI) 40.0 and over, adult: Secondary | ICD-10-CM

## 2019-03-21 MED ORDER — VITAMIN D (ERGOCALCIFEROL) 1.25 MG (50000 UNIT) PO CAPS: 50000.0000 [IU] | ORAL_CAPSULE | ORAL | 0 refills | Status: DC

## 2019-03-25 NOTE — Progress Notes (Signed)
Office: [phone]  /  Fax: [phone]   HPI:   Chief Complaint: OBESITY Brittney [name] is here to discuss her progress with her obesity treatment plan. She is on the Category 3 plan and is following her eating plan approximately 80% of the time. She states she is riding a bike, cardio/weights 45 minutes 7 times per week. Brittney [name] didn't like the dairy amount on the plan secondary to a noticeable increase in mucus. She found the quantity of food to be difficult and often struggled to get 8 ounces of meat in at dinner. She reports snacking on strawberries and watermelon. Her weight is 265 lb (120.2 kg) today and has had a weight loss of 1 pound over a period of 2 weeks since her last visit. She has lost 1 lb since starting treatment with [place].  Hyperlipidemia Brittney [name] has hyperlipidemia and has been trying to improve her cholesterol levels with intensive lifestyle modification including a low saturated fat diet, exercise and weight loss. Her last LDL was 116 on 03/07/2019 and HDL 46. She is not on a statin.  Insulin Resistance Brittney [name] has a diagnosis of insulin resistance based on her elevated fasting insulin level >5. Her last insulin was 10.8 on 03/07/2019 and A1c was 4.4 on 02/21/2019. Although Brittney [name]'s blood glucose readings are still under good control, insulin resistance puts her at greater risk of metabolic syndrome and diabetes. She is not taking metformin currently and continues to work on diet and exercise to decrease risk of diabetes.  Vitamin D deficiency Brittney [name] has a diagnosis of Vitamin D deficiency. She is currently taking prescription Vitamin D and denies nausea, vomiting or muscle weakness but reports fatigue.  At risk for osteopenia and osteoporosis Brittney [name] is at higher risk of osteopenia and osteoporosis due to Vitamin D deficiency.   ASSESSMENT AND PLAN:  Other hyperlipidemia  Insulin resistance  At risk for osteoporosis  Vitamin D deficiency - Plan: Vitamin D, Ergocalciferol, (DRISDOL) 1.25 MG  (50000 UT) CAPS capsule  Class 3 severe obesity with serious comorbidity and body mass index (BMI) of 40.0 to 44.9 in adult, unspecified obesity type (HCC)  PLAN:  Hyperlipidemia Brittney [name] was informed of the American Heart Association Guidelines emphasizing intensive lifestyle modifications as the first line treatment for hyperlipidemia. We discussed many lifestyle modifications today in depth, and Brittney [name] will continue to work on decreasing saturated fats such as fatty red meat, butter and many fried foods. She will have repeat FLP in 3 months. She will increase vegetables and lean protein in her diet and continue to work on exercise and weight loss efforts.  Insulin Resistance Brittney [name] will continue to work on weight loss, exercise, and decreasing simple carbohydrates in her diet to help decrease the risk of diabetes. We dicussed metformin including benefits and risks. She was informed that eating too many simple carbohydrates or too many calories at one sitting increases the likelihood of GI side effects. Brittney [name] will have repeat labs performed in 3 months.  Vitamin D Deficiency Brittney [name] was informed that low Vitamin D levels contributes to fatigue and are associated with obesity, breast, and colon cancer. She agrees to continue to take prescription Vit D @ 50,000 IU every week #4 with 0 refills and will follow-up for routine testing of Vitamin D, at least 2-3 times per year. She was informed of the risk of over-replacement of Vitamin D and agrees to not increase her dose unless she discusses this with [place] first. Brittney [name] agrees to follow-up with our clinic in 2 weeks.  At risk for osteopenia and osteoporosis Brittney [name] was given extended  (15 minutes) osteoporosis prevention counseling today. Brittney [name] is at risk for osteopenia and osteoporsis due to her Vitamin D deficiency. She was encouraged to take her Vitamin D and follow her higher calcium diet and increase strengthening exercise to help strengthen her bones and decrease her risk  of osteopenia and osteoporosis.  Obesity Brittney [name] is currently in the action stage of change. As such, her goal is to continue with weight loss efforts. She has agreed to follow the Category 3 plan with breakfast options. Brittney [name] has been instructed to work up to a goal of 150 minutes of combined cardio and strengthening exercise per week for weight loss and overall health benefits. We discussed the following Behavioral Modification Strategies today: increasing lean protein intake, increasing vegetables, work on meal planning and easy cooking plans, keeping healthy foods in the home, and planning for success.  Brittney [name] has agreed to follow-up with our clinic in 2 weeks. She was informed of the importance of frequent follow-up visits to maximize her success with intensive lifestyle modifications for her multiple health conditions.  ALLERGIES: Allergies  Allergen Reactions   Prevacid [Lansoprazole] Rash    MEDICATIONS: Current Outpatient Medications on File Prior to Visit  Medication Sig Dispense Refill   Cholecalciferol (VITAMIN D3) 125 MCG (5000 UT) CAPS Take 5,000 Units by mouth.     fluticasone (FLONASE) 50 MCG/ACT nasal spray Place 2 sprays into both nostrils daily as needed (for allergies.). 48 g 3   ibuprofen (ADVIL,MOTRIN) 600 MG tablet Take 1 tablet (600 mg total) by mouth every 8 (eight) hours as needed for fever, headache, mild pain or moderate pain. With food 45 tablet 1   levocetirizine (XYZAL) 5 MG tablet Take 1 tablet (5 mg total) by mouth every evening. 90 tablet 3   montelukast (SINGULAIR) 10 MG tablet Take 1 tablet (10 mg total) by mouth at bedtime. 90 tablet 3   triamcinolone cream (KENALOG) 0.1 % Apply 1 application topically 2 (two) times daily as needed (for Eczema).     vitamin B-12 (CYANOCOBALAMIN) 1000 MCG tablet Take 1,000 mcg by mouth daily.     No current facility-administered medications on file prior to visit.     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 reports today has not had any spasms or chest pains in over 2 years     Fibroids    Gallstones    Irritable bowel syndrome    Joint pain    Lactose intolerance    Migraine    hasnt had one in a long time   Obesity, unspecified    Vitamin D deficiency     PAST SURGICAL HISTORY: Past Surgical History:  Procedure Laterality Date   CHOLECYSTECTOMY  05/06/11   COLONOSCOPY  11/2001   polyp; negative pathology   COLONOSCOPY  09/2004   Negative   COLONOSCOPY  2017 or 2018 patient  unsure   [facility] ;    COLONOSCOPY W/ POLYPECTOMY     CYSTOSCOPY N/A 01/03/2018   Procedure: CYSTOSCOPY;  Surgeon: Bobbye Charleston, MD;  Location:  [facility];  Service: Gynecology;  Laterality: N/A;   ESOPHAGOGASTRODUODENOSCOPY  1/10   normal   FLEXIBLE SIGMOIDOSCOPY  03/05/2012   Procedure: FLEXIBLE SIGMOIDOSCOPY;  Surgeon: Inda Castle, MD;  Location: [facility];  Service: Endoscopy;  Laterality: N/A;   LAPAROSCOPIC DECORTICATION / DUBULKING / ABLATION RENAL CYSTS     saline histogram   ROBOTIC ASSISTED LAPAROSCOPIC HYSTERECTOMY AND SALPINGECTOMY Bilateral 01/03/2018   Procedure: XI ROBOTIC ASSISTED LAPAROSCOPIC HYSTERECTOMY AND SALPINGECTOMY;  Surgeon: Bobbye Charleston, MD;  Location: [facility];  Service: Gynecology;  Laterality: Bilateral;  WITH BED AFTER   TONSILLECTOMY  07/30/07   Uterine US  09/2005   Large endometrial stripe; small ovarian cyst    SOCIAL HISTORY: Social History   Tobacco Use   Smoking status: Never Smoker   Smokeless tobacco: Never Used  Substance Use Topics   Alcohol use: No    Alcohol/week: 0.0 standard drinks   Drug use: No    FAMILY  HISTORY: Family History  Problem Relation Age of Onset   Heart disease Father    Hypertension Father    Heart attack Father    Diabetes Father    Sleep apnea Mother    Obesity Mother    Ulcers Brother    Clotting disorder Brother    Ovarian cancer Maternal Grandmother    Colon polyps Maternal Grandmother    Colon cancer Maternal Grandmother        40's   Diabetes Paternal Grandmother    Hypertension Paternal Grandmother    Breast cancer Maternal Aunt        Age 23's   Diabetes Paternal Aunt        x 5   Lung cancer Maternal Grandfather        SMOKER   ROS: Review of Systems  Constitutional: Positive for malaise/fatigue.  Gastrointestinal: Negative for nausea and vomiting.  Musculoskeletal:       Negative for muscle weakness.   PHYSICAL EXAM: Blood pressure 92/61, pulse 67, temperature 98 F (36.7 C), temperature source Oral, height 5\' 7"  (1.702 m), weight 265 lb (120.2 kg), last menstrual period 12/21/2017, SpO2 100 %. Body mass index is 41.5 kg/m. Physical Exam Vitals signs reviewed.  Constitutional:      Appearance: Normal appearance. She is obese.  Cardiovascular:     Rate and Rhythm: Normal rate.     Pulses: Normal pulses.  Pulmonary:     Effort: Pulmonary effort is normal.     Breath sounds: Normal breath sounds.  Musculoskeletal: Normal range of motion.  Skin:    General: Skin is warm and dry.  Neurological:     Mental Status: She is alert and oriented to person, place, and time.  Psychiatric:        Behavior: Behavior normal.   RECENT LABS AND TESTS: BMET    Component Value Date/Time   NA 138 02/21/2019 0752   NA 140 07/02/2014 1253   K 4.4 02/21/2019 0752   K 4.2 07/02/2014 1253   CL 104 02/21/2019 0752   CL 107 07/02/2014 1253   CO2 27 02/21/2019 0752   CO2 31 07/02/2014 1253   GLUCOSE 92 02/21/2019 0752   GLUCOSE 89 07/02/2014 1253   BUN 12 02/21/2019 0752   BUN 10 07/02/2014 1253   CREATININE 1.03 02/21/2019 0752    CREATININE 1.07 07/02/2014 1253   CREATININE 0.82 09/02/2013 1250   CALCIUM 9.0 02/21/2019 0752   CALCIUM 8.7 07/02/2014 1253   GFRNONAA >60 09/16/2016 1937   GFRNONAA >  60 07/02/2014 1253   GFRNONAA >60 07/05/2013 1025   GFRAA >60 09/16/2016 1937   GFRAA >60 07/02/2014 1253   GFRAA >60 07/05/2013 1025   Lab Results  Component Value Date   HGBA1C 4.4 02/21/2019   HGBA1C 5.8 02/21/2019   Lab Results  Component Value Date   INSULIN 10.8 03/07/2019   CBC    Component Value Date/Time   WBC 8.2 02/21/2019 0752   RBC 5.10 02/21/2019 0752   HGB 13.6 02/21/2019 0752   HGB 11.4 (L) 07/02/2014 1253   HCT 42.8 02/21/2019 0752   HCT 36.1 07/02/2014 1253   PLT 272.0 02/21/2019 0752   PLT 260 07/02/2014 1253   MCV 84.1 02/21/2019 0752   MCV 81 07/02/2014 1253   MCH 24.6 (L) 12/22/2017 0850   MCHC 31.8 02/21/2019 0752   RDW 16.0 (H) 02/21/2019 0752   RDW 16.9 (H) 07/02/2014 1253   LYMPHSABS 2.6 02/21/2019 0752   LYMPHSABS 1.3 02/19/2012 0455   MONOABS 0.5 02/21/2019 0752   MONOABS 0.5 02/19/2012 0455   EOSABS 0.1 02/21/2019 0752   EOSABS 0.1 02/19/2012 0455   BASOSABS 0.1 02/21/2019 0752   BASOSABS 0.0 02/19/2012 0455   Iron/TIBC/Ferritin/ %Sat    Component Value Date/Time   IRON 152 (H) 09/20/2016 1428   FERRITIN 6.7 (L) 09/20/2016 1428   IRONPCTSAT 32.6 09/20/2016 1428   Lipid Panel     Component Value Date/Time   CHOL 176 03/07/2019 0957   TRIG 68 03/07/2019 0957   HDL 46 03/07/2019 0957   CHOLHDL 5 09/18/2018 1624   VLDL 13.4 09/18/2018 1624   LDLCALC 116 (H) 03/07/2019 0957   Hepatic Function Panel     Component Value Date/Time   PROT 6.7 02/21/2019 0752   PROT 7.0 07/02/2014 1253   ALBUMIN 4.0 02/21/2019 0752   ALBUMIN 3.5 07/02/2014 1253   AST 14 02/21/2019 0752   AST 26 07/02/2014 1253   ALT 14 02/21/2019 0752   ALT 25 07/02/2014 1253   ALKPHOS 71 02/21/2019 0752   ALKPHOS 93 07/02/2014 1253   BILITOT 0.5 02/21/2019 0752   BILITOT 0.4 07/02/2014  1253   BILIDIR 0.1 07/23/2008 1707      Component Value Date/Time   TSH 2.44 02/21/2019 0752   TSH 1.16 09/18/2018 1624   TSH 1.76 08/29/2017 1255   Results for GILLIAM-WILSON, Hawley D (MRN [id number]) as of 03/25/2019 12:27  Ref. Range 02/21/2019 07:52  VITD Latest Ref Range: 30.00 - 100.00 ng/mL 16.62 (L)   OBESITY BEHAVIORAL INTERVENTION VISIT  Today's visit was #2  Starting weight: 266 lbs Starting date: 03/07/2019  Today's weight: 265 lbs Today's date: 03/21/2019 Total lbs lost to date: 1    03/21/2019  Height 5\' 7"  (1.702 m)  Weight 265 lb (120.2 kg)  BMI (Calculated) 41.5  BLOOD PRESSURE - SYSTOLIC 92  BLOOD PRESSURE - DIASTOLIC 61   Body Fat % 70.3 %  Total Body Water (lbs) 96.4 lbs   ASK: We discussed the diagnosis of obesity with Donetta D Gilliam-Wilson today and Angel agreed to give [place] permission to discuss obesity behavioral modification therapy today.  ASSESS: Dineen has the diagnosis of obesity and her BMI today is 41.5. Siennah is in the action stage of change.   ADVISE: Lunabelle was educated on the multiple health risks of obesity as well as the benefit of weight loss to improve her health. She was advised of the need for long term treatment and the importance of lifestyle modifications to improve her  current health and to decrease her risk of future health problems.  AGREE: Multiple dietary modification options and treatment options were discussed and  Kenneisha agreed to follow the recommendations documented in the above note.  ARRANGE: Kailey was educated on the importance of frequent visits to treat obesity as outlined per CMS and USPSTF guidelines and agreed to schedule her next follow up appointment today.  I, Michaelene Song, am acting as transcriptionist for Ilene Qua, MD  I have reviewed the above documentation for accuracy and completeness, and I agree with the above. - Ilene Qua, MD

## 2019-04-02 ENCOUNTER — Encounter (INDEPENDENT_AMBULATORY_CARE_PROVIDER_SITE_OTHER): Payer: Self-pay | Admitting: Family Medicine

## 2019-04-02 ENCOUNTER — Other Ambulatory Visit: Payer: Self-pay

## 2019-04-02 ENCOUNTER — Ambulatory Visit (INDEPENDENT_AMBULATORY_CARE_PROVIDER_SITE_OTHER): Payer: BC Managed Care – PPO | Admitting: Family Medicine

## 2019-04-02 VITALS — BP 95/65 | HR 66 | Temp 97.8°F | Ht 67.0 in | Wt 271.0 lb

## 2019-04-02 DIAGNOSIS — Z9189 Other specified personal risk factors, not elsewhere classified: Secondary | ICD-10-CM | POA: Diagnosis not present

## 2019-04-02 DIAGNOSIS — E8881 Metabolic syndrome: Secondary | ICD-10-CM

## 2019-04-02 DIAGNOSIS — E559 Vitamin D deficiency, unspecified: Secondary | ICD-10-CM

## 2019-04-02 DIAGNOSIS — Z6841 Body Mass Index (BMI) 40.0 and over, adult: Secondary | ICD-10-CM

## 2019-04-03 ENCOUNTER — Encounter: Payer: Self-pay | Admitting: Family Medicine

## 2019-04-03 MED ORDER — NALTREXONE-BUPROPION HCL ER 8-90 MG PO TB12: 2.0000 | ORAL_TABLET | Freq: Two times a day (BID) | ORAL | 2 refills | Status: DC

## 2019-04-04 ENCOUNTER — Other Ambulatory Visit (INDEPENDENT_AMBULATORY_CARE_PROVIDER_SITE_OTHER): Payer: Self-pay | Admitting: Family Medicine

## 2019-04-04 DIAGNOSIS — E559 Vitamin D deficiency, unspecified: Secondary | ICD-10-CM

## 2019-04-04 NOTE — Telephone Encounter (Signed)
See pt's note, in order for me to do a PA on a med Dr. Glori Bickers you have to 1st prescribe med, then the pharmacy will alert [place] when the PA is required and what phone # to call

## 2019-04-04 NOTE — Telephone Encounter (Signed)
Will await the fax with the PA info

## 2019-04-09 NOTE — Progress Notes (Signed)
Office: [phone]  /  Fax: [phone]   HPI:   Chief Complaint: OBESITY Brittney [name] is here to discuss her progress with her obesity treatment plan. She is on the Category 3 plan and is following her eating plan approximately 75-80 % of the time. She states she is on the elliptical, walking, and biking for 60 minutes 4-5 times per week. Brittney [name] voices for breakfast she is doing boiled eggs (1 egg, 2 whites and cheese) not always doing bread. She is doing microwave meal, fruit and yogurt for lunch, and fruit and pickles for snack. She is doing chicken and vegetables (4 oz of meat and 1 cup of veggies) for dinner. She is not getting in all 300 calories for snack.  Her weight is 271 lb (122.9 kg) today and has gained 6 lbs since her last visit. She has lost 0 lbs since starting treatment with [place].  Vitamin D Deficiency Brittney [name] has a diagnosis of vitamin D deficiency. She is currently taking prescription Vit D. She notes fatigue and denies nausea, vomiting or muscle weakness.  At risk for osteopenia and osteoporosis Brittney [name] is at higher risk of osteopenia and osteoporosis due to vitamin D deficiency.   Insulin Resistance Brittney [name] has a diagnosis of insulin resistance based on her elevated fasting insulin level >5. Last insulin was of 10.8 and Hgb A1c of 4.4. Although Brittney [name]'s blood glucose readings are still under good control, insulin resistance puts her at greater risk of metabolic syndrome and diabetes. She is not taking metformin currently and continues to work on diet and exercise to decrease risk of diabetes.  ASSESSMENT AND PLAN:  Vitamin D deficiency  Insulin resistance  At risk for osteoporosis  Class 3 severe obesity with serious comorbidity and body mass index (BMI) of 40.0 to 44.9 in adult, unspecified obesity type ([place])  PLAN:  Vitamin D Deficiency Brittney [name] was informed that low vitamin D levels contributes to fatigue and are associated with obesity, breast, and colon cancer. Brittney [name] agrees to continue  taking prescription Vit D 50,000 IU every week #4 and we will refill for 1 month. She will follow up for routine testing of vitamin D, at least 2-3 times per year. She was informed of the risk of over-replacement of vitamin D and agrees to not increase her dose unless she discusses this with [place] first. Brittney [name] agrees to follow up with our clinic in 2 weeks.  At risk for osteopenia and osteoporosis Brittney [name] was given extended (15 minutes) osteoporosis prevention counseling today. Brittney [name] is at risk for osteopenia and osteoporsis due to her vitamin D deficiency. She was encouraged to take her vitamin D and follow her higher calcium diet and increase strengthening exercise to help strengthen her bones and decrease her risk of osteopenia and osteoporosis.  Insulin Resistance Brittney [name] will continue to work on weight loss, exercise, and decreasing simple carbohydrates in her diet to help decrease the risk of diabetes. We dicussed metformin including benefits and risks. She was informed that eating too many simple carbohydrates or too many calories at one sitting increases the likelihood of GI side effects. Brittney [name] declined metformin for now and prescription was not written today. We will repeat labs in early November. Brittney [name] agrees to follow up with our clinic in 2 weeks as directed to monitor her progress.  Obesity Brittney [name] is currently in the action stage of change. As such, her goal is to continue with weight loss efforts She has agreed to follow the Category 3 plan Brittney [name] has been instructed  to work up to a goal of 150 minutes of combined cardio and strengthening exercise per week for weight loss and overall health benefits. We discussed the following Behavioral Modification Strategies today: increasing lean protein intake, increasing vegetables and work on meal planning and easy cooking plans, better snacking choices, and planning for success   Brittney [name] has agreed to follow up with our clinic in 2 weeks. She was informed of the importance  of frequent follow up visits to maximize her success with intensive lifestyle modifications for her multiple health conditions.  ALLERGIES: Allergies  Allergen Reactions  . Prevacid [Lansoprazole] Rash    MEDICATIONS: Current Outpatient Medications on File Prior to Visit  Medication Sig Dispense Refill  . fluticasone (FLONASE) 50 MCG/ACT nasal spray Place 2 sprays into both nostrils daily as needed (for allergies.). 48 g 3  . ibuprofen (ADVIL,MOTRIN) 600 MG tablet Take 1 tablet (600 mg total) by mouth every 8 (eight) hours as needed for fever, headache, mild pain or moderate pain. With food 45 tablet 1  . levocetirizine (XYZAL) 5 MG tablet Take 1 tablet (5 mg total) by mouth every evening. 90 tablet 3  . montelukast (SINGULAIR) 10 MG tablet Take 1 tablet (10 mg total) by mouth at bedtime. 90 tablet 3  . triamcinolone cream (KENALOG) 0.1 % Apply 1 application topically 2 (two) times daily as needed (for Eczema).    . vitamin B-12 (CYANOCOBALAMIN) 1000 MCG tablet Take 1,000 mcg by mouth daily.    . Vitamin D, Ergocalciferol, (DRISDOL) 1.25 MG (50000 UT) CAPS capsule Take 1 capsule (50,000 Units total) by mouth every 7 (seven) days. 4 capsule 0  . Cholecalciferol (VITAMIN D3) 125 MCG (5000 UT) CAPS Take 5,000 Units by mouth.     No current facility-administered medications on file prior to visit.     PAST MEDICAL HISTORY: Past Medical History:  Diagnosis Date  . Anemia   . B12 deficiency   . Back pain   . Colon polyps   . Coronary artery abnormality    spasms   . 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 reports today has not had any spasms or chest pains in over 2 years    . Fibroids   . Gallstones   . Irritable bowel syndrome   . Joint pain   . Lactose intolerance   . Migraine    hasnt had one in a long time  . Obesity, unspecified   .  Vitamin D deficiency     PAST SURGICAL HISTORY: Past Surgical History:  Procedure Laterality Date  . CHOLECYSTECTOMY  05/06/11  . COLONOSCOPY  11/2001   polyp; negative pathology  . COLONOSCOPY  09/2004   Negative  . COLONOSCOPY  2017 or 2018 patient  unsure   [facility] ;   . COLONOSCOPY W/ POLYPECTOMY    . CYSTOSCOPY N/A 01/03/2018   Procedure: CYSTOSCOPY;  Surgeon: Bobbye Charleston, MD;  Location: [facility];  Service: Gynecology;  Laterality: N/A;  . ESOPHAGOGASTRODUODENOSCOPY  1/10   normal  . FLEXIBLE SIGMOIDOSCOPY  03/05/2012   Procedure: FLEXIBLE SIGMOIDOSCOPY;  Surgeon: Inda Castle, MD;  Location: [facility];  Service: Endoscopy;  Laterality: N/A;  . LAPAROSCOPIC DECORTICATION / DUBULKING / ABLATION RENAL CYSTS     saline histogram  . ROBOTIC ASSISTED LAPAROSCOPIC HYSTERECTOMY AND SALPINGECTOMY Bilateral 01/03/2018   Procedure: XI  ROBOTIC ASSISTED LAPAROSCOPIC HYSTERECTOMY AND SALPINGECTOMY;  Surgeon: Bobbye Charleston, MD;  Location: [facility];  Service: Gynecology;  Laterality: Bilateral;  WITH BED AFTER  . TONSILLECTOMY  07/30/07  . Uterine US  09/2005   Large endometrial stripe; small ovarian cyst    SOCIAL HISTORY: Social History   Tobacco Use  . Smoking status: Never Smoker  . Smokeless tobacco: Never Used  Substance Use Topics  . Alcohol use: No    Alcohol/week: 0.0 standard drinks  . Drug use: No    FAMILY HISTORY: Family History  Problem Relation Age of Onset  . Heart disease Father   . Hypertension Father   . Heart attack Father   . Diabetes Father   . Sleep apnea Mother   . Obesity Mother   . Ulcers Brother   . Clotting disorder Brother   . Ovarian cancer Maternal Grandmother   . Colon polyps Maternal Grandmother   . Colon cancer Maternal Grandmother        40's  . Diabetes Paternal Grandmother   . Hypertension Paternal Grandmother   . Breast cancer Maternal Aunt        Age 8's  . Diabetes Paternal Aunt        x 5  . Lung cancer Maternal  Grandfather        SMOKER    ROS: Review of Systems  Constitutional: Negative for malaise/fatigue and weight loss.  Gastrointestinal: Negative for nausea and vomiting.  Musculoskeletal:       Negative muscle weakness    PHYSICAL EXAM: Blood pressure 95/65, pulse 66, temperature 97.8 F (36.6 C), temperature source Oral, height 5\' 7"  (1.702 m), weight 271 lb (122.9 kg), last menstrual period 12/21/2017, SpO2 99 %. Body mass index is 42.44 kg/m. Physical Exam Vitals signs reviewed.  Constitutional:      Appearance: Normal appearance. She is obese.  Cardiovascular:     Rate and Rhythm: Normal rate.     Pulses: Normal pulses.  Pulmonary:     Effort: Pulmonary effort is normal.     Breath sounds: Normal breath sounds.  Musculoskeletal: Normal range of motion.  Skin:    General: Skin is warm and dry.  Neurological:     Mental Status: She is alert and oriented to person, place, and time.  Psychiatric:        Mood and Affect: Mood normal.        Behavior: Behavior normal.     RECENT LABS AND TESTS: BMET    Component Value Date/Time   NA 138 02/21/2019 0752   NA 140 07/02/2014 1253   K 4.4 02/21/2019 0752   K 4.2 07/02/2014 1253   CL 104 02/21/2019 0752   CL 107 07/02/2014 1253   CO2 27 02/21/2019 0752   CO2 31 07/02/2014 1253   GLUCOSE 92 02/21/2019 0752   GLUCOSE 89 07/02/2014 1253   BUN 12 02/21/2019 0752   BUN 10 07/02/2014 1253   CREATININE 1.03 02/21/2019 0752   CREATININE 1.07 07/02/2014 1253   CREATININE 0.82 09/02/2013 1250   CALCIUM 9.0 02/21/2019 0752   CALCIUM 8.7 07/02/2014 1253   GFRNONAA >60 09/16/2016 1937   GFRNONAA >60 07/02/2014 1253   GFRNONAA >60 07/05/2013 1025   GFRAA >60 09/16/2016 1937   GFRAA >60 07/02/2014 1253   GFRAA >60 07/05/2013 1025   Lab Results  Component Value Date   HGBA1C 4.4 02/21/2019   HGBA1C 5.8 02/21/2019   Lab Results  Component Value Date   INSULIN  10.8 03/07/2019   CBC    Component Value Date/Time   WBC  8.2 02/21/2019 0752   RBC 5.10 02/21/2019 0752   HGB 13.6 02/21/2019 0752   HGB 11.4 (L) 07/02/2014 1253   HCT 42.8 02/21/2019 0752   HCT 36.1 07/02/2014 1253   PLT 272.0 02/21/2019 0752   PLT 260 07/02/2014 1253   MCV 84.1 02/21/2019 0752   MCV 81 07/02/2014 1253   MCH 24.6 (L) 12/22/2017 0850   MCHC 31.8 02/21/2019 0752   RDW 16.0 (H) 02/21/2019 0752   RDW 16.9 (H) 07/02/2014 1253   LYMPHSABS 2.6 02/21/2019 0752   LYMPHSABS 1.3 02/19/2012 0455   MONOABS 0.5 02/21/2019 0752   MONOABS 0.5 02/19/2012 0455   EOSABS 0.1 02/21/2019 0752   EOSABS 0.1 02/19/2012 0455   BASOSABS 0.1 02/21/2019 0752   BASOSABS 0.0 02/19/2012 0455   Iron/TIBC/Ferritin/ %Sat    Component Value Date/Time   IRON 152 (H) 09/20/2016 1428   FERRITIN 6.7 (L) 09/20/2016 1428   IRONPCTSAT 32.6 09/20/2016 1428   Lipid Panel     Component Value Date/Time   CHOL 176 03/07/2019 0957   TRIG 68 03/07/2019 0957   HDL 46 03/07/2019 0957   CHOLHDL 5 09/18/2018 1624   VLDL 13.4 09/18/2018 1624   LDLCALC 116 (H) 03/07/2019 0957   Hepatic Function Panel     Component Value Date/Time   PROT 6.7 02/21/2019 0752   PROT 7.0 07/02/2014 1253   ALBUMIN 4.0 02/21/2019 0752   ALBUMIN 3.5 07/02/2014 1253   AST 14 02/21/2019 0752   AST 26 07/02/2014 1253   ALT 14 02/21/2019 0752   ALT 25 07/02/2014 1253   ALKPHOS 71 02/21/2019 0752   ALKPHOS 93 07/02/2014 1253   BILITOT 0.5 02/21/2019 0752   BILITOT 0.4 07/02/2014 1253   BILIDIR 0.1 07/23/2008 1707      Component Value Date/Time   TSH 2.44 02/21/2019 0752   TSH 1.16 09/18/2018 1624   TSH 1.76 08/29/2017 1255      OBESITY BEHAVIORAL INTERVENTION VISIT  Today's visit was # 3   Starting weight: 266 lbs Starting date: 03/07/2019 Today's weight : 271 lbs Today's date: 04/02/2019 Total lbs lost to date: 0    ASK: We discussed the diagnosis of obesity with Adara D Gilliam-Wilson today and Amoura agreed to give [place] permission to discuss obesity behavioral  modification therapy today.  ASSESS: Laquiesha has the diagnosis of obesity and her BMI today is 42.43 Kately is in the action stage of change   ADVISE: Aaria was educated on the multiple health risks of obesity as well as the benefit of weight loss to improve her health. She was advised of the need for long term treatment and the importance of lifestyle modifications to improve her current health and to decrease her risk of future health problems.  AGREE: Multiple dietary modification options and treatment options were discussed and  Keylin agreed to follow the recommendations documented in the above note.  ARRANGE: Yazlin was educated on the importance of frequent visits to treat obesity as outlined per CMS and USPSTF guidelines and agreed to schedule her next follow up appointment today.  I, Trixie Dredge, am acting as transcriptionist for Ilene Qua, MD  I have reviewed the above documentation for accuracy and completeness, and I agree with the above. - Ilene Qua, MD

## 2019-04-11 NOTE — Telephone Encounter (Signed)
[facility] never sent [place] a message saying Rx needs PA

## 2019-04-14 ENCOUNTER — Other Ambulatory Visit (INDEPENDENT_AMBULATORY_CARE_PROVIDER_SITE_OTHER): Payer: Self-pay | Admitting: Family Medicine

## 2019-04-14 DIAGNOSIS — E559 Vitamin D deficiency, unspecified: Secondary | ICD-10-CM

## 2019-04-16 ENCOUNTER — Other Ambulatory Visit (INDEPENDENT_AMBULATORY_CARE_PROVIDER_SITE_OTHER): Payer: Self-pay | Admitting: Family Medicine

## 2019-04-16 DIAGNOSIS — E559 Vitamin D deficiency, unspecified: Secondary | ICD-10-CM

## 2019-04-18 ENCOUNTER — Telehealth (INDEPENDENT_AMBULATORY_CARE_PROVIDER_SITE_OTHER): Payer: BC Managed Care – PPO | Admitting: Family Medicine

## 2019-04-18 ENCOUNTER — Encounter (INDEPENDENT_AMBULATORY_CARE_PROVIDER_SITE_OTHER): Payer: Self-pay | Admitting: Family Medicine

## 2019-04-18 ENCOUNTER — Other Ambulatory Visit: Payer: Self-pay

## 2019-04-18 DIAGNOSIS — Z6841 Body Mass Index (BMI) 40.0 and over, adult: Secondary | ICD-10-CM

## 2019-04-18 DIAGNOSIS — E559 Vitamin D deficiency, unspecified: Secondary | ICD-10-CM | POA: Diagnosis not present

## 2019-04-18 DIAGNOSIS — E8881 Metabolic syndrome: Secondary | ICD-10-CM

## 2019-04-18 MED ORDER — VITAMIN D (ERGOCALCIFEROL) 1.25 MG (50000 UNIT) PO CAPS: 50000.0000 [IU] | ORAL_CAPSULE | ORAL | 0 refills | Status: DC

## 2019-04-18 MED ORDER — METFORMIN HCL 500 MG PO TABS: 500.0000 mg | ORAL_TABLET | Freq: Every day | ORAL | 0 refills | Status: DC

## 2019-04-23 NOTE — Progress Notes (Signed)
Office: [phone]  /  Fax: [phone] TeleHealth Visit:  Brittney [name] has verbally consented to this TeleHealth visit today. The patient is located at home, the provider is located at the [facility] office. The participants in this visit include the listed provider and patient. The visit was conducted today via webex.  HPI:   Chief Complaint: OBESITY Brittney [name] is here to discuss her progress with her obesity treatment plan. She is on the Category 3 plan and is following her eating plan approximately 100 % of the time. She states she is bike riding, lifting weights, walking, and on the elliptical for 60 minutes 4-5 times per week. Brittney [name] reports 100 % compliance with the meal plan. She is up in weight to 276 lbs (from 271 lbs at her last virtual appointment). For breakfast, she is doing eggs with spinach and a wrap. For snack, she is doing cheese and almonds. For lunch, she is doing a sandwich with veggies with fruit and plain [place] yogurt. She is doing chicken or pork chop for dinner, with green beans, red potatoes or sweet potato. She is not weighing throughout the 2 weeks. We were unable to weigh the patient today for this TeleHealth visit. She feels as if she has gained 5 lbs since her last visit. She has lost 0 lbs since starting treatment with [place].  Vitamin D Deficiency Brittney [name] has a diagnosis of vitamin D deficiency. She is currently taking prescription Vit D. She notes fatigue and denies nausea, vomiting or muscle weakness.  Insulin Resistance Brittney [name] has a diagnosis of insulin resistance based on her elevated fasting insulin level >5. Although Brittney [name]'s blood glucose readings are still under good control, insulin resistance puts her at greater risk of metabolic syndrome and diabetes. She is not taking metformin currently and notes occasional carbohydrate cravings for starch at dinner. She continues to work on diet and exercise to decrease risk of diabetes.  ASSESSMENT AND PLAN:   Insulin resistance - Plan: metFORMIN (GLUCOPHAGE) 500 MG tablet  Vitamin D deficiency - Plan: Vitamin D, Ergocalciferol, (DRISDOL) 1.25 MG (50000 UT) CAPS capsule  Class 3 severe obesity with serious comorbidity and body mass index (BMI) of 40.0 to 44.9 in adult, unspecified obesity type ([place])  PLAN:  Vitamin D Deficiency Brittney [name] was informed that low vitamin D levels contributes to fatigue and are associated with obesity, breast, and colon cancer. Brittney [name] agrees to continue taking prescription Vit D 50,000 IU every week #4 and we will refill for 1 month. She will follow up for routine testing of vitamin D, at least 2-3 times per year. She was informed of the risk of over-replacement of vitamin D and agrees to not increase her dose unless she discusses this with [place] first. [name] agrees to follow up with our clinic in 2 weeks.  Insulin Resistance Brittney [name] will continue to work on weight loss, exercise, and decreasing simple carbohydrates in her diet to help decrease the risk of diabetes. We dicussed metformin including benefits and risks. She was informed that eating too many simple carbohydrates or too many calories at one sitting increases the likelihood of GI side effects. Brittney [name] agrees to start metformin 500 mg PO q AM #30 with no refills. Brittney [name] agrees to follow up with our clinic in 2 weeks as directed to monitor her progress.  Obesity Brittney [name] is currently in the action stage of change. As such, her goal is to continue with weight loss efforts She has agreed to follow the Category 3  plan Brittney [name] has been instructed to work up to a goal of 150 minutes of combined cardio and strengthening exercise per week for weight loss and overall health benefits. We discussed the following Behavioral Modification Strategies today: increasing lean protein intake, increasing vegetables and work on meal planning and easy cooking plans, keeping healthy foods in the home, and planning for success   Brittney [name] has agreed to follow up with our  clinic in 2 weeks. She was informed of the importance of frequent follow up visits to maximize her success with intensive lifestyle modifications for her multiple health conditions.  ALLERGIES: Allergies  Allergen Reactions  . Prevacid [Lansoprazole] Rash    MEDICATIONS: Current Outpatient Medications on File Prior to Visit  Medication Sig Dispense Refill  . fluticasone (FLONASE) 50 MCG/ACT nasal spray Place 2 sprays into both nostrils daily as needed (for allergies.). 48 g 3  . ibuprofen (ADVIL,MOTRIN) 600 MG tablet Take 1 tablet (600 mg total) by mouth every 8 (eight) hours as needed for fever, headache, mild pain or moderate pain. With food 45 tablet 1  . levocetirizine (XYZAL) 5 MG tablet Take 1 tablet (5 mg total) by mouth every evening. 90 tablet 3  . montelukast (SINGULAIR) 10 MG tablet Take 1 tablet (10 mg total) by mouth at bedtime. 90 tablet 3  . triamcinolone cream (KENALOG) 0.1 % Apply 1 application topically 2 (two) times daily as needed (for Eczema).    . vitamin B-12 (CYANOCOBALAMIN) 1000 MCG tablet Take 1,000 mcg by mouth daily.    . Cholecalciferol (VITAMIN D3) 125 MCG (5000 UT) CAPS Take 5,000 Units by mouth.    . Naltrexone-buPROPion HCl ER 8-90 MG TB12 Take 2 tablets by mouth 2 (two) times daily. (Patient not taking: Reported on 04/18/2019) 120 tablet 2   No current facility-administered medications on file prior to visit.     PAST MEDICAL HISTORY: Past Medical History:  Diagnosis Date  . Anemia   . B12 deficiency   . Back pain   . Colon polyps   . Coronary artery abnormality    spasms   . 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 reports today has not had any spasms or chest pains in over 2 years    . Fibroids   . Gallstones   . Irritable bowel syndrome   . Joint pain   . Lactose intolerance   . Migraine    hasnt had  one in a long time  . Obesity, unspecified   . Vitamin D deficiency     PAST SURGICAL HISTORY: Past Surgical History:  Procedure Laterality Date  . CHOLECYSTECTOMY  05/06/11  . COLONOSCOPY  11/2001   polyp; negative pathology  . COLONOSCOPY  09/2004   Negative  . COLONOSCOPY  2017 or 2018 patient  unsure    GI ;   . COLONOSCOPY W/ POLYPECTOMY    . CYSTOSCOPY N/A 01/03/2018   Procedure: CYSTOSCOPY;  Surgeon: Bobbye Charleston, MD;  [facility]: [facility];  Service: Gynecology;  Laterality: N/A;  . ESOPHAGOGASTRODUODENOSCOPY  1/10   normal  . FLEXIBLE SIGMOIDOSCOPY  03/05/2012   Procedure: FLEXIBLE SIGMOIDOSCOPY;  Surgeon: Inda Castle, MD;  [facility];  Service: Endoscopy;  Laterality: N/A;  . LAPAROSCOPIC DECORTICATION / DUBULKING / ABLATION RENAL CYSTS     saline histogram  . ROBOTIC ASSISTED LAPAROSCOPIC HYSTERECTOMY AND  SALPINGECTOMY Bilateral 01/03/2018   Procedure: XI ROBOTIC ASSISTED LAPAROSCOPIC HYSTERECTOMY AND SALPINGECTOMY;  Surgeon: Bobbye Charleston, MD;  Location: [facility];  Service: Gynecology;  Laterality: Bilateral;  WITH BED AFTER  . TONSILLECTOMY  07/30/07  . Uterine US  09/2005   Large endometrial stripe; small ovarian cyst    SOCIAL HISTORY: Social History   Tobacco Use  . Smoking status: Never Smoker  . Smokeless tobacco: Never Used  Substance Use Topics  . Alcohol use: No    Alcohol/week: 0.0 standard drinks  . Drug use: No    FAMILY HISTORY: Family History  Problem Relation Age of Onset  . Heart disease Father   . Hypertension Father   . Heart attack Father   . Diabetes Father   . Sleep apnea Mother   . Obesity Mother   . Ulcers Brother   . Clotting disorder Brother   . Ovarian cancer Maternal Grandmother   . Colon polyps Maternal Grandmother   . Colon cancer Maternal Grandmother        40's  . Diabetes Paternal Grandmother   . Hypertension Paternal Grandmother   . Breast cancer Maternal Aunt        Age 79's  . Diabetes  Paternal Aunt        x 5  . Lung cancer Maternal Grandfather        SMOKER    ROS: Review of Systems  Constitutional: Positive for malaise/fatigue. Negative for weight loss.  Gastrointestinal: Negative for nausea and vomiting.  Musculoskeletal:       Negative muscle weakness    PHYSICAL EXAM: Pt in no acute distress  RECENT LABS AND TESTS: BMET    Component Value Date/Time   NA 138 02/21/2019 0752   NA 140 07/02/2014 1253   K 4.4 02/21/2019 0752   K 4.2 07/02/2014 1253   CL 104 02/21/2019 0752   CL 107 07/02/2014 1253   CO2 27 02/21/2019 0752   CO2 31 07/02/2014 1253   GLUCOSE 92 02/21/2019 0752   GLUCOSE 89 07/02/2014 1253   BUN 12 02/21/2019 0752   BUN 10 07/02/2014 1253   CREATININE 1.03 02/21/2019 0752   CREATININE 1.07 07/02/2014 1253   CREATININE 0.82 09/02/2013 1250   CALCIUM 9.0 02/21/2019 0752   CALCIUM 8.7 07/02/2014 1253   GFRNONAA >60 09/16/2016 1937   GFRNONAA >60 07/02/2014 1253   GFRNONAA >60 07/05/2013 1025   GFRAA >60 09/16/2016 1937   GFRAA >60 07/02/2014 1253   GFRAA >60 07/05/2013 1025   Lab Results  Component Value Date   HGBA1C 4.4 02/21/2019   HGBA1C 5.8 02/21/2019   Lab Results  Component Value Date   INSULIN 10.8 03/07/2019   CBC    Component Value Date/Time   WBC 8.2 02/21/2019 0752   RBC 5.10 02/21/2019 0752   HGB 13.6 02/21/2019 0752   HGB 11.4 (L) 07/02/2014 1253   HCT 42.8 02/21/2019 0752   HCT 36.1 07/02/2014 1253   PLT 272.0 02/21/2019 0752   PLT 260 07/02/2014 1253   MCV 84.1 02/21/2019 0752   MCV 81 07/02/2014 1253   MCH 24.6 (L) 12/22/2017 0850   MCHC 31.8 02/21/2019 0752   RDW 16.0 (H) 02/21/2019 0752   RDW 16.9 (H) 07/02/2014 1253   LYMPHSABS 2.6 02/21/2019 0752   LYMPHSABS 1.3 02/19/2012 0455   MONOABS 0.5 02/21/2019 0752   MONOABS 0.5 02/19/2012 0455   EOSABS 0.1 02/21/2019 0752   EOSABS 0.1 02/19/2012 0455   BASOSABS 0.1 02/21/2019 0752  BASOSABS 0.0 02/19/2012 0455   Iron/TIBC/Ferritin/ %Sat     Component Value Date/Time   IRON 152 (H) 09/20/2016 1428   FERRITIN 6.7 (L) 09/20/2016 1428   IRONPCTSAT 32.6 09/20/2016 1428   Lipid Panel     Component Value Date/Time   CHOL 176 03/07/2019 0957   TRIG 68 03/07/2019 0957   HDL 46 03/07/2019 0957   CHOLHDL 5 09/18/2018 1624   VLDL 13.4 09/18/2018 1624   LDLCALC 116 (H) 03/07/2019 0957   Hepatic Function Panel     Component Value Date/Time   PROT 6.7 02/21/2019 0752   PROT 7.0 07/02/2014 1253   ALBUMIN 4.0 02/21/2019 0752   ALBUMIN 3.5 07/02/2014 1253   AST 14 02/21/2019 0752   AST 26 07/02/2014 1253   ALT 14 02/21/2019 0752   ALT 25 07/02/2014 1253   ALKPHOS 71 02/21/2019 0752   ALKPHOS 93 07/02/2014 1253   BILITOT 0.5 02/21/2019 0752   BILITOT 0.4 07/02/2014 1253   BILIDIR 0.1 07/23/2008 1707      Component Value Date/Time   TSH 2.44 02/21/2019 0752   TSH 1.16 09/18/2018 1624   TSH 1.76 08/29/2017 1255      I, Trixie Dredge, am acting as transcriptionist for Ilene Qua, MD  I have reviewed the above documentation for accuracy and completeness, and I agree with the above. - Ilene Qua, MD

## 2019-05-07 ENCOUNTER — Ambulatory Visit (INDEPENDENT_AMBULATORY_CARE_PROVIDER_SITE_OTHER): Payer: BC Managed Care – PPO | Admitting: Family Medicine

## 2019-05-07 ENCOUNTER — Other Ambulatory Visit: Payer: Self-pay

## 2019-05-07 DIAGNOSIS — E8881 Metabolic syndrome: Secondary | ICD-10-CM

## 2019-05-07 DIAGNOSIS — Z6841 Body Mass Index (BMI) 40.0 and over, adult: Secondary | ICD-10-CM

## 2019-05-07 DIAGNOSIS — E559 Vitamin D deficiency, unspecified: Secondary | ICD-10-CM

## 2019-05-07 IMAGING — DX DG KNEE COMPLETE 4+V*R*
4 series · 4 of 4 positions shown · non-contrast
Comparison: None.

CLINICAL DATA: Right knee pain

EXAM:
RIGHT KNEE - COMPLETE 4+ VIEW

[knee ap]
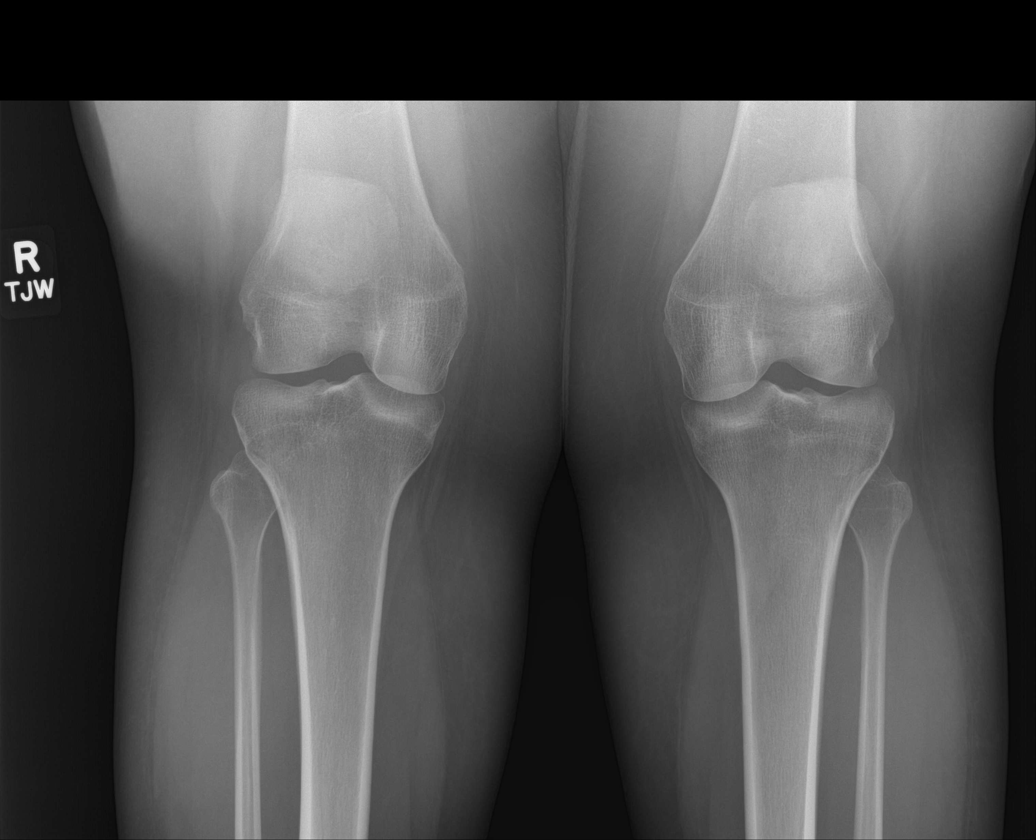

[knee tunnel]
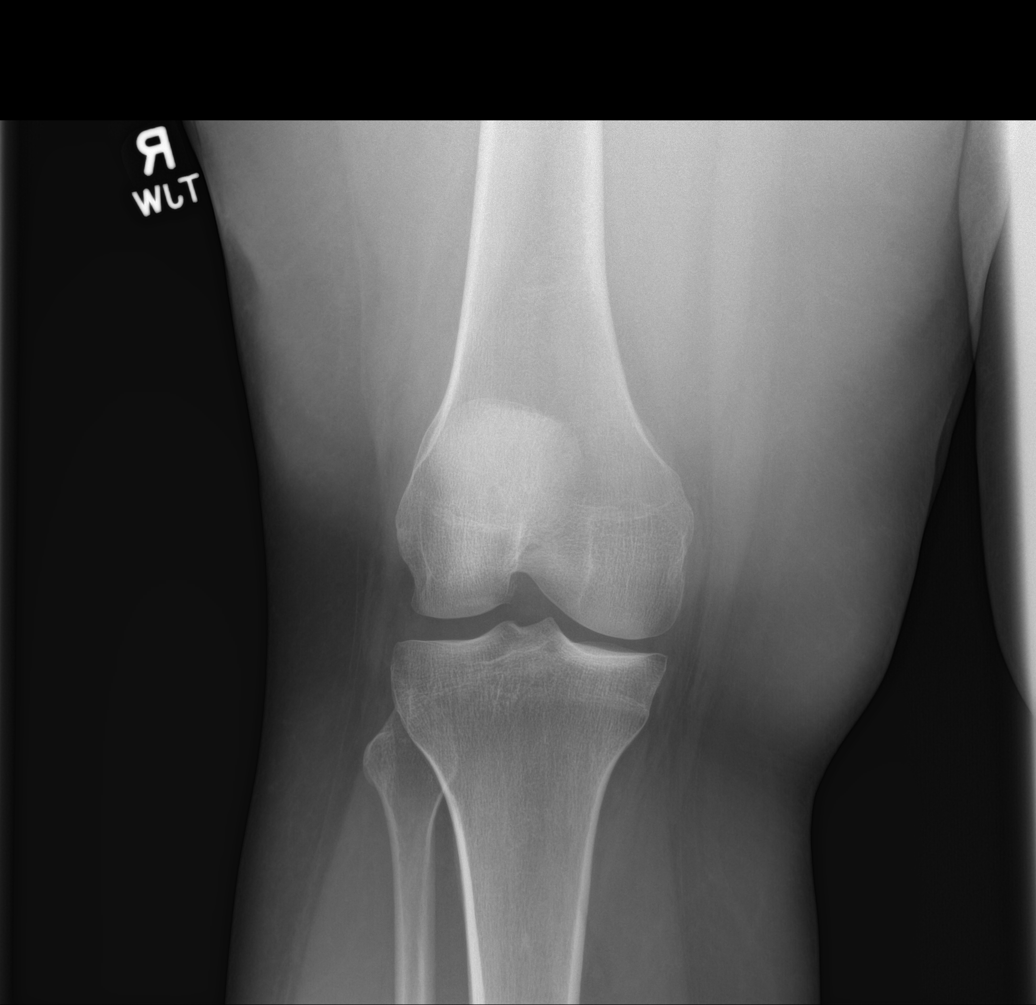

[knee lat]
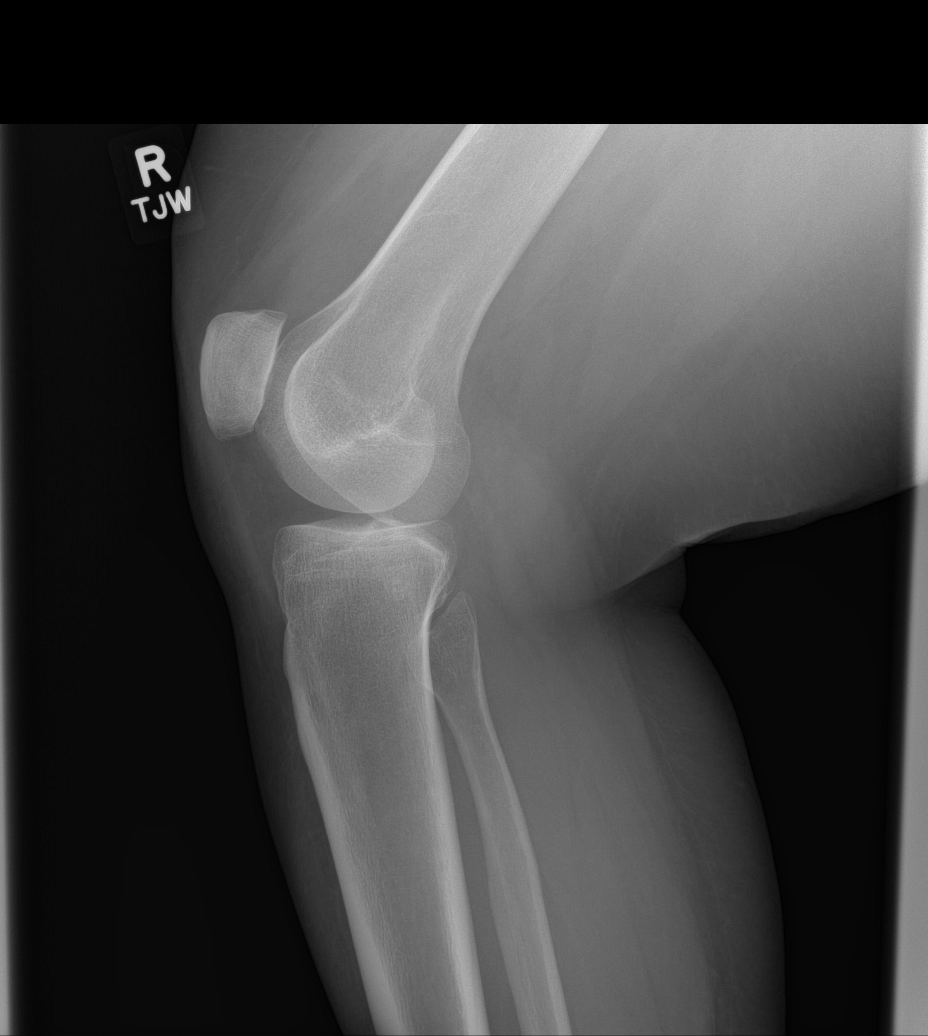

[patella skyline]
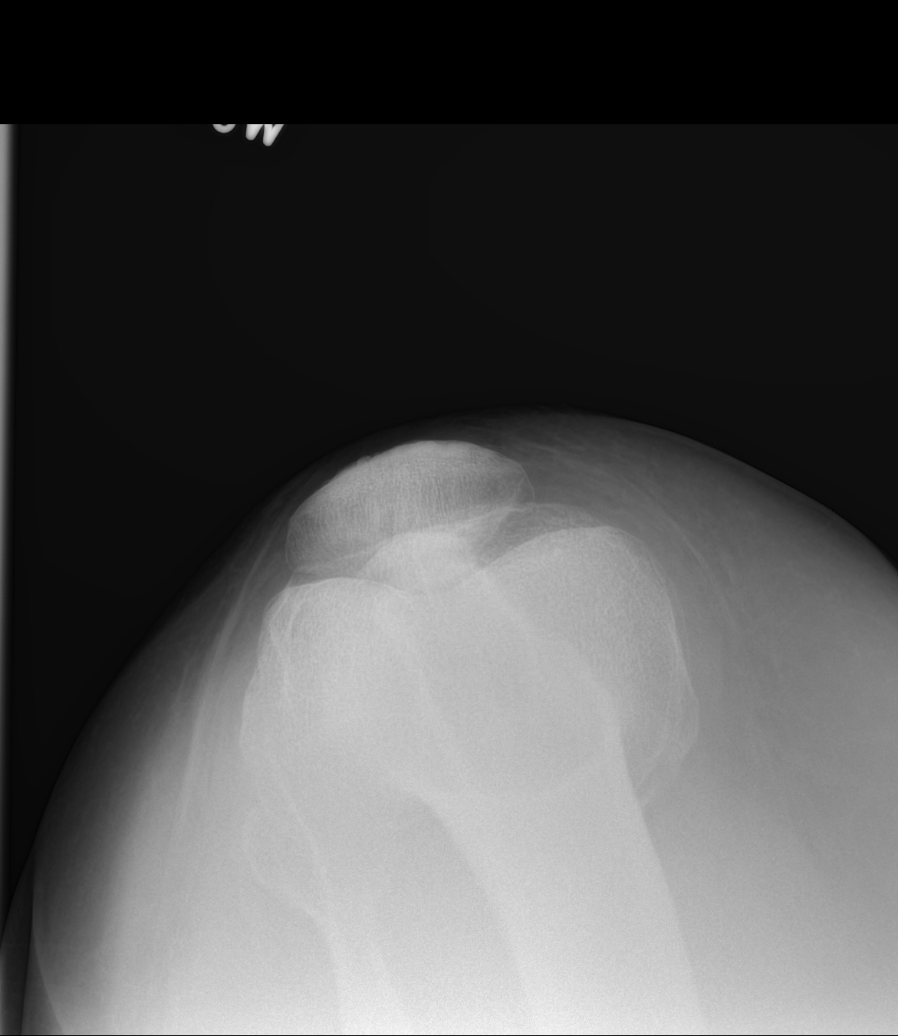

[4 of 4 positions shown; findings below may reference images not displayed]

FINDINGS: No evidence of fracture, dislocation, or joint effusion. No evidence
of arthropathy or other focal bone abnormality. Soft tissues are
unremarkable. AP view of the left knee submitted for comparison,
unremarkable.
IMPRESSION: Negative.

## 2019-05-07 MED ORDER — VITAMIN D (ERGOCALCIFEROL) 1.25 MG (50000 UNIT) PO CAPS: 50000.0000 [IU] | ORAL_CAPSULE | ORAL | 0 refills | Status: DC

## 2019-05-09 NOTE — Progress Notes (Signed)
Office: [phone]  /  Fax: [phone] TeleHealth Visit:  Brittney [name] has verbally consented to this TeleHealth visit today. The patient is located at home, the provider is located at the [facility] office. The participants in this visit include the listed provider and patient. The visit was conducted today via webex.  HPI:   Chief Complaint: OBESITY Brittney [name] is here to discuss her progress with her obesity treatment plan. She is on the Category 3 plan and is following her eating plan approximately 50 % of the time. She states she is walking, or riding the bike for 3 miles, or on the elliptical for 30 minutes 5 times per week. Brittney [name] reported a weight at 267 lbs this week. She is doing a mix of Category 3 and intermittent fasting. She thinks she will probably do a combination of intermittent fasting and Category 3.  We were unable to weigh the patient today for this TeleHealth visit. She feels as if she has lost 9 lbs since her last visit. She has lost 0 lbs since starting treatment with [place].  Vitamin D Deficiency Brittney [name] has a diagnosis of vitamin D deficiency. She is currently taking prescription Vit D. She notes fatigue and denies nausea, vomiting or muscle weakness.  Insulin Resistance Brittney [name] has a diagnosis of insulin resistance based on her elevated fasting insulin level >5. Although Brittney [name]'s blood glucose readings are still under good control, insulin resistance puts her at greater risk of metabolic syndrome and diabetes. She notes occasional minimal carbohydrate cravings, and denies GI side effects of metformin. She continues to work on diet and exercise to decrease risk of diabetes.  ASSESSMENT AND PLAN:  Insulin resistance  Vitamin D deficiency - Plan: Vitamin D, Ergocalciferol, (DRISDOL) 1.25 MG (50000 UT) CAPS capsule  Class 3 severe obesity with serious comorbidity and body mass index (BMI) of 40.0 to 44.9 in adult, unspecified obesity type (HCC)  PLAN:  Vitamin  D Deficiency Brittney [name] was informed that low vitamin D levels contributes to fatigue and are associated with obesity, breast, and colon cancer. Brittney [name] agrees to continue taking prescription Vit D 50,000 IU every week #4 and we will refill for 1 month. She will follow up for routine testing of vitamin D, at least 2-3 times per year. She was informed of the risk of over-replacement of vitamin D and agrees to not increase her dose unless she discusses this with [place] first. Brittney [name] agrees to follow up with our clinic in 2 weeks.  Insulin Resistance Brittney [name] will continue to work on weight loss, exercise, and decreasing simple carbohydrates in her diet to help decrease the risk of diabetes. We dicussed metformin including benefits and risks. She was informed that eating too many simple carbohydrates or too many calories at one sitting increases the likelihood of GI side effects. Brittney [name] agrees to continue taking metformin, and she agrees to follow up with our clinic in 2 weeks as directed to monitor her progress.  Obesity Brittney [name] is currently in the action stage of change. As such, her goal is to continue with weight loss efforts She has agreed to follow the Category 3 plan Brittney [name] has been instructed to work up to a goal of 150 minutes of combined cardio and strengthening exercise per week for weight loss and overall health benefits. We discussed the following Behavioral Modification Strategies today: increasing lean protein intake, increasing vegetables and work on meal planning and easy cooking plans, keeping healthy foods in the home, and planning for success  Brittney [name] has agreed to follow up with our clinic in 2 weeks. She was informed of the importance of frequent follow up visits to maximize her success with intensive lifestyle modifications for her multiple health conditions.  ALLERGIES: Allergies  Allergen Reactions  . Prevacid [Lansoprazole] Rash    MEDICATIONS: Current Outpatient Medications on File Prior to Visit   Medication Sig Dispense Refill  . Cholecalciferol (VITAMIN D3) 125 MCG (5000 UT) CAPS Take 5,000 Units by mouth.    . fluticasone (FLONASE) 50 MCG/ACT nasal spray Place 2 sprays into both nostrils daily as needed (for allergies.). 48 g 3  . ibuprofen (ADVIL,MOTRIN) 600 MG tablet Take 1 tablet (600 mg total) by mouth every 8 (eight) hours as needed for fever, headache, mild pain or moderate pain. With food 45 tablet 1  . levocetirizine (XYZAL) 5 MG tablet Take 1 tablet (5 mg total) by mouth every evening. 90 tablet 3  . metFORMIN (GLUCOPHAGE) 500 MG tablet Take 1 tablet (500 mg total) by mouth daily with breakfast. 30 tablet 0  . montelukast (SINGULAIR) 10 MG tablet Take 1 tablet (10 mg total) by mouth at bedtime. 90 tablet 3  . triamcinolone cream (KENALOG) 0.1 % Apply 1 application topically 2 (two) times daily as needed (for Eczema).    . vitamin B-12 (CYANOCOBALAMIN) 1000 MCG tablet Take 1,000 mcg by mouth daily.    . Naltrexone-buPROPion HCl ER 8-90 MG TB12 Take 2 tablets by mouth 2 (two) times daily. (Patient not taking: Reported on 05/07/2019) 120 tablet 2   No current facility-administered medications on file prior to visit.     PAST MEDICAL HISTORY: Past Medical History:  Diagnosis Date  . Anemia   . B12 deficiency   . Back pain   . Colon polyps   . Coronary artery abnormality    spasms   . 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 reports today has not had any spasms or chest pains in over 2 years    . Fibroids   . Gallstones   . Irritable bowel syndrome   . Joint pain   . Lactose intolerance   . Migraine    hasnt had one in a long time  . Obesity, unspecified   . Vitamin D deficiency     PAST SURGICAL HISTORY: Past Surgical History:  Procedure Laterality Date  . CHOLECYSTECTOMY  05/06/11  . COLONOSCOPY  11/2001   polyp; negative  pathology  . COLONOSCOPY  09/2004   Negative  . COLONOSCOPY  2017 or 2018 patient  unsure   [facility] ;   . COLONOSCOPY W/ POLYPECTOMY    . CYSTOSCOPY N/A 01/03/2018   Procedure: CYSTOSCOPY;  Surgeon: Bobbye Charleston, MD;  Location: [facility];  Service: Gynecology;  Laterality: N/A;  . ESOPHAGOGASTRODUODENOSCOPY  1/10   normal  . FLEXIBLE SIGMOIDOSCOPY  03/05/2012   Procedure: FLEXIBLE SIGMOIDOSCOPY;  Surgeon: Inda Castle, MD;  Location: [facility];  Service: Endoscopy;  Laterality: N/A;  . LAPAROSCOPIC DECORTICATION / DUBULKING / ABLATION RENAL CYSTS     saline histogram  . ROBOTIC ASSISTED LAPAROSCOPIC HYSTERECTOMY AND SALPINGECTOMY Bilateral 01/03/2018   Procedure: XI ROBOTIC ASSISTED LAPAROSCOPIC HYSTERECTOMY AND SALPINGECTOMY;  Surgeon: Bobbye Charleston, MD;  Location: [facility];  Service: Gynecology;  Laterality: Bilateral;  WITH BED AFTER  . TONSILLECTOMY  07/30/07  . Uterine US  09/2005  Large endometrial stripe; small ovarian cyst    SOCIAL HISTORY: Social History   Tobacco Use  . Smoking status: Never Smoker  . Smokeless tobacco: Never Used  Substance Use Topics  . Alcohol use: No    Alcohol/week: 0.0 standard drinks  . Drug use: No    FAMILY HISTORY: Family History  Problem Relation Age of Onset  . Heart disease Father   . Hypertension Father   . Heart attack Father   . Diabetes Father   . Sleep apnea Mother   . Obesity Mother   . Ulcers Brother   . Clotting disorder Brother   . Ovarian cancer Maternal Grandmother   . Colon polyps Maternal Grandmother   . Colon cancer Maternal Grandmother        40's  . Diabetes Paternal Grandmother   . Hypertension Paternal Grandmother   . Breast cancer Maternal Aunt        Age 39's  . Diabetes Paternal Aunt        x 5  . Lung cancer Maternal Grandfather        SMOKER    ROS: Review of Systems  Constitutional: Positive for malaise/fatigue and weight loss.  Gastrointestinal: Negative for nausea and vomiting.   Musculoskeletal:       Negative muscle weakness    PHYSICAL EXAM: Pt in no acute distress  RECENT LABS AND TESTS: BMET    Component Value Date/Time   NA 138 02/21/2019 0752   NA 140 07/02/2014 1253   K 4.4 02/21/2019 0752   K 4.2 07/02/2014 1253   CL 104 02/21/2019 0752   CL 107 07/02/2014 1253   CO2 27 02/21/2019 0752   CO2 31 07/02/2014 1253   GLUCOSE 92 02/21/2019 0752   GLUCOSE 89 07/02/2014 1253   BUN 12 02/21/2019 0752   BUN 10 07/02/2014 1253   CREATININE 1.03 02/21/2019 0752   CREATININE 1.07 07/02/2014 1253   CREATININE 0.82 09/02/2013 1250   CALCIUM 9.0 02/21/2019 0752   CALCIUM 8.7 07/02/2014 1253   GFRNONAA >60 09/16/2016 1937   GFRNONAA >60 07/02/2014 1253   GFRNONAA >60 07/05/2013 1025   GFRAA >60 09/16/2016 1937   GFRAA >60 07/02/2014 1253   GFRAA >60 07/05/2013 1025   Lab Results  Component Value Date   HGBA1C 4.4 02/21/2019   HGBA1C 5.8 02/21/2019   Lab Results  Component Value Date   INSULIN 10.8 03/07/2019   CBC    Component Value Date/Time   WBC 8.2 02/21/2019 0752   RBC 5.10 02/21/2019 0752   HGB 13.6 02/21/2019 0752   HGB 11.4 (L) 07/02/2014 1253   HCT 42.8 02/21/2019 0752   HCT 36.1 07/02/2014 1253   PLT 272.0 02/21/2019 0752   PLT 260 07/02/2014 1253   MCV 84.1 02/21/2019 0752   MCV 81 07/02/2014 1253   MCH 24.6 (L) 12/22/2017 0850   MCHC 31.8 02/21/2019 0752   RDW 16.0 (H) 02/21/2019 0752   RDW 16.9 (H) 07/02/2014 1253   LYMPHSABS 2.6 02/21/2019 0752   LYMPHSABS 1.3 02/19/2012 0455   MONOABS 0.5 02/21/2019 0752   MONOABS 0.5 02/19/2012 0455   EOSABS 0.1 02/21/2019 0752   EOSABS 0.1 02/19/2012 0455   BASOSABS 0.1 02/21/2019 0752   BASOSABS 0.0 02/19/2012 0455   Iron/TIBC/Ferritin/ %Sat    Component Value Date/Time   IRON 152 (H) 09/20/2016 1428   FERRITIN 6.7 (L) 09/20/2016 1428   IRONPCTSAT 32.6 09/20/2016 1428   Lipid Panel     Component Value  Date/Time   CHOL 176 03/07/2019 0957   TRIG 68 03/07/2019 0957    HDL 46 03/07/2019 0957   CHOLHDL 5 09/18/2018 1624   VLDL 13.4 09/18/2018 1624   LDLCALC 116 (H) 03/07/2019 0957   Hepatic Function Panel     Component Value Date/Time   PROT 6.7 02/21/2019 0752   PROT 7.0 07/02/2014 1253   ALBUMIN 4.0 02/21/2019 0752   ALBUMIN 3.5 07/02/2014 1253   AST 14 02/21/2019 0752   AST 26 07/02/2014 1253   ALT 14 02/21/2019 0752   ALT 25 07/02/2014 1253   ALKPHOS 71 02/21/2019 0752   ALKPHOS 93 07/02/2014 1253   BILITOT 0.5 02/21/2019 0752   BILITOT 0.4 07/02/2014 1253   BILIDIR 0.1 07/23/2008 1707      Component Value Date/Time   TSH 2.44 02/21/2019 0752   TSH 1.16 09/18/2018 1624   TSH 1.76 08/29/2017 1255      I, Trixie Dredge, am acting as transcriptionist for Ilene Qua, MD  I have reviewed the above documentation for accuracy and completeness, and I agree with the above. - Ilene Qua, MD

## 2019-05-16 ENCOUNTER — Encounter: Payer: Self-pay | Admitting: Family Medicine

## 2019-05-16 ENCOUNTER — Ambulatory Visit (INDEPENDENT_AMBULATORY_CARE_PROVIDER_SITE_OTHER): Payer: BC Managed Care – PPO | Admitting: Family Medicine

## 2019-05-16 DIAGNOSIS — I201 Angina pectoris with documented spasm: Secondary | ICD-10-CM

## 2019-05-16 DIAGNOSIS — H6592 Unspecified nonsuppurative otitis media, left ear: Secondary | ICD-10-CM | POA: Insufficient documentation

## 2019-05-16 MED ORDER — FLUCONAZOLE 150 MG PO TABS: 150.0000 mg | ORAL_TABLET | Freq: Once | ORAL | 0 refills | Status: AC

## 2019-05-16 MED ORDER — AMOXICILLIN-POT CLAVULANATE 875-125 MG PO TABS: 1.0000 | ORAL_TABLET | Freq: Two times a day (BID) | ORAL | 0 refills | Status: DC

## 2019-05-16 NOTE — Patient Instructions (Signed)
Drink fluids and continue flonase Ibuprofen is ok for ear pain  Watch for fever/ear drainage or no improvement  Keep [place] posted Update if not starting to improve in a week or if worsening    Also let us know if any covid symptoms (fever/cough/shortness of breath/ change in taste or smell/or GI symptoms)

## 2019-05-16 NOTE — Progress Notes (Signed)
Virtual Visit via Video Note  I connected with Brittney [name] on 05/16/19 at 11:15 AM EDT by a video enabled telemedicine application and verified that I am speaking with the correct person using two identifiers.  Location: Patient: home Provider: office    I discussed the limitations of evaluation and management by telemedicine and the availability of in person appointments. The patient expressed understanding and agreed to proceed.  History of Present Illness: Pt presents for ear symptoms  L ear discomfort   Thinks she has infection  Started yesterday am  Feels full-then started hurting  Using flonase to move congestion   Popping ear quite a lot  No drainage  No tinnitus or hearing loss   No fever or aches or chills   No rash anywhere   Monday was a little dizzy  Not now   No cough or cold symptoms  Allergies are not too bad  No colored nasal mucous   Otc: took px ibuprofen for pain   Has never had ear tubes or ENT procedures for her ETD     Patient Active Problem List   Diagnosis Date Noted  . OME (otitis media with effusion), left 05/16/2019  . Mild reactive airways disease 03/20/2019  . Hot flashes 02/20/2019  . Constipation 02/20/2019  . Fatigue 02/20/2019  . Right knee pain 10/17/2018  . Benign paroxysmal positional vertigo 05/16/2018  . Postoperative state 01/03/2018  . Lipoma of back 10/04/2017  . Routine general medical examination at a health care facility 08/29/2017  . Fibrocystic breast changes 01/08/2016  . Hemorrhoids, external 09/30/2014  . Chronic diarrhea 08/29/2014  . 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 Hemorrhoids, internal, with bleeding 04/22/2014  . Family history of pulmonary embolism 09/02/2013  . Family history of diabetes mellitus 09/02/2013  . Cutaneous skin tags 05/15/2013  . 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 Morbid obesity ([place]) 03/31/2008  . HEARING LOSS, MILD 03/31/2008  . MIGRAINE HEADACHE 02/20/2007   . IBS 02/20/2007  . OSTEOARTHRITIS 02/20/2007   Past Medical History:  Diagnosis Date  . Anemia   . B12 deficiency   . Back pain   . Colon polyps   . Coronary artery abnormality    spasms   . 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 reports today has not had any spasms or chest pains in over 2 years    . Fibroids   . Gallstones   . Irritable bowel syndrome   . Joint pain   . Lactose intolerance   . Migraine    hasnt had one in a long time  . Obesity, unspecified   . Vitamin D deficiency    Past Surgical History:  Procedure Laterality Date  . CHOLECYSTECTOMY  05/06/11  . COLONOSCOPY  11/2001   polyp; negative pathology  . COLONOSCOPY  09/2004   Negative  . COLONOSCOPY  2017 or 2018 patient  unsure   [facility] ;   . COLONOSCOPY W/ POLYPECTOMY    . CYSTOSCOPY N/A 01/03/2018   Procedure: CYSTOSCOPY;  Surgeon: Bobbye Charleston, MD;  Location: [facility];  Service: Gynecology;  Laterality: N/A;  . ESOPHAGOGASTRODUODENOSCOPY  1/10   normal  . FLEXIBLE SIGMOIDOSCOPY  03/05/2012   Procedure: FLEXIBLE SIGMOIDOSCOPY;  Surgeon: Inda Castle, MD;  Location[facility];  Service: Endoscopy;  Laterality: N/A;  . LAPAROSCOPIC  DECORTICATION / DUBULKING / ABLATION RENAL CYSTS     saline histogram  . ROBOTIC ASSISTED LAPAROSCOPIC HYSTERECTOMY AND SALPINGECTOMY Bilateral 01/03/2018   Procedure: XI ROBOTIC ASSISTED LAPAROSCOPIC HYSTERECTOMY AND SALPINGECTOMY;  Surgeon: Bobbye Charleston, MD;  Location: [facility];  Service: Gynecology;  Laterality: Bilateral;  WITH BED AFTER  . TONSILLECTOMY  07/30/07  . Uterine US  09/2005   Large endometrial stripe; small ovarian cyst   Social History   Tobacco Use  . Smoking status: Never Smoker  . Smokeless tobacco: Never Used  Substance Use Topics  . Alcohol use: No    Alcohol/week: 0.0 standard drinks  . Drug  use: No   Family History  Problem Relation Age of Onset  . Heart disease Father   . Hypertension Father   . Heart attack Father   . Diabetes Father   . Sleep apnea Mother   . Obesity Mother   . Ulcers Brother   . Clotting disorder Brother   . Ovarian cancer Maternal Grandmother   . Colon polyps Maternal Grandmother   . Colon cancer Maternal Grandmother        40's  . Diabetes Paternal Grandmother   . Hypertension Paternal Grandmother   . Breast cancer Maternal Aunt        Age 49's  . Diabetes Paternal Aunt        x 5  . Lung cancer Maternal Grandfather        SMOKER   Allergies  Allergen Reactions  . Prevacid [Lansoprazole] Rash   Current Outpatient Medications on File Prior to Visit  Medication Sig Dispense Refill  . fluticasone (FLONASE) 50 MCG/ACT nasal spray Place 2 sprays into both nostrils daily as needed (for allergies.). 48 g 3  . ibuprofen (ADVIL,MOTRIN) 600 MG tablet Take 1 tablet (600 mg total) by mouth every 8 (eight) hours as needed for fever, headache, mild pain or moderate pain. With food 45 tablet 1  . levocetirizine (XYZAL) 5 MG tablet Take 1 tablet (5 mg total) by mouth every evening. 90 tablet 3  . montelukast (SINGULAIR) 10 MG tablet Take 1 tablet (10 mg total) by mouth at bedtime. 90 tablet 3  . triamcinolone cream (KENALOG) 0.1 % Apply 1 application topically 2 (two) times daily as needed (for Eczema).    . vitamin B-12 (CYANOCOBALAMIN) 1000 MCG tablet Take 1,000 mcg by mouth daily.    . Vitamin D, Ergocalciferol, (DRISDOL) 1.25 MG (50000 UT) CAPS capsule Take 1 capsule (50,000 Units total) by mouth every 7 (seven) days. 4 capsule 0  . metFORMIN (GLUCOPHAGE) 500 MG tablet Take 1 tablet (500 mg total) by mouth daily with breakfast. (Patient not taking: Reported on 05/16/2019) 30 tablet 0   No current facility-administered medications on file prior to visit.     Review of Systems  Constitutional: Negative for chills, diaphoresis, fever and  malaise/fatigue.  HENT: Positive for ear pain. Negative for congestion, ear discharge, hearing loss, sinus pain, sore throat and tinnitus.   Eyes: Negative for discharge and redness.  Respiratory: Negative for cough and shortness of breath.   Cardiovascular: Negative for chest pain and palpitations.  Gastrointestinal: Negative for abdominal pain, diarrhea, nausea and vomiting.  Musculoskeletal: Negative for myalgias.  Skin: Negative for itching and rash.  Neurological: Negative for dizziness and headaches.    Observations/Objective: Patient appears well, in no distress Weight is baseline (obese) No facial swelling or asymmetry Normal voice-not hoarse and no slurred speech No obvious tremor or mobility impairment Moving neck and  UEs normally Able to hear the call well  No cough or shortness of breath during interview  Not sniffling or sneezing  Talkative and mentally sharp with no cognitive changes No skin changes on face or neck , no rash or pallor Affect is normal    Assessment and Plan: Problem List Items Addressed This Visit      Nervous and Auditory   OME (otitis media with effusion), left    Suspected L OM in pt with h/o frequent infections Enc to continue flonase (she is good about this) Ibuprofen for discomfort Watch for fever or ear drainage Watch for symptoms of covid (rev)  Sent augmentin (and diflucan for vag yeast inf she usually gets with abx) to her pharmacy Update if not starting to improve in a week or if worsening        Relevant Medications   amoxicillin-clavulanate (AUGMENTIN) 875-125 MG tablet   fluconazole (DIFLUCAN) 150 MG tablet       Follow Up Instructions: Drink fluids and continue flonase Ibuprofen is ok for ear pain  Watch for fever/ear drainage or no improvement  Keep [place] posted Update if not starting to improve in a week or if worsening    Also let us know if any covid symptoms (fever/cough/shortness of breath/ change in taste or  smell/or GI symptoms)     I discussed the assessment and treatment plan with the patient. The patient was provided an opportunity to ask questions and all were answered. The patient agreed with the plan and demonstrated an understanding of the instructions.   The patient was advised to call back or seek an in-person evaluation if the symptoms worsen or if the condition fails to improve as anticipated.     Loura Pardon, MD

## 2019-05-16 NOTE — Assessment & Plan Note (Signed)
Suspected L OM in pt with h/o frequent infections Enc to continue flonase (she is good about this) Ibuprofen for discomfort Watch for fever or ear drainage Watch for symptoms of covid (rev)  Sent augmentin (and diflucan for vag yeast inf she usually gets with abx) to her pharmacy Update if not starting to improve in a week or if worsening

## 2019-05-16 NOTE — Assessment & Plan Note (Signed)
No changes clinically  

## 2019-05-20 ENCOUNTER — Encounter (INDEPENDENT_AMBULATORY_CARE_PROVIDER_SITE_OTHER): Payer: Self-pay

## 2019-05-22 ENCOUNTER — Telehealth (INDEPENDENT_AMBULATORY_CARE_PROVIDER_SITE_OTHER): Payer: BC Managed Care – PPO | Admitting: Family Medicine

## 2019-05-22 ENCOUNTER — Other Ambulatory Visit: Payer: Self-pay

## 2019-05-22 DIAGNOSIS — R7303 Prediabetes: Secondary | ICD-10-CM

## 2019-05-22 DIAGNOSIS — E559 Vitamin D deficiency, unspecified: Secondary | ICD-10-CM | POA: Diagnosis not present

## 2019-05-22 DIAGNOSIS — Z6841 Body Mass Index (BMI) 40.0 and over, adult: Secondary | ICD-10-CM | POA: Diagnosis not present

## 2019-05-23 NOTE — Progress Notes (Signed)
Office: [phone]  /  Fax: [phone] TeleHealth Visit:  CARLITA MCPHAIL has verbally consented to this TeleHealth visit today. The patient is located at home, the provider is located at the [employer] [facility] office. The participants in this visit include the listed provider and patient. The visit was conducted today via webex.  HPI:   Chief Complaint: OBESITY Alleyah is here to discuss her progress with her obesity treatment plan. She is on the Category 3 plan and is following her eating plan approximately 75 % of the time. She states she is walking 3 miles 5 times per week, riding the bike 4-5 miles 2 times per week, and hit workout for 30 minutes 4-5 times per week. Rilee's weight is reported the same as last appointment. She notes stress from work, kids to come back starting October 28th. She is doing intermittent fasting but is getting in all calories in. She denies hunger. She is still following Category 3.  We were unable to weigh the patient today for this TeleHealth visit. She feels as if she has maintained her weight since her last visit. She has lost 0 lbs since starting treatment with [place].  Pre-Diabetes Kendi has a diagnosis of pre-diabetes based on her elevated Hgb A1c and was informed this puts her at greater risk of developing diabetes. She denies GI side effects when taking metformin, and she is only taking it sporadically. She continues to work on diet and exercise to decrease risk of diabetes. She denies hypoglycemia.  Vitamin D Deficiency Kerie has a diagnosis of vitamin D deficiency. She is currently taking prescription Vit D. She notes fatigue and denies nausea, vomiting or muscle weakness.  ASSESSMENT AND PLAN:  Vitamin D deficiency  Prediabetes  Class 3 severe obesity with serious comorbidity and body mass index (BMI) of 40.0 to 44.9 in adult, unspecified obesity type ([place])  PLAN:  Pre-Diabetes Cordella will continue to work on weight loss, exercise, and  decreasing simple carbohydrates in her diet to help decrease the risk of diabetes. We dicussed metformin including benefits and risks. She was informed that eating too many simple carbohydrates or too many calories at one sitting increases the likelihood of GI side effects. Truly was encouraged to take her metformin daily. Skyeler agrees to follow up with our clinic in 2 weeks as directed to monitor her progress.  Vitamin D Deficiency Sarahgrace was informed that low vitamin D levels contributes to fatigue and are associated with obesity, breast, and colon cancer. Marnita agrees to continue taking prescription Vit D 50,000 IU every week and will follow up for routine testing of vitamin D, at least 2-3 times per year. She was informed of the risk of over-replacement of vitamin D and agrees to not increase her dose unless she discusses this with [place] first. Samanthia agrees to follow up with our clinic in 2 weeks.  Obesity Shari is currently in the action stage of change. As such, her goal is to continue with weight loss efforts She has agreed to follow the Category 3 plan Lucee has been instructed to work up to a goal of 150 minutes of combined cardio and strengthening exercise per week for weight loss and overall health benefits. We discussed the following Behavioral Modification Strategies today: increasing lean protein intake, increasing vegetables and work on meal planning and easy cooking plans, keeping healthy foods in the home, and planning for success   Shailynn has agreed to follow up with our clinic in 2 weeks with  Dr. Owens Shark. She was informed of the importance of frequent follow up visits to maximize her success with intensive lifestyle modifications for her multiple health conditions.  ALLERGIES: Allergies  Allergen Reactions  . Prevacid [Lansoprazole] Rash    MEDICATIONS: Current Outpatient Medications on File Prior to Visit  Medication Sig Dispense Refill  . fluticasone (FLONASE) 50 MCG/ACT nasal spray Place 2  sprays into both nostrils daily as needed (for allergies.). 48 g 3  . ibuprofen (ADVIL,MOTRIN) 600 MG tablet Take 1 tablet (600 mg total) by mouth every 8 (eight) hours as needed for fever, headache, mild pain or moderate pain. With food 45 tablet 1  . levocetirizine (XYZAL) 5 MG tablet Take 1 tablet (5 mg total) by mouth every evening. 90 tablet 3  . metFORMIN (GLUCOPHAGE) 500 MG tablet Take 1 tablet (500 mg total) by mouth daily with breakfast. 30 tablet 0  . montelukast (SINGULAIR) 10 MG tablet Take 1 tablet (10 mg total) by mouth at bedtime. 90 tablet 3  . triamcinolone cream (KENALOG) 0.1 % Apply 1 application topically 2 (two) times daily as needed (for Eczema).    . vitamin B-12 (CYANOCOBALAMIN) 1000 MCG tablet Take 1,000 mcg by mouth daily.    . Vitamin D, Ergocalciferol, (DRISDOL) 1.25 MG (50000 UT) CAPS capsule Take 1 capsule (50,000 Units total) by mouth every 7 (seven) days. 4 capsule 0   No current facility-administered medications on file prior to visit.     PAST MEDICAL HISTORY: Past Medical History:  Diagnosis Date  . Anemia   . B12 deficiency   . Back pain   . Colon polyps   . Coronary artery abnormality    spasms   . 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 reports today has not had any spasms or chest pains in over 2 years    . Fibroids   . Gallstones   . Irritable bowel syndrome   . Joint pain   . Lactose intolerance   . Migraine    hasnt had one in a long time  . Obesity, unspecified   . Vitamin D deficiency     PAST SURGICAL HISTORY: Past Surgical History:  Procedure Laterality Date  . CHOLECYSTECTOMY  05/06/11  . COLONOSCOPY  11/2001   polyp; negative pathology  . COLONOSCOPY  09/2004   Negative  . COLONOSCOPY  2017 or 2018 patient  unsure   [facility] ;   . COLONOSCOPY W/ POLYPECTOMY    . CYSTOSCOPY N/A  01/03/2018   Procedure: CYSTOSCOPY;  Surgeon: Bobbye Charleston, MD;  Location: [facility];  Service: Gynecology;  Laterality: N/A;  . ESOPHAGOGASTRODUODENOSCOPY  1/10   normal  . FLEXIBLE SIGMOIDOSCOPY  03/05/2012   Procedure: FLEXIBLE SIGMOIDOSCOPY;  Surgeon: Inda Castle, MD;  Location: [facility];  Service: Endoscopy;  Laterality: N/A;  . LAPAROSCOPIC DECORTICATION / DUBULKING / ABLATION RENAL CYSTS     saline histogram  . ROBOTIC ASSISTED LAPAROSCOPIC HYSTERECTOMY AND SALPINGECTOMY Bilateral 01/03/2018   Procedure: XI ROBOTIC ASSISTED LAPAROSCOPIC HYSTERECTOMY AND SALPINGECTOMY;  Surgeon: Bobbye Charleston, MD;  Location: [facility];  Service: Gynecology;  Laterality: Bilateral;  WITH BED AFTER  . TONSILLECTOMY  07/30/07  . Uterine US  09/2005   Large endometrial stripe; small ovarian cyst    SOCIAL HISTORY: Social History   Tobacco Use  . Smoking status: Never Smoker  .  Smokeless tobacco: Never Used  Substance Use Topics  . Alcohol use: No    Alcohol/week: 0.0 standard drinks  . Drug use: No    FAMILY HISTORY: Family History  Problem Relation Age of Onset  . Heart disease Father   . Hypertension Father   . Heart attack Father   . Diabetes Father   . Sleep apnea Mother   . Obesity Mother   . Ulcers Brother   . Clotting disorder Brother   . Ovarian cancer Maternal Grandmother   . Colon polyps Maternal Grandmother   . Colon cancer Maternal Grandmother        40's  . Diabetes Paternal Grandmother   . Hypertension Paternal Grandmother   . Breast cancer Maternal Aunt        Age 32's  . Diabetes Paternal Aunt        x 5  . Lung cancer Maternal Grandfather        SMOKER    ROS: Review of Systems  Constitutional: Positive for malaise/fatigue. Negative for weight loss.  Gastrointestinal: Negative for nausea and vomiting.  Musculoskeletal:       Negative muscle ewakness  Endo/Heme/Allergies:       Negative hypoglycemia    PHYSICAL EXAM: Pt in no acute distress   RECENT LABS AND TESTS: BMET    Component Value Date/Time   NA 138 02/21/2019 0752   NA 140 07/02/2014 1253   K 4.4 02/21/2019 0752   K 4.2 07/02/2014 1253   CL 104 02/21/2019 0752   CL 107 07/02/2014 1253   CO2 27 02/21/2019 0752   CO2 31 07/02/2014 1253   GLUCOSE 92 02/21/2019 0752   GLUCOSE 89 07/02/2014 1253   BUN 12 02/21/2019 0752   BUN 10 07/02/2014 1253   CREATININE 1.03 02/21/2019 0752   CREATININE 1.07 07/02/2014 1253   CREATININE 0.82 09/02/2013 1250   CALCIUM 9.0 02/21/2019 0752   CALCIUM 8.7 07/02/2014 1253   GFRNONAA >60 09/16/2016 1937   GFRNONAA >60 07/02/2014 1253   GFRNONAA >60 07/05/2013 1025   GFRAA >60 09/16/2016 1937   GFRAA >60 07/02/2014 1253   GFRAA >60 07/05/2013 1025   Lab Results  Component Value Date   HGBA1C 4.4 02/21/2019   HGBA1C 5.8 02/21/2019   Lab Results  Component Value Date   INSULIN 10.8 03/07/2019   CBC    Component Value Date/Time   WBC 8.2 02/21/2019 0752   RBC 5.10 02/21/2019 0752   HGB 13.6 02/21/2019 0752   HGB 11.4 (L) 07/02/2014 1253   HCT 42.8 02/21/2019 0752   HCT 36.1 07/02/2014 1253   PLT 272.0 02/21/2019 0752   PLT 260 07/02/2014 1253   MCV 84.1 02/21/2019 0752   MCV 81 07/02/2014 1253   MCH 24.6 (L) 12/22/2017 0850   MCHC 31.8 02/21/2019 0752   RDW 16.0 (H) 02/21/2019 0752   RDW 16.9 (H) 07/02/2014 1253   LYMPHSABS 2.6 02/21/2019 0752   LYMPHSABS 1.3 02/19/2012 0455   MONOABS 0.5 02/21/2019 0752   MONOABS 0.5 02/19/2012 0455   EOSABS 0.1 02/21/2019 0752   EOSABS 0.1 02/19/2012 0455   BASOSABS 0.1 02/21/2019 0752   BASOSABS 0.0 02/19/2012 0455   Iron/TIBC/Ferritin/ %Sat    Component Value Date/Time   IRON 152 (H) 09/20/2016 1428   FERRITIN 6.7 (L) 09/20/2016 1428   IRONPCTSAT 32.6 09/20/2016 1428   Lipid Panel     Component Value Date/Time   CHOL 176 03/07/2019 0957   TRIG 68 03/07/2019 0957  HDL 46 03/07/2019 0957   CHOLHDL 5 09/18/2018 1624   VLDL 13.4 09/18/2018 1624   LDLCALC 116 (H)  03/07/2019 0957   Hepatic Function Panel     Component Value Date/Time   PROT 6.7 02/21/2019 0752   PROT 7.0 07/02/2014 1253   ALBUMIN 4.0 02/21/2019 0752   ALBUMIN 3.5 07/02/2014 1253   AST 14 02/21/2019 0752   AST 26 07/02/2014 1253   ALT 14 02/21/2019 0752   ALT 25 07/02/2014 1253   ALKPHOS 71 02/21/2019 0752   ALKPHOS 93 07/02/2014 1253   BILITOT 0.5 02/21/2019 0752   BILITOT 0.4 07/02/2014 1253   BILIDIR 0.1 07/23/2008 1707      Component Value Date/Time   TSH 2.44 02/21/2019 0752   TSH 1.16 09/18/2018 1624   TSH 1.76 08/29/2017 1255      I, Trixie Dredge, am acting as transcriptionist for Ilene Qua, MD  I have reviewed the above documentation for accuracy and completeness, and I agree with the above. - Ilene Qua, MD

## 2019-05-28 ENCOUNTER — Telehealth: Payer: Self-pay | Admitting: Family Medicine

## 2019-05-28 ENCOUNTER — Encounter (INDEPENDENT_AMBULATORY_CARE_PROVIDER_SITE_OTHER): Payer: Self-pay | Admitting: Family Medicine

## 2019-05-28 MED ORDER — HYDROCORTISONE ACETATE 25 MG RE SUPP: 25.0000 mg | Freq: Two times a day (BID) | RECTAL | 1 refills | Status: DC | PRN

## 2019-05-28 NOTE — Telephone Encounter (Signed)
Patient called today stating she is having a Hemorid flare up, she wanted to know if there is anyway that a suppository could be called in for her.  Patient stated that she had these before and the suppository seemed to help.    C/B # [phone]

## 2019-05-28 NOTE — Telephone Encounter (Signed)
Pt notified Rx sent to pharmacy and verbalized understanding

## 2019-05-28 NOTE — Telephone Encounter (Signed)
I sent [facility] to [facility] if no improvement

## 2019-05-29 ENCOUNTER — Telehealth: Payer: Self-pay | Admitting: Family Medicine

## 2019-05-29 NOTE — Telephone Encounter (Signed)
Patient stated that her Husband Ladona Horns will be coming to the office today to pick up an accomodation letter that was filled out for her.  Letter is up front

## 2019-05-29 NOTE — Telephone Encounter (Signed)
Please advise 

## 2019-05-30 MED ORDER — SERTRALINE HCL 25 MG PO TABS: 25.0000 mg | ORAL_TABLET | Freq: Every day | ORAL | 0 refills | Status: DC

## 2019-06-05 ENCOUNTER — Other Ambulatory Visit (INDEPENDENT_AMBULATORY_CARE_PROVIDER_SITE_OTHER): Payer: Self-pay | Admitting: Family Medicine

## 2019-06-05 ENCOUNTER — Telehealth (INDEPENDENT_AMBULATORY_CARE_PROVIDER_SITE_OTHER): Payer: BC Managed Care – PPO | Admitting: Bariatrics

## 2019-06-05 ENCOUNTER — Encounter (INDEPENDENT_AMBULATORY_CARE_PROVIDER_SITE_OTHER): Payer: Self-pay | Admitting: Bariatrics

## 2019-06-05 ENCOUNTER — Other Ambulatory Visit: Payer: Self-pay

## 2019-06-05 DIAGNOSIS — E559 Vitamin D deficiency, unspecified: Secondary | ICD-10-CM

## 2019-06-05 DIAGNOSIS — E8881 Metabolic syndrome: Secondary | ICD-10-CM | POA: Diagnosis not present

## 2019-06-05 DIAGNOSIS — Z6841 Body Mass Index (BMI) 40.0 and over, adult: Secondary | ICD-10-CM | POA: Diagnosis not present

## 2019-06-05 MED ORDER — VITAMIN D (ERGOCALCIFEROL) 1.25 MG (50000 UNIT) PO CAPS: 50000.0000 [IU] | ORAL_CAPSULE | ORAL | 0 refills | Status: DC

## 2019-06-06 ENCOUNTER — Other Ambulatory Visit: Payer: Self-pay | Admitting: Family Medicine

## 2019-06-06 DIAGNOSIS — Z1231 Encounter for screening mammogram for malignant neoplasm of breast: Secondary | ICD-10-CM

## 2019-06-06 NOTE — Progress Notes (Signed)
Office: [phone]  /  Fax: [phone] TeleHealth Visit:  Brittney [name] has verbally consented to this TeleHealth visit today. The patient is located at home, the provider is located at the [employer] [facility] office. The participants in this visit include the listed provider, patient, and the patient's husband. The visit was conducted today via Webex.  HPI:   Chief Complaint: OBESITY Brittney [name] is here to discuss her progress with her obesity treatment plan. She is on the Category 3 plan and is following her eating plan approximately 50% of the time. She states she is walking 3 miles 30 minutes 7 days per week, HIIT 15 minutes 5 times per week, and lifting weights 15 minutes 5 times per week. [name] states that she has maintained her weight, but her clothes fit differently and she is losing inches. She has a new measurement tape to measure. She reports getting adequate protein and water intake. We were unable to weigh the patient today for this TeleHealth visit. She feels as if she has maintained her weight since her last visit. She has lost 0 lbs since starting treatment with [place].  Vitamin D deficiency Brittney [name] has a diagnosis of Vitamin D deficiency. Last Vitamin D 16.62 on 02/21/2019. She is currently taking prescription Vit D and denies nausea, vomiting or muscle weakness.  Insulin Resistance Brittney [name] has a diagnosis of insulin resistance based on her elevated fasting insulin level >5. Last A1c 4.4 on 02/21/2019 with an insulin of 10.8 on 03/07/2019. Although Brittney [name]'s blood glucose readings are still under good control, insulin resistance puts her at greater risk of metabolic syndrome and diabetes. Brittney [name] was given metformin but she did not start it. She denies polyphagia.  ASSESSMENT AND PLAN:  Vitamin D deficiency - Plan: Vitamin D, Ergocalciferol, (DRISDOL) 1.25 MG (50000 UT) CAPS capsule  Insulin resistance  Class 3 severe obesity with serious comorbidity and body mass index (BMI) of 40.0  to 44.9 in adult, unspecified obesity type ([place])  PLAN:  Vitamin D Deficiency Brittney [name] was informed that low Vitamin D levels contributes to fatigue and are associated with obesity, breast, and colon cancer. She agrees to continue to take prescription Vit D @ 50,000 IU every week #4 with 0 refills and will follow-up for routine testing of Vitamin D, at least 2-3 times per year. She was informed of the risk of over-replacement of Vitamin D and agrees to not increase her dose unless she discusses this with [place] first. Brittney [name] agrees to follow-up with our clinic in 2 weeks.  Insulin Resistance Brittney [name] will continue to work on weight loss, exercise, and decreasing simple carbohydrates in her diet to help decrease the risk of diabetes. We dicussed metformin including benefits and risks. She was informed that eating too many simple carbohydrates or too many calories at one sitting increases the likelihood of GI side effects. Brittney [name] will start metformin IR and follow-up as directed.  Obesity Brittney [name] is currently in the action stage of change. As such, her goal is to continue with weight loss efforts. She has agreed to follow the Category 3 plan. Brittney [name] will work on meal planning and intentional eating. Brittney [name] has been instructed to continue her current exercise regimen for weight loss and overall health benefits. We discussed the following Behavioral Modification Strategies today: increasing lean protein intake, decreasing simple carbohydrates, increasing vegetables, increase H20 intake, decrease eating out, no skipping meals, work on meal planning and easy cooking plans, keeping healthy foods in the home, and planning for success.  Brittney [name] has agreed to follow-up with our clinic in 2 weeks. She was informed of the importance of frequent follow-up visits to maximize her success with intensive lifestyle modifications for her multiple health conditions.  ALLERGIES: Allergies  Allergen Reactions  . Prevacid [Lansoprazole] Rash     MEDICATIONS: Current Outpatient Medications on File Prior to Visit  Medication Sig Dispense Refill  . fluticasone (FLONASE) 50 MCG/ACT nasal spray Place 2 sprays into both nostrils daily as needed (for allergies.). 48 g 3  . hydrocortisone (ANUSOL-HC) 25 MG suppository Place 1 suppository (25 mg total) rectally 2 (two) times daily as needed for hemorrhoids or anal itching. 12 suppository 1  . ibuprofen (ADVIL,MOTRIN) 600 MG tablet Take 1 tablet (600 mg total) by mouth every 8 (eight) hours as needed for fever, headache, mild pain or moderate pain. With food 45 tablet 1  . levocetirizine (XYZAL) 5 MG tablet Take 1 tablet (5 mg total) by mouth every evening. 90 tablet 3  . montelukast (SINGULAIR) 10 MG tablet Take 1 tablet (10 mg total) by mouth at bedtime. 90 tablet 3  . sertraline (ZOLOFT) 25 MG tablet Take 1 tablet (25 mg total) by mouth daily. 30 tablet 0  . triamcinolone cream (KENALOG) 0.1 % Apply 1 application topically 2 (two) times daily as needed (for Eczema).    . vitamin B-12 (CYANOCOBALAMIN) 1000 MCG tablet Take 1,000 mcg by mouth daily.    . metFORMIN (GLUCOPHAGE) 500 MG tablet Take 1 tablet (500 mg total) by mouth daily with breakfast. (Patient not taking: Reported on 06/05/2019) 30 tablet 0   No current facility-administered medications on file prior to visit.     PAST MEDICAL HISTORY: Past Medical History:  Diagnosis Date  . Anemia   . B12 deficiency   . Back pain   . Colon polyps   . Coronary artery abnormality    spasms   . 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 reports today has not had any spasms or chest pains in over 2 years    . Fibroids   . Gallstones   . Irritable bowel syndrome   . Joint pain   . Lactose intolerance   . Migraine    hasnt had one in a long time  . Obesity, unspecified   . Vitamin D deficiency      PAST SURGICAL HISTORY: Past Surgical History:  Procedure Laterality Date  . CHOLECYSTECTOMY  05/06/11  . COLONOSCOPY  11/2001   polyp; negative pathology  . COLONOSCOPY  09/2004   Negative  . COLONOSCOPY  2017 or 2018 patient  unsure   [facility] ;   . COLONOSCOPY W/ POLYPECTOMY    . CYSTOSCOPY N/A 01/03/2018   Procedure: CYSTOSCOPY;  Surgeon: Bobbye Charleston, MD;  [facility];  Service: Gynecology;  Laterality: N/A;  . ESOPHAGOGASTRODUODENOSCOPY  1/10   normal  . FLEXIBLE SIGMOIDOSCOPY  03/05/2012   Procedure: FLEXIBLE SIGMOIDOSCOPY;  Surgeon: Inda Castle, MD;  [facility];  Service: Endoscopy;  Laterality: N/A;  . LAPAROSCOPIC DECORTICATION / DUBULKING / ABLATION RENAL CYSTS     saline histogram  . ROBOTIC ASSISTED LAPAROSCOPIC HYSTERECTOMY AND SALPINGECTOMY Bilateral 01/03/2018   Procedure: XI ROBOTIC ASSISTED LAPAROSCOPIC HYSTERECTOMY AND SALPINGECTOMY;  Surgeon: Bobbye Charleston, MD;  Location: [facility];  Service: Gynecology;  Laterality: Bilateral;  WITH BED AFTER  . TONSILLECTOMY  07/30/07  . Uterine US  09/2005   Large endometrial stripe; small ovarian cyst    SOCIAL HISTORY: Social History   Tobacco Use  . Smoking status: Never Smoker  . Smokeless tobacco: Never Used  Substance Use Topics  . Alcohol use: No    Alcohol/week: 0.0 standard drinks  . Drug use: No    FAMILY HISTORY: Family History  Problem Relation Age of Onset  . Heart disease Father   . Hypertension Father   . Heart attack Father   . Diabetes Father   . Sleep apnea Mother   . Obesity Mother   . Ulcers Brother   . Clotting disorder Brother   . Ovarian cancer Maternal Grandmother   . Colon polyps Maternal Grandmother   . Colon cancer Maternal Grandmother        40's  . Diabetes Paternal Grandmother   . Hypertension Paternal Grandmother   . Breast cancer Maternal Aunt        Age 32's  . Diabetes Paternal Aunt        x 5  . Lung cancer Maternal Grandfather        SMOKER    ROS: Review of Systems  Gastrointestinal: Negative for nausea and vomiting.  Musculoskeletal:       Negative for muscle weakness.  Endo/Heme/Allergies:       Negative for polyphagia.   PHYSICAL EXAM: Pt in no acute distress  RECENT LABS AND TESTS: BMET    Component Value Date/Time   NA 138 02/21/2019 0752   NA 140 07/02/2014 1253   K 4.4 02/21/2019 0752   K 4.2 07/02/2014 1253   CL 104 02/21/2019 0752   CL 107 07/02/2014 1253   CO2 27 02/21/2019 0752   CO2 31 07/02/2014 1253   GLUCOSE 92 02/21/2019 0752   GLUCOSE 89 07/02/2014 1253   BUN 12 02/21/2019 0752   BUN 10 07/02/2014 1253   CREATININE 1.03 02/21/2019 0752   CREATININE 1.07 07/02/2014 1253   CREATININE 0.82 09/02/2013 1250   CALCIUM 9.0 02/21/2019 0752   CALCIUM 8.7 07/02/2014 1253   GFRNONAA >60 09/16/2016 1937   GFRNONAA >60 07/02/2014 1253   GFRNONAA >60 07/05/2013 1025   GFRAA >60 09/16/2016 1937   GFRAA >60 07/02/2014 1253   GFRAA >60 07/05/2013 1025   Lab Results  Component Value Date   HGBA1C 4.4 02/21/2019   HGBA1C 5.8 02/21/2019   Lab Results  Component Value Date   INSULIN 10.8 03/07/2019   CBC    Component Value Date/Time   WBC 8.2 02/21/2019 0752   RBC 5.10 02/21/2019 0752   HGB 13.6 02/21/2019 0752   HGB 11.4 (L) 07/02/2014 1253   HCT 42.8 02/21/2019 0752   HCT 36.1 07/02/2014 1253   PLT 272.0 02/21/2019 0752   PLT 260 07/02/2014 1253   MCV 84.1 02/21/2019 0752   MCV 81 07/02/2014 1253   MCH 24.6 (L) 12/22/2017 0850   MCHC 31.8 02/21/2019 0752   RDW 16.0 (H) 02/21/2019 0752   RDW 16.9 (H) 07/02/2014 1253   LYMPHSABS 2.6 02/21/2019 0752   LYMPHSABS 1.3 02/19/2012 0455   MONOABS 0.5 02/21/2019 0752   MONOABS 0.5 02/19/2012 0455   EOSABS 0.1 02/21/2019 0752   EOSABS 0.1 02/19/2012 0455   BASOSABS 0.1 02/21/2019 0752   BASOSABS 0.0 02/19/2012 0455   Iron/TIBC/Ferritin/ %Sat    Component Value Date/Time   IRON 152 (H) 09/20/2016 1428   FERRITIN 6.7 (L) 09/20/2016 1428    IRONPCTSAT 32.6  09/20/2016 1428   Lipid Panel     Component Value Date/Time   CHOL 176 03/07/2019 0957   TRIG 68 03/07/2019 0957   HDL 46 03/07/2019 0957   CHOLHDL 5 09/18/2018 1624   VLDL 13.4 09/18/2018 1624   LDLCALC 116 (H) 03/07/2019 0957   Hepatic Function Panel     Component Value Date/Time   PROT 6.7 02/21/2019 0752   PROT 7.0 07/02/2014 1253   ALBUMIN 4.0 02/21/2019 0752   ALBUMIN 3.5 07/02/2014 1253   AST 14 02/21/2019 0752   AST 26 07/02/2014 1253   ALT 14 02/21/2019 0752   ALT 25 07/02/2014 1253   ALKPHOS 71 02/21/2019 0752   ALKPHOS 93 07/02/2014 1253   BILITOT 0.5 02/21/2019 0752   BILITOT 0.4 07/02/2014 1253   BILIDIR 0.1 07/23/2008 1707      Component Value Date/Time   TSH 2.44 02/21/2019 0752   TSH 1.16 09/18/2018 1624   TSH 1.76 08/29/2017 1255   Results for GILLIAM-WILSON, Jynesis D (MRN [id number]) as of 06/06/2019 12:26  Ref. Range 02/21/2019 07:52  VITD Latest Ref Range: 30.00 - 100.00 ng/mL 16.62 (L)   I, Michaelene Song, am acting as Location manager for [employer], DO  I have reviewed the above documentation for accuracy and completeness, and I agree with the above. -Jearld Lesch, DO

## 2019-06-10 ENCOUNTER — Ambulatory Visit
Admission: RE | Admit: 2019-06-10 | Discharge: 2019-06-10 | Disposition: A | Discharge status: Home / Self Care | Payer: BC Managed Care – PPO | Source: Ambulatory Visit | Attending: Family Medicine | Admitting: Family Medicine

## 2019-06-10 ENCOUNTER — Other Ambulatory Visit: Payer: Self-pay

## 2019-06-10 DIAGNOSIS — Z1231 Encounter for screening mammogram for malignant neoplasm of breast: Secondary | ICD-10-CM

## 2019-06-25 ENCOUNTER — Other Ambulatory Visit: Payer: Self-pay

## 2019-06-25 ENCOUNTER — Ambulatory Visit (INDEPENDENT_AMBULATORY_CARE_PROVIDER_SITE_OTHER): Payer: BC Managed Care – PPO | Admitting: Bariatrics

## 2019-06-25 ENCOUNTER — Encounter (INDEPENDENT_AMBULATORY_CARE_PROVIDER_SITE_OTHER): Payer: Self-pay | Admitting: Bariatrics

## 2019-06-25 DIAGNOSIS — E559 Vitamin D deficiency, unspecified: Secondary | ICD-10-CM | POA: Diagnosis not present

## 2019-06-25 DIAGNOSIS — E8881 Metabolic syndrome: Secondary | ICD-10-CM | POA: Diagnosis not present

## 2019-06-25 DIAGNOSIS — Z6841 Body Mass Index (BMI) 40.0 and over, adult: Secondary | ICD-10-CM

## 2019-06-25 MED ORDER — VITAMIN D (ERGOCALCIFEROL) 1.25 MG (50000 UNIT) PO CAPS: 50000.0000 [IU] | ORAL_CAPSULE | ORAL | 0 refills | Status: DC

## 2019-06-25 MED ORDER — METFORMIN HCL 500 MG PO TABS: 500.0000 mg | ORAL_TABLET | Freq: Every day | ORAL | 0 refills | Status: DC

## 2019-06-26 NOTE — Progress Notes (Signed)
Office: [phone]  /  Fax: [phone] TeleHealth Visit:  Brittney [name] has verbally consented to this TeleHealth visit today. The patient is located at home, the provider is located at the [employer] [facility] office. The participants in this visit include the listed provider and patient. The visit was conducted today via webex.  HPI:   Chief Complaint: OBESITY Brittney [name] is here to discuss her progress with her obesity treatment plan. She is on the Category 3 plan and is following her eating plan approximately 100 % of the time. She states she is doing cardio and weight for 60 minutes 5 times per week, and walking and biking for 3.5 miles 5-7 times per week. Brittney [name] states that her weight remains the same (weight of 266 lbs). She has had less energy. She is sleeping ok. Her stress level is better. Her eating window is an 8 hour window (11-7-8).  We were unable to weigh the patient today for this TeleHealth visit. She feels as if she has maintained her weight since her last visit. She has lost 0 lbs since starting treatment with [place].  Vitamin D Deficiency Brittney [name] has a diagnosis of vitamin D deficiency. She is currently taking prescription Vit D and denies nausea, vomiting or muscle weakness.  Insulin Resistance Brittney [name] has a diagnosis of insulin resistance based on her elevated fasting insulin level >5. Although Brittney [name]'s blood glucose readings are still under good control, insulin resistance puts her at greater risk of metabolic syndrome and diabetes. She is taking metformin currently and notes her appetite is normal. She continues to work on diet and exercise to decrease risk of diabetes.  ASSESSMENT AND PLAN:  Vitamin D deficiency - Plan: Vitamin D, Ergocalciferol, (DRISDOL) 1.25 MG (50000 UT) CAPS capsule  Insulin resistance - Plan: metFORMIN (GLUCOPHAGE) 500 MG tablet  Class 3 severe obesity with serious comorbidity and body mass index (BMI) of 40.0 to 44.9 in adult, unspecified obesity  type ([place])  PLAN:  Vitamin D Deficiency Brittney [name] was informed that low vitamin D levels contributes to fatigue and are associated with obesity, breast, and colon cancer. Brittney [name] agrees to continue taking prescription Vit D 50,000 IU every week #4 and we will refill for 1 month. She will follow up for routine testing of vitamin D, at least 2-3 times per year. She was informed of the risk of over-replacement of vitamin D and agrees to not increase her dose unless she discusses this with [place] first. Brittney [name] agrees to follow up with our clinic in 2 to 3 weeks.  Insulin Resistance Brittney [name] will continue to work on weight loss, exercise, and decreasing simple carbohydrates in her diet to help decrease the risk of diabetes. We dicussed metformin including benefits and risks. She was informed that eating too many simple carbohydrates or too many calories at one sitting increases the likelihood of GI side effects. Brittney [name] agrees to continue taking metformin 500 mg 1 tablet PO daily with breakfast #30 and we will refill for 1 month. Brittney [name] agrees to follow up with our clinic in 2 to 3 weeks as directed to monitor her progress.  Obesity Brittney [name] is currently in the action stage of change. As such, her goal is to continue with weight loss efforts She has agreed to keep a food journal with 1500 calories and 100+ grams of protein daily Brittney [name] has been instructed to work up to a goal of 150 minutes of combined cardio and strengthening exercise per week for weight loss and overall health benefits.  We discussed the following Behavioral Modification Strategies today: increasing lean protein intake, decreasing simple carbohydrates , increasing vegetables, decrease eating out and work on meal planning and easy cooking plans, increase H20 intake, no skipping meals, keeping healthy foods in the home, planning for success, and keep a strict food journal Brittney [name] is to do meal preparation on Sundays.  Brittney [name] has agreed to follow up with our clinic in 2 to 3 weeks.  She was informed of the importance of frequent follow up visits to maximize her success with intensive lifestyle modifications for her multiple health conditions.  ALLERGIES: Allergies  Allergen Reactions  . Prevacid [Lansoprazole] Rash    MEDICATIONS: Current Outpatient Medications on File Prior to Visit  Medication Sig Dispense Refill  . fluticasone (FLONASE) 50 MCG/ACT nasal spray Place 2 sprays into both nostrils daily as needed (for allergies.). 48 g 3  . hydrocortisone (ANUSOL-HC) 25 MG suppository Place 1 suppository (25 mg total) rectally 2 (two) times daily as needed for hemorrhoids or anal itching. 12 suppository 1  . ibuprofen (ADVIL,MOTRIN) 600 MG tablet Take 1 tablet (600 mg total) by mouth every 8 (eight) hours as needed for fever, headache, mild pain or moderate pain. With food 45 tablet 1  . levocetirizine (XYZAL) 5 MG tablet Take 1 tablet (5 mg total) by mouth every evening. 90 tablet 3  . montelukast (SINGULAIR) 10 MG tablet Take 1 tablet (10 mg total) by mouth at bedtime. 90 tablet 3  . sertraline (ZOLOFT) 25 MG tablet Take 1 tablet (25 mg total) by mouth daily. 30 tablet 0  . triamcinolone cream (KENALOG) 0.1 % Apply 1 application topically 2 (two) times daily as needed (for Eczema).    . vitamin B-12 (CYANOCOBALAMIN) 1000 MCG tablet Take 1,000 mcg by mouth daily.     No current facility-administered medications on file prior to visit.     PAST MEDICAL HISTORY: Past Medical History:  Diagnosis Date  . Anemia   . B12 deficiency   . Back pain   . Colon polyps   . Coronary artery abnormality    spasms   . 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 reports today has not had any spasms or chest pains in over 2 years    . Fibroids   . Gallstones   . Irritable bowel syndrome   . Joint pain   . Lactose intolerance   . Migraine     hasnt had one in a long time  . Obesity, unspecified   . Vitamin D deficiency     PAST SURGICAL HISTORY: Past Surgical History:  Procedure Laterality Date  . CHOLECYSTECTOMY  05/06/11  . COLONOSCOPY  11/2001   polyp; negative pathology  . COLONOSCOPY  09/2004   Negative  . COLONOSCOPY  2017 or 2018 patient  unsure   [facility] ;   . COLONOSCOPY W/ POLYPECTOMY    . CYSTOSCOPY N/A 01/03/2018   Procedure: CYSTOSCOPY;  Surgeon: Bobbye Charleston, MD;  Location: [facility];  Service: Gynecology;  Laterality: N/A;  . ESOPHAGOGASTRODUODENOSCOPY  1/10   normal  . FLEXIBLE SIGMOIDOSCOPY  03/05/2012   Procedure: FLEXIBLE SIGMOIDOSCOPY;  Surgeon: Inda Castle, MD;  Location: [facility];  Service: Endoscopy;  Laterality: N/A;  . LAPAROSCOPIC DECORTICATION / DUBULKING / ABLATION RENAL CYSTS     saline histogram  . ROBOTIC ASSISTED LAPAROSCOPIC  HYSTERECTOMY AND SALPINGECTOMY Bilateral 01/03/2018   Procedure: XI ROBOTIC ASSISTED LAPAROSCOPIC HYSTERECTOMY AND SALPINGECTOMY;  Surgeon: Bobbye Charleston, MD;  Location: [facility];  Service: Gynecology;  Laterality: Bilateral;  WITH BED AFTER  . TONSILLECTOMY  07/30/07  . Uterine US  09/2005   Large endometrial stripe; small ovarian cyst    SOCIAL HISTORY: Social History   Tobacco Use  . Smoking status: Never Smoker  . Smokeless tobacco: Never Used  Substance Use Topics  . Alcohol use: No    Alcohol/week: 0.0 standard drinks  . Drug use: No    FAMILY HISTORY: Family History  Problem Relation Age of Onset  . Heart disease Father   . Hypertension Father   . Heart attack Father   . Diabetes Father   . Sleep apnea Mother   . Obesity Mother   . Ulcers Brother   . Clotting disorder Brother   . Ovarian cancer Maternal Grandmother   . Colon polyps Maternal Grandmother   . Colon cancer Maternal Grandmother        40's  . Diabetes Paternal Grandmother   . Hypertension Paternal Grandmother   . Breast cancer Maternal Aunt        Age 9's  .  Diabetes Paternal Aunt        x 5  . Lung cancer Maternal Grandfather        SMOKER    ROS: Review of Systems  Constitutional: Negative for weight loss.  Gastrointestinal: Negative for nausea and vomiting.  Musculoskeletal:       Negative muscle weakness    PHYSICAL EXAM: Pt in no acute distress  RECENT LABS AND TESTS: BMET    Component Value Date/Time   NA 138 02/21/2019 0752   NA 140 07/02/2014 1253   K 4.4 02/21/2019 0752   K 4.2 07/02/2014 1253   CL 104 02/21/2019 0752   CL 107 07/02/2014 1253   CO2 27 02/21/2019 0752   CO2 31 07/02/2014 1253   GLUCOSE 92 02/21/2019 0752   GLUCOSE 89 07/02/2014 1253   BUN 12 02/21/2019 0752   BUN 10 07/02/2014 1253   CREATININE 1.03 02/21/2019 0752   CREATININE 1.07 07/02/2014 1253   CREATININE 0.82 09/02/2013 1250   CALCIUM 9.0 02/21/2019 0752   CALCIUM 8.7 07/02/2014 1253   GFRNONAA >60 09/16/2016 1937   GFRNONAA >60 07/02/2014 1253   GFRNONAA >60 07/05/2013 1025   GFRAA >60 09/16/2016 1937   GFRAA >60 07/02/2014 1253   GFRAA >60 07/05/2013 1025   Lab Results  Component Value Date   HGBA1C 4.4 02/21/2019   HGBA1C 5.8 02/21/2019   Lab Results  Component Value Date   INSULIN 10.8 03/07/2019   CBC    Component Value Date/Time   WBC 8.2 02/21/2019 0752   RBC 5.10 02/21/2019 0752   HGB 13.6 02/21/2019 0752   HGB 11.4 (L) 07/02/2014 1253   HCT 42.8 02/21/2019 0752   HCT 36.1 07/02/2014 1253   PLT 272.0 02/21/2019 0752   PLT 260 07/02/2014 1253   MCV 84.1 02/21/2019 0752   MCV 81 07/02/2014 1253   MCH 24.6 (L) 12/22/2017 0850   MCHC 31.8 02/21/2019 0752   RDW 16.0 (H) 02/21/2019 0752   RDW 16.9 (H) 07/02/2014 1253   LYMPHSABS 2.6 02/21/2019 0752   LYMPHSABS 1.3 02/19/2012 0455   MONOABS 0.5 02/21/2019 0752   MONOABS 0.5 02/19/2012 0455   EOSABS 0.1 02/21/2019 0752   EOSABS 0.1 02/19/2012 0455   BASOSABS 0.1 02/21/2019 0752  BASOSABS 0.0 02/19/2012 0455   Iron/TIBC/Ferritin/ %Sat    Component Value  Date/Time   IRON 152 (H) 09/20/2016 1428   FERRITIN 6.7 (L) 09/20/2016 1428   IRONPCTSAT 32.6 09/20/2016 1428   Lipid Panel     Component Value Date/Time   CHOL 176 03/07/2019 0957   TRIG 68 03/07/2019 0957   HDL 46 03/07/2019 0957   CHOLHDL 5 09/18/2018 1624   VLDL 13.4 09/18/2018 1624   LDLCALC 116 (H) 03/07/2019 0957   Hepatic Function Panel     Component Value Date/Time   PROT 6.7 02/21/2019 0752   PROT 7.0 07/02/2014 1253   ALBUMIN 4.0 02/21/2019 0752   ALBUMIN 3.5 07/02/2014 1253   AST 14 02/21/2019 0752   AST 26 07/02/2014 1253   ALT 14 02/21/2019 0752   ALT 25 07/02/2014 1253   ALKPHOS 71 02/21/2019 0752   ALKPHOS 93 07/02/2014 1253   BILITOT 0.5 02/21/2019 0752   BILITOT 0.4 07/02/2014 1253   BILIDIR 0.1 07/23/2008 1707      Component Value Date/Time   TSH 2.44 02/21/2019 0752   TSH 1.16 09/18/2018 1624   TSH 1.76 08/29/2017 1255      I, Trixie Dredge, am acting as Location manager for [employer], DO  I have reviewed the above documentation for accuracy and completeness, and I agree with the above. Jearld Lesch, DO

## 2019-06-27 ENCOUNTER — Encounter (INDEPENDENT_AMBULATORY_CARE_PROVIDER_SITE_OTHER): Payer: Self-pay | Admitting: Bariatrics

## 2019-06-28 ENCOUNTER — Encounter (INDEPENDENT_AMBULATORY_CARE_PROVIDER_SITE_OTHER): Payer: Self-pay | Admitting: Bariatrics

## 2019-06-28 ENCOUNTER — Other Ambulatory Visit: Payer: Self-pay

## 2019-06-28 ENCOUNTER — Ambulatory Visit: Payer: BC Managed Care – PPO | Admitting: Internal Medicine

## 2019-06-28 ENCOUNTER — Encounter: Payer: Self-pay | Admitting: Internal Medicine

## 2019-06-28 VITALS — BP 108/68 | HR 64 | Temp 98.4°F | Wt 265.0 lb

## 2019-06-28 DIAGNOSIS — H9201 Otalgia, right ear: Secondary | ICD-10-CM

## 2019-06-28 DIAGNOSIS — H6121 Impacted cerumen, right ear: Secondary | ICD-10-CM

## 2019-06-28 NOTE — Progress Notes (Signed)
Subjective:    Patient ID: Brittney [name], female    DOB: 04/23/1981, 38 y.o.   MRN: [id number]  HPI  Pt presents to the clinic today for Right ear pain that has been getting worse for the past 2 days. She was seen 05/16/2019 for the same, treated with 10 day course of Augmentin. Today she reports her pain as achy and sharp that radiates down her right side of neck. She denies hearing loss in same ear. She reports a history of recurrent ear infections but this time the pain is different in severity and intensity. She has not tried OTC medications. She denies fever, trauma, drainage or recent water submersion.  Review of Systems  Past Medical History:  Diagnosis Date  . Anemia   . B12 deficiency   . Back pain   . Colon polyps   . Coronary artery abnormality    spasms   . 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 reports today has not had any spasms or chest pains in over 2 years    . Fibroids   . Gallstones   . Irritable bowel syndrome   . Joint pain   . Lactose intolerance   . Migraine    hasnt had one in a long time  . Obesity, unspecified   . Vitamin D deficiency     Current Outpatient Medications  Medication Sig Dispense Refill  . fluticasone (FLONASE) 50 MCG/ACT nasal spray Place 2 sprays into both nostrils daily as needed (for allergies.). 48 g 3  . hydrocortisone (ANUSOL-HC) 25 MG suppository Place 1 suppository (25 mg total) rectally 2 (two) times daily as needed for hemorrhoids or anal itching. 12 suppository 1  . ibuprofen (ADVIL,MOTRIN) 600 MG tablet Take 1 tablet (600 mg total) by mouth every 8 (eight) hours as needed for fever, headache, mild pain or moderate pain. With food 45 tablet 1  . levocetirizine (XYZAL) 5 MG tablet Take 1 tablet (5 mg total) by mouth every evening. 90 tablet 3  . metFORMIN (GLUCOPHAGE) 500 MG  tablet Take 1 tablet (500 mg total) by mouth daily with breakfast. 30 tablet 0  . montelukast (SINGULAIR) 10 MG tablet Take 1 tablet (10 mg total) by mouth at bedtime. 90 tablet 3  . sertraline (ZOLOFT) 25 MG tablet Take 1 tablet (25 mg total) by mouth daily. 30 tablet 0  . triamcinolone cream (KENALOG) 0.1 % Apply 1 application topically 2 (two) times daily as needed (for Eczema).    . vitamin B-12 (CYANOCOBALAMIN) 1000 MCG tablet Take 1,000 mcg by mouth daily.    . Vitamin D, Ergocalciferol, (DRISDOL) 1.25 MG (50000 UT) CAPS capsule Take 1 capsule (50,000 Units total) by mouth every 7 (seven) days. 4 capsule 0   No current facility-administered medications for this visit.     Allergies  Allergen Reactions  . Prevacid [Lansoprazole] Rash    Family History  Problem Relation Age of Onset  . Heart disease Father   . Hypertension Father   . Heart attack Father   . Diabetes Father   . Sleep apnea Mother   . Obesity Mother   . Ulcers Brother   . Clotting disorder Brother   . Ovarian cancer Maternal Grandmother   . Colon polyps Maternal Grandmother   . Colon cancer Maternal Grandmother  55's  . Diabetes Paternal Grandmother   . Hypertension Paternal Grandmother   . Breast cancer Maternal Aunt        Age 26's  . Diabetes Paternal Aunt        x 5  . Lung cancer Maternal Grandfather        SMOKER    Social History   Socioeconomic History  . Marital status: Married    Spouse name: Not on file  . Number of children: 2  . Years of education: Not on file  . Highest education level: Not on file  Occupational History  . Occupation: Pharmacist, hospital  Social Needs  . Financial resource strain: Not on file  . Food insecurity    Worry: Not on file    Inability: Not on file  . Transportation needs    Medical: Not on file    Non-medical: Not on file  Tobacco Use  . Smoking status: Never Smoker  . Smokeless tobacco: Never Used  Substance and Sexual Activity  . Alcohol use: No     Alcohol/week: 0.0 standard drinks  . Drug use: No  . Sexual activity: Yes    Birth control/protection: Condom  Lifestyle  . Physical activity    Days per week: Not on file    Minutes per session: Not on file  . Stress: Not on file  Relationships  . Social Herbalist on phone: Not on file    Gets together: Not on file    Attends religious service: Not on file    Active member of club or organization: Not on file    Attends meetings of clubs or organizations: Not on file    Relationship status: Not on file  . Intimate partner violence    Fear of current or ex partner: Not on file    Emotionally abused: Not on file    Physically abused: Not on file    Forced sexual activity: Not on file  Other Topics Concern  . 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 Use-soda           Constitutional: Denies fever, malaise, fatigue, headache or abrupt weight changes.  HEENT: Pt reports Rt ear pain. Denies eye pain, eye redness, ringing in the ears, wax buildup, runny nose, nasal congestion, bloody nose, or sore throat. Respiratory: Denies difficulty breathing, shortness of breath, cough or sputum production.   Cardiovascular: Denies chest pain, chest tightness, palpitations or swelling in the hands or feet.   No other specific complaints in a complete review of systems (except as listed in HPI above).     Objective:   Physical Exam BP 108/68   Pulse 64   Temp 98.4 F (36.9 C) (Temporal)   Wt 265 lb (120.2 kg)   LMP 12/21/2017   BMI 41.50 kg/m   Wt Readings from Last 3 Encounters:  04/02/19 271 lb (122.9 kg)  03/21/19 265 lb (120.2 kg)  03/20/19 266 lb (120.7 kg)    General: Appears her stated age, obese, in NAD HEENT: Head: normal shape and size; Eyes: sclera white, no icterus and conjunctiva pink  Ears: No pain to tragus manipulation bilaterally. Ear canals are non-erythematous. Large amount of wax  present in RT canal. Tm's gray and intact, normal light reflex, no signs of infection. Neck: No adenopaty present.  Cardiovascular: Normal rate and rhythm. S1,S2  noted.  No murmur, rubs or gallops noted.  Pulmonary/Chest: Normal effort and positive vesicular breath sounds. No respiratory distress. No wheezes, rales or ronchi noted.     BMET    Component Value Date/Time   NA 138 02/21/2019 0752   NA 140 07/02/2014 1253   K 4.4 02/21/2019 0752   K 4.2 07/02/2014 1253   CL 104 02/21/2019 0752   CL 107 07/02/2014 1253   CO2 27 02/21/2019 0752   CO2 31 07/02/2014 1253   GLUCOSE 92 02/21/2019 0752   GLUCOSE 89 07/02/2014 1253   BUN 12 02/21/2019 0752   BUN 10 07/02/2014 1253   CREATININE 1.03 02/21/2019 0752   CREATININE 1.07 07/02/2014 1253   CREATININE 0.82 09/02/2013 1250   CALCIUM 9.0 02/21/2019 0752   CALCIUM 8.7 07/02/2014 1253   GFRNONAA >60 09/16/2016 1937   GFRNONAA >60 07/02/2014 1253   GFRNONAA >60 07/05/2013 1025   GFRAA >60 09/16/2016 1937   GFRAA >60 07/02/2014 1253   GFRAA >60 07/05/2013 1025    Lipid Panel     Component Value Date/Time   CHOL 176 03/07/2019 0957   TRIG 68 03/07/2019 0957   HDL 46 03/07/2019 0957   CHOLHDL 5 09/18/2018 1624   VLDL 13.4 09/18/2018 1624   LDLCALC 116 (H) 03/07/2019 0957    CBC    Component Value Date/Time   WBC 8.2 02/21/2019 0752   RBC 5.10 02/21/2019 0752   HGB 13.6 02/21/2019 0752   HGB 11.4 (L) 07/02/2014 1253   HCT 42.8 02/21/2019 0752   HCT 36.1 07/02/2014 1253   PLT 272.0 02/21/2019 0752   PLT 260 07/02/2014 1253   MCV 84.1 02/21/2019 0752   MCV 81 07/02/2014 1253   MCH 24.6 (L) 12/22/2017 0850   MCHC 31.8 02/21/2019 0752   RDW 16.0 (H) 02/21/2019 0752   RDW 16.9 (H) 07/02/2014 1253   LYMPHSABS 2.6 02/21/2019 0752   LYMPHSABS 1.3 02/19/2012 0455   MONOABS 0.5 02/21/2019 0752   MONOABS 0.5 02/19/2012 0455   EOSABS 0.1 02/21/2019 0752   EOSABS 0.1 02/19/2012 0455   BASOSABS 0.1 02/21/2019 0752    BASOSABS 0.0 02/19/2012 0455    Hgb A1C Lab Results  Component Value Date   HGBA1C 4.4 02/21/2019            Assessment & Plan:  Otalgia Right Ear secondary to Cerumen Impaction:  Some was removed by this provider using a cerumen spoon- pt tolerated well, no complications Rt ear irrigation today by CMA to get rest of was out. Use OTC Debrox Rt ear for wax removal  Return precautions given Webb Silversmith, NP

## 2019-06-30 NOTE — Patient Instructions (Signed)
Earwax Buildup, Adult The ears produce a substance called earwax that helps keep bacteria out of the ear and protects the skin in the ear canal. Occasionally, earwax can build up in the ear and cause discomfort or hearing loss. What increases the risk? This condition is more likely to develop in people who:  Are female.  Are elderly.  Naturally produce more earwax.  Clean their ears often with cotton swabs.  Use earplugs often.  Use in-ear headphones often.  Wear hearing aids.  Have narrow ear canals.  Have earwax that is overly thick or sticky.  Have eczema.  Are dehydrated.  Have excess hair in the ear canal. What are the signs or symptoms? Symptoms of this condition include:  Reduced or muffled hearing.  A feeling of fullness in the ear or feeling that the ear is plugged.  Fluid coming from the ear.  Ear pain.  Ear itch.  Ringing in the ear.  Coughing.  An obvious piece of earwax that can be seen inside the ear canal. How is this diagnosed? This condition may be diagnosed based on:  Your symptoms.  Your medical history.  An ear exam. During the exam, your health care provider will look into your ear with an instrument called an otoscope. You may have tests, including a hearing test. How is this treated? This condition may be treated by:  Using ear drops to soften the earwax.  Having the earwax removed by a health care provider. The health care provider may: ? Flush the ear with water. ? Use an instrument that has a loop on the end (curette). ? Use a suction device.  Surgery to remove the wax buildup. This may be done in severe cases. Follow these instructions at home:   Take over-the-counter and prescription medicines only as told by your health care provider.  Do not put any objects, including cotton swabs, into your ear. You can clean the opening of your ear canal with a washcloth or facial tissue.  Follow instructions from your health care  provider about cleaning your ears. Do not over-clean your ears.  Drink enough fluid to keep your urine clear or pale yellow. This will help to thin the earwax.  Keep all follow-up visits as told by your health care provider. If earwax builds up in your ears often or if you use hearing aids, consider seeing your health care provider for routine, preventive ear cleanings. Ask your health care provider how often you should schedule your cleanings.  If you have hearing aids, clean them according to instructions from the manufacturer and your health care provider. Contact a health care provider if:  You have ear pain.  You develop a fever.  You have blood, pus, or other fluid coming from your ear.  You have hearing loss.  You have ringing in your ears that does not go away.  Your symptoms do not improve with treatment.  You feel like the room is spinning (vertigo). Summary  Earwax can build up in the ear and cause discomfort or hearing loss.  The most common symptoms of this condition include reduced or muffled hearing and a feeling of fullness in the ear or feeling that the ear is plugged.  This condition may be diagnosed based on your symptoms, your medical history, and an ear exam.  This condition may be treated by using ear drops to soften the earwax or by having the earwax removed by a health care provider.  Do not put any   objects, including cotton swabs, into your ear. You can clean the opening of your ear canal with a washcloth or facial tissue. This information is not intended to replace advice given to you by your health care provider. Make sure you discuss any questions you have with your health care provider. Document Released: 09/08/2004 Document Revised: 07/14/2017 Document Reviewed: 10/12/2016 Elsevier Patient Education  2020 [employer]  

## 2019-07-01 NOTE — Telephone Encounter (Signed)
Please review

## 2019-07-06 ENCOUNTER — Other Ambulatory Visit (INDEPENDENT_AMBULATORY_CARE_PROVIDER_SITE_OTHER): Payer: Self-pay | Admitting: Family Medicine

## 2019-07-17 ENCOUNTER — Telehealth (INDEPENDENT_AMBULATORY_CARE_PROVIDER_SITE_OTHER): Payer: BC Managed Care – PPO | Admitting: Bariatrics

## 2019-07-17 ENCOUNTER — Ambulatory Visit (INDEPENDENT_AMBULATORY_CARE_PROVIDER_SITE_OTHER): Payer: BC Managed Care – PPO | Admitting: Family Medicine

## 2019-07-17 ENCOUNTER — Encounter: Payer: Self-pay | Admitting: Family Medicine

## 2019-07-17 ENCOUNTER — Other Ambulatory Visit: Payer: Self-pay | Admitting: Family Medicine

## 2019-07-17 ENCOUNTER — Other Ambulatory Visit: Payer: Self-pay

## 2019-07-17 DIAGNOSIS — H109 Unspecified conjunctivitis: Secondary | ICD-10-CM | POA: Insufficient documentation

## 2019-07-17 DIAGNOSIS — H1031 Unspecified acute conjunctivitis, right eye: Secondary | ICD-10-CM

## 2019-07-17 MED ORDER — NEOMYCIN-POLYMYXIN-HC OP SUSP: 1.0000 [drp] | Freq: Three times a day (TID) | OPHTHALMIC | 0 refills | Status: DC

## 2019-07-17 NOTE — Patient Instructions (Signed)
I think you have either viral or bacterial pink eye (conjunctivitis) Use drops as directed  Watch for worse swelling/redness or vision change and let us know   Try not to touch eye Take out contacts and throw them away Wear glasses until you are better  Use warm wet cloth to wipe away discharge It will be worse in am   Update if not starting to improve in a week or if worsening

## 2019-07-17 NOTE — Telephone Encounter (Signed)
I sent in the alternative given  Please let her know that it is a little different but should help Please alert me if symptoms worsen or do not improve

## 2019-07-17 NOTE — Telephone Encounter (Signed)
Pt notified of Dr. Marliss Coots comments and that new Rx was sent

## 2019-07-17 NOTE — Progress Notes (Signed)
Subjective:    Patient ID: Brittney [name], female    DOB: 05/27/1981, 38 y.o.   MRN: [id number]  HPI  Pt presents with R eye swelling   Wt Readings from Last 3 Encounters:  07/17/19 263 lb 5 oz (119.4 kg)  06/28/19 265 lb (120.2 kg)  04/02/19 271 lb (122.9 kg)   41.24 kg/m   Woke up with really swollen eyelids on the R  Had some d/c  Used a warm compress to get it open   Swelling improved   Feels a little itchy  And watery  Sensitive to the light as well  Vision is ok   Eye does not look red   No chemical exp No pollen issues  No trauma   No exp known to pink eye   Patient Active Problem List   Diagnosis Date Noted  . Right conjunctivitis 07/17/2019  . OME (otitis media with effusion), left 05/16/2019  . Mild reactive airways disease 03/20/2019  . Hot flashes 02/20/2019  . Constipation 02/20/2019  . Fatigue 02/20/2019  . Right knee pain 10/17/2018  . Benign paroxysmal positional vertigo 05/16/2018  . Postoperative state 01/03/2018  . Lipoma of back 10/04/2017  . Routine general medical examination at a health care facility 08/29/2017  . Fibrocystic breast changes 01/08/2016  . Hemorrhoids, external 09/30/2014  . Chronic diarrhea 08/29/2014  . 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 Hemorrhoids, internal, with bleeding 04/22/2014  . Family history of pulmonary embolism 09/02/2013  . Family history of diabetes mellitus 09/02/2013  . Cutaneous skin tags 05/15/2013  . 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 Morbid obesity ([place]) 03/31/2008  . HEARING LOSS, MILD 03/31/2008  . MIGRAINE HEADACHE 02/20/2007  . IBS 02/20/2007  . OSTEOARTHRITIS 02/20/2007   Past Medical History:  Diagnosis Date  . Anemia   . B12 deficiency   . Back pain   . Colon polyps   . Coronary artery abnormality    spasms   . Coronary artery spasm [facility])    reports saw cardiologist for chest pains , was told she was having coronary artery spasms , sent for cardio w/u with  stress and echo  both unremarkable. reports today has not had any spasms or chest pains in over 2 years    . Fibroids   . Gallstones   . Irritable bowel syndrome   . Joint pain   . Lactose intolerance   . Migraine    hasnt had one in a long time  . Obesity, unspecified   . Vitamin D deficiency    Past Surgical History:  Procedure Laterality Date  . CHOLECYSTECTOMY  05/06/11  . COLONOSCOPY  11/2001   polyp; negative pathology  . COLONOSCOPY  09/2004   Negative  . COLONOSCOPY  2017 or 2018 patient  unsure   [facility] ;   . COLONOSCOPY W/ POLYPECTOMY    . CYSTOSCOPY N/A 01/03/2018   Procedure: CYSTOSCOPY;  Surgeon: Bobbye Charleston, MD;  Location: [facility];  Service: Gynecology;  Laterality: N/A;  . ESOPHAGOGASTRODUODENOSCOPY  1/10   normal  . FLEXIBLE SIGMOIDOSCOPY  03/05/2012   Procedure: FLEXIBLE SIGMOIDOSCOPY;  Surgeon: Inda Castle, MD;  Location: [facility];  Service: Endoscopy;  Laterality: N/A;  . LAPAROSCOPIC DECORTICATION / DUBULKING / ABLATION RENAL CYSTS     saline histogram  . ROBOTIC ASSISTED LAPAROSCOPIC HYSTERECTOMY AND SALPINGECTOMY Bilateral 01/03/2018   Procedure: XI ROBOTIC ASSISTED LAPAROSCOPIC HYSTERECTOMY AND SALPINGECTOMY;  Surgeon: Bobbye Charleston, MD;  Location: [facility];  Service:  Gynecology;  Laterality: Bilateral;  WITH BED AFTER  . TONSILLECTOMY  07/30/07  . Uterine US  09/2005   Large endometrial stripe; small ovarian cyst   Social History   Tobacco Use  . Smoking status: Never Smoker  . Smokeless tobacco: Never Used  Substance Use Topics  . Alcohol use: No    Alcohol/week: 0.0 standard drinks  . Drug use: No   Family History  Problem Relation Age of Onset  . Heart disease Father   . Hypertension Father   . Heart attack Father   . Diabetes Father   . Sleep apnea Mother   . Obesity Mother   . Ulcers Brother   . Clotting disorder Brother   . Ovarian cancer Maternal Grandmother   . Colon polyps Maternal Grandmother   . Colon cancer  Maternal Grandmother        40's  . Diabetes Paternal Grandmother   . Hypertension Paternal Grandmother   . Breast cancer Maternal Aunt        Age 64's  . Diabetes Paternal Aunt        x 5  . Lung cancer Maternal Grandfather        SMOKER   Allergies  Allergen Reactions  . Prevacid [Lansoprazole] Rash   Current Outpatient Medications on File Prior to Visit  Medication Sig Dispense Refill  . fluticasone (FLONASE) 50 MCG/ACT nasal spray Place 2 sprays into both nostrils daily as needed (for allergies.). 48 g 3  . hydrocortisone (ANUSOL-HC) 25 MG suppository Place 1 suppository (25 mg total) rectally 2 (two) times daily as needed for hemorrhoids or anal itching. 12 suppository 1  . ibuprofen (ADVIL,MOTRIN) 600 MG tablet Take 1 tablet (600 mg total) by mouth every 8 (eight) hours as needed for fever, headache, mild pain or moderate pain. With food 45 tablet 1  . levocetirizine (XYZAL) 5 MG tablet Take 1 tablet (5 mg total) by mouth every evening. 90 tablet 3  . metFORMIN (GLUCOPHAGE) 500 MG tablet Take 1 tablet (500 mg total) by mouth daily with breakfast. 30 tablet 0  . montelukast (SINGULAIR) 10 MG tablet Take 1 tablet (10 mg total) by mouth at bedtime. 90 tablet 3  . sertraline (ZOLOFT) 25 MG tablet TAKE 1 TABLET BY MOUTH EVERY DAY 90 tablet 1  . triamcinolone cream (KENALOG) 0.1 % Apply 1 application topically 2 (two) times daily as needed (for Eczema).    . vitamin B-12 (CYANOCOBALAMIN) 1000 MCG tablet Take 1,000 mcg by mouth daily.    . Vitamin D, Ergocalciferol, (DRISDOL) 1.25 MG (50000 UT) CAPS capsule Take 1 capsule (50,000 Units total) by mouth every 7 (seven) days. 4 capsule 0   No current facility-administered medications on file prior to visit.     Review of Systems  Constitutional: Negative for activity change, appetite change, fatigue, fever and unexpected weight change.  HENT: Negative for congestion, ear pain, rhinorrhea, sinus pressure and sore throat.   Eyes: Positive  for photophobia, discharge and itching. Negative for pain, redness and visual disturbance.  Respiratory: Negative for cough, shortness of breath and wheezing.   Cardiovascular: Negative for chest pain and palpitations.  Gastrointestinal: Negative for abdominal pain, blood in stool, constipation and diarrhea.  Endocrine: Negative for polydipsia and polyuria.  Genitourinary: Negative for dysuria, frequency and urgency.  Musculoskeletal: Negative for arthralgias, back pain and myalgias.  Skin: Negative for pallor and rash.  Allergic/Immunologic: Negative for environmental allergies.  Neurological: Negative for dizziness, syncope and headaches.  Hematological: Negative for  adenopathy. Does not bruise/bleed easily.  Psychiatric/Behavioral: Negative for decreased concentration and dysphoric mood. The patient is not nervous/anxious.        Objective:   Physical Exam Constitutional:      General: She is not in acute distress.    Appearance: Normal appearance. She is obese. She is not ill-appearing.  HENT:     Head: Normocephalic and atraumatic.     Right Ear: External ear normal.     Left Ear: External ear normal.     Nose: Nose normal. No congestion.     Mouth/Throat:     Mouth: Mucous membranes are moist.     Pharynx: Oropharynx is clear.  Eyes:     General: No scleral icterus.       Right eye: Discharge present.        Left eye: No discharge.     Extraocular Movements: Extraocular movements intact.     Pupils: Pupils are equal, round, and reactive to light.     Comments: Scant conjunctival injection R eye w/o swelling Upper lid very slt swollen No fb seen  No stye seen Vision grossly nl   Neck:     Musculoskeletal: Normal range of motion and neck supple. No neck rigidity.  Cardiovascular:     Rate and Rhythm: Normal rate and regular rhythm.     Heart sounds: Normal heart sounds.  Pulmonary:     Effort: Pulmonary effort is normal. No respiratory distress.     Breath sounds:  Normal breath sounds. No wheezing or rales.  Lymphadenopathy:     Cervical: No cervical adenopathy.  Skin:    Findings: No erythema or rash.  Neurological:     Mental Status: She is alert. Mental status is at baseline.     Cranial Nerves: No cranial nerve deficit.  Psychiatric:        Mood and Affect: Mood normal.           Assessment & Plan:   Problem List Items Addressed This Visit      Other   Right conjunctivitis    Mild with some lid swelling this am/ now improved Reassuring exam  Cannot tell if viral vs bacterial based on exam and earlier picture inst to throw out current contacts and not put in new ones until complete resolution Handouts given to pt re: both Px neomycin/polymixin opth drops to use tid Update if not starting to improve in a week or if worsening  She will watch also for cold symptoms or change in vision

## 2019-07-17 NOTE — Telephone Encounter (Signed)
Note on Rx says:  "Product Backordered/Unavailable:PRESCRIBED PRODUCT NOT IN STOCK. PLEASE CONSIDER THE COST-EFFECTIVE POTENTIAL ALTERNATIVE(S) LISTED AND EVALUATE IF APPROPRIATE FOR YOUR PATIENT'S INDICATION AND TREATMENT GOALS."

## 2019-07-17 NOTE — Assessment & Plan Note (Addendum)
Mild with some lid swelling this am/ now improved Reassuring exam  Cannot tell if viral vs bacterial based on exam and earlier picture inst to throw out current contacts and not put in new ones until complete resolution Handouts given to pt re: both Px neomycin/polymixin opth drops to use tid Update if not starting to improve in a week or if worsening  She will watch also for cold symptoms or change in vision

## 2019-07-25 ENCOUNTER — Encounter (INDEPENDENT_AMBULATORY_CARE_PROVIDER_SITE_OTHER): Payer: Self-pay | Admitting: Bariatrics

## 2019-07-25 ENCOUNTER — Telehealth (INDEPENDENT_AMBULATORY_CARE_PROVIDER_SITE_OTHER): Payer: BC Managed Care – PPO | Admitting: Bariatrics

## 2019-07-25 ENCOUNTER — Other Ambulatory Visit: Payer: Self-pay

## 2019-07-25 DIAGNOSIS — E559 Vitamin D deficiency, unspecified: Secondary | ICD-10-CM | POA: Diagnosis not present

## 2019-07-25 DIAGNOSIS — K5900 Constipation, unspecified: Secondary | ICD-10-CM | POA: Diagnosis not present

## 2019-07-25 DIAGNOSIS — Z6841 Body Mass Index (BMI) 40.0 and over, adult: Secondary | ICD-10-CM

## 2019-07-25 MED ORDER — VITAMIN D (ERGOCALCIFEROL) 1.25 MG (50000 UNIT) PO CAPS: 50000.0000 [IU] | ORAL_CAPSULE | ORAL | 0 refills | Status: DC

## 2019-07-25 MED ORDER — POLYETHYLENE GLYCOL 3350 17 GM/SCOOP PO POWD: 17.0000 g | Freq: Every day | ORAL | 0 refills | Status: DC

## 2019-07-29 NOTE — Progress Notes (Signed)
Office: [phone]  /  Fax: [phone] TeleHealth Visit:  Brittney [name] has verbally consented to this TeleHealth visit today. The patient is located at home, the provider is located at the [employer] [facility] office. The participants in this visit include the listed provider and patient. The visit was conducted today via Webex.  HPI:  Chief Complaint: OBESITY Naphtali is here to discuss her progress with her obesity treatment plan. She is keeping a food journal with 1500 calories and 100 grams of protein and states she is following her eating plan approximately 100% of the time. She states she is lifting weights and doing workout videos.   Surah states that she has lost weight (weight 259). She states she has been adherent to the plan and has cut her carbohydrates.  Today's visit was #9 Starting weight: 266 lbs Starting date: 03/07/2019  Saydee feels as if she has lost weight since her last in-office visit.  Irritable Bowel Syndrome (IBS) Ayaan has a diagnosis of IBS and is having constipation with a history of chronic diarrhea. She reports having internal and external hemorrhoids.  Vitamin D deficiency Bobbe has a diagnosis of Vitamin D deficiency and reports no nausea, vomiting, or muscle weakness. Her last Vitamin D was 16.62 on 02/21/2019.  ASSESSMENT AND PLAN:  Vitamin D deficiency - Plan: Vitamin D, Ergocalciferol, (DRISDOL) 1.25 MG (50000 UT) CAPS capsule  Constipation, unspecified constipation type  Class 3 severe obesity with serious comorbidity and body mass index (BMI) of 40.0 to 44.9 in adult, unspecified obesity type (HCC)  PLAN:  Irritable Bowel Syndrome (IBS) Takiera was instructed to increase her water intake, raw fruits and vegetables. She was given a prescription for MiraLax 17 gm/scoop 3350 grams take 17 grams 1 daily with 0 refills. She agrees to follow-up with our clinic in 2-4 weeks. She will use Dulcolax  (liquid) fast-acting.  Vitamin D Deficiency Dorothee  was informed that low Vitamin D levels contributes to fatigue and are associated with obesity, breast, and colon cancer. She agrees to continue to take prescription Vit D @ 50,000 IU every week and will follow-up for routine testing of Vitamin D, at least 2-3 times per year. She was informed of the risk of over-replacement of Vitamin D and agrees to not increase her dose unless she discusses this with [place] first. Yanisa agrees to follow-up with our clinic in 2-4 weeks.  Obesity Lianni is currently in the action stage of change. As such, her goal is to continue with weight loss efforts. She has agreed to keep a food journal with 1500 calories and 100 grams of protein. Consetta will work on meal planning and intentional eating.  Greydis has been instructed to continue doing exercises, weights and workout videos for weight loss and overall health benefits. We discussed the following Behavioral Modification Strategies today: increasing lean protein intake, decreasing simple carbohydrates, increasing vegetables, increase H20 intake, decrease eating out, no skipping meals, work on meal planning and easy cooking plans, and keeping healthy foods in the home.  Devorah has agreed to follow-up with our clinic in 2-4 weeks. She was informed of the importance of frequent follow-up visits to maximize her success with intensive lifestyle modifications for her multiple health conditions.  ALLERGIES: Allergies  Allergen Reactions  . Prevacid [Lansoprazole] Rash    MEDICATIONS: Current Outpatient Medications on File Prior to Visit  Medication Sig Dispense Refill  . dexamethasone (DECADRON) 0.1 % ophthalmic solution Place 1 drop into the right eye 3 (three) times  daily. 5 mL 0  . fluticasone (FLONASE) 50 MCG/ACT nasal spray Place 2 sprays into both nostrils daily as needed (for allergies.). 48 g 3  . hydrocortisone (ANUSOL-HC) 25 MG suppository Place 1 suppository (25 mg total) rectally 2 (two) times daily as needed for hemorrhoids  or anal itching. 12 suppository 1  . ibuprofen (ADVIL,MOTRIN) 600 MG tablet Take 1 tablet (600 mg total) by mouth every 8 (eight) hours as needed for fever, headache, mild pain or moderate pain. With food 45 tablet 1  . levocetirizine (XYZAL) 5 MG tablet Take 1 tablet (5 mg total) by mouth every evening. 90 tablet 3  . metFORMIN (GLUCOPHAGE) 500 MG tablet Take 1 tablet (500 mg total) by mouth daily with breakfast. 30 tablet 0  . montelukast (SINGULAIR) 10 MG tablet Take 1 tablet (10 mg total) by mouth at bedtime. 90 tablet 3  . sertraline (ZOLOFT) 25 MG tablet TAKE 1 TABLET BY MOUTH EVERY DAY 90 tablet 1  . triamcinolone cream (KENALOG) 0.1 % Apply 1 application topically 2 (two) times daily as needed (for Eczema).    . vitamin B-12 (CYANOCOBALAMIN) 1000 MCG tablet Take 1,000 mcg by mouth daily.     No current facility-administered medications on file prior to visit.    PAST MEDICAL HISTORY: Past Medical History:  Diagnosis Date  . Anemia   . B12 deficiency   . Back pain   . Colon polyps   . Coronary artery abnormality    spasms   . Coronary artery spasm [facility])    reports saw cardiologist for chest pains , was told she was having coronary artery spasms , sent for cardio w/u with stress and echo  both unremarkable. reports today has not had any spasms or chest pains in over 2 years    . Fibroids   . Gallstones   . Irritable bowel syndrome   . Joint pain   . Lactose intolerance   . Migraine    hasnt had one in a long time  . Obesity, unspecified   . Vitamin D deficiency     PAST SURGICAL HISTORY: Past Surgical History:  Procedure Laterality Date  . CHOLECYSTECTOMY  05/06/11  . COLONOSCOPY  11/2001   polyp; negative pathology  . COLONOSCOPY  09/2004   Negative  . COLONOSCOPY  2017 or 2018 patient  unsure   [facility] ;   . COLONOSCOPY W/ POLYPECTOMY    . CYSTOSCOPY N/A 01/03/2018   Procedure: CYSTOSCOPY;  Surgeon: Bobbye Charleston, MD;  Location: [facility];  Service: Gynecology;   Laterality: N/A;  . ESOPHAGOGASTRODUODENOSCOPY  1/10   normal  . FLEXIBLE SIGMOIDOSCOPY  03/05/2012   Procedure: FLEXIBLE SIGMOIDOSCOPY;  Surgeon: Inda Castle, MD;  [facility];  Service: Endoscopy;  Laterality: N/A;  . LAPAROSCOPIC DECORTICATION / DUBULKING / ABLATION RENAL CYSTS     saline histogram  . ROBOTIC ASSISTED LAPAROSCOPIC HYSTERECTOMY AND SALPINGECTOMY Bilateral 01/03/2018   Procedure: XI ROBOTIC ASSISTED LAPAROSCOPIC HYSTERECTOMY AND SALPINGECTOMY;  Surgeon: Bobbye Charleston, MD;  Location: [facility];  Service: Gynecology;  Laterality: Bilateral;  WITH BED AFTER  . TONSILLECTOMY  07/30/07  . Uterine US  09/2005   Large endometrial stripe; small ovarian cyst    SOCIAL HISTORY: Social History   Tobacco Use  . Smoking status: Never Smoker  . Smokeless tobacco: Never Used  Substance Use Topics  . Alcohol use: No    Alcohol/week: 0.0 standard drinks  . Drug use: No    FAMILY HISTORY: Family History  Problem Relation Age of Onset  . Heart disease Father   . Hypertension Father   . Heart attack Father   . Diabetes Father   . Sleep apnea Mother   . Obesity Mother   . Ulcers Brother   . Clotting disorder Brother   . Ovarian cancer Maternal Grandmother   . Colon polyps Maternal Grandmother   . Colon cancer Maternal Grandmother        40's  . Diabetes Paternal Grandmother   . Hypertension Paternal Grandmother   . Breast cancer Maternal Aunt        Age 66's  . Diabetes Paternal Aunt        x 5  . Lung cancer Maternal Grandfather        SMOKER   ROS: Review of Systems  Gastrointestinal: Positive for constipation and diarrhea (history of chronic diarrhea). Negative for nausea and vomiting.       Positive for irritable bowel syndrome.  Musculoskeletal:       Negative for muscle weakness.   PHYSICAL EXAM: Last menstrual period 12/21/2017. There is no height or weight on file to calculate BMI.  She is in no acute distress.  RECENT LABS AND  TESTS: BMET    Component Value Date/Time   NA 138 02/21/2019 0752   NA 140 07/02/2014 1253   K 4.4 02/21/2019 0752   K 4.2 07/02/2014 1253   CL 104 02/21/2019 0752   CL 107 07/02/2014 1253   CO2 27 02/21/2019 0752   CO2 31 07/02/2014 1253   GLUCOSE 92 02/21/2019 0752   GLUCOSE 89 07/02/2014 1253   BUN 12 02/21/2019 0752   BUN 10 07/02/2014 1253   CREATININE 1.03 02/21/2019 0752   CREATININE 1.07 07/02/2014 1253   CREATININE 0.82 09/02/2013 1250   CALCIUM 9.0 02/21/2019 0752   CALCIUM 8.7 07/02/2014 1253   GFRNONAA >60 09/16/2016 1937   GFRNONAA >60 07/02/2014 1253   GFRNONAA >60 07/05/2013 1025   GFRAA >60 09/16/2016 1937   GFRAA >60 07/02/2014 1253   GFRAA >60 07/05/2013 1025   Lab Results  Component Value Date   HGBA1C 4.4 02/21/2019   HGBA1C 5.8 02/21/2019   Lab Results  Component Value Date   INSULIN 10.8 03/07/2019   CBC    Component Value Date/Time   WBC 8.2 02/21/2019 0752   RBC 5.10 02/21/2019 0752   HGB 13.6 02/21/2019 0752   HGB 11.4 (L) 07/02/2014 1253   HCT 42.8 02/21/2019 0752   HCT 36.1 07/02/2014 1253   PLT 272.0 02/21/2019 0752   PLT 260 07/02/2014 1253   MCV 84.1 02/21/2019 0752   MCV 81 07/02/2014 1253   MCH 24.6 (L) 12/22/2017 0850   MCHC 31.8 02/21/2019 0752   RDW 16.0 (H) 02/21/2019 0752   RDW 16.9 (H) 07/02/2014 1253   LYMPHSABS 2.6 02/21/2019 0752   LYMPHSABS 1.3 02/19/2012 0455   MONOABS 0.5 02/21/2019 0752   MONOABS 0.5 02/19/2012 0455   EOSABS 0.1 02/21/2019 0752   EOSABS 0.1 02/19/2012 0455   BASOSABS 0.1 02/21/2019 0752   BASOSABS 0.0 02/19/2012 0455   Iron/TIBC/Ferritin/ %Sat    Component Value Date/Time   IRON 152 (H) 09/20/2016 1428   FERRITIN 6.7 (L) 09/20/2016 1428   IRONPCTSAT 32.6 09/20/2016 1428   Lipid Panel     Component Value Date/Time   CHOL 176 03/07/2019 0957   TRIG 68 03/07/2019 0957   HDL 46 03/07/2019 0957   CHOLHDL 5 09/18/2018 1624   VLDL 13.4 09/18/2018  1624   LDLCALC 116 (H) 03/07/2019 0957    Hepatic Function Panel     Component Value Date/Time   PROT 6.7 02/21/2019 0752   PROT 7.0 07/02/2014 1253   ALBUMIN 4.0 02/21/2019 0752   ALBUMIN 3.5 07/02/2014 1253   AST 14 02/21/2019 0752   AST 26 07/02/2014 1253   ALT 14 02/21/2019 0752   ALT 25 07/02/2014 1253   ALKPHOS 71 02/21/2019 0752   ALKPHOS 93 07/02/2014 1253   BILITOT 0.5 02/21/2019 0752   BILITOT 0.4 07/02/2014 1253   BILIDIR 0.1 07/23/2008 1707      Component Value Date/Time   TSH 2.44 02/21/2019 0752   TSH 1.16 09/18/2018 1624   TSH 1.76 08/29/2017 1255    OBESITY BEHAVIORAL INTERVENTION VISIT DOCUMENTATION FOR INSURANCE (~15 minutes)  I, Michaelene Song, am acting as Location manager for [employer], DO  I have reviewed the above documentation for accuracy and completeness, and I agree with the above. Jearld Lesch, DO

## 2019-07-30 ENCOUNTER — Telehealth (INDEPENDENT_AMBULATORY_CARE_PROVIDER_SITE_OTHER): Payer: BC Managed Care – PPO | Admitting: Bariatrics

## 2019-08-06 ENCOUNTER — Telehealth (INDEPENDENT_AMBULATORY_CARE_PROVIDER_SITE_OTHER): Payer: BC Managed Care – PPO | Admitting: Bariatrics

## 2019-08-06 ENCOUNTER — Encounter (INDEPENDENT_AMBULATORY_CARE_PROVIDER_SITE_OTHER): Payer: Self-pay | Admitting: Bariatrics

## 2019-08-06 ENCOUNTER — Other Ambulatory Visit: Payer: Self-pay

## 2019-08-06 DIAGNOSIS — R7303 Prediabetes: Secondary | ICD-10-CM | POA: Diagnosis not present

## 2019-08-06 DIAGNOSIS — E559 Vitamin D deficiency, unspecified: Secondary | ICD-10-CM | POA: Diagnosis not present

## 2019-08-06 DIAGNOSIS — Z6841 Body Mass Index (BMI) 40.0 and over, adult: Secondary | ICD-10-CM | POA: Diagnosis not present

## 2019-08-06 MED ORDER — VITAMIN D (ERGOCALCIFEROL) 1.25 MG (50000 UNIT) PO CAPS: 50000.0000 [IU] | ORAL_CAPSULE | ORAL | 0 refills | Status: DC

## 2019-08-07 ENCOUNTER — Other Ambulatory Visit: Payer: BC Managed Care – PPO

## 2019-08-07 NOTE — Progress Notes (Signed)
Office: [phone]  /  Fax: [phone] TeleHealth Visit:  Brittney [name] has verbally consented to this TeleHealth visit today. The patient is located at home, the provider is located at the [employer] [facility] office. The participants in this visit include the listed provider and patient. The visit was conducted today via Webex.  HPI:  Chief Complaint: OBESITY Brittney [name] is here to discuss her progress with her obesity treatment plan. She is keeping a food journal with 1200-1500 calories and 90 grams of protein and states she is following her eating plan approximately 100% of the time. She states she is working with weights 45 minutes 4-5 times per week.  Brittney [name]'s weight remains the same. She states that her bowels are better. She reports doing well with her water and protein intake.  Today's visit was #10 Starting weight: 266 lbs Starting date: 03/07/2019  Vitamin D deficiency Brittney [name] has a diagnosis of Vitamin D deficiency and is taking prescription Vitamin D. No nausea, vomiting, or muscle weakness.  Prediabetes Brittney [name] has a diagnosis of prediabetes. She states she has stopped the metformin. Last A1c 4.4 on 02/21/2019 with an insulin of 10.8 on 03/07/2019.  ASSESSMENT AND PLAN:  Vitamin D deficiency - Plan: Vitamin D, Ergocalciferol, (DRISDOL) 1.25 MG (50000 UT) CAPS capsule  Prediabetes  Class 3 severe obesity with serious comorbidity and body mass index (BMI) of 40.0 to 44.9 in adult, unspecified obesity type ([place])  PLAN:  Vitamin D Deficiency Brittney [name] was informed that low Vitamin D levels contributes to fatigue and are associated with obesity, breast, and colon cancer. She agrees to continue to take prescription Vit D @ 50,000 IU every week #4 with 0 refills and will follow-up for routine testing of Vitamin D, at least 2-3 times per year. She was informed of the risk of over-replacement of Vitamin D and agrees to not increase her dose unless she discusses this with [place] first. Brittney [name]  agrees to follow-up with our clinic in 2-3 weeks.  Pre-Diabetes Brittney [name] will continue to work on weight loss, exercise, and decreasing simple carbohydrates to help decrease the risk of diabetes. She will continue metformin, decrease carbohydrates, increase protein and healthy fats.  Obesity Brittney [name] is currently in the action stage of change. As such, her goal is to continue with weight loss efforts. She has agreed to keep a food journal with 1200-1500 calories and 90 grams of protein. She was informed about the App "Carb Manager."  Brittney [name] will work on meal planning, intentional eating, and will decrease her carbohydrates and sweets. Brittney [name] has been instructed to work up to a goal of 150 minutes of combined cardio and strengthening exercise per week for weight loss and overall health benefits. We discussed the following Behavioral Modification Strategies today: increasing lean protein intake, decreasing simple carbohydrates, increasing vegetables, increase H20 intake, decrease eating out, no skipping meals, work on meal planning and easy cooking plans, and keeping healthy foods in the home.  Brittney [name] has agreed to follow-up with our clinic in 2-3 weeks. She was informed of the importance of frequent follow-up visits to maximize her success with intensive lifestyle modifications for her multiple health conditions.  ALLERGIES: Allergies  Allergen Reactions  . Prevacid [Lansoprazole] Rash    MEDICATIONS: Current Outpatient Medications on File Prior to Visit  Medication Sig Dispense Refill  . dexamethasone (DECADRON) 0.1 % ophthalmic solution Place 1 drop into the right eye 3 (three) times daily. 5 mL 0  . fluticasone (FLONASE) 50 MCG/ACT nasal spray Place 2  sprays into both nostrils daily as needed (for allergies.). 48 g 3  . hydrocortisone (ANUSOL-HC) 25 MG suppository Place 1 suppository (25 mg total) rectally 2 (two) times daily as needed for hemorrhoids or anal itching. 12 suppository 1  . ibuprofen  (ADVIL,MOTRIN) 600 MG tablet Take 1 tablet (600 mg total) by mouth every 8 (eight) hours as needed for fever, headache, mild pain or moderate pain. With food 45 tablet 1  . levocetirizine (XYZAL) 5 MG tablet Take 1 tablet (5 mg total) by mouth every evening. 90 tablet 3  . metFORMIN (GLUCOPHAGE) 500 MG tablet Take 1 tablet (500 mg total) by mouth daily with breakfast. 30 tablet 0  . montelukast (SINGULAIR) 10 MG tablet Take 1 tablet (10 mg total) by mouth at bedtime. 90 tablet 3  . polyethylene glycol powder (GLYCOLAX/MIRALAX) 17 GM/SCOOP powder Take 17 g by mouth daily. 3350 g 0  . sertraline (ZOLOFT) 25 MG tablet TAKE 1 TABLET BY MOUTH EVERY DAY 90 tablet 1  . triamcinolone cream (KENALOG) 0.1 % Apply 1 application topically 2 (two) times daily as needed (for Eczema).    . vitamin B-12 (CYANOCOBALAMIN) 1000 MCG tablet Take 1,000 mcg by mouth daily.     No current facility-administered medications on file prior to visit.    PAST MEDICAL HISTORY: Past Medical History:  Diagnosis Date  . Anemia   . B12 deficiency   . Back pain   . Colon polyps   . Coronary artery abnormality    spasms   . 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 reports today has not had any spasms or chest pains in over 2 years    . Fibroids   . Gallstones   . Irritable bowel syndrome   . Joint pain   . Lactose intolerance   . Migraine    hasnt had one in a long time  . Obesity, unspecified   . Vitamin D deficiency     PAST SURGICAL HISTORY: Past Surgical History:  Procedure Laterality Date  . CHOLECYSTECTOMY  05/06/11  . COLONOSCOPY  11/2001   polyp; negative pathology  . COLONOSCOPY  09/2004   Negative  . COLONOSCOPY  2017 or 2018 patient  unsure   [facility] ;   . COLONOSCOPY W/ POLYPECTOMY    . CYSTOSCOPY N/A 01/03/2018   Procedure: CYSTOSCOPY;  Surgeon: Bobbye Charleston, MD;  Location: [facility];  Service: Gynecology;  Laterality: N/A;  . ESOPHAGOGASTRODUODENOSCOPY  1/10   normal  . FLEXIBLE SIGMOIDOSCOPY  03/05/2012   Procedure: FLEXIBLE SIGMOIDOSCOPY;  Surgeon: Inda Castle, MD;  Location: [facility];  Service: Endoscopy;  Laterality: N/A;  . LAPAROSCOPIC DECORTICATION / DUBULKING / ABLATION RENAL CYSTS     saline histogram  . ROBOTIC ASSISTED LAPAROSCOPIC HYSTERECTOMY AND SALPINGECTOMY Bilateral 01/03/2018   Procedure: XI ROBOTIC ASSISTED LAPAROSCOPIC HYSTERECTOMY AND SALPINGECTOMY;  Surgeon: Bobbye Charleston, MD;  Location: [facility];  Service: Gynecology;  Laterality: Bilateral;  WITH BED AFTER  . TONSILLECTOMY  07/30/07  . Uterine US  09/2005   Large endometrial stripe; small ovarian cyst    SOCIAL HISTORY: Social History   Tobacco Use  . Smoking status: Never Smoker  . Smokeless tobacco: Never Used  Substance Use Topics  . Alcohol use: No    Alcohol/week: 0.0 standard drinks  . Drug use: No  FAMILY HISTORY: Family History  Problem Relation Age of Onset  . Heart disease Father   . Hypertension Father   . Heart attack Father   . Diabetes Father   . Sleep apnea Mother   . Obesity Mother   . Ulcers Brother   . Clotting disorder Brother   . Ovarian cancer Maternal Grandmother   . Colon polyps Maternal Grandmother   . Colon cancer Maternal Grandmother        40's  . Diabetes Paternal Grandmother   . Hypertension Paternal Grandmother   . Breast cancer Maternal Aunt        Age 42's  . Diabetes Paternal Aunt        x 5  . Lung cancer Maternal Grandfather        SMOKER   ROS: Review of Systems  Gastrointestinal: Negative for nausea and vomiting.  Musculoskeletal:       Negative for muscle weakness.   PHYSICAL EXAM: Last menstrual period 12/21/2017. There is no height or weight on file to calculate BMI. Physical Exam: Pt in no acute distress.  RECENT LABS AND TESTS: BMET    Component Value Date/Time   NA 138  02/21/2019 0752   NA 140 07/02/2014 1253   K 4.4 02/21/2019 0752   K 4.2 07/02/2014 1253   CL 104 02/21/2019 0752   CL 107 07/02/2014 1253   CO2 27 02/21/2019 0752   CO2 31 07/02/2014 1253   GLUCOSE 92 02/21/2019 0752   GLUCOSE 89 07/02/2014 1253   BUN 12 02/21/2019 0752   BUN 10 07/02/2014 1253   CREATININE 1.03 02/21/2019 0752   CREATININE 1.07 07/02/2014 1253   CREATININE 0.82 09/02/2013 1250   CALCIUM 9.0 02/21/2019 0752   CALCIUM 8.7 07/02/2014 1253   GFRNONAA >60 09/16/2016 1937   GFRNONAA >60 07/02/2014 1253   GFRNONAA >60 07/05/2013 1025   GFRAA >60 09/16/2016 1937   GFRAA >60 07/02/2014 1253   GFRAA >60 07/05/2013 1025   Lab Results  Component Value Date   HGBA1C 4.4 02/21/2019   HGBA1C 5.8 02/21/2019   Lab Results  Component Value Date   INSULIN 10.8 03/07/2019   CBC    Component Value Date/Time   WBC 8.2 02/21/2019 0752   RBC 5.10 02/21/2019 0752   HGB 13.6 02/21/2019 0752   HGB 11.4 (L) 07/02/2014 1253   HCT 42.8 02/21/2019 0752   HCT 36.1 07/02/2014 1253   PLT 272.0 02/21/2019 0752   PLT 260 07/02/2014 1253   MCV 84.1 02/21/2019 0752   MCV 81 07/02/2014 1253   MCH 24.6 (L) 12/22/2017 0850   MCHC 31.8 02/21/2019 0752   RDW 16.0 (H) 02/21/2019 0752   RDW 16.9 (H) 07/02/2014 1253   LYMPHSABS 2.6 02/21/2019 0752   LYMPHSABS 1.3 02/19/2012 0455   MONOABS 0.5 02/21/2019 0752   MONOABS 0.5 02/19/2012 0455   EOSABS 0.1 02/21/2019 0752   EOSABS 0.1 02/19/2012 0455   BASOSABS 0.1 02/21/2019 0752   BASOSABS 0.0 02/19/2012 0455   Iron/TIBC/Ferritin/ %Sat    Component Value Date/Time   IRON 152 (H) 09/20/2016 1428   FERRITIN 6.7 (L) 09/20/2016 1428   IRONPCTSAT 32.6 09/20/2016 1428   Lipid Panel     Component Value Date/Time   CHOL 176 03/07/2019 0957   TRIG 68 03/07/2019 0957   HDL 46 03/07/2019 0957   CHOLHDL 5 09/18/2018 1624   VLDL 13.4 09/18/2018 1624   LDLCALC 116 (H) 03/07/2019 0957   Hepatic Function Panel  Component Value  Date/Time   PROT 6.7 02/21/2019 0752   PROT 7.0 07/02/2014 1253   ALBUMIN 4.0 02/21/2019 0752   ALBUMIN 3.5 07/02/2014 1253   AST 14 02/21/2019 0752   AST 26 07/02/2014 1253   ALT 14 02/21/2019 0752   ALT 25 07/02/2014 1253   ALKPHOS 71 02/21/2019 0752   ALKPHOS 93 07/02/2014 1253   BILITOT 0.5 02/21/2019 0752   BILITOT 0.4 07/02/2014 1253   BILIDIR 0.1 07/23/2008 1707      Component Value Date/Time   TSH 2.44 02/21/2019 0752   TSH 1.16 09/18/2018 1624   TSH 1.76 08/29/2017 1255    I, Michaelene Song, am acting as Location manager for [employer], DO  I have reviewed the above documentation for accuracy and completeness, and I agree with the above. Jearld Lesch, DO

## 2019-08-28 ENCOUNTER — Encounter (INDEPENDENT_AMBULATORY_CARE_PROVIDER_SITE_OTHER): Payer: Self-pay

## 2019-08-29 ENCOUNTER — Encounter (INDEPENDENT_AMBULATORY_CARE_PROVIDER_SITE_OTHER): Payer: Self-pay | Admitting: Bariatrics

## 2019-08-29 ENCOUNTER — Other Ambulatory Visit: Payer: Self-pay

## 2019-08-29 ENCOUNTER — Telehealth (INDEPENDENT_AMBULATORY_CARE_PROVIDER_SITE_OTHER): Payer: BC Managed Care – PPO | Admitting: Bariatrics

## 2019-08-29 DIAGNOSIS — E559 Vitamin D deficiency, unspecified: Secondary | ICD-10-CM | POA: Diagnosis not present

## 2019-08-29 DIAGNOSIS — K5901 Slow transit constipation: Secondary | ICD-10-CM

## 2019-08-29 DIAGNOSIS — Z6841 Body Mass Index (BMI) 40.0 and over, adult: Secondary | ICD-10-CM | POA: Diagnosis not present

## 2019-08-29 MED ORDER — VITAMIN D (ERGOCALCIFEROL) 1.25 MG (50000 UNIT) PO CAPS: 50000.0000 [IU] | ORAL_CAPSULE | ORAL | 0 refills | Status: DC

## 2019-08-31 NOTE — Progress Notes (Signed)
TeleHealth Visit:  Due to the COVID-19 pandemic, this visit was completed with telemedicine (audio/video) technology to reduce patient and provider exposure as well as to preserve personal protective equipment.   Brittney [name] has verbally consented to this TeleHealth visit. The patient is located at home, the provider is located at the [employer] [facility] office. The participants in this visit include the listed provider and patient. The visit was conducted today via Webex.  Chief Complaint: OBESITY Brittney [name] is here to discuss her progress with her obesity treatment plan along with follow-up of her obesity related diagnoses. Brittney [name] is keeping a food journal and adhering to recommended goals of 1200 calories and 100+g of protein daily and states she is following her eating plan approximately 100% of the time. Brittney [name] states she is walking and weight training 60 minutes 4-5 times per week.  Today's visit was #: 11 Starting weight: 266 lbs Starting date: 03/07/2019  Interim History: Brittney [name] states that she is down 4-5 lbs (Wt 258 lbs). She has been more adherent to the plan. She has done some intermittent fasting.   Subjective:   1. Vitamin D deficiency Brittney [name]'s Vitamin D level was 16.62 on 02/21/2019. She is currently taking vit D. She denies nausea, vomiting or muscle weakness.  2. Slow transit constipation Brittney [name] notes constipation. She reports it has improved. MCT and CLA oil has helped.    Assessment/Plan:   1. Vitamin D deficiency Low Vitamin D level contributes to fatigue and are associated with obesity, breast, and colon cancer. She agrees to continue to take prescription Vitamin D @50 ,000 IU every week and will follow-up for routine testing of Vitamin D, at least 2-3 times per year to avoid over-replacement. - Vitamin D, Ergocalciferol, (DRISDOL) 1.25 MG (50000 UNIT) CAPS capsule; Take 1 capsule (50,000 Units total) by mouth every 7 (seven) days.  Dispense: 4 capsule; Refill: 0  2. Slow transit  constipation Brittney [name] was informed that a decrease in bowel movement frequency is normal while losing weight, but stools should not be hard or painful. Orders and follow up as documented in patient record.  3. Class 3 severe obesity with serious comorbidity and body mass index (BMI) of 40.0 to 44.9 in adult, unspecified obesity type ([facility]) Brittney [name] is currently in the action stage of change. As such, her goal is to continue with weight loss efforts. She has agreed to keeping a food journal and adhering to recommended goals of 1200 calories and 100+g of protein daily. We discussed meal planning and intentional eating.   Exercise goals: Continue exercise (weights/walking)  Behavioral modification strategies: increasing lean protein intake, decreasing simple carbohydrates, increasing vegetables, increasing water intake, decreasing eating out, no skipping meals, meal planning and cooking strategies and keeping healthy foods in the home.  Brittney [name] has agreed to follow-up with our clinic in 2 weeks. She was informed of the importance of frequent follow-up visits to maximize her success with intensive lifestyle modifications for her multiple health conditions.  Objective:   VITALS: Per patient if applicable, see vitals. GENERAL: Alert and in no acute distress. CARDIOPULMONARY: No increased WOB. Speaking in clear sentences.  PSYCH: Pleasant and cooperative. Speech normal rate and rhythm. Affect is appropriate. Insight and judgement are appropriate. Attention is focused, linear, and appropriate.  NEURO: Oriented as arrived to appointment on time with no prompting.   Lab Results  Component Value Date   CREATININE 1.03 02/21/2019   BUN 12 02/21/2019   NA 138 02/21/2019   K 4.4 02/21/2019  CL 104 02/21/2019   CO2 27 02/21/2019   Lab Results  Component Value Date   ALT 14 02/21/2019   AST 14 02/21/2019   ALKPHOS 71 02/21/2019   BILITOT 0.5 02/21/2019   Lab Results  Component Value Date   HGBA1C 4.4  02/21/2019   HGBA1C 5.8 02/21/2019   Lab Results  Component Value Date   INSULIN 10.8 03/07/2019   Lab Results  Component Value Date   TSH 2.44 02/21/2019   Lab Results  Component Value Date   CHOL 176 03/07/2019   HDL 46 03/07/2019   LDLCALC 116 (H) 03/07/2019   TRIG 68 03/07/2019   CHOLHDL 5 09/18/2018   Lab Results  Component Value Date   WBC 8.2 02/21/2019   HGB 13.6 02/21/2019   HCT 42.8 02/21/2019   MCV 84.1 02/21/2019   PLT 272.0 02/21/2019   Lab Results  Component Value Date   IRON 152 (H) 09/20/2016   FERRITIN 6.7 (L) 09/20/2016    Attestation Statements:   Reviewed by clinician on day of visit: allergies, medications, problem list, medical history, surgical history, family history, social history, and previous encounter notes.  I, Brittney [name], am acting as Location manager for [employer], DO .  I have reviewed the above documentation for accuracy and completeness, and I agree with the above. Brittney Lesch, DO

## 2019-09-12 ENCOUNTER — Telehealth (INDEPENDENT_AMBULATORY_CARE_PROVIDER_SITE_OTHER): Payer: BC Managed Care – PPO | Admitting: Bariatrics

## 2019-09-16 ENCOUNTER — Telehealth: Payer: Self-pay | Admitting: Family Medicine

## 2019-09-16 DIAGNOSIS — E559 Vitamin D deficiency, unspecified: Secondary | ICD-10-CM

## 2019-09-16 DIAGNOSIS — Z0000 Encounter for general adult medical examination without abnormal findings: Secondary | ICD-10-CM

## 2019-09-16 NOTE — Telephone Encounter (Signed)
-----   Message from Cloyd Stagers, RT sent at 09/06/2019  1:51 PM EST ----- Regarding: Lab Orders forTuesday 2.2.2021 Please place lab orders for Tuesday 2.2.2021, office visit for physical on Tuesday 2.9.2021 Thank you, Dyke Maes RT(R)

## 2019-09-17 ENCOUNTER — Other Ambulatory Visit: Payer: Self-pay

## 2019-09-17 ENCOUNTER — Other Ambulatory Visit (INDEPENDENT_AMBULATORY_CARE_PROVIDER_SITE_OTHER): Payer: BC Managed Care – PPO

## 2019-09-17 DIAGNOSIS — E559 Vitamin D deficiency, unspecified: Secondary | ICD-10-CM

## 2019-09-17 DIAGNOSIS — Z0000 Encounter for general adult medical examination without abnormal findings: Secondary | ICD-10-CM

## 2019-09-18 LAB — CBC WITH DIFFERENTIAL/PLATELET
Basophils Absolute: 0.1 10*3/uL (ref 0.0–0.1)
Basophils Relative: 0.8 % (ref 0.0–3.0)
Eosinophils Absolute: 0.1 10*3/uL (ref 0.0–0.7)
Eosinophils Relative: 1.2 % (ref 0.0–5.0)
HCT: 40 % (ref 36.0–46.0)
Hemoglobin: 13.1 g/dL (ref 12.0–15.0)
Lymphocytes Relative: 31.5 % (ref 12.0–46.0)
Lymphs Abs: 2.5 10*3/uL (ref 0.7–4.0)
MCHC: 32.6 g/dL (ref 30.0–36.0)
MCV: 83.8 fl (ref 78.0–100.0)
Monocytes Absolute: 0.5 10*3/uL (ref 0.1–1.0)
Monocytes Relative: 6.7 % (ref 3.0–12.0)
Neutro Abs: 4.8 10*3/uL (ref 1.4–7.7)
Neutrophils Relative %: 59.8 % (ref 43.0–77.0)
Platelets: 261 10*3/uL (ref 150.0–400.0)
RBC: 4.78 Mil/uL (ref 3.87–5.11)
RDW: 15.9 % — ABNORMAL HIGH (ref 11.5–15.5)
WBC: 8 10*3/uL (ref 4.0–10.5)

## 2019-09-18 LAB — VITAMIN D 25 HYDROXY (VIT D DEFICIENCY, FRACTURES): VITD: 41.92 ng/mL (ref 30.00–100.00)

## 2019-09-18 LAB — COMPREHENSIVE METABOLIC PANEL
ALT: 14 U/L (ref 0–35)
AST: 13 U/L (ref 0–37)
Albumin: 4 g/dL (ref 3.5–5.2)
Alkaline Phosphatase: 54 U/L (ref 39–117)
BUN: 9 mg/dL (ref 6–23)
CO2: 25 mEq/L (ref 19–32)
Calcium: 9.1 mg/dL (ref 8.4–10.5)
Chloride: 106 mEq/L (ref 96–112)
Creatinine, Ser: 1 mg/dL (ref 0.40–1.20)
GFR: 74.11 mL/min (ref >60.00)
Glucose, Bld: 80 mg/dL (ref 70–99)
Potassium: 4 mEq/L (ref 3.5–5.1)
Sodium: 138 mEq/L (ref 135–145)
Total Bilirubin: 0.5 mg/dL (ref 0.2–1.2)
Total Protein: 6.5 g/dL (ref 6.0–8.3)

## 2019-09-18 LAB — LIPID PANEL
Cholesterol: 168 mg/dL (ref 0–200)
HDL: 40.5 mg/dL (ref >39.00)
LDL Cholesterol: 118 mg/dL — ABNORMAL HIGH (ref 0–99)
NonHDL: 127.76
Total CHOL/HDL Ratio: 4
Triglycerides: 47 mg/dL (ref 0.0–149.0)
VLDL: 9.4 mg/dL (ref 0.0–40.0)

## 2019-09-18 LAB — TSH: TSH: 0.81 u[IU]/mL (ref 0.35–4.50)

## 2019-09-24 ENCOUNTER — Other Ambulatory Visit: Payer: Self-pay

## 2019-09-24 ENCOUNTER — Ambulatory Visit (INDEPENDENT_AMBULATORY_CARE_PROVIDER_SITE_OTHER): Payer: BC Managed Care – PPO | Admitting: Family Medicine

## 2019-09-24 ENCOUNTER — Encounter: Payer: Self-pay | Admitting: Family Medicine

## 2019-09-24 ENCOUNTER — Encounter: Payer: BC Managed Care – PPO | Admitting: Family Medicine

## 2019-09-24 VITALS — BP 108/66 | HR 57 | Temp 97.5°F | Ht 67.0 in | Wt 260.2 lb

## 2019-09-24 DIAGNOSIS — I201 Angina pectoris with documented spasm: Secondary | ICD-10-CM | POA: Diagnosis not present

## 2019-09-24 DIAGNOSIS — E559 Vitamin D deficiency, unspecified: Secondary | ICD-10-CM

## 2019-09-24 DIAGNOSIS — Z0000 Encounter for general adult medical examination without abnormal findings: Secondary | ICD-10-CM | POA: Diagnosis not present

## 2019-09-24 MED ORDER — MONTELUKAST SODIUM 10 MG PO TABS: 10.0000 mg | ORAL_TABLET | Freq: Every day | ORAL | 3 refills | Status: DC

## 2019-09-24 MED ORDER — FLUTICASONE PROPIONATE 50 MCG/ACT NA SUSP: 2.0000 | Freq: Every day | NASAL | 3 refills | Status: DC | PRN

## 2019-09-24 MED ORDER — LEVOCETIRIZINE DIHYDROCHLORIDE 5 MG PO TABS: 5.0000 mg | ORAL_TABLET | Freq: Every evening | ORAL | 3 refills | Status: DC

## 2019-09-24 MED ORDER — TRIAMCINOLONE ACETONIDE 0.1 % EX CREA: 1.0000 "application " | TOPICAL_CREAM | Freq: Two times a day (BID) | CUTANEOUS | 3 refills | Status: DC | PRN

## 2019-09-24 NOTE — Assessment & Plan Note (Signed)
Reviewed health habits including diet and exercise and skin cancer prevention Reviewed appropriate screening tests for age  Also reviewed health mt list, fam hx and immunization status , as well as social and family history   See HPI Labs reviewed -stable Enc further wt loss with her program  Has had her mammogram  Plans on getting a covid vaccine when available

## 2019-09-24 NOTE — Assessment & Plan Note (Signed)
Discussed how this problem influences overall health and the risks it imposes  Reviewed plan for weight loss with lower calorie diet (via better food choices and also portion control or program like weight watchers) and exercise building up to or more than 30 minutes 5 days per week including some aerobic activity   Commended on wt loss so far with the healthy wt and wellness program

## 2019-09-24 NOTE — Patient Instructions (Signed)
Keep up the good work with diet and exercise   I refilled your cream for eczema  Keep using moisturizers   Labs are stable   Vit D level is in the normal range

## 2019-09-24 NOTE — Assessment & Plan Note (Signed)
No re occurances

## 2019-09-24 NOTE — Assessment & Plan Note (Signed)
Level of 41.9 with current high dose weekly tx from the healthy weight clinic

## 2019-09-24 NOTE — Progress Notes (Signed)
Subjective:    Patient ID: Brittney [name], female    DOB: 23-Feb-1981, 39 y.o.   MRN: [id number]  This visit occurred during the SARS-CoV-2 public health emergency.  Safety protocols were in place, including screening questions prior to the visit, additional usage of staff PPE, and extensive cleaning of exam room while observing appropriate contact time as indicated for disinfecting solutions.    HPI Here for health maintenance exam and to review chronic medical problems  Working a lot  Doing ok overall     Wt Readings from Last 3 Encounters:  09/24/19 260 lb 4 oz (118 kg)  07/17/19 263 lb 5 oz (119.4 kg)  06/28/19 265 lb (120.2 kg)   40.76 kg/m  She continues to attend the healthy weight and wellness clinic -it has been helpful  Continues to loose weight   Regular exercise most of the time  Loves lifting weights - got a bench for xmas   Colonoscopy 2/18 with 5 y recall  Colon cancer in [facility]  Tdap 1/19   Flu 10/20  Waiting for her covid vaccine    Pap 2/16 -then had a hysterectomy with salpingectomy  No gyn problems   Mammogram 10/20 Self breast exam- no lumps or changes   BP Readings from Last 3 Encounters:  09/24/19 108/66  07/17/19 128/78  06/28/19 108/68   Pulse Readings from Last 3 Encounters:  09/24/19 (!) 57  07/17/19 61  06/28/19 64   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    H/o vit D def Level 41.9 today with oral supplement Taking high dose weekly therapy   Cholesterol  Lab Results  Component Value Date   CHOL 168 09/17/2019   CHOL 176 03/07/2019   CHOL 155 09/18/2018   Lab Results  Component Value Date   HDL 40.50 09/17/2019   HDL 46 03/07/2019   HDL 33.70 (L) 09/18/2018   Lab Results  Component Value Date   LDLCALC 118 (H) 09/17/2019   LDLCALC 116 (H) 03/07/2019   LDLCALC 108 (H) 09/18/2018   Lab Results  Component Value Date   TRIG 47.0 09/17/2019   TRIG 68 03/07/2019   TRIG 67.0 09/18/2018   Lab Results  Component  Value Date   CHOLHDL 4 09/17/2019   CHOLHDL 5 09/18/2018   CHOLHDL 4 08/29/2017   No results found for: LDLDIRECT Overall stable  Eating really healthy   Other labs: Results for orders placed or performed in visit on 09/17/19  TSH  Result Value Ref Range   TSH 0.81 0.35 - 4.50 uIU/mL  VITAMIN D 25 Hydroxy (Vit-D Deficiency, Fractures)  Result Value Ref Range   VITD 41.92 30.00 - 100.00 ng/mL  Lipid panel  Result Value Ref Range   Cholesterol 168 0 - 200 mg/dL   Triglycerides 47.0 0.0 - 149.0 mg/dL   HDL 40.50 >39.00 mg/dL   VLDL 9.4 0.0 - 40.0 mg/dL   LDL Cholesterol 118 (H) 0 - 99 mg/dL   Total CHOL/HDL Ratio 4    NonHDL 127.76   CBC with Differential/Platelet  Result Value Ref Range   WBC 8.0 4.0 - 10.5 K/uL   RBC 4.78 3.87 - 5.11 Mil/uL   Hemoglobin 13.1 12.0 - 15.0 g/dL   HCT 40.0 36.0 - 46.0 %   MCV 83.8 78.0 - 100.0 fl   MCHC 32.6 30.0 - 36.0 g/dL   RDW 15.9 (H) 11.5 - 15.5 %   Platelets 261.0 150.0 - 400.0 K/uL   Neutrophils Relative %  59.8 43.0 - 77.0 %   Lymphocytes Relative 31.5 12.0 - 46.0 %   Monocytes Relative 6.7 3.0 - 12.0 %   Eosinophils Relative 1.2 0.0 - 5.0 %   Basophils Relative 0.8 0.0 - 3.0 %   Neutro Abs 4.8 1.4 - 7.7 K/uL   Lymphs Abs 2.5 0.7 - 4.0 K/uL   Monocytes Absolute 0.5 0.1 - 1.0 K/uL   Eosinophils Absolute 0.1 0.0 - 0.7 K/uL   Basophils Absolute 0.1 0.0 - 0.1 K/uL  Comprehensive metabolic panel  Result Value Ref Range   Sodium 138 135 - 145 mEq/L   Potassium 4.0 3.5 - 5.1 mEq/L   Chloride 106 96 - 112 mEq/L   CO2 25 19 - 32 mEq/L   Glucose, Bld 80 70 - 99 mg/dL   BUN 9 6 - 23 mg/dL   Creatinine, Ser 1.00 0.40 - 1.20 mg/dL   Total Bilirubin 0.5 0.2 - 1.2 mg/dL   Alkaline Phosphatase 54 39 - 117 U/L   AST 13 0 - 37 U/L   ALT 14 0 - 35 U/L   Total Protein 6.5 6.0 - 8.3 g/dL   Albumin 4.0 3.5 - 5.2 g/dL   GFR 74.11 >60.00 mL/min   Calcium 9.1 8.4 - 10.5 mg/dL    Patient Active Problem List   Diagnosis Date Noted  . Vitamin  D deficiency 09/16/2019  . Mild reactive airways disease 03/20/2019  . Hot flashes 02/20/2019  . Constipation 02/20/2019  . Fatigue 02/20/2019  . Right knee pain 10/17/2018  . Benign paroxysmal positional vertigo 05/16/2018  . Postoperative state 01/03/2018  . Lipoma of back 10/04/2017  . Routine general medical examination at a health care facility 08/29/2017  . Fibrocystic breast changes 01/08/2016  . Hemorrhoids, external 09/30/2014  . Chronic diarrhea 08/29/2014  . 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 Hemorrhoids, internal, with bleeding 04/22/2014  . Family history of pulmonary embolism 09/02/2013  . Family history of diabetes mellitus 09/02/2013  . Cutaneous skin tags 05/15/2013  . 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 Morbid obesity ([place]) 03/31/2008  . HEARING LOSS, MILD 03/31/2008  . MIGRAINE HEADACHE 02/20/2007  . IBS 02/20/2007  . OSTEOARTHRITIS 02/20/2007   Past Medical History:  Diagnosis Date  . Anemia   . B12 deficiency   . Back pain   . Colon polyps   . Coronary artery abnormality    spasms   . Coronary artery spasm [facility])    reports saw cardiologist for chest pains , was told she was having coronary artery spasms , sent for cardio w/u with stress and echo  both unremarkable. reports today has not had any spasms or chest pains in over 2 years    . Fibroids   . Gallstones   . Irritable bowel syndrome   . Joint pain   . Lactose intolerance   . Migraine    hasnt had one in a long time  . Obesity, unspecified   . Vitamin D deficiency    Past Surgical History:  Procedure Laterality Date  . CHOLECYSTECTOMY  05/06/11  . COLONOSCOPY  11/2001   polyp; negative pathology  . COLONOSCOPY  09/2004   Negative  . COLONOSCOPY  2017 or 2018 patient  unsure   [facility] ;   . COLONOSCOPY W/ POLYPECTOMY    . CYSTOSCOPY N/A 01/03/2018   Procedure: CYSTOSCOPY;  Surgeon: Bobbye Charleston, MD;  Location: [facility];  Service: Gynecology;  Laterality: N/A;  .  ESOPHAGOGASTRODUODENOSCOPY  1/10  normal  . FLEXIBLE SIGMOIDOSCOPY  03/05/2012   Procedure: FLEXIBLE SIGMOIDOSCOPY;  Surgeon: Inda Castle, MD;  Location: [facility];  Service: Endoscopy;  Laterality: N/A;  . LAPAROSCOPIC DECORTICATION / DUBULKING / ABLATION RENAL CYSTS     saline histogram  . ROBOTIC ASSISTED LAPAROSCOPIC HYSTERECTOMY AND SALPINGECTOMY Bilateral 01/03/2018   Procedure: XI ROBOTIC ASSISTED LAPAROSCOPIC HYSTERECTOMY AND SALPINGECTOMY;  Surgeon: Bobbye Charleston, MD;  Location: [facility];  Service: Gynecology;  Laterality: Bilateral;  WITH BED AFTER  . TONSILLECTOMY  07/30/07  . Uterine US  09/2005   Large endometrial stripe; small ovarian cyst   Social History   Tobacco Use  . Smoking status: Never Smoker  . Smokeless tobacco: Never Used  Substance Use Topics  . Alcohol use: No    Alcohol/week: 0.0 standard drinks  . Drug use: No   Family History  Problem Relation Age of Onset  . Heart disease Father   . Hypertension Father   . Heart attack Father   . Diabetes Father   . Sleep apnea Mother   . Obesity Mother   . Ulcers Brother   . Clotting disorder Brother   . Ovarian cancer Maternal Grandmother   . Colon polyps Maternal Grandmother   . Colon cancer Maternal Grandmother        40's  . Diabetes Paternal Grandmother   . Hypertension Paternal Grandmother   . Breast cancer Maternal Aunt        Age 8's  . Diabetes Paternal Aunt        x 5  . Lung cancer Maternal Grandfather        SMOKER   Allergies  Allergen Reactions  . Prevacid [Lansoprazole] Rash   Current Outpatient Medications on File Prior to Visit  Medication Sig Dispense Refill  . ibuprofen (ADVIL,MOTRIN) 600 MG tablet Take 1 tablet (600 mg total) by mouth every 8 (eight) hours as needed for fever, headache, mild pain or moderate pain. With food 45 tablet 1  . polyethylene glycol powder (GLYCOLAX/MIRALAX) 17 GM/SCOOP powder Take 17 g by mouth daily. 3350 g 0  . Vitamin D, Ergocalciferol,  (DRISDOL) 1.25 MG (50000 UNIT) CAPS capsule Take 1 capsule (50,000 Units total) by mouth every 7 (seven) days. 4 capsule 0  . hydrocortisone (ANUSOL-HC) 25 MG suppository Place 1 suppository (25 mg total) rectally 2 (two) times daily as needed for hemorrhoids or anal itching. (Patient not taking: Reported on 09/24/2019) 12 suppository 1   No current facility-administered medications on file prior to visit.    Review of Systems  Constitutional: Negative for activity change, appetite change, fatigue, fever and unexpected weight change.  HENT: Negative for congestion, ear pain, rhinorrhea, sinus pressure and sore throat.   Eyes: Negative for pain, redness and visual disturbance.  Respiratory: Negative for cough, shortness of breath and wheezing.   Cardiovascular: Negative for chest pain and palpitations.  Gastrointestinal: Negative for abdominal pain, blood in stool, constipation and diarrhea.  Endocrine: Negative for polydipsia and polyuria.  Genitourinary: Negative for dysuria, frequency and urgency.  Musculoskeletal: Negative for arthralgias, back pain and myalgias.  Skin: Negative for pallor and rash.  Allergic/Immunologic: Negative for environmental allergies.  Neurological: Negative for dizziness, syncope and headaches.  Hematological: Negative for adenopathy. Does not bruise/bleed easily.  Psychiatric/Behavioral: Negative for decreased concentration and dysphoric mood. The patient is not nervous/anxious.        Objective:   Physical Exam Constitutional:      General: She is not in acute distress.  Appearance: Normal appearance. She is well-developed. She is obese. She is not ill-appearing or diaphoretic.  HENT:     Head: Normocephalic and atraumatic.     Right Ear: Tympanic membrane, ear canal and external ear normal.     Left Ear: Tympanic membrane, ear canal and external ear normal.     Nose: Nose normal. No congestion.     Mouth/Throat:     Mouth: Mucous membranes are moist.      Pharynx: Oropharynx is clear. No posterior oropharyngeal erythema.  Eyes:     General: No scleral icterus.    Extraocular Movements: Extraocular movements intact.     Conjunctiva/sclera: Conjunctivae normal.     Pupils: Pupils are equal, round, and reactive to light.  Neck:     Thyroid: No thyromegaly.     Vascular: No carotid bruit or JVD.  Cardiovascular:     Rate and Rhythm: Normal rate and regular rhythm.     Pulses: Normal pulses.     Heart sounds: Normal heart sounds. No gallop.   Pulmonary:     Effort: Pulmonary effort is normal. No respiratory distress.     Breath sounds: Normal breath sounds. No wheezing.     Comments: Good air exch Chest:     Chest wall: No tenderness.  Abdominal:     General: Bowel sounds are normal. There is no distension or abdominal bruit.     Palpations: Abdomen is soft. There is no mass.     Tenderness: There is no abdominal tenderness.     Hernia: No hernia is present.  Genitourinary:    Comments: Breast exam: No mass, nodules, thickening, tenderness, bulging, retraction, inflamation, nipple discharge or skin changes noted.  No axillary or clavicular LA.     Musculoskeletal:        General: No tenderness. Normal range of motion.     Cervical back: Normal range of motion and neck supple. No rigidity. No muscular tenderness.     Right lower leg: No edema.     Left lower leg: No edema.     Comments: No kyphosis   Lymphadenopathy:     Cervical: No cervical adenopathy.  Skin:    General: Skin is warm and dry.     Coloration: Skin is not pale.     Findings: No erythema or rash.     Comments: Few skin tags    Neurological:     Mental Status: She is alert. Mental status is at baseline.     Cranial Nerves: No cranial nerve deficit.     Motor: No abnormal muscle tone.     Coordination: Coordination normal.     Gait: Gait normal.     Deep Tendon Reflexes: Reflexes are normal and symmetric. Reflexes normal.  Psychiatric:        Mood and  Affect: Mood normal.        Cognition and Memory: Cognition and memory normal.     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[place])    No re occurances        Other   Morbid obesity ([place])    Discussed how this problem influences overall health and the risks it imposes  Reviewed plan for weight loss with lower calorie diet (via better food choices and also portion control or program like  weight watchers) and exercise building up to or more than 30 minutes 5 days per week including some aerobic activity   Commended on wt loss so far with the healthy wt and wellness program       Routine general medical examination at a health care facility - Primary    Reviewed health habits including diet and exercise and skin cancer prevention Reviewed appropriate screening tests for age  Also reviewed health mt list, fam hx and immunization status , as well as social and family history   See HPI Labs reviewed -stable Enc further wt loss with her program  Has had her mammogram  Plans on getting a covid vaccine when available         Vitamin D deficiency    Level of 41.9 with current high dose weekly tx from the healthy weight clinic

## 2019-10-03 ENCOUNTER — Telehealth (INDEPENDENT_AMBULATORY_CARE_PROVIDER_SITE_OTHER): Payer: BC Managed Care – PPO | Admitting: Bariatrics

## 2019-10-10 ENCOUNTER — Other Ambulatory Visit: Payer: Self-pay

## 2019-10-10 ENCOUNTER — Encounter (INDEPENDENT_AMBULATORY_CARE_PROVIDER_SITE_OTHER): Payer: Self-pay | Admitting: Bariatrics

## 2019-10-10 ENCOUNTER — Telehealth (INDEPENDENT_AMBULATORY_CARE_PROVIDER_SITE_OTHER): Payer: BC Managed Care – PPO | Admitting: Bariatrics

## 2019-10-10 DIAGNOSIS — E559 Vitamin D deficiency, unspecified: Secondary | ICD-10-CM

## 2019-10-10 DIAGNOSIS — Z6841 Body Mass Index (BMI) 40.0 and over, adult: Secondary | ICD-10-CM

## 2019-10-10 DIAGNOSIS — M1990 Unspecified osteoarthritis, unspecified site: Secondary | ICD-10-CM | POA: Diagnosis not present

## 2019-10-10 MED ORDER — VITAMIN D (ERGOCALCIFEROL) 1.25 MG (50000 UNIT) PO CAPS: 50000.0000 [IU] | ORAL_CAPSULE | ORAL | 0 refills | Status: DC

## 2019-10-10 NOTE — Progress Notes (Signed)
TeleHealth Visit:  Due to the COVID-19 pandemic, this visit was completed with telemedicine (audio/video) technology to reduce patient and provider exposure as well as to preserve personal protective equipment.   Brittney [name] has verbally consented to this TeleHealth visit. The patient is located at home, the provider is located at the [facility] office. The participants in this visit include the listed provider and patient. The visit was conducted today via Webex.  Chief Complaint: OBESITY Brittney [name] is here to discuss her progress with her obesity treatment plan along with follow-up of her obesity related diagnoses. Brittney [name] is on keeping a food journal and adhering to recommended goals of 1200-1500 calories and 100 grams of protein and intermittent fasting and states she is following her eating plan approximately 50-60% of the time. Brittney [name] states she is walking 3 miles/gym or weights 30-40 minutes 3 times per week.  Today's visit was #: 12 Starting weight: 266 lbs Starting date: 03/07/2019  Interim History: Brittney [name] states that she has lost weight (weight approximately 248).  Subjective:   Vitamin D deficiency. No nausea, vomiting, or muscle weakness. Last Vitamin D 41.92 on 09/17/2019.  Osteoarthritis, unspecified osteoarthritis type, unspecified site. Brittney [name] reports having occasional pain in her lower back. She does report having increased energy.  Assessment/Plan:   Vitamin D deficiency. Low Vitamin D level contributes to fatigue and are associated with obesity, breast, and colon cancer. She was given a prescription for Vitamin D, Ergocalciferol, (DRISDOL) 1.25 MG (50000 UNIT) CAPS capsule every week #4 with 0 refills and will follow-up for routine testing of Vitamin D, at least 2-3 times per year to avoid over-replacement.     Osteoarthritis, unspecified osteoarthritis type, unspecified site. Azura will avoid pounding exercises and will continue to stretch.  Class 3 severe  obesity with serious comorbidity and body mass index (BMI) of 40.0 to 44.9 in adult, unspecified obesity type ([place]).  Brittney [name] is currently in the action stage of change. As such, her goal is to continue with weight loss efforts. She has agreed to keeping a food journal and adhering to recommended goals of 1200-1500 calories and 100+ grams of protein.   She will work on meal planning and mindful eating.  Exercise goals: [name] will continue to walk 3 miles a day and lift weights.  Behavioral modification strategies: increasing lean protein intake, decreasing simple carbohydrates, increasing vegetables, increasing water intake, decreasing eating out, no skipping meals, meal planning and cooking strategies, keeping healthy foods in the home and planning for success.  Brittney [name] has agreed to follow-up with our clinic in 2 weeks. She was informed of the importance of frequent follow-up visits to maximize her success with intensive lifestyle modifications for her multiple health conditions.  Objective:   VITALS: Per patient if applicable, see vitals. GENERAL: Alert and in no acute distress. CARDIOPULMONARY: No increased WOB. Speaking in clear sentences.  PSYCH: Pleasant and cooperative. Speech normal rate and rhythm. Affect is appropriate. Insight and judgement are appropriate. Attention is focused, linear, and appropriate.  NEURO: Oriented as arrived to appointment on time with no prompting.   Lab Results  Component Value Date   CREATININE 1.00 09/17/2019   BUN 9 09/17/2019   NA 138 09/17/2019   K 4.0 09/17/2019   CL 106 09/17/2019   CO2 25 09/17/2019   Lab Results  Component Value Date   ALT 14 09/17/2019   AST 13 09/17/2019   ALKPHOS 54 09/17/2019   BILITOT 0.5 09/17/2019   Lab Results  Component Value Date   HGBA1C 4.4 02/21/2019   HGBA1C 5.8 02/21/2019   Lab Results  Component Value Date   INSULIN 10.8 03/07/2019   Lab Results  Component Value Date   TSH 0.81 09/17/2019   Lab  Results  Component Value Date   CHOL 168 09/17/2019   HDL 40.50 09/17/2019   LDLCALC 118 (H) 09/17/2019   TRIG 47.0 09/17/2019   CHOLHDL 4 09/17/2019   Lab Results  Component Value Date   WBC 8.0 09/17/2019   HGB 13.1 09/17/2019   HCT 40.0 09/17/2019   MCV 83.8 09/17/2019   PLT 261.0 09/17/2019   Lab Results  Component Value Date   IRON 152 (H) 09/20/2016   FERRITIN 6.7 (L) 09/20/2016   Attestation Statements:   Reviewed by clinician on day of visit: allergies, medications, problem list, medical history, surgical history, family history, social history, and previous encounter notes.  Migdalia Dk, am acting as Location manager for [employer], DO   I have reviewed the above documentation for accuracy and completeness, and I agree with the above. Jearld Lesch, DO

## 2019-10-16 ENCOUNTER — Ambulatory Visit: Payer: BC Managed Care – PPO | Admitting: Family Medicine

## 2019-10-16 ENCOUNTER — Encounter: Payer: Self-pay | Admitting: Family Medicine

## 2019-10-16 ENCOUNTER — Other Ambulatory Visit: Payer: Self-pay

## 2019-10-16 VITALS — BP 118/66 | HR 55 | Temp 97.4°F | Ht 67.0 in | Wt 252.2 lb

## 2019-10-16 DIAGNOSIS — R339 Retention of urine, unspecified: Secondary | ICD-10-CM | POA: Diagnosis not present

## 2019-10-16 DIAGNOSIS — R631 Polydipsia: Secondary | ICD-10-CM

## 2019-10-16 DIAGNOSIS — R638 Other symptoms and signs concerning food and fluid intake: Secondary | ICD-10-CM | POA: Diagnosis not present

## 2019-10-16 DIAGNOSIS — E8889 Other specified metabolic disorders: Secondary | ICD-10-CM | POA: Diagnosis not present

## 2019-10-16 DIAGNOSIS — IMO0001 Reserved for inherently not codable concepts without codable children: Secondary | ICD-10-CM | POA: Insufficient documentation

## 2019-10-16 LAB — POC URINALSYSI DIPSTICK (AUTOMATED)
Blood, UA: NEGATIVE
Glucose, UA: NEGATIVE
Ketones, UA: 15
Leukocytes, UA: NEGATIVE
Nitrite, UA: NEGATIVE
Protein, UA: NEGATIVE
Spec Grav, UA: >=1.03 — AB (ref 1.010–1.025)
Urobilinogen, UA: 0.2 E.U./dL
pH, UA: 6 (ref 5.0–8.0)

## 2019-10-16 LAB — GLUCOSE, POCT (MANUAL RESULT ENTRY): POC Glucose: 88 mg/dl (ref 70–99)

## 2019-10-16 NOTE — Patient Instructions (Addendum)
Start measuring the amount you drink  You need more fluids   Your urine is concentrated -it indicates that you may be dehydrated  Perhaps the exercise is taking out more fluids than you think   You may have to change your intermittent fasting to shorter periods (like 16 hours)    Labs today- checking kidney function  No indication that you have diabetes  Doing great with weight loss   Let us know how you feel in about a weeks with more fluids

## 2019-10-16 NOTE — Progress Notes (Signed)
Subjective:    Patient ID: Brittney [name], female    DOB: [birth year], 39 y.o.   MRN: [id number]  HPI Pt presents with increased thirst and urinary changes   Wt Readings from Last 3 Encounters:  10/16/19 252 lb 3 oz (114.4 kg)  09/24/19 260 lb 4 oz (118 kg)  07/17/19 263 lb 5 oz (119.4 kg)   39.50 kg/m   She drinks a lot of water every day  Even after she drinks she is still thirsty  Thinks she drinks about a gallon per day   Working out a lot  1 1/2 hours of exercise per day   ua today Results for orders placed or performed in visit on 10/16/19  POCT Urinalysis Dipstick (Automated)  Result Value Ref Range   Color, UA Dark Yellow    Clarity, UA Clear    Glucose, UA Negative Negative   Bilirubin, UA Trace    Ketones, UA 15 mg/dL    Spec Grav, UA >=1.030 (A) 1.010 - 1.025   Blood, UA Negative    pH, UA 6.0 5.0 - 8.0   Protein, UA Negative Negative   Urobilinogen, UA 0.2 0.2 or 1.0 E.U./dL   Nitrite, UA Negative    Leukocytes, UA Negative Negative  Glucose (CBG)  Result Value Ref Range   POC Glucose 88 70 - 99 mg/dl    Concentrated urine  Ketones - in urine (she is doing a 48 h fast) - will start eating at 7 pm  She is practicing intermittent fasting and it works very well for her   Does drink smart water with electrolytes  Did get dizzy when she worked out yesterday    Her healthy weight clinic knows she is doing the intermittent fasting  Fasts longer if she had too many carbs     BP Readings from Last 3 Encounters:  10/16/19 118/66  09/24/19 108/66  07/17/19 128/78   Pulse Readings from Last 3 Encounters:  10/16/19 (!) 55  09/24/19 (!) 57  07/17/19 61    Lab Results  Component Value Date   CREATININE 1.00 09/17/2019   BUN 9 09/17/2019   NA 138 09/17/2019   K 4.0 09/17/2019   CL 106 09/17/2019   CO2 25 09/17/2019   Glucose on 2/2 was nl at 80  Patient Active Problem List   Diagnosis Date Noted  . 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 Thirst  10/16/2019  . Vitamin D deficiency 09/16/2019  . Mild reactive airways disease 03/20/2019  . Hot flashes 02/20/2019  . Constipation 02/20/2019  . Fatigue 02/20/2019  . Right knee pain 10/17/2018  . Benign paroxysmal positional vertigo 05/16/2018  . Postoperative state 01/03/2018  . Lipoma of back 10/04/2017  . Routine general medical examination at a health care facility 08/29/2017  . Fibrocystic breast changes 01/08/2016  . Hemorrhoids, external 09/30/2014  . Chronic diarrhea 08/29/2014  . 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 Hemorrhoids, internal, with bleeding 04/22/2014  . Family history of pulmonary embolism 09/02/2013  . Family history of diabetes mellitus 09/02/2013  . Cutaneous skin tags 05/15/2013  . 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 Morbid obesity ([place]) 03/31/2008  . HEARING LOSS, MILD 03/31/2008  . MIGRAINE HEADACHE 02/20/2007  . IBS 02/20/2007  . OSTEOARTHRITIS 02/20/2007   Past Medical History:  Diagnosis Date  . Anemia   . B12 deficiency   . Back pain   . Colon polyps   . Coronary artery abnormality    spasms   .  Coronary artery spasm [facility])    reports saw cardiologist for chest pains , was told she was having coronary artery spasms , sent for cardio w/u with stress and echo  both unremarkable. reports today has not had any spasms or chest pains in over 2 years    . Fibroids   . Gallstones   . Irritable bowel syndrome   . Joint pain   . Lactose intolerance   . Migraine    hasnt had one in a long time  . Obesity, unspecified   . Vitamin D deficiency    Past Surgical History:  Procedure Laterality Date  . CHOLECYSTECTOMY  05/06/11  . COLONOSCOPY  11/2001   polyp; negative pathology  . COLONOSCOPY  09/2004   Negative  . COLONOSCOPY  2017 or 2018 patient  unsure   [facility] ;   . COLONOSCOPY W/ POLYPECTOMY    . CYSTOSCOPY N/A 01/03/2018   Procedure: CYSTOSCOPY;  Surgeon: Bobbye Charleston, MD;  Location: [facility];  Service: Gynecology;   Laterality: N/A;  . ESOPHAGOGASTRODUODENOSCOPY  1/10   normal  . FLEXIBLE SIGMOIDOSCOPY  03/05/2012   Procedure: FLEXIBLE SIGMOIDOSCOPY;  Surgeon: Inda Castle, MD;  Location: [facility];  Service: Endoscopy;  Laterality: N/A;  . LAPAROSCOPIC DECORTICATION / DUBULKING / ABLATION RENAL CYSTS     saline histogram  . ROBOTIC ASSISTED LAPAROSCOPIC HYSTERECTOMY AND SALPINGECTOMY Bilateral 01/03/2018   Procedure: XI ROBOTIC ASSISTED LAPAROSCOPIC HYSTERECTOMY AND SALPINGECTOMY;  Surgeon: Bobbye Charleston, MD;  Location: [facility];  Service: Gynecology;  Laterality: Bilateral;  WITH BED AFTER  . TONSILLECTOMY  07/30/07  . Uterine US  09/2005   Large endometrial stripe; small ovarian cyst   Social History   Tobacco Use  . Smoking status: Never Smoker  . Smokeless tobacco: Never Used  Substance Use Topics  . Alcohol use: No    Alcohol/week: 0.0 standard drinks  . Drug use: No   Family History  Problem Relation Age of Onset  . Heart disease Father   . Hypertension Father   . Heart attack Father   . Diabetes Father   . Sleep apnea Mother   . Obesity Mother   . Ulcers Brother   . Clotting disorder Brother   . Ovarian cancer Maternal Grandmother   . Colon polyps Maternal Grandmother   . Colon cancer Maternal Grandmother        40's  . Diabetes Paternal Grandmother   . Hypertension Paternal Grandmother   . Breast cancer Maternal Aunt        Age 73's  . Diabetes Paternal Aunt        x 5  . Lung cancer Maternal Grandfather        SMOKER   Allergies  Allergen Reactions  . Prevacid [Lansoprazole] Rash   Current Outpatient Medications on File Prior to Visit  Medication Sig Dispense Refill  . fluticasone (FLONASE) 50 MCG/ACT nasal spray Place 2 sprays into both nostrils daily as needed (for allergies.). 48 g 3  . hydrocortisone (ANUSOL-HC) 25 MG suppository Place 1 suppository (25 mg total) rectally 2 (two) times daily as needed for hemorrhoids or anal itching. 12 suppository 1  .  ibuprofen (ADVIL,MOTRIN) 600 MG tablet Take 1 tablet (600 mg total) by mouth every 8 (eight) hours as needed for fever, headache, mild pain or moderate pain. With food 45 tablet 1  . levocetirizine (XYZAL) 5 MG tablet Take 1 tablet (5 mg total) by mouth every evening. 90 tablet 3  . montelukast (SINGULAIR)  10 MG tablet Take 1 tablet (10 mg total) by mouth at bedtime. 90 tablet 3  . polyethylene glycol powder (GLYCOLAX/MIRALAX) 17 GM/SCOOP powder Take 17 g by mouth daily. 3350 g 0  . triamcinolone cream (KENALOG) 0.1 % Apply 1 application topically 2 (two) times daily as needed (for Eczema). 30 g 3  . Vitamin D, Ergocalciferol, (DRISDOL) 1.25 MG (50000 UNIT) CAPS capsule Take 1 capsule (50,000 Units total) by mouth every 7 (seven) days. 4 capsule 0   No current facility-administered medications on file prior to visit.    Review of Systems  Constitutional: Positive for appetite change. Negative for activity change, fatigue, fever and unexpected weight change.  HENT: Negative for congestion, ear pain, rhinorrhea, sinus pressure and sore throat.   Eyes: Negative for pain, redness and visual disturbance.  Respiratory: Negative for cough, shortness of breath and wheezing.   Cardiovascular: Negative for chest pain and palpitations.  Gastrointestinal: Negative for abdominal pain, blood in stool, constipation and diarrhea.  Endocrine: Positive for polydipsia. Negative for polyphagia and polyuria.  Genitourinary: Negative for dysuria, flank pain, frequency, hematuria and urgency.  Musculoskeletal: Negative for arthralgias, back pain and myalgias.  Skin: Negative for pallor and rash.  Allergic/Immunologic: Negative for environmental allergies.  Neurological: Negative for dizziness, syncope and headaches.  Hematological: Negative for adenopathy. Does not bruise/bleed easily.  Psychiatric/Behavioral: Negative for decreased concentration and dysphoric mood. The patient is not nervous/anxious.          Objective:   Physical Exam Constitutional:      General: She is not in acute distress.    Appearance: Normal appearance. She is well-developed. She is obese. She is not ill-appearing or diaphoretic.  HENT:     Head: Normocephalic and atraumatic.     Mouth/Throat:     Mouth: Mucous membranes are moist.  Eyes:     General: No scleral icterus.    Conjunctiva/sclera: Conjunctivae normal.     Pupils: Pupils are equal, round, and reactive to light.  Neck:     Thyroid: No thyromegaly.     Vascular: No carotid bruit or JVD.  Cardiovascular:     Rate and Rhythm: Regular rhythm. Bradycardia present.     Heart sounds: Normal heart sounds. No gallop.   Pulmonary:     Effort: Pulmonary effort is normal. No respiratory distress.     Breath sounds: Normal breath sounds. No wheezing or rales.  Abdominal:     General: Bowel sounds are normal. There is no distension or abdominal bruit.     Palpations: Abdomen is soft. There is no mass.     Tenderness: There is no abdominal tenderness. There is no guarding or rebound.     Hernia: No hernia is present.  Musculoskeletal:     Cervical back: Normal range of motion and neck supple.  Lymphadenopathy:     Cervical: No cervical adenopathy.  Skin:    General: Skin is warm and dry.     Coloration: Skin is not pale.     Findings: No erythema or rash.  Neurological:     Mental Status: She is alert.     Cranial Nerves: No cranial nerve deficit.     Coordination: Coordination normal.     Deep Tendon Reflexes: Reflexes are normal and symmetric. Reflexes normal.  Psychiatric:        Mood and Affect: Mood normal.        Cognition and Memory: Cognition and memory normal.  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 If that plays a role in thirst In ketosis as would be expected  Renal panel today  inst pt that shorter fasts may be safer (at times she  does not eat for 48 hours)      Relevant Orders   Renal function panel   Thirst - Primary    Per pt constant thirst despite drinking almost a gallon of water per day  Also does not urinate much She exercises a lot and does sweat Also practices intermittent fasting  Urine is concentrated - also with ketones  Glucose is nl and none in urine Disc possibility that she may not be getting enough fluids to keep up with her exercise needs  Renal panel drawn today  Pt will inc water intake and update [place] in the next several weeks        Other Visit Diagnoses    Excessive thirst       Relevant Orders   Glucose (CBG) (Completed)   Renal function panel   Urinary retention       Relevant Orders   POCT Urinalysis Dipstick (Automated) (Completed)

## 2019-10-16 NOTE — Assessment & Plan Note (Signed)
Per pt constant thirst despite drinking almost a gallon of water per day  Also does not urinate much She exercises a lot and does sweat Also practices intermittent fasting  Urine is concentrated - also with ketones  Glucose is nl and none in urine Disc possibility that she may not be getting enough fluids to keep up with her exercise needs  Renal panel drawn today  Pt will inc water intake and update [place] in the next several weeks

## 2019-10-16 NOTE — Assessment & Plan Note (Signed)
Pt practices intermittent fasting  ? If that plays a role in thirst In ketosis as would be expected  Renal panel today  inst pt that shorter fasts may be safer (at times she does not eat for 48 hours)

## 2019-10-17 LAB — RENAL FUNCTION PANEL
Albumin: 4.1 g/dL (ref 3.5–5.2)
BUN: 11 mg/dL (ref 6–23)
CO2: 25 mEq/L (ref 19–32)
Calcium: 9.5 mg/dL (ref 8.4–10.5)
Chloride: 105 mEq/L (ref 96–112)
Creatinine, Ser: 1.1 mg/dL (ref 0.40–1.20)
GFR: 66.37 mL/min (ref >60.00)
Glucose, Bld: 75 mg/dL (ref 70–99)
Phosphorus: 3.2 mg/dL (ref 2.3–4.6)
Potassium: 3.9 mEq/L (ref 3.5–5.1)
Sodium: 138 mEq/L (ref 135–145)

## 2019-10-28 ENCOUNTER — Ambulatory Visit: Payer: BC Managed Care – PPO | Attending: Internal Medicine

## 2019-10-28 DIAGNOSIS — Z20822 Contact with and (suspected) exposure to covid-19: Secondary | ICD-10-CM

## 2019-10-29 ENCOUNTER — Other Ambulatory Visit: Payer: Self-pay

## 2019-10-29 ENCOUNTER — Telehealth (INDEPENDENT_AMBULATORY_CARE_PROVIDER_SITE_OTHER): Payer: BC Managed Care – PPO | Admitting: Bariatrics

## 2019-10-29 ENCOUNTER — Encounter (INDEPENDENT_AMBULATORY_CARE_PROVIDER_SITE_OTHER): Payer: Self-pay | Admitting: Bariatrics

## 2019-10-29 DIAGNOSIS — E559 Vitamin D deficiency, unspecified: Secondary | ICD-10-CM

## 2019-10-29 DIAGNOSIS — Z6841 Body Mass Index (BMI) 40.0 and over, adult: Secondary | ICD-10-CM | POA: Diagnosis not present

## 2019-10-29 DIAGNOSIS — M25561 Pain in right knee: Secondary | ICD-10-CM | POA: Diagnosis not present

## 2019-10-29 LAB — NOVEL CORONAVIRUS, NAA: SARS-CoV-2, NAA: NOT DETECTED

## 2019-10-29 MED ORDER — VITAMIN D (ERGOCALCIFEROL) 1.25 MG (50000 UNIT) PO CAPS: 50000.0000 [IU] | ORAL_CAPSULE | ORAL | 0 refills | Status: DC

## 2019-10-30 NOTE — Progress Notes (Signed)
TeleHealth Visit:  Due to the COVID-19 pandemic, this visit was completed with telemedicine (audio/video) technology to reduce patient and provider exposure as well as to preserve personal protective equipment.   Brittney [name] has verbally consented to this TeleHealth visit. The patient is located at a hotel, the provider is located at the [facility] office. The participants in this visit include the listed provider and patient. The visit was conducted today via Webex.  Chief Complaint: OBESITY Brittney [name] is here to discuss her progress with her obesity treatment plan along with follow-up of her obesity related diagnoses. Brittney [name] is keeping a food journal and adhering to recommended goals of 1200-1500 calories and 90 grams of protein and states she is following her eating plan approximately 90% of the time. Brittney [name] states she is walking/elliptical 60 minutes 3-4 times per week.  Today's visit was #: 13 Starting weight: 266 lbs Starting date: 03/07/2019  Interim History: Brittney [name]'s weight is either down or the same. She has been eating out more due to her husband being at home (quarantined) due to COVID-19.  Subjective:   Acute pain of right knee. Hend is taking Advil if needed. She is having no pain.  Vitamin D deficiency. No nausea, vomiting, or muscle weakness. Last Vitamin D 41.92 on 09/17/2019.  Assessment/Plan:   Acute pain of right knee. Izumi will avoid pounding exercises and will gradually increase exercise.  Vitamin D deficiency. Low Vitamin D level contributes to fatigue and are associated with obesity, breast, and colon cancer. She was given a prescription for Vitamin D, Ergocalciferol, (DRISDOL) 1.25 MG (50000 UNIT) CAPS capsule every week #4 with 0 refills and will follow-up for routine testing of Vitamin D, at least 2-3 times per year to avoid over-replacement.   Class 3 severe obesity with serious comorbidity and body mass index (BMI) of 40.0 to 44.9 in adult,  unspecified obesity type ([place]).  Brittney [name] is currently in the action stage of change. As such, her goal is to continue with weight loss efforts. She has agreed to keeping a food journal and adhering to recommended goals of 1200-1500 calories and 90+ grams of protein.   She will weigh herself at home weekly, work on meal planning, intentional eating, and increasing her fiber intake (discussed ideas).  Exercise goals: Brittney [name] will continue walking/elliptical 60 minutes 3-4 times per week.  Behavioral modification strategies: increasing lean protein intake, decreasing simple carbohydrates, increasing vegetables, increasing water intake, no skipping meals, meal planning and cooking strategies, keeping healthy foods in the home and planning for success.  Brittney [name] has agreed to follow-up with our clinic in 2 weeks. She was informed of the importance of frequent follow-up visits to maximize her success with intensive lifestyle modifications for her multiple health conditions.  Objective:   VITALS: Per patient if applicable, see vitals. GENERAL: Alert and in no acute distress. CARDIOPULMONARY: No increased WOB. Speaking in clear sentences.  PSYCH: Pleasant and cooperative. Speech normal rate and rhythm. Affect is appropriate. Insight and judgement are appropriate. Attention is focused, linear, and appropriate.  NEURO: Oriented as arrived to appointment on time with no prompting.   Lab Results  Component Value Date   CREATININE 1.10 10/16/2019   BUN 11 10/16/2019   NA 138 10/16/2019   K 3.9 10/16/2019   CL 105 10/16/2019   CO2 25 10/16/2019   Lab Results  Component Value Date   ALT 14 09/17/2019   AST 13 09/17/2019   ALKPHOS 54 09/17/2019   BILITOT  0.5 09/17/2019   Lab Results  Component Value Date   HGBA1C 4.4 02/21/2019   HGBA1C 5.8 02/21/2019   Lab Results  Component Value Date   INSULIN 10.8 03/07/2019   Lab Results  Component Value Date   TSH 0.81 09/17/2019   Lab Results  Component  Value Date   CHOL 168 09/17/2019   HDL 40.50 09/17/2019   LDLCALC 118 (H) 09/17/2019   TRIG 47.0 09/17/2019   CHOLHDL 4 09/17/2019   Lab Results  Component Value Date   WBC 8.0 09/17/2019   HGB 13.1 09/17/2019   HCT 40.0 09/17/2019   MCV 83.8 09/17/2019   PLT 261.0 09/17/2019   Lab Results  Component Value Date   IRON 152 (H) 09/20/2016   FERRITIN 6.7 (L) 09/20/2016   Attestation Statements:   Reviewed by clinician on day of visit: allergies, medications, problem list, medical history, surgical history, family history, social history, and previous encounter notes.  Migdalia Dk, am acting as Location manager for [employer], DO   I have reviewed the above documentation for accuracy and completeness, and I agree with the above. Jearld Lesch, DO

## 2019-11-13 ENCOUNTER — Ambulatory Visit (INDEPENDENT_AMBULATORY_CARE_PROVIDER_SITE_OTHER): Payer: BC Managed Care – PPO | Admitting: Physician Assistant

## 2019-11-13 ENCOUNTER — Encounter (INDEPENDENT_AMBULATORY_CARE_PROVIDER_SITE_OTHER): Payer: Self-pay

## 2019-12-11 ENCOUNTER — Encounter: Payer: Self-pay | Admitting: Family Medicine

## 2019-12-11 ENCOUNTER — Telehealth (INDEPENDENT_AMBULATORY_CARE_PROVIDER_SITE_OTHER): Payer: BC Managed Care – PPO | Admitting: Family Medicine

## 2019-12-11 VITALS — Temp 97.5°F

## 2019-12-11 DIAGNOSIS — J309 Allergic rhinitis, unspecified: Secondary | ICD-10-CM | POA: Insufficient documentation

## 2019-12-11 DIAGNOSIS — J4520 Mild intermittent asthma, uncomplicated: Secondary | ICD-10-CM | POA: Diagnosis not present

## 2019-12-11 DIAGNOSIS — J301 Allergic rhinitis due to pollen: Secondary | ICD-10-CM

## 2019-12-11 DIAGNOSIS — J0100 Acute maxillary sinusitis, unspecified: Secondary | ICD-10-CM

## 2019-12-11 MED ORDER — AMOXICILLIN-POT CLAVULANATE 875-125 MG PO TABS: 1.0000 | ORAL_TABLET | Freq: Two times a day (BID) | ORAL | 0 refills | Status: DC

## 2019-12-11 MED ORDER — AZELASTINE HCL 0.15 % NA SOLN: 2.0000 | Freq: Every day | NASAL | 3 refills | Status: DC | PRN

## 2019-12-11 MED ORDER — ALBUTEROL SULFATE HFA 108 (90 BASE) MCG/ACT IN AERS: 2.0000 | INHALATION_SPRAY | RESPIRATORY_TRACT | 3 refills | Status: DC | PRN

## 2019-12-11 NOTE — Patient Instructions (Signed)
I think the pollen is triggering your nasal allergies and reactive airways  A mask may help when outdoors  Also you may have an early sinus infection   Continue singulair and flonase You can try different antihistamines like zyrtec or allegra or claritin   I sent a new antihistamine nasal spray called astepro- try that once daily as needed and be careful of sedation   Nasal saline spray is ok any time to flush nose/sinus area   Take the augmentin as directed for a sinus infection  You can return to work Update if not starting to improve in a week or if worsening

## 2019-12-11 NOTE — Assessment & Plan Note (Signed)
Recommend mask when outside flonase and singulair-continue  Consider change otc antihistmine (zyrtec or allegra or claritin)  Add astepro ns once daily (caution of sedation)  Update if not starting to improve in a week or if worsening

## 2019-12-11 NOTE — Progress Notes (Signed)
Virtual Visit via Video Note  I connected with Brittney [name] on 12/11/19 at 11:45 AM EDT by a video enabled telemedicine application and verified that I am speaking with the correct person using two identifiers.  Location: Patient: work (outdoors at work, [employer]) Provider: office    I discussed the limitations of evaluation and management by telemedicine and the availability of in person appointments. The patient expressed understanding and agreed to proceed.  Parties involved in encounter  Patient:Brittney [name]   Provider:  Loura Pardon MD    History of Present Illness: Pt presents for nasal symptoms   Worst allergies in spring   Allergies are bad in the spring Over weekend-intermittent hoarse voice   Congestion and runny nose - clear mucous  Sneezing a lot   Some sinus pain - under eyes  Getting worse   Ear fullness as well   Cough - clear phlegm if any  Some wheezing -needs refill of inhaler   Throat is ok  No fever -feels ok  97 this am  One night 100.0- thought she may have had a sinus infection  No chills or aches or change in taste/smell   Had a rapid covid test this am that was negative  Is fully vaccinated as well   singulair 10mg  xyzal 5 mg flonase ns -twice   Needs a note that she has seasonal allergies to go to work    Patient Active Problem List   Diagnosis Date Noted  . Allergic rhinitis 12/11/2019  . 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 Thirst 10/16/2019  . Vitamin D deficiency 09/16/2019  . Mild reactive airways disease 03/20/2019  . Hot flashes 02/20/2019  . Constipation 02/20/2019  . Fatigue 02/20/2019  . Right knee pain 10/17/2018  . Benign paroxysmal positional vertigo 05/16/2018  . Postoperative state 01/03/2018  . Lipoma of back 10/04/2017  . Acute sinusitis 10/04/2017  . Routine general medical examination at a health care facility 08/29/2017  . Fibrocystic breast changes 01/08/2016  . Hemorrhoids, external 09/30/2014  .  Chronic diarrhea 08/29/2014  . 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 Hemorrhoids, internal, with bleeding 04/22/2014  . Family history of pulmonary embolism 09/02/2013  . Family history of diabetes mellitus 09/02/2013  . Cutaneous skin tags 05/15/2013  . 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 Morbid obesity ([place]) 03/31/2008  . HEARING LOSS, MILD 03/31/2008  . MIGRAINE HEADACHE 02/20/2007  . IBS 02/20/2007  . OSTEOARTHRITIS 02/20/2007   Past Medical History:  Diagnosis Date  . Anemia   . B12 deficiency   . Back pain   . Colon polyps   . Coronary artery abnormality    spasms   . Coronary artery spasm [facility])    reports saw cardiologist for chest pains , was told she was having coronary artery spasms , sent for cardio w/u with stress and echo  both unremarkable. reports today has not had any spasms or chest pains in over 2 years    . Fibroids   . Gallstones   . Irritable bowel syndrome   . Joint pain   . Lactose intolerance   . Migraine    hasnt had one in a long time  . Obesity, unspecified   . Vitamin D deficiency    Past Surgical History:  Procedure Laterality Date  . CHOLECYSTECTOMY  05/06/11  . COLONOSCOPY  11/2001   polyp; negative pathology  . COLONOSCOPY  09/2004   Negative  . COLONOSCOPY  2017 or 2018 patient  unsure  [facility] ;   . COLONOSCOPY W/ POLYPECTOMY    . CYSTOSCOPY N/A 01/03/2018   Procedure: CYSTOSCOPY;  Surgeon: Bobbye Charleston, MD;  [facility]: [facility];  Service: Gynecology;  Laterality: N/A;  . ESOPHAGOGASTRODUODENOSCOPY  1/10   normal  . FLEXIBLE SIGMOIDOSCOPY  03/05/2012   Procedure: FLEXIBLE SIGMOIDOSCOPY;  Surgeon: Inda Castle, MD;  [facility];  Service: Endoscopy;  Laterality: N/A;  . LAPAROSCOPIC DECORTICATION / DUBULKING / ABLATION RENAL CYSTS     saline histogram  . ROBOTIC ASSISTED LAPAROSCOPIC HYSTERECTOMY AND SALPINGECTOMY Bilateral 01/03/2018   Procedure: XI ROBOTIC ASSISTED LAPAROSCOPIC HYSTERECTOMY AND  SALPINGECTOMY;  Surgeon: Bobbye Charleston, MD;  Location: [facility];  Service: Gynecology;  Laterality: Bilateral;  WITH BED AFTER  . TONSILLECTOMY  07/30/07  . Uterine US  09/2005   Large endometrial stripe; small ovarian cyst   Social History   Tobacco Use  . Smoking status: Never Smoker  . Smokeless tobacco: Never Used  Substance Use Topics  . Alcohol use: No    Alcohol/week: 0.0 standard drinks  . Drug use: No   Family History  Problem Relation Age of Onset  . Heart disease Father   . Hypertension Father   . Heart attack Father   . Diabetes Father   . Sleep apnea Mother   . Obesity Mother   . Ulcers Brother   . Clotting disorder Brother   . Ovarian cancer Maternal Grandmother   . Colon polyps Maternal Grandmother   . Colon cancer Maternal Grandmother        40's  . Diabetes Paternal Grandmother   . Hypertension Paternal Grandmother   . Breast cancer Maternal Aunt        Age 38's  . Diabetes Paternal Aunt        x 5  . Lung cancer Maternal Grandfather        SMOKER   Allergies  Allergen Reactions  . Prevacid [Lansoprazole] Rash   Current Outpatient Medications on File Prior to Visit  Medication Sig Dispense Refill  . fluticasone (FLONASE) 50 MCG/ACT nasal spray Place 2 sprays into both nostrils daily as needed (for allergies.). 48 g 3  . hydrocortisone (ANUSOL-HC) 25 MG suppository Place 1 suppository (25 mg total) rectally 2 (two) times daily as needed for hemorrhoids or anal itching. 12 suppository 1  . ibuprofen (ADVIL,MOTRIN) 600 MG tablet Take 1 tablet (600 mg total) by mouth every 8 (eight) hours as needed for fever, headache, mild pain or moderate pain. With food 45 tablet 1  . levocetirizine (XYZAL) 5 MG tablet Take 1 tablet (5 mg total) by mouth every evening. 90 tablet 3  . montelukast (SINGULAIR) 10 MG tablet Take 1 tablet (10 mg total) by mouth at bedtime. 90 tablet 3  . polyethylene glycol powder (GLYCOLAX/MIRALAX) 17 GM/SCOOP powder Take 17 g by mouth  daily. 3350 g 0  . triamcinolone cream (KENALOG) 0.1 % Apply 1 application topically 2 (two) times daily as needed (for Eczema). 30 g 3  . Vitamin D, Ergocalciferol, (DRISDOL) 1.25 MG (50000 UNIT) CAPS capsule Take 1 capsule (50,000 Units total) by mouth every 7 (seven) days. 4 capsule 0   No current facility-administered medications on file prior to visit.    Review of Systems  Constitutional: Negative for chills, fever and malaise/fatigue.  HENT: Positive for congestion and sinus pain. Negative for ear discharge, ear pain and sore throat.        Ear fullness   Eyes: Negative for blurred vision, discharge  and redness.  Respiratory: Positive for cough, sputum production and wheezing. Negative for shortness of breath and stridor.   Cardiovascular: Negative for chest pain, palpitations and leg swelling.  Gastrointestinal: Negative for abdominal pain, diarrhea, nausea and vomiting.  Musculoskeletal: Negative for myalgias.  Skin: Negative for rash.  Neurological: Negative for dizziness and headaches.    Observations/Objective: Patient appears well, in no distress Weight is baseline  No facial swelling or asymmetry Voice is very slightly hoarse  No obvious tremor or mobility impairment Moving neck and UEs normally Able to hear the call well  No cough or shortness of breath during interview  Notes pain under eyes bilaterally (not swollen)  She does sniffle occasionally  Talkative and mentally sharp with no cognitive changes No skin changes on face or neck , no rash or pallor Affect is normal    Assessment and Plan: Problem List Items Addressed This Visit      Respiratory   Acute sinusitis    S/p weeks of allergy congestion  Now maxillary pain and elevated temp one night (neg covid testing and is immunized)  Sent in augmentin  Will work on nasal allergy control  Nasal saline prn      Relevant Medications   Azelastine HCl 0.15 % SOLN   amoxicillin-clavulanate (AUGMENTIN)  875-125 MG tablet   Mild reactive airways disease    Worse with seasonal pollen and sinus infection  Refilled albuterol mdi  She knows how to use  Update if not starting to improve in a week or if worsening  e      Allergic rhinitis - Primary    Recommend mask when outside flonase and singulair-continue  Consider change otc antihistmine (zyrtec or allegra or claritin)  Add astepro ns once daily (caution of sedation)  Update if not starting to improve in a week or if worsening            Follow Up Instructions: I think the pollen is triggering your nasal allergies and reactive airways  A mask may help when outdoors  Also you may have an early sinus infection   Continue singulair and flonase You can try different antihistamines like zyrtec or allegra or claritin   I sent a new antihistamine nasal spray called astepro- try that once daily as needed and be careful of sedation   Nasal saline spray is ok any time to flush nose/sinus area   Take the augmentin as directed for a sinus infection  You can return to work Update if not starting to improve in a week or if worsening    I discussed the assessment and treatment plan with the patient. The patient was provided an opportunity to ask questions and all were answered. The patient agreed with the plan and demonstrated an understanding of the instructions.   The patient was advised to call back or seek an in-person evaluation if the symptoms worsen or if the condition fails to improve as anticipated.     Loura Pardon, MD

## 2019-12-11 NOTE — Assessment & Plan Note (Signed)
S/p weeks of allergy congestion  Now maxillary pain and elevated temp one night (neg covid testing and is immunized)  Sent in augmentin  Will work on nasal allergy control  Nasal saline prn

## 2019-12-11 NOTE — Assessment & Plan Note (Signed)
Worse with seasonal pollen and sinus infection  Refilled albuterol mdi  She knows how to use  Update if not starting to improve in a week or if worsening  e

## 2019-12-21 ENCOUNTER — Telehealth: Payer: BC Managed Care – PPO | Admitting: Nurse Practitioner

## 2019-12-21 DIAGNOSIS — M545 Low back pain, unspecified: Secondary | ICD-10-CM

## 2019-12-21 DIAGNOSIS — N3000 Acute cystitis without hematuria: Secondary | ICD-10-CM

## 2019-12-21 NOTE — Progress Notes (Signed)
Based on what you shared with me it looks like you have urinary tract infection with back pain,that should be evaluated in a face to face office visit. Due to the associated  back pain, you need to have a urinalysis and urine culture for proper treatment.   NOTE: If you entered your credit card information for this eVisit, you will not be charged. You may see a "hold" on your card for the $35 but that hold will drop off and you will not have a charge processed.  If you are having a true medical emergency please call 911.     For an urgent face to face visit, [place] has four urgent care centers for your convenience:   . [facility]    [phone]                  Get Driving Directions  [id number] [place], [place] [address] . 10 am to 8 pm Monday-Friday . 12 pm to 8 pm Saturday-Sunday   . [facility] at [place]                  Get Driving Directions  [id number] [address], [place], [place] [address] . 8 am to 8 pm Monday-Friday . 9 am to 6 pm Saturday . 11 am to 6 pm Sunday   . [facility] at [place]                  Get Driving Directions   [address] [place], [place] [address] . 8 am to 8 pm Monday-Friday . 8 am to 4 pm Saturday-Sunday    . [facility] at [place]                    Get Driving Directions  [id number]  [address], [place], [place] [address]  . Monday-Friday, 12 PM to 6 PM    Your e-visit answers were reviewed by a board certified advanced clinical practitioner to complete your personal care plan.  Thank you for using e-Visits.

## 2019-12-26 ENCOUNTER — Encounter: Payer: Self-pay | Admitting: Family Medicine

## 2019-12-28 ENCOUNTER — Telehealth: Payer: Self-pay | Admitting: Family Medicine

## 2019-12-28 DIAGNOSIS — R41840 Attention and concentration deficit: Secondary | ICD-10-CM | POA: Insufficient documentation

## 2019-12-28 NOTE — Telephone Encounter (Signed)
Ref done  Routing to [facility] 

## 2019-12-28 NOTE — Assessment & Plan Note (Signed)
Struggling  Wishes eval for ADD

## 2020-02-28 ENCOUNTER — Ambulatory Visit: Payer: BC Managed Care – PPO | Admitting: Family Medicine

## 2020-02-28 ENCOUNTER — Other Ambulatory Visit: Payer: Self-pay

## 2020-03-04 ENCOUNTER — Other Ambulatory Visit: Payer: Self-pay | Admitting: Family Medicine

## 2020-03-20 ENCOUNTER — Ambulatory Visit (INDEPENDENT_AMBULATORY_CARE_PROVIDER_SITE_OTHER)
Admission: RE | Admit: 2020-03-20 | Discharge: 2020-03-20 | Disposition: A | Discharge status: Home / Self Care | Payer: BC Managed Care – PPO | Source: Ambulatory Visit | Attending: Family Medicine | Admitting: Family Medicine

## 2020-03-20 ENCOUNTER — Ambulatory Visit: Payer: BC Managed Care – PPO | Admitting: Family Medicine

## 2020-03-20 ENCOUNTER — Encounter: Payer: Self-pay | Admitting: Family Medicine

## 2020-03-20 ENCOUNTER — Other Ambulatory Visit: Payer: Self-pay

## 2020-03-20 DIAGNOSIS — M546 Pain in thoracic spine: Secondary | ICD-10-CM

## 2020-03-20 DIAGNOSIS — M549 Dorsalgia, unspecified: Secondary | ICD-10-CM | POA: Insufficient documentation

## 2020-03-20 NOTE — Progress Notes (Signed)
Subjective:    Patient ID: Brittney [name], female    DOB: 17-Dec-1980, 39 y.o.   MRN: [id number]  This visit occurred during the SARS-CoV-2 public health emergency.  Safety protocols were in place, including screening questions prior to the visit, additional usage of staff PPE, and extensive cleaning of exam room while observing appropriate contact time as indicated for disinfecting solutions.    HPI Pt presents with c/o back pain   Wt Readings from Last 3 Encounters:  03/20/20 258 lb (117 kg)  10/16/19 252 lb 3 oz (114.4 kg)  09/24/19 260 lb 4 oz (118 kg)   40.41 kg/m  Pt came because her chiropractor noted increased size of back on the right  ? Scoliosis   2 wk ago-woke up with a very stiff neck- she got an adjustment and it helped Then L sided mid back pain- adjustment also helped   No xrays done    Is R handed as well   Today back feels ok   Walks  Videos  Does cardio program  Weights  Is in a 75 day challenge for exercise  Feels good    Patient Active Problem List   Diagnosis Date Noted  . Back pain 03/20/2020  . Attention and concentration deficit 12/28/2019  . Allergic rhinitis 12/11/2019  . 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 Thirst 10/16/2019  . Vitamin D deficiency 09/16/2019  . Mild reactive airways disease 03/20/2019  . Hot flashes 02/20/2019  . Constipation 02/20/2019  . Fatigue 02/20/2019  . Right knee pain 10/17/2018  . Benign paroxysmal positional vertigo 05/16/2018  . Postoperative state 01/03/2018  . Lipoma of back 10/04/2017  . Acute sinusitis 10/04/2017  . Routine general medical examination at a health care facility 08/29/2017  . Fibrocystic breast changes 01/08/2016  . Hemorrhoids, external 09/30/2014  . Chronic diarrhea 08/29/2014  . 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 Hemorrhoids, internal, with bleeding 04/22/2014  . Family history of pulmonary embolism 09/02/2013  . Family history of diabetes mellitus 09/02/2013  .  Cutaneous skin tags 05/15/2013  . 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 Morbid obesity ([place]) 03/31/2008  . HEARING LOSS, MILD 03/31/2008  . MIGRAINE HEADACHE 02/20/2007  . IBS 02/20/2007  . OSTEOARTHRITIS 02/20/2007   Past Medical History:  Diagnosis Date  . Anemia   . B12 deficiency   . Back pain   . Colon polyps   . Coronary artery abnormality    spasms   . Coronary artery spasm [facility])    reports saw cardiologist for chest pains , was told she was having coronary artery spasms , sent for cardio w/u with stress and echo  both unremarkable. reports today has not had any spasms or chest pains in over 2 years    . Fibroids   . Gallstones   . Irritable bowel syndrome   . Joint pain   . Lactose intolerance   . Migraine    hasnt had one in a long time  . Obesity, unspecified   . Vitamin D deficiency    Past Surgical History:  Procedure Laterality Date  . CHOLECYSTECTOMY  05/06/11  . COLONOSCOPY  11/2001   polyp; negative pathology  . COLONOSCOPY  09/2004   Negative  . COLONOSCOPY  2017 or 2018 patient  unsure   [facility] ;   . COLONOSCOPY W/ POLYPECTOMY    . CYSTOSCOPY N/A 01/03/2018   Procedure: CYSTOSCOPY;  Surgeon: Bobbye Charleston, MD;  [facility];  Service: Gynecology;  Laterality:  N/A;  . ESOPHAGOGASTRODUODENOSCOPY  1/10   normal  . FLEXIBLE SIGMOIDOSCOPY  03/05/2012   Procedure: FLEXIBLE SIGMOIDOSCOPY;  Surgeon: Inda Castle, MD;  [facility];  Service: Endoscopy;  Laterality: N/A;  . LAPAROSCOPIC DECORTICATION / DUBULKING / ABLATION RENAL CYSTS     saline histogram  . ROBOTIC ASSISTED LAPAROSCOPIC HYSTERECTOMY AND SALPINGECTOMY Bilateral 01/03/2018   Procedure: XI ROBOTIC ASSISTED LAPAROSCOPIC HYSTERECTOMY AND SALPINGECTOMY;  Surgeon: Bobbye Charleston, MD;  [facility];  Service: Gynecology;  Laterality: Bilateral;  WITH BED AFTER  . TONSILLECTOMY  07/30/07  . Uterine US  09/2005   Large endometrial stripe; small ovarian cyst   Social  History   Tobacco Use  . Smoking status: Never Smoker  . Smokeless tobacco: Never Used  Substance Use Topics  . Alcohol use: No    Alcohol/week: 0.0 standard drinks  . Drug use: No   Family History  Problem Relation Age of Onset  . Heart disease Father   . Hypertension Father   . Heart attack Father   . Diabetes Father   . Sleep apnea Mother   . Obesity Mother   . Ulcers Brother   . Clotting disorder Brother   . Ovarian cancer Maternal Grandmother   . Colon polyps Maternal Grandmother   . Colon cancer Maternal Grandmother        40's  . Diabetes Paternal Grandmother   . Hypertension Paternal Grandmother   . Breast cancer Maternal Aunt        Age 76's  . Diabetes Paternal Aunt        x 5  . Lung cancer Maternal Grandfather        SMOKER   Allergies  Allergen Reactions  . Prevacid [Lansoprazole] Rash   Current Outpatient Medications on File Prior to Visit  Medication Sig Dispense Refill  . albuterol (VENTOLIN HFA) 108 (90 Base) MCG/ACT inhaler Inhale 2 puffs into the lungs every 4 (four) hours as needed for wheezing. 18 g 3  . Azelastine HCl 0.15 % SOLN PLACE 2 SPRAYS INTO THE NOSE DAILY AS NEEDED (ALLERGIES). IN EACH NOSTRIL 90 mL 1  . fluticasone (FLONASE) 50 MCG/ACT nasal spray Place 2 sprays into both nostrils daily as needed (for allergies.). 48 g 3  . hydrocortisone (ANUSOL-HC) 25 MG suppository Place 1 suppository (25 mg total) rectally 2 (two) times daily as needed for hemorrhoids or anal itching. 12 suppository 1  . ibuprofen (ADVIL,MOTRIN) 600 MG tablet Take 1 tablet (600 mg total) by mouth every 8 (eight) hours as needed for fever, headache, mild pain or moderate pain. With food 45 tablet 1  . levocetirizine (XYZAL) 5 MG tablet Take 1 tablet (5 mg total) by mouth every evening. 90 tablet 3  . montelukast (SINGULAIR) 10 MG tablet Take 1 tablet (10 mg total) by mouth at bedtime. 90 tablet 3  . polyethylene glycol powder (GLYCOLAX/MIRALAX) 17 GM/SCOOP powder Take  17 g by mouth daily. 3350 g 0  . triamcinolone cream (KENALOG) 0.1 % Apply 1 application topically 2 (two) times daily as needed (for Eczema). 30 g 3  . Vitamin D, Ergocalciferol, (DRISDOL) 1.25 MG (50000 UNIT) CAPS capsule Take 1 capsule (50,000 Units total) by mouth every 7 (seven) days. 4 capsule 0   No current facility-administered medications on file prior to visit.    Review of Systems  Constitutional: Negative for activity change, appetite change, fatigue, fever and unexpected weight change.  HENT: Negative for congestion, ear pain, rhinorrhea, sinus pressure and sore  throat.   Eyes: Negative for pain, redness and visual disturbance.  Respiratory: Negative for cough, shortness of breath and wheezing.   Cardiovascular: Negative for chest pain and palpitations.  Gastrointestinal: Negative for abdominal pain, blood in stool, constipation and diarrhea.  Endocrine: Negative for polydipsia and polyuria.  Genitourinary: Negative for dysuria, frequency and urgency.  Musculoskeletal: Positive for back pain. Negative for arthralgias and myalgias.       ? scoliosis  Skin: Negative for pallor and rash.  Allergic/Immunologic: Negative for environmental allergies.  Neurological: Negative for dizziness, syncope and headaches.  Hematological: Negative for adenopathy. Does not bruise/bleed easily.  Psychiatric/Behavioral: Negative for decreased concentration and dysphoric mood. The patient is not nervous/anxious.        Objective:   Physical Exam Constitutional:      General: She is not in acute distress.    Appearance: Normal appearance. She is obese.  Eyes:     Conjunctiva/sclera: Conjunctivae normal.     Pupils: Pupils are equal, round, and reactive to light.  Cardiovascular:     Rate and Rhythm: Regular rhythm.  Pulmonary:     Effort: Pulmonary effort is normal. No respiratory distress.     Breath sounds: Normal breath sounds. No wheezing.  Musculoskeletal:     Cervical back: Normal  range of motion. No tenderness.     Right lower leg: No edema.     Left lower leg: No edema.     Comments: No bony tenderness of CS/ TS or LS Muscular/scapula prominence on the R with suspected mild scoliosis  No skin changes  Very good rom of spine     Lymphadenopathy:     Cervical: No cervical adenopathy.  Skin:    General: Skin is warm and dry.     Coloration: Skin is not pale.     Findings: No erythema or rash.  Neurological:     Mental Status: She is alert.     Sensory: No sensory deficit.     Coordination: Coordination normal.     Deep Tendon Reflexes: Reflexes normal.  Psychiatric:        Mood and Affect: Mood normal.           Assessment & Plan:   Problem List Items Addressed This Visit      Other   Back pain    Intermittent thoracic back pain - (chiropractor treatment helps much)  Noted at a visit that her R scapula area appears larger  ? Scoliosis  On exam- nl rom /no pain , does have prominent musculature on the R with slt higher shoulder level  Of note-also R handed  Cannot completely r/o lipoma in that area as well  Will check TS film today and update Enc continued exercise/core work to help back health -she is doing well      Relevant Orders   DG Thoracic Spine W/Swimmers

## 2020-03-20 NOTE — Patient Instructions (Addendum)
Keep exercising and working on core strength   Let's check an xray to see if you have scoliosis   Heat is good for back pain and aches  Chiropractor is fine for back/neck problems

## 2020-03-20 NOTE — Assessment & Plan Note (Signed)
Intermittent thoracic back pain - (chiropractor treatment helps much)  Noted at a visit that her R scapula area appears larger  ? Scoliosis  On exam- nl rom /no pain , does have prominent musculature on the R with slt higher shoulder level  Of note-also R handed  Cannot completely r/o lipoma in that area as well  Will check TS film today and update Enc continued exercise/core work to help back health -she is doing well

## 2020-04-27 ENCOUNTER — Other Ambulatory Visit: Payer: Self-pay | Admitting: Family Medicine

## 2020-04-27 DIAGNOSIS — Z1231 Encounter for screening mammogram for malignant neoplasm of breast: Secondary | ICD-10-CM

## 2020-05-04 ENCOUNTER — Encounter: Payer: Self-pay | Admitting: Emergency Medicine

## 2020-05-04 ENCOUNTER — Ambulatory Visit (INDEPENDENT_AMBULATORY_CARE_PROVIDER_SITE_OTHER): Payer: BC Managed Care – PPO | Admitting: Psychology

## 2020-05-04 ENCOUNTER — Telehealth: Payer: Self-pay

## 2020-05-04 DIAGNOSIS — F909 Attention-deficit hyperactivity disorder, unspecified type: Secondary | ICD-10-CM

## 2020-05-04 DIAGNOSIS — Z5321 Procedure and treatment not carried out due to patient leaving prior to being seen by health care provider: Secondary | ICD-10-CM | POA: Insufficient documentation

## 2020-05-04 DIAGNOSIS — H538 Other visual disturbances: Secondary | ICD-10-CM | POA: Insufficient documentation

## 2020-05-04 NOTE — ED Triage Notes (Signed)
Pt sent by [facility] due to c/o blurred vision in left eye x1 week with clear discharge. Pt denies HA, DM or hypertension. Pt also denies weakness, facial droop and speech issues.

## 2020-05-04 NOTE — Telephone Encounter (Signed)
[facility] - Client TELEPHONE ADVICE RECORD [facility] Patient Name: Samhitha GILLIAM-WILSON Gender: Female DOB: 08/10/1981 Age: 39 Y 1 M 18 D Return Phone Number: [phone] (Secondary) [place] [address] Client  [facility] - Client Client Site [place] Glori Bickers, Roque Lias - MD Contact Type Call Who Is Calling Patient / Member / Family / Caregiver Call Type Triage / Clinical Relationship To Patient Self Return Phone Number [phone] (Secondary) Chief Complaint Vision - (non urgent symptoms) Reason for Call Symptomatic / Request for Health Information Initial Comment When she looks straight ahead she doesnt see clear, like a film over the eye, if she looks to the side, she sees clear Translation No Nurse Assessment Nurse: Loletha Carrow, RN, Ronalee Belts Date/Time (Eastern Time): 05/04/2020 1:57:38 PM Confirm and document reason for call. If symptomatic, describe symptoms. ---Caller States: When she looks straight ahead her vision is blurry and cloudy, problem is in my Left eye the right see's fine like a film over the eye, if she looks to the side, she sees clear . Denies any other symptoms Has the patient had close contact with a person known or suspected to have the novel coronavirus illness OR traveled / lives in area with major community spread (including international travel) in the last 14 days from the onset of symptoms? * If Asymptomatic, screen for exposure and travel within the last 14 days. ---No Does the patient have any new or worsening symptoms? ---Yes Will a triage be completed? ---Yes Related visit to physician within the last 2 weeks? ---No Does the PT have any chronic conditions? (i.e. diabetes, asthma, this includes High risk factors for pregnancy, etc.) ---No Is the patient pregnant or possibly pregnant? (Ask all females between the ages of  27-55) ---No Is this a behavioral health or substance abuse call? ---No Guidelines Guideline Title Affirmed Question Affirmed Notes Nurse Date/Time (Eastern Time) Vision Loss or Change [1] Blurred vision or visual changes AND [2] present now AND [3] sudden onset or new (e.g., minutes, hours, Emch, RN, Ronalee Belts 05/04/2020 2:00:14 PM PLEASE NOTE: All timestamps contained within this report are represented as [place] Standard Time. CONFIDENTIALTY NOTICE: This fax transmission is intended only for the addressee. It contains information that is legally privileged, confidential or otherwise protected from use or disclosure. If you are not the intended recipient, you are strictly prohibited from reviewing, disclosing, copying using or disseminating any of this information or taking any action in reliance on or regarding this information. If you have received this fax in error, please notify us immediately by telephone so that we can arrange for its return to [place]. Phone: [phone], Toll-Free: [phone], Fax: [phone] Page: 2 of 2 Call Id: [id number] Guidelines Guideline Title Affirmed Question Affirmed Notes Nurse Date/Time Eilene Ghazi Time) days) (Exception: seeing floaters / black specks OR previously diagnosed migraine headaches with same symptoms) Disp. Time Eilene Ghazi Time) Disposition Final User 05/04/2020 2:03:56 PM Go to ED Now (or PCP triage) Yes Emch, RN, Vicenta Dunning Disagree/Comply Comply Caller Understands Yes PreDisposition Did not know what to do Care Advice Given Per Guideline GO TO ED NOW (OR PCP TRIAGE): ANOTHER ADULT SHOULD DRIVE: * It is better and safer if another adult drives instead of you. CARE ADVICE given per Vision Loss or Change (Adult) guideline. Referrals GO TO FACILITY UNDECIDED

## 2020-05-04 NOTE — Telephone Encounter (Signed)
I was unable to reach pt; I did speak with pts husband and he said she went to an UC but is not sure which one. FYI to Dr Glori Bickers.

## 2020-05-04 NOTE — Telephone Encounter (Signed)
I will watch for correspondence

## 2020-05-05 ENCOUNTER — Telehealth: Payer: Self-pay

## 2020-05-05 ENCOUNTER — Emergency Department
Admission: EM | Admit: 2020-05-05 | Discharge: 2020-05-05 | Disposition: A | Discharge status: Home / Self Care | Payer: BC Managed Care – PPO | Attending: Emergency Medicine | Admitting: Emergency Medicine

## 2020-05-05 NOTE — Telephone Encounter (Signed)
Patient was seen at [facility] urgent care yesterday for blurry vision. She states they sent her to the ER for a CT scan. Patient states she waited all night in the ED, and still was not able to be seen. She is wondering if Dr. Glori Bickers can order a CT scan for her and it can be scheduled, so she does not have to wait in the ED? Dr. Glori Bickers, will you need to see her in the office first? Please advise.

## 2020-05-05 NOTE — Telephone Encounter (Signed)
Please send for the UC note when ready (I do not see in the chart)  Thanks  Then I can see what to do next Thanks for letting me know

## 2020-05-06 NOTE — Telephone Encounter (Signed)
ED did the referral to eye doc

## 2020-05-06 NOTE — Telephone Encounter (Signed)
I reviewed it -reassuring outcome with nl MRI   Does she need a referral for ophthalmology ?

## 2020-05-06 NOTE — Telephone Encounter (Signed)
Pt went back to ER and was evaluated. In Care Everywhere please review

## 2020-06-10 ENCOUNTER — Ambulatory Visit: Payer: BC Managed Care – PPO

## 2020-06-22 ENCOUNTER — Other Ambulatory Visit: Payer: Self-pay

## 2020-06-22 ENCOUNTER — Encounter: Payer: Self-pay | Admitting: Family Medicine

## 2020-06-22 ENCOUNTER — Ambulatory Visit: Payer: BC Managed Care – PPO | Admitting: Family Medicine

## 2020-06-22 VITALS — BP 100/70 | HR 64 | Temp 97.7°F | Ht 68.0 in | Wt 254.2 lb

## 2020-06-22 DIAGNOSIS — T2210XA Burn of first degree of shoulder and upper limb, except wrist and hand, unspecified site, initial encounter: Secondary | ICD-10-CM

## 2020-06-22 DIAGNOSIS — X131XXA Other contact with steam and other hot vapors, initial encounter: Secondary | ICD-10-CM | POA: Diagnosis not present

## 2020-06-22 NOTE — Progress Notes (Signed)
Mardell Suttles T. Ansh Fauble, MD, [place] at [facility] [place], [address]  Phone: [phone]   FAX: [place] - 39 y.o. female   MRN [id number]   Date of Birth: 1980/09/28  Date: 06/22/2020   PCP: Abner Greenspan, MD   Referral: Abner Greenspan, MD  Chief Complaint  Patient presents with   Burn    Right Forearm    This visit occurred during the SARS-CoV-2 public health emergency.  Safety protocols were in place, including screening questions prior to the visit, additional usage of staff PPE, and extensive cleaning of exam room while observing appropriate contact time as indicated for disinfecting solutions.   Subjective:   Brittney [name] is a 39 y.o. very pleasant female patient with Body mass index is 38.66 kg/m. who presents with the following:  She presents after having a right-sided arm burn that she got while cooking and steam hitting her forearm.  She has some pain and a burning sensation at the arm.  There is no vesicle formation.  There is some warmth associated with it.  Her school nurse recommended that she come in for a checkup about it.  She has full use of the hand, wrist, and elbow.  Review of Systems is noted in the HPI, as appropriate  Objective:   BP 100/70    Pulse 64    Temp 97.7 F (36.5 C) (Temporal)    Ht 5\' 8"  (1.727 m)    Wt 254 lb 4 oz (115.3 kg)    LMP 12/21/2017    BMI 38.66 kg/m   GEN: No acute distress; alert,appropriate. PULM: Breathing comfortably in no respiratory distress PSYCH: Normally interactive.   The right forearm at the volar aspect there is a darker coloration at the forearm.  There is no vesicular formation, elevation of the skin, and there is no surrounding skin change.  It is tender to palpation.  Laboratory and Imaging Data:  Assessment and Plan:     ICD-10-CM   1. Burn of arm, right, first degree, initial  encounter  T22.10XA    This will resolve on its own.  Cool compresses will help.  Leave open to air.  A mild amount of some lotion or cream applied may help with the sensations, but no antibiotic ointment as needed.  Follow-up as needed  Signed,  Frederico Hamman T. Kofi Murrell, MD   Outpatient Encounter Medications as of 06/22/2020  Medication Sig   albuterol (VENTOLIN HFA) 108 (90 Base) MCG/ACT inhaler Inhale 2 puffs into the lungs every 4 (four) hours as needed for wheezing.   Azelastine HCl 0.15 % SOLN PLACE 2 SPRAYS INTO THE NOSE DAILY AS NEEDED (ALLERGIES). IN EACH NOSTRIL   fluticasone (FLONASE) 50 MCG/ACT nasal spray Place 2 sprays into both nostrils daily as needed (for allergies.).   ibuprofen (ADVIL,MOTRIN) 600 MG tablet Take 1 tablet (600 mg total) by mouth every 8 (eight) hours as needed for fever, headache, mild pain or moderate pain. With food   levocetirizine (XYZAL) 5 MG tablet Take 1 tablet (5 mg total) by mouth every evening.   montelukast (SINGULAIR) 10 MG tablet Take 1 tablet (10 mg total) by mouth at bedtime.   triamcinolone cream (KENALOG) 0.1 % Apply 1 application topically 2 (two) times daily as needed (for Eczema).   hydrocortisone (ANUSOL-HC) 25 MG suppository Place 1 suppository (25 mg total) rectally 2 (two) times  daily as needed for hemorrhoids or anal itching. (Patient not taking: Reported on 06/22/2020)   polyethylene glycol powder (GLYCOLAX/MIRALAX) 17 GM/SCOOP powder Take 17 g by mouth daily. (Patient not taking: Reported on 06/22/2020)   Vitamin D, Ergocalciferol, (DRISDOL) 1.25 MG (50000 UNIT) CAPS capsule Take 1 capsule (50,000 Units total) by mouth every 7 (seven) days. (Patient not taking: Reported on 06/22/2020)   No facility-administered encounter medications on file as of 06/22/2020.

## 2020-06-23 ENCOUNTER — Encounter: Payer: Self-pay | Admitting: Family Medicine

## 2020-07-13 ENCOUNTER — Ambulatory Visit: Payer: BC Managed Care – PPO | Admitting: Psychology

## 2020-07-17 ENCOUNTER — Ambulatory Visit: Payer: BC Managed Care – PPO | Admitting: Psychology

## 2020-07-22 DIAGNOSIS — F4010 Social phobia, unspecified: Secondary | ICD-10-CM | POA: Diagnosis not present

## 2020-07-22 DIAGNOSIS — F900 Attention-deficit hyperactivity disorder, predominantly inattentive type: Secondary | ICD-10-CM | POA: Diagnosis not present

## 2020-07-28 ENCOUNTER — Telehealth: Payer: Self-pay | Admitting: Family Medicine

## 2020-07-28 NOTE — Telephone Encounter (Signed)
[place] sent me a message stating, Dr. Glori Bickers pt a note on pt's PPW saying:   Pt tested positive for ADHD, please schedule f/u to discuss and treat.  I left a detailed message on patient's voice mail to call back and schedule appointment.

## 2020-07-30 ENCOUNTER — Other Ambulatory Visit: Payer: Self-pay

## 2020-07-30 ENCOUNTER — Ambulatory Visit: Payer: BC Managed Care – PPO | Admitting: Family Medicine

## 2020-07-30 ENCOUNTER — Telehealth: Payer: Self-pay | Admitting: *Deleted

## 2020-07-30 ENCOUNTER — Encounter: Payer: Self-pay | Admitting: Family Medicine

## 2020-07-30 VITALS — BP 110/66 | HR 62 | Temp 97.0°F | Ht 67.0 in | Wt 253.5 lb

## 2020-07-30 DIAGNOSIS — F4010 Social phobia, unspecified: Secondary | ICD-10-CM | POA: Diagnosis not present

## 2020-07-30 DIAGNOSIS — F900 Attention-deficit hyperactivity disorder, predominantly inattentive type: Secondary | ICD-10-CM

## 2020-07-30 DIAGNOSIS — F988 Other specified behavioral and emotional disorders with onset usually occurring in childhood and adolescence: Secondary | ICD-10-CM | POA: Insufficient documentation

## 2020-07-30 MED ORDER — AMPHETAMINE-DEXTROAMPHETAMINE 20 MG PO TABS: ORAL_TABLET | ORAL | 0 refills | Status: DC

## 2020-07-30 NOTE — Patient Instructions (Signed)
Do make an appt. At your opt office for the left eye symptoms   I placed a counseling referral- you will get a call   Start the adderall 20 mg in am and mid day  Keep organized  Exercise/get outdoors and take care of yourself   If intolerable side effects -call and let us know  Before you need a refill - let me know how you are doing

## 2020-07-30 NOTE — Progress Notes (Signed)
Subjective:    Patient ID: Brittney [name], female    DOB: 08/30/1980, 39 y.o.   MRN: [id number]  This visit occurred during the SARS-CoV-2 public health emergency.  Safety protocols were in place, including screening questions prior to the visit, additional usage of staff PPE, and extensive cleaning of exam room while observing appropriate contact time as indicated for disinfecting solutions.    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 ADD (attention deficit disorder) 07/30/2020  . Social anxiety disorder 07/30/2020  . Back pain 03/20/2020  . Attention and concentration deficit 12/28/2019  . Allergic rhinitis 12/11/2019  . 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 Thirst 10/16/2019  . Vitamin D deficiency 09/16/2019  . Mild reactive airways disease 03/20/2019  . Hot flashes 02/20/2019  . Constipation 02/20/2019  . Fatigue 02/20/2019  . Right knee pain 10/17/2018  . Benign paroxysmal positional vertigo 05/16/2018  . Postoperative state 01/03/2018  . Lipoma of back 10/04/2017  . Acute sinusitis 10/04/2017  . Routine general medical examination at a health care facility 08/29/2017  . Fibrocystic  breast changes 01/08/2016  . Hemorrhoids, external 09/30/2014  . Chronic diarrhea 08/29/2014  . 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 Hemorrhoids, internal, with bleeding 04/22/2014  . Family history of pulmonary embolism 09/02/2013  . Family history of diabetes mellitus 09/02/2013  . Cutaneous skin tags 05/15/2013  . 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 Morbid obesity ([place]) 03/31/2008  . HEARING LOSS, MILD 03/31/2008  . MIGRAINE HEADACHE 02/20/2007  . IBS 02/20/2007  . OSTEOARTHRITIS 02/20/2007   Past Medical History:  Diagnosis Date  . Anemia   . B12 deficiency   . Back pain   . Colon polyps   . Coronary artery abnormality    spasms   . Coronary artery spasm [facility])    reports saw cardiologist for chest pains , was told she was having coronary artery spasms , sent for cardio w/u with stress and echo  both unremarkable. reports today has not had any spasms or chest pains in over 2 years    . Fibroids   . Gallstones   . Irritable bowel syndrome   . Joint pain   . Lactose intolerance   . Migraine    hasnt had one in a long time  . Obesity, unspecified   . Vitamin D deficiency    Past Surgical History:  Procedure Laterality Date  . CHOLECYSTECTOMY  05/06/11  . COLONOSCOPY  11/2001   polyp; negative pathology  . COLONOSCOPY  09/2004   Negative  . COLONOSCOPY  2017 or 2018 patient  unsure  [facility] ;   . COLONOSCOPY W/ POLYPECTOMY    . CYSTOSCOPY N/A 01/03/2018   Procedure: CYSTOSCOPY;  Surgeon: Bobbye Charleston, MD;  Location: [facility];  Service: Gynecology;  Laterality: N/A;  . ESOPHAGOGASTRODUODENOSCOPY  1/10   normal  . FLEXIBLE SIGMOIDOSCOPY  03/05/2012   Procedure: FLEXIBLE SIGMOIDOSCOPY;  Surgeon: Inda Castle, MD;  Location: [facility] ENDOSCOPY;  Service: Endoscopy;  Laterality: N/A;  . LAPAROSCOPIC DECORTICATION / DUBULKING / ABLATION RENAL CYSTS     saline histogram  . ROBOTIC ASSISTED LAPAROSCOPIC HYSTERECTOMY AND SALPINGECTOMY Bilateral 01/03/2018    Procedure: XI ROBOTIC ASSISTED LAPAROSCOPIC HYSTERECTOMY AND SALPINGECTOMY;  Surgeon: Bobbye Charleston, MD;  Location: [facility];  Service: Gynecology;  Laterality: Bilateral;  WITH BED AFTER  . TONSILLECTOMY  07/30/07  . Uterine US  09/2005   Large endometrial stripe; small ovarian cyst   Social History   Tobacco Use  . Smoking status: Never Smoker  . Smokeless tobacco: Never Used  Substance Use Topics  . Alcohol use: No    Alcohol/week: 0.0 standard drinks  . Drug use: No   Family History  Problem Relation Age of Onset  . Heart disease Father   . Hypertension Father   . Heart attack Father   . Diabetes Father   . Sleep apnea Mother   . Obesity Mother   . Ulcers Brother   . Clotting disorder Brother   . Ovarian cancer Maternal Grandmother   . Colon polyps Maternal Grandmother   . Colon cancer Maternal Grandmother        40's  . Diabetes Paternal Grandmother   . Hypertension Paternal Grandmother   . Breast cancer Maternal Aunt        Age 41's  . Diabetes Paternal Aunt        x 5  . Lung cancer Maternal Grandfather        SMOKER   Allergies  Allergen Reactions  . Prevacid [Lansoprazole] Rash   Current Outpatient Medications on File Prior to Visit  Medication Sig Dispense Refill  . albuterol (VENTOLIN HFA) 108 (90 Base) MCG/ACT inhaler Inhale 2 puffs into the lungs every 4 (four) hours as needed for wheezing. 18 g 3  . Azelastine HCl 0.15 % SOLN PLACE 2 SPRAYS INTO THE NOSE DAILY AS NEEDED (ALLERGIES). IN EACH NOSTRIL 90 mL 1  . fluticasone (FLONASE) 50 MCG/ACT nasal spray Place 2 sprays into both nostrils daily as needed (for allergies.). 48 g 3  . hydrocortisone (ANUSOL-HC) 25 MG suppository Place 1 suppository (25 mg total) rectally 2 (two) times daily as needed for hemorrhoids or anal itching. 12 suppository 1  . ibuprofen (ADVIL,MOTRIN) 600 MG tablet Take 1 tablet (600 mg total) by mouth every 8 (eight) hours as needed for fever, headache, mild pain or moderate pain.  With food 45 tablet 1  . levocetirizine (XYZAL) 5 MG tablet Take 1 tablet (5 mg total) by mouth every evening. 90 tablet 3  . montelukast (SINGULAIR) 10 MG tablet Take 1 tablet (10 mg total) by mouth at bedtime. 90 tablet 3  . polyethylene glycol powder (GLYCOLAX/MIRALAX) 17 GM/SCOOP powder Take 17 g by mouth daily. 3350 g 0  . triamcinolone cream (KENALOG) 0.1 % Apply 1 application topically 2 (two) times daily as needed (for Eczema). 30 g 3  . Vitamin D, Ergocalciferol, (DRISDOL) 1.25 MG (50000 UNIT) CAPS capsule Take 1 capsule (50,000 Units total) by mouth every 7 (seven) days. 4 capsule 0   No current facility-administered medications on file prior  to visit.     Review of Systems  Constitutional: Negative for activity change, appetite change, fatigue, fever and unexpected weight change.  HENT: Negative for congestion, ear pain, rhinorrhea, sinus pressure and sore throat.   Eyes: Positive for photophobia and visual disturbance. Negative for pain and redness.       L eye is more sensitive to light/trouble seeing at night  No pain or d/c  Respiratory: Negative for cough, shortness of breath and wheezing.   Cardiovascular: Negative for chest pain and palpitations.  Gastrointestinal: Negative for abdominal pain, blood in stool, constipation and diarrhea.  Endocrine: Negative for polydipsia and polyuria.  Genitourinary: Negative for dysuria, frequency and urgency.  Musculoskeletal: Negative for arthralgias, back pain and myalgias.  Skin: Negative for pallor and rash.  Allergic/Immunologic: Negative for environmental allergies.  Neurological: Negative for dizziness, syncope and headaches.  Hematological: Negative for adenopathy. Does not bruise/bleed easily.  Psychiatric/Behavioral: Positive for decreased concentration. Negative for confusion and dysphoric mood. The patient is nervous/anxious.        Objective:   Physical Exam Constitutional:      General: She is not in acute  distress.    Appearance: Normal appearance. She is well-developed and well-nourished. She is obese. She is not ill-appearing or diaphoretic.  HENT:     Head: Normocephalic and atraumatic.     Mouth/Throat:     Mouth: Oropharynx is clear and moist.  Eyes:     Extraocular Movements: EOM normal.     Conjunctiva/sclera: Conjunctivae normal.     Pupils: Pupils are equal, round, and reactive to light.  Neck:     Thyroid: No thyromegaly.     Vascular: No carotid bruit or JVD.  Cardiovascular:     Rate and Rhythm: Normal rate and regular rhythm.     Pulses: Intact distal pulses.     Heart sounds: Normal heart sounds. No gallop.   Pulmonary:     Effort: Pulmonary effort is normal. No respiratory distress.     Breath sounds: Normal breath sounds. No wheezing or rales.     Comments: No crackles Abdominal:     General: Bowel sounds are normal. There is no distension or abdominal bruit.     Palpations: Abdomen is soft. There is no mass.     Tenderness: There is no abdominal tenderness.  Musculoskeletal:        General: No edema.     Cervical back: Normal range of motion and neck supple.  Lymphadenopathy:     Cervical: No cervical adenopathy.  Skin:    General: Skin is warm and dry.     Findings: No rash.  Neurological:     Mental Status: She is alert.     Cranial Nerves: No cranial nerve deficit.     Sensory: No sensory deficit.     Motor: No tremor.     Coordination: Coordination normal.     Deep Tendon Reflexes: Reflexes are normal and symmetric. Reflexes normal.  Psychiatric:        Attention and Perception: Attention normal.        Mood and Affect: Mood and affect and mood normal.        Speech: Speech normal.        Behavior: Behavior normal.        Cognition and Memory: Cognition and memory normal.           Assessment & Plan:   Problem List Items Addressed This Visit      Other  ADD (attention deficit disorder) - Primary    Newly diagnosed  Reviewed eval/testing  by Dr Lurline Hare Disc tx options Will start adderall 20 mg bid Disc side eff and potential for abuse and method of px inst to call if any side eff or not helpful  Understands appetite effects and possible sleep problems if taking too late  Enc self care incl exercise and outdoor time and breaks Also organization at work/home  Also dx with social anxiety -may also affect work  Ref made for counseling as well  She will update in a month re: progress and we can titrate or change med if needed       Relevant Orders   Ambulatory referral to Psychology   Social anxiety disorder    Reviewed testing and recommendation from session with Dr Lurline Hare  Disc how this affects her work and social life Reviewed stressors/ coping techniques/symptoms/ support sources/ tx options and side effects in detail today Ref made for counseling/ virtual ok for this and new Dx of ADD      Relevant Orders   Ambulatory referral to Psychology

## 2020-07-30 NOTE — Telephone Encounter (Signed)
PA done at [email or web address] for Adderall, I will await a response

## 2020-07-30 NOTE — Assessment & Plan Note (Signed)
Reviewed testing and recommendation from session with Dr Lurline Hare  Disc how this affects her work and social life Reviewed stressors/ coping techniques/symptoms/ support sources/ tx options and side effects in detail today Ref made for counseling/ virtual ok for this and new Dx of ADD

## 2020-07-30 NOTE — Assessment & Plan Note (Signed)
Newly diagnosed  Reviewed eval/testing by Dr Lurline Hare Disc tx options Will start adderall 20 mg bid Disc side eff and potential for abuse and method of px inst to call if any side eff or not helpful  Understands appetite effects and possible sleep problems if taking too late  Enc self care incl exercise and outdoor time and breaks Also organization at work/home  Also dx with social anxiety -may also affect work  Ref made for counseling as well  She will update in a month re: progress and we can titrate or change med if needed

## 2020-08-02 ENCOUNTER — Other Ambulatory Visit: Payer: Self-pay | Admitting: Family Medicine

## 2020-08-02 DIAGNOSIS — F4010 Social phobia, unspecified: Secondary | ICD-10-CM

## 2020-08-02 DIAGNOSIS — F900 Attention-deficit hyperactivity disorder, predominantly inattentive type: Secondary | ICD-10-CM

## 2020-08-02 NOTE — Progress Notes (Signed)
I want to change pt's psychology ref to external  This is for ADD and social anxiety   From [place]: I have spoke to [place] and she is looking for Add therapy and currently Dr. Lurline Hare is not taking new  Therapy patients,I did let her know about [facility] [place] [facility] (I offered her the numbers to call) but she asked that her referral be placed  There instead.   Thanks

## 2020-08-03 NOTE — Progress Notes (Signed)
Spoke with patient. Advised patient that I will go ahead and send her referral to [facility] but she would need to call to schedule with them directly. Patient expressed understanding.

## 2020-08-03 NOTE — Telephone Encounter (Signed)
PA was approved. Pharmacy notified, fax placed in PCP's inbox to sign and send for scanning

## 2020-08-18 ENCOUNTER — Encounter: Payer: Self-pay | Admitting: Family Medicine

## 2020-08-18 ENCOUNTER — Ambulatory Visit
Admission: EM | Admit: 2020-08-18 | Discharge: 2020-08-18 | Disposition: A | Discharge status: Home / Self Care | Payer: BC Managed Care – PPO

## 2020-08-18 MED ORDER — FLUCONAZOLE 150 MG PO TABS: 150.0000 mg | ORAL_TABLET | Freq: Once | ORAL | 0 refills | Status: AC

## 2020-08-20 ENCOUNTER — Encounter: Payer: Self-pay | Admitting: Family Medicine

## 2020-08-20 ENCOUNTER — Ambulatory Visit
Admission: EM | Admit: 2020-08-20 | Discharge: 2020-08-20 | Disposition: A | Discharge status: Home / Self Care | Payer: BC Managed Care – PPO | Attending: Family Medicine | Admitting: Family Medicine

## 2020-08-20 ENCOUNTER — Ambulatory Visit: Admit: 2020-08-20 | Disposition: A | Discharge status: Home / Self Care | Payer: BC Managed Care – PPO

## 2020-08-20 DIAGNOSIS — M545 Low back pain, unspecified: Secondary | ICD-10-CM | POA: Insufficient documentation

## 2020-08-20 LAB — POCT URINALYSIS DIP (MANUAL ENTRY)
Bilirubin, UA: NEGATIVE
Glucose, UA: NEGATIVE mg/dL
Ketones, POC UA: NEGATIVE mg/dL
Leukocytes, UA: NEGATIVE
Nitrite, UA: NEGATIVE
Protein Ur, POC: NEGATIVE mg/dL
Spec Grav, UA: >=1.03 — AB (ref 1.010–1.025)
Urobilinogen, UA: 0.2 E.U./dL
pH, UA: 5.5 (ref 5.0–8.0)

## 2020-08-20 MED ORDER — IBUPROFEN 600 MG PO TABS: 600.0000 mg | ORAL_TABLET | Freq: Three times a day (TID) | ORAL | 0 refills | Status: DC | PRN

## 2020-08-20 MED ORDER — CYCLOBENZAPRINE HCL 5 MG PO TABS: 5.0000 mg | ORAL_TABLET | Freq: Three times a day (TID) | ORAL | 0 refills | Status: DC | PRN

## 2020-08-20 NOTE — ED Triage Notes (Signed)
Pt presents with c/o lower back pain that began a week ago, was seen by chiropractor  But is now thinking it could be uti

## 2020-08-20 NOTE — ED Provider Notes (Signed)
[name]    CSN: [id number] Arrival date & time: 08/20/20  [id number]      History   Chief Complaint Chief Complaint  Patient presents with  . Back Pain    HPI Brittney [name] is a 40 y.o. female.   Patient is a 40 year old female presents today with complaints of lower back pain.  More to the right side. Radiates into the buttocks.  This started a week ago.  Had an adjustment normal chiropractor and felt better but woke up this morning feeling worse.  Concerned for possible UTI.  No fevers, chills, nausea or vomiting.  No dysuria, hematuria or urinary frequency.  Mild urinary retention.  Has been drinking a lot of water. No numbness, tingling.      Past Medical History:  Diagnosis Date  . Anemia   . B12 deficiency   . Back pain   . Colon polyps   . Coronary artery abnormality    spasms   . 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 reports today has not had any spasms or chest pains in over 2 years    . Fibroids   . Gallstones   . Irritable bowel syndrome   . Joint pain   . Lactose intolerance   . Migraine    hasnt had one in a long time  . Obesity, unspecified   . Vitamin D deficiency     Patient Active Problem List   Diagnosis Date Noted  . ADD (attention deficit disorder) 07/30/2020  . Social anxiety disorder 07/30/2020  . Back pain 03/20/2020  . Attention and concentration deficit 12/28/2019  . Allergic rhinitis 12/11/2019  . Ketosis ([place]) 10/16/2019  . Thirst 10/16/2019  . Vitamin D deficiency 09/16/2019  . Mild reactive airways disease 03/20/2019  . Hot flashes 02/20/2019  . Constipation 02/20/2019  . Fatigue 02/20/2019  . Right knee pain 10/17/2018  . Benign paroxysmal positional vertigo 05/16/2018  . Postoperative state 01/03/2018  . Lipoma of back 10/04/2017  . Acute sinusitis 10/04/2017  .  Routine general medical examination at a health care facility 08/29/2017  . Fibrocystic breast changes 01/08/2016  . Hemorrhoids, external 09/30/2014  . Chronic diarrhea 08/29/2014  . 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 Hemorrhoids, internal, with bleeding 04/22/2014  . Family history of pulmonary embolism 09/02/2013  . Family history of diabetes mellitus 09/02/2013  . Cutaneous skin tags 05/15/2013  . 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 Morbid obesity ([place]) 03/31/2008  . HEARING LOSS, MILD 03/31/2008  . MIGRAINE HEADACHE 02/20/2007  . IBS 02/20/2007  . OSTEOARTHRITIS 02/20/2007    Past Surgical History:  Procedure Laterality Date  . CHOLECYSTECTOMY  05/06/11  . COLONOSCOPY  11/2001   polyp; negative pathology  . COLONOSCOPY  09/2004   Negative  . COLONOSCOPY  2017 or 2018 patient  unsure   [facility] ;   . COLONOSCOPY W/ POLYPECTOMY    . CYSTOSCOPY N/A 01/03/2018   Procedure: CYSTOSCOPY;  Surgeon: Bobbye Charleston, MD;  Location: [facility];  Service: Gynecology;  Laterality: N/A;  . ESOPHAGOGASTRODUODENOSCOPY  1/10   normal  . FLEXIBLE SIGMOIDOSCOPY  03/05/2012   Procedure: FLEXIBLE SIGMOIDOSCOPY;  Surgeon: Inda Castle, MD;  Location: [facility];  Service: Endoscopy;  Laterality: N/A;  . LAPAROSCOPIC DECORTICATION / DUBULKING / ABLATION RENAL CYSTS     saline  histogram  . ROBOTIC ASSISTED LAPAROSCOPIC HYSTERECTOMY AND SALPINGECTOMY Bilateral 01/03/2018   Procedure: XI ROBOTIC ASSISTED LAPAROSCOPIC HYSTERECTOMY AND SALPINGECTOMY;  Surgeon: Carrington ClampHorvath, Michelle, MD;  Location: [facility];  Service: Gynecology;  Laterality: Bilateral;  WITH BED AFTER  . TONSILLECTOMY  07/30/07  . Uterine US  09/2005   Large endometrial stripe; small ovarian cyst    OB History    Gravida  2   Para  2   Term  2   Preterm  0   AB  0   Living  2     SAB  0   IAB  0   Ectopic  0   Multiple  0   Live Births  1            Home Medications    Prior to Admission  medications   Medication Sig Start Date End Date Taking? Authorizing Provider  cyclobenzaprine (FLEXERIL) 5 MG tablet Take 1 tablet (5 mg total) by mouth 3 (three) times daily as needed for muscle spasms. 08/20/20  Yes Meeya Goldin A, NP  albuterol (VENTOLIN HFA) 108 (90 Base) MCG/ACT inhaler Inhale 2 puffs into the lungs every 4 (four) hours as needed for wheezing. 12/11/19   Tower, Audrie GallusMarne A, MD  amphetamine-dextroamphetamine (ADDERALL) 20 MG tablet Take one pill by mouth in am and one mid day 07/30/20   Tower, Idamae SchullerMarne A, MD  Azelastine HCl 0.15 % SOLN PLACE 2 SPRAYS INTO THE NOSE DAILY AS NEEDED (ALLERGIES). [facility] 03/04/20   Tower, Audrie GallusMarne A, MD  fluticasone (FLONASE) 50 MCG/ACT nasal spray Place 2 sprays into both nostrils daily as needed (for allergies.). 09/24/19   Tower, Audrie GallusMarne A, MD  hydrocortisone (ANUSOL-HC) 25 MG suppository Place 1 suppository (25 mg total) rectally 2 (two) times daily as needed for hemorrhoids or anal itching. 05/28/19   Tower, Audrie GallusMarne A, MD  ibuprofen (ADVIL) 600 MG tablet Take 1 tablet (600 mg total) by mouth every 8 (eight) hours as needed for fever, headache, mild pain or moderate pain. With food 08/20/20   Dahlia ByesBast, Henley Blyth A, NP  levocetirizine (XYZAL) 5 MG tablet Take 1 tablet (5 mg total) by mouth every evening. 09/24/19   Tower, Audrie GallusMarne A, MD  montelukast (SINGULAIR) 10 MG tablet Take 1 tablet (10 mg total) by mouth at bedtime. 09/24/19   Tower, Audrie GallusMarne A, MD  polyethylene glycol powder (GLYCOLAX/MIRALAX) 17 GM/SCOOP powder Take 17 g by mouth daily. 07/25/19   Corinna CapraBrown, Angel A, DO  triamcinolone cream (KENALOG) 0.1 % Apply 1 application topically 2 (two) times daily as needed (for Eczema). 09/24/19   Tower, Audrie GallusMarne A, MD  Vitamin D, Ergocalciferol, (DRISDOL) 1.25 MG (50000 UNIT) CAPS capsule Take 1 capsule (50,000 Units total) by mouth every 7 (seven) days. 10/29/19   [place] NickelBrown, Angel A, DO    Family History Family History  Problem Relation Age of Onset  . Heart disease Father   .  Hypertension Father   . Heart attack Father   . Diabetes Father   . Sleep apnea Mother   . Obesity Mother   . Ulcers Brother   . Clotting disorder Brother   . Ovarian cancer Maternal Grandmother   . Colon polyps Maternal Grandmother   . Colon cancer Maternal Grandmother        40's  . Diabetes Paternal Grandmother   . Hypertension Paternal Grandmother   . Breast cancer Maternal Aunt        Age 42's  . Diabetes Paternal Aunt  x 5  . Lung cancer Maternal Grandfather        SMOKER    Social History Social History   Tobacco Use  . Smoking status: Never Smoker  . Smokeless tobacco: Never Used  Substance Use Topics  . Alcohol use: No    Alcohol/week: 0.0 standard drinks  . Drug use: No     Allergies   Prevacid [lansoprazole]   Review of Systems Review of Systems   Physical Exam Triage Vital Signs ED Triage Vitals  Enc Vitals Group     BP 08/20/20 0841 105/73     Pulse Rate 08/20/20 0841 67     Resp 08/20/20 0841 18     Temp 08/20/20 0841 98.5 F (36.9 C)     Temp src --      SpO2 08/20/20 0841 99 %     Weight --      Height --      Head Circumference --      Peak Flow --      Pain Score 08/20/20 0839 8     Pain Loc --      Pain Edu? --      Excl. in [place]? --    No data found.  Updated Vital Signs BP 105/73   Pulse 67   Temp 98.5 F (36.9 C)   Resp 18   LMP 12/21/2017   SpO2 99%   Visual Acuity Right Eye Distance:   Left Eye Distance:   Bilateral Distance:    Right Eye Near:   Left Eye Near:    Bilateral Near:     Physical Exam Vitals and nursing note reviewed.  Constitutional:      General: She is not in acute distress.    Appearance: Normal appearance. She is not ill-appearing, toxic-appearing or diaphoretic.  HENT:     Head: Normocephalic.     Nose: Nose normal.  Eyes:     Conjunctiva/sclera: Conjunctivae normal.  Pulmonary:     Effort: Pulmonary effort is normal.  Musculoskeletal:        General: Normal range of motion.      Cervical back: Normal range of motion.       Back:     Comments: TTP  Skin:    General: Skin is warm and dry.     Findings: No rash.  Neurological:     Mental Status: She is alert.  Psychiatric:        Mood and Affect: Mood normal.      UC Treatments / Results  Labs (all labs ordered are listed, but only abnormal results are displayed) Labs Reviewed  POCT URINALYSIS DIP (MANUAL ENTRY) - Abnormal; Notable for the following components:      Result Value   Spec Grav, UA >=1.030 (*)    Blood, UA trace-intact (*)    All other components within normal limits  URINE CULTURE    EKG   Radiology No results found.  Procedures Procedures (including critical care time)  Medications Ordered in UC Medications - No data to display  Initial Impression / Assessment and Plan / UC Course  I have reviewed the triage vital signs and the nursing notes.  Pertinent labs & imaging results that were available during my care of the patient were reviewed by me and considered in my medical decision making (see chart for details).     Acute right-sided lower back pain Urine with trace blood but no signs of infection today.  Sending for  culture.  Possibility of kidney stone?  Believe it most likely is sciatic nerve pain due to the radiation down the buttocks area. We will have her trial ibuprofen every 8 hours along with Flexeril as needed for muscle laxation.  Recommend heat to the area, gentle stretching. Also recommended to the ER for worsening symptoms and concerns for kidney stone. Follow up as needed for continued or worsening symptoms  Final Clinical Impressions(s) / UC Diagnoses   Final diagnoses:  Acute right-sided low back pain, unspecified whether sciatica present     Discharge Instructions     Urine with trace blood. Sending for culture As we spoke about could be kidney stone versus some muscle tension and sciatic nerve inflammation. We will treat with 600 of ibuprofen  every 8 hours and Flexeril for muscle relaxation as needed.  Be aware the Flexeril may make you slightly drowsy. Recommend heat to the area, gentle stretching For any worsening or severe pain you have to go to the ER for CT scan in case this is a kidney stone.  Follow up as needed for continued or worsening symptoms     ED Prescriptions    Medication Sig Dispense Auth. Provider   cyclobenzaprine (FLEXERIL) 5 MG tablet Take 1 tablet (5 mg total) by mouth 3 (three) times daily as needed for muscle spasms. 30 tablet Valree Feild A, NP   ibuprofen (ADVIL) 600 MG tablet Take 1 tablet (600 mg total) by mouth every 8 (eight) hours as needed for fever, headache, mild pain or moderate pain. With food 30 tablet Nickoli Bagheri A, NP     PDMP not reviewed this encounter.   Janace Aris, NP 08/20/20 1352

## 2020-08-20 NOTE — Discharge Instructions (Addendum)
Urine with trace blood. Sending for culture As we spoke about could be kidney stone versus some muscle tension and sciatic nerve inflammation. We will treat with 600 of ibuprofen every 8 hours and Flexeril for muscle relaxation as needed.  Be aware the Flexeril may make you slightly drowsy. Recommend heat to the area, gentle stretching For any worsening or severe pain you have to go to the ER for CT scan in case this is a kidney stone.  Follow up as needed for continued or worsening symptoms

## 2020-08-21 LAB — URINE CULTURE: Culture: <10000 — AB

## 2020-08-26 ENCOUNTER — Ambulatory Visit: Payer: Self-pay | Admitting: Family Medicine

## 2020-08-27 ENCOUNTER — Ambulatory Visit: Payer: Self-pay | Admitting: Family Medicine

## 2020-08-28 ENCOUNTER — Ambulatory Visit: Payer: Self-pay | Admitting: Family Medicine

## 2020-08-28 DIAGNOSIS — Z0289 Encounter for other administrative examinations: Secondary | ICD-10-CM

## 2020-08-31 ENCOUNTER — Other Ambulatory Visit: Payer: Self-pay | Admitting: Family Medicine

## 2020-08-31 ENCOUNTER — Other Ambulatory Visit (INDEPENDENT_AMBULATORY_CARE_PROVIDER_SITE_OTHER): Payer: Self-pay | Admitting: Bariatrics

## 2020-08-31 DIAGNOSIS — E559 Vitamin D deficiency, unspecified: Secondary | ICD-10-CM

## 2020-09-01 MED ORDER — AMPHETAMINE-DEXTROAMPHETAMINE 20 MG PO TABS: ORAL_TABLET | ORAL | 0 refills | Status: DC

## 2020-09-01 NOTE — Telephone Encounter (Signed)
Refill request for Adderall 20 mg tabs.  LOV - 07/30/20 Next OV -  Not scheduled  Last refill - 07/30/20

## 2020-09-01 NOTE — Telephone Encounter (Signed)
Pt was last seen by Dr. Owens Shark in March of 2021. Pt does not have an existing appt.

## 2020-09-10 ENCOUNTER — Other Ambulatory Visit: Payer: Self-pay | Admitting: Family Medicine

## 2020-09-10 DIAGNOSIS — Z1231 Encounter for screening mammogram for malignant neoplasm of breast: Secondary | ICD-10-CM

## 2020-09-30 ENCOUNTER — Other Ambulatory Visit: Payer: Self-pay | Admitting: Family Medicine

## 2020-10-05 ENCOUNTER — Other Ambulatory Visit: Payer: Self-pay

## 2020-10-05 ENCOUNTER — Ambulatory Visit
Admission: RE | Admit: 2020-10-05 | Discharge: 2020-10-05 | Disposition: A | Discharge status: Home / Self Care | Payer: BC Managed Care – PPO | Source: Ambulatory Visit

## 2020-10-05 DIAGNOSIS — Z1231 Encounter for screening mammogram for malignant neoplasm of breast: Secondary | ICD-10-CM

## 2020-10-08 ENCOUNTER — Other Ambulatory Visit: Payer: Self-pay | Admitting: Family Medicine

## 2020-10-09 MED ORDER — AMPHETAMINE-DEXTROAMPHETAMINE 20 MG PO TABS: ORAL_TABLET | ORAL | 0 refills | Status: DC

## 2020-10-09 NOTE — Telephone Encounter (Signed)
Name of Medication: Adderall Name of Pharmacy: [facility] [address] Last Venida Jarvis or Written Date and Quantity: 09/01/20 #60 tabs/ 0 refills Last Office Visit and Type: ADHD on 07/1620 Next Office Visit and Type: none scheduled

## 2020-10-13 ENCOUNTER — Encounter: Payer: Self-pay | Admitting: Family Medicine

## 2020-10-14 MED ORDER — HYDROCORTISONE (PERIANAL) 2.5 % EX CREA: 1.0000 "application " | TOPICAL_CREAM | Freq: Two times a day (BID) | CUTANEOUS | 0 refills | Status: DC

## 2020-10-15 MED ORDER — FLUCONAZOLE 150 MG PO TABS: 150.0000 mg | ORAL_TABLET | Freq: Once | ORAL | 0 refills | Status: AC

## 2020-10-15 NOTE — Addendum Note (Signed)
Addended by: Loura Pardon A on: 10/15/2020 08:19 AM   Modules accepted: Orders

## 2020-10-21 ENCOUNTER — Other Ambulatory Visit: Payer: Self-pay | Admitting: Family Medicine

## 2020-10-21 ENCOUNTER — Ambulatory Visit: Payer: BC Managed Care – PPO | Admitting: Family Medicine

## 2020-10-22 ENCOUNTER — Ambulatory Visit: Payer: BC Managed Care – PPO | Admitting: Family Medicine

## 2020-10-26 ENCOUNTER — Encounter: Payer: Self-pay | Admitting: Family Medicine

## 2020-10-26 ENCOUNTER — Other Ambulatory Visit: Payer: Self-pay

## 2020-10-26 ENCOUNTER — Ambulatory Visit: Payer: BC Managed Care – PPO | Admitting: Family Medicine

## 2020-10-26 DIAGNOSIS — K148 Other diseases of tongue: Secondary | ICD-10-CM | POA: Diagnosis not present

## 2020-10-26 MED ORDER — TRIAMCINOLONE ACETONIDE 0.1 % MT PSTE: 1.0000 "application " | PASTE | Freq: Two times a day (BID) | OROMUCOSAL | 1 refills | Status: DC

## 2020-10-26 MED ORDER — TRIAMCINOLONE ACETONIDE 0.1 % EX CREA: 1.0000 "application " | TOPICAL_CREAM | Freq: Two times a day (BID) | CUTANEOUS | 3 refills | Status: DC | PRN

## 2020-10-26 NOTE — Patient Instructions (Addendum)
Try the kenalog in orabase gel twice day to spot that bothers you  I will place a referral to ENT to look at your tongue lesion   In something changes in the meantime let us know

## 2020-10-26 NOTE — Progress Notes (Signed)
Subjective:    Patient ID: Brittney [name], female    DOB: 02-08-1981, 40 y.o.   MRN: [id number]  This visit occurred during the SARS-CoV-2 public health emergency.  Safety protocols were in place, including screening questions prior to the visit, additional usage of staff PPE, and extensive cleaning of exam room while observing appropriate contact time as indicated for disinfecting solutions.    HPI Pt presents for painful bumps on tongue   Wt Readings from Last 3 Encounters:  10/26/20 243 lb 8 oz (110.5 kg)  07/30/20 253 lb 8 oz (115 kg)  06/22/20 254 lb 4 oz (115.3 kg)   38.14 kg/m  She has a bump on her tongue   Has been there more than 6 months she thinks  Getting bigger and more bothersome  It hurts- hard to explain what type of discomfort  No other area   Patient Active Problem List   Diagnosis Date Noted  . Tongue lesion 10/26/2020  . ADD (attention deficit disorder) 07/30/2020  . Social anxiety disorder 07/30/2020  . Back pain 03/20/2020  . Attention and concentration deficit 12/28/2019  . Allergic rhinitis 12/11/2019  . Ketosis ([place]) 10/16/2019  . Thirst 10/16/2019  . Vitamin D deficiency 09/16/2019  . Mild reactive airways disease 03/20/2019  . Hot flashes 02/20/2019  . Constipation 02/20/2019  . Fatigue 02/20/2019  . Right knee pain 10/17/2018  . Benign paroxysmal positional vertigo 05/16/2018  . Postoperative state 01/03/2018  . Lipoma of back 10/04/2017  . Acute sinusitis 10/04/2017  . Routine general medical examination at a health care facility 08/29/2017  . Fibrocystic breast changes 01/08/2016  . Hemorrhoids, external 09/30/2014  . Chronic diarrhea 08/29/2014  . 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 Hemorrhoids, internal, with bleeding 04/22/2014  . Family history of pulmonary embolism 09/02/2013  . Family history of diabetes mellitus 09/02/2013  . Cutaneous skin tags 05/15/2013  . 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 Morbid  obesity ([place]) 03/31/2008  . HEARING LOSS, MILD 03/31/2008  . MIGRAINE HEADACHE 02/20/2007  . IBS 02/20/2007  . OSTEOARTHRITIS 02/20/2007   Past Medical History:  Diagnosis Date  . Anemia   . B12 deficiency   . Back pain   . Colon polyps   . Coronary artery abnormality    spasms   . 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 reports today has not had any spasms or chest pains in over 2 years    . Fibroids   . Gallstones   . Irritable bowel syndrome   . Joint pain   . Lactose intolerance   . Migraine    hasnt had one in a long time  . Obesity, unspecified   . Vitamin D deficiency    Past Surgical History:  Procedure Laterality Date  . CHOLECYSTECTOMY  05/06/11  . COLONOSCOPY  11/2001   polyp; negative pathology  . COLONOSCOPY  09/2004   Negative  . COLONOSCOPY  2017 or 2018 patient  unsure   [facility] ;   . COLONOSCOPY W/ POLYPECTOMY    . CYSTOSCOPY N/A 01/03/2018   Procedure: CYSTOSCOPY;  Surgeon: Bobbye Charleston, MD;  Location: [facility];  Service: Gynecology;  Laterality: N/A;  . ESOPHAGOGASTRODUODENOSCOPY  1/10   normal  . FLEXIBLE SIGMOIDOSCOPY  03/05/2012   Procedure: FLEXIBLE SIGMOIDOSCOPY;  Surgeon: Inda Castle, MD;  Location: [facility];  Service: Endoscopy;  Laterality: N/A;  . LAPAROSCOPIC DECORTICATION / DUBULKING / ABLATION RENAL CYSTS     saline histogram  . ROBOTIC ASSISTED LAPAROSCOPIC HYSTERECTOMY AND SALPINGECTOMY Bilateral 01/03/2018   Procedure: XI ROBOTIC ASSISTED LAPAROSCOPIC HYSTERECTOMY AND SALPINGECTOMY;  Surgeon: Bobbye Charleston, MD;  Location: [facility];  Service: Gynecology;  Laterality: Bilateral;  WITH BED AFTER  . TONSILLECTOMY  07/30/07  . Uterine US  09/2005   Large endometrial stripe; small ovarian cyst   Social History   Tobacco Use  . Smoking status: Never Smoker  . Smokeless tobacco: Never  Used  Substance Use Topics  . Alcohol use: No    Alcohol/week: 0.0 standard drinks  . Drug use: No   Family History  Problem Relation Age of Onset  . Heart disease Father   . Hypertension Father   . Heart attack Father   . Diabetes Father   . Sleep apnea Mother   . Obesity Mother   . Ulcers Brother   . Clotting disorder Brother   . Ovarian cancer Maternal Grandmother   . Colon polyps Maternal Grandmother   . Colon cancer Maternal Grandmother        40's  . Diabetes Paternal Grandmother   . Hypertension Paternal Grandmother   . Breast cancer Maternal Aunt        Age 80's  . Diabetes Paternal Aunt        x 5  . Lung cancer Maternal Grandfather        SMOKER   Allergies  Allergen Reactions  . Prevacid [Lansoprazole] Rash   Current Outpatient Medications on File Prior to Visit  Medication Sig Dispense Refill  . albuterol (VENTOLIN HFA) 108 (90 Base) MCG/ACT inhaler Inhale 2 puffs into the lungs every 4 (four) hours as needed for wheezing. 18 g 3  . amphetamine-dextroamphetamine (ADDERALL) 20 MG tablet Take one pill by mouth in am and one mid day 60 tablet 0  . Azelastine HCl 0.15 % SOLN PLACE 2 SPRAYS INTO THE NOSE DAILY AS NEEDED (ALLERGIES). IN EACH NOSTRIL 90 mL 1  . fluticasone (FLONASE) 50 MCG/ACT nasal spray PLACE 2 SPRAYS INTO BOTH NOSTRILS DAILY AS NEEDED (FOR ALLERGIES.). 48 mL 1  . hydrocortisone (ANUSOL-HC) 2.5 % rectal cream Place 1 application rectally 2 (two) times daily. 30 g 0  . hydrocortisone (ANUSOL-HC) 25 MG suppository Place 1 suppository (25 mg total) rectally 2 (two) times daily as needed for hemorrhoids or anal itching. 12 suppository 1  . ibuprofen (ADVIL) 600 MG tablet Take 1 tablet (600 mg total) by mouth every 8 (eight) hours as needed for fever, headache, mild pain or moderate pain. With food 30 tablet 0  . levocetirizine (XYZAL) 5 MG tablet Take 1 tablet (5 mg total) by mouth every evening. 90 tablet 3  . montelukast (SINGULAIR) 10 MG tablet TAKE 1  TABLET BY MOUTH EVERYDAY AT BEDTIME 90 tablet 1  . polyethylene glycol powder (GLYCOLAX/MIRALAX) 17 GM/SCOOP powder Take 17 g by mouth daily. 3350 g 0   No current facility-administered medications on file prior to visit.     Review of Systems  Constitutional: Negative for activity change, appetite change, fatigue, fever and unexpected weight change.  HENT: Negative for congestion, ear pain, rhinorrhea, sinus pressure and sore throat.        Bump on tongue   Eyes: Negative for pain, redness and visual disturbance.  Respiratory: Negative for cough, shortness of breath and wheezing.   Cardiovascular: Negative for chest  pain and palpitations.  Gastrointestinal: Negative for abdominal pain, blood in stool, constipation and diarrhea.  Endocrine: Negative for polydipsia and polyuria.  Genitourinary: Negative for dysuria, frequency and urgency.  Musculoskeletal: Negative for arthralgias, back pain and myalgias.  Skin: Negative for pallor and rash.  Allergic/Immunologic: Negative for environmental allergies.  Neurological: Negative for dizziness, syncope and headaches.  Hematological: Negative for adenopathy. Does not bruise/bleed easily.  Psychiatric/Behavioral: Negative for decreased concentration and dysphoric mood. The patient is not nervous/anxious.        Objective:   Physical Exam Constitutional:      General: She is not in acute distress.    Appearance: Normal appearance. She is obese. She is not ill-appearing.  HENT:     Head: Normocephalic and atraumatic.     Nose: Nose normal.     Mouth/Throat:     Mouth: Mucous membranes are moist.     Pharynx: Oropharynx is clear. No oropharyngeal exudate or posterior oropharyngeal erythema.     Comments: 2-3 mm smooth round bump R tip of tongue , symmetric and mildly tender with tongue blade  No evidence of thrush   Eyes:     General:        Right eye: No discharge.        Left eye: No discharge.     Conjunctiva/sclera: Conjunctivae  normal.     Pupils: Pupils are equal, round, and reactive to light.  Cardiovascular:     Rate and Rhythm: Normal rate and regular rhythm.  Pulmonary:     Effort: Pulmonary effort is normal.     Breath sounds: Normal breath sounds.  Musculoskeletal:     Cervical back: Neck supple.  Lymphadenopathy:     Cervical: No cervical adenopathy.  Skin:    Findings: No erythema or rash.  Neurological:     Mental Status: She is alert.     Cranial Nerves: No cranial nerve deficit.  Psychiatric:        Mood and Affect: Mood normal.           Assessment & Plan:   Problem List Items Addressed This Visit      Other   Tongue lesion    Midline tip of tongue - resembles enlarged papillae  ? Transient lingual papillitis (but appears bigger than average)  No h/o trauma or dental work or new exposures This is bothersome and not resolving Ref done to ENT for further eval  Counseled to avoid foods that bother it or very hot things Kenalog in orabase gel px to help symptoms

## 2020-10-26 NOTE — Assessment & Plan Note (Addendum)
Midline tip of tongue - resembles enlarged papillae  ? Transient lingual papillitis (but appears bigger than average)  No h/o trauma or dental work or new exposures This is bothersome and not resolving Ref done to ENT for further eval  Counseled to avoid foods that bother it or very hot things Kenalog in orabase gel px to help symptoms

## 2020-10-27 ENCOUNTER — Ambulatory Visit: Payer: BC Managed Care – PPO | Admitting: Family Medicine

## 2020-11-07 ENCOUNTER — Other Ambulatory Visit: Payer: Self-pay | Admitting: Family Medicine

## 2020-11-09 MED ORDER — AMPHETAMINE-DEXTROAMPHETAMINE 20 MG PO TABS: ORAL_TABLET | ORAL | 0 refills | Status: DC

## 2020-11-09 NOTE — Telephone Encounter (Signed)
Name of Medication: Adderall Name of Pharmacy: [facility] [address] Last Venida Jarvis or Written Date and Quantity: 10/09/20 #60 tabs/ 0 refills Last Office Visit and Type: 10/26/20 Bump on tongue (ADHD on 07/30/20) Next Office Visit and Type: none scheduled

## 2020-11-10 ENCOUNTER — Ambulatory Visit
Admission: EM | Admit: 2020-11-10 | Discharge: 2020-11-10 | Disposition: A | Discharge status: Home / Self Care | Payer: BC Managed Care – PPO

## 2020-11-10 ENCOUNTER — Other Ambulatory Visit: Payer: Self-pay

## 2020-11-10 DIAGNOSIS — B349 Viral infection, unspecified: Secondary | ICD-10-CM | POA: Diagnosis not present

## 2020-11-10 NOTE — Discharge Instructions (Addendum)
I believe this is viral illness or allergies Continue the allergy medicines OTC cough and cold medicines as needed.  Follow up as needed for continued or worsening symptoms

## 2020-11-10 NOTE — ED Triage Notes (Signed)
Cough, congestion, bodyaches x 1 day.  No fever.  Home COVID test was negative.

## 2020-11-10 NOTE — ED Provider Notes (Signed)
[name]    CSN: [id number] Arrival date & time: 11/10/20  1412      History   Chief Complaint Chief Complaint  Patient presents with  . Cough  . Nasal Congestion    HPI Brittney [name] is a 40 y.o. female.   Patient is a 40 year old female who presents today with cough, congestion, body aches x1 day.  Symptoms have been constant.  Does have a history of allergies and takes allergy medication.  No fever.  Negative Covid test at home.     Past Medical History:  Diagnosis Date  . Anemia   . B12 deficiency   . Back pain   . Colon polyps   . Coronary artery abnormality    spasms   . 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 reports today has not had any spasms or chest pains in over 2 years    . Fibroids   . Gallstones   . Irritable bowel syndrome   . Joint pain   . Lactose intolerance   . Migraine    hasnt had one in a long time  . Obesity, unspecified   . Vitamin D deficiency     Patient Active Problem List   Diagnosis Date Noted  . Tongue lesion 10/26/2020  . ADD (attention deficit disorder) 07/30/2020  . Social anxiety disorder 07/30/2020  . Back pain 03/20/2020  . Attention and concentration deficit 12/28/2019  . Allergic rhinitis 12/11/2019  . 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 Thirst 10/16/2019  . Vitamin D deficiency 09/16/2019  . Mild reactive airways disease 03/20/2019  . Hot flashes 02/20/2019  . Constipation 02/20/2019  . Fatigue 02/20/2019  . Right knee pain 10/17/2018  . Benign paroxysmal positional vertigo 05/16/2018  . Postoperative state 01/03/2018  . Lipoma of back 10/04/2017  . Acute sinusitis 10/04/2017  . Routine general medical examination at a health care facility 08/29/2017  . Fibrocystic breast changes 01/08/2016  . Hemorrhoids, external 09/30/2014  . Chronic diarrhea  08/29/2014  . 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 Hemorrhoids, internal, with bleeding 04/22/2014  . Family history of pulmonary embolism 09/02/2013  . Family history of diabetes mellitus 09/02/2013  . Cutaneous skin tags 05/15/2013  . 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 Morbid obesity ([place]) 03/31/2008  . HEARING LOSS, MILD 03/31/2008  . MIGRAINE HEADACHE 02/20/2007  . IBS 02/20/2007  . OSTEOARTHRITIS 02/20/2007    Past Surgical History:  Procedure Laterality Date  . CHOLECYSTECTOMY  05/06/11  . COLONOSCOPY  11/2001   polyp; negative pathology  . COLONOSCOPY  09/2004   Negative  . COLONOSCOPY  2017 or 2018 patient  unsure   [facility] ;   . COLONOSCOPY W/ POLYPECTOMY    . CYSTOSCOPY N/A 01/03/2018   Procedure: CYSTOSCOPY;  Surgeon: Bobbye Charleston, MD;  Location: [facility];  Service: Gynecology;  Laterality: N/A;  . ESOPHAGOGASTRODUODENOSCOPY  1/10   normal  . FLEXIBLE SIGMOIDOSCOPY  03/05/2012   Procedure: FLEXIBLE SIGMOIDOSCOPY;  Surgeon: Inda Castle, MD;  Location: [facility];  Service: Endoscopy;  Laterality: N/A;  . LAPAROSCOPIC DECORTICATION / DUBULKING / ABLATION RENAL CYSTS     saline histogram  . ROBOTIC ASSISTED LAPAROSCOPIC HYSTERECTOMY AND SALPINGECTOMY Bilateral 01/03/2018   Procedure: XI ROBOTIC ASSISTED LAPAROSCOPIC HYSTERECTOMY AND SALPINGECTOMY;  Surgeon: Bobbye Charleston, MD;  Location: [facility];  Service:  Gynecology;  Laterality: Bilateral;  WITH BED AFTER  . TONSILLECTOMY  07/30/07  . Uterine US  09/2005   Large endometrial stripe; small ovarian cyst    OB History    Gravida  2   Para  2   Term  2   Preterm  0   AB  0   Living  2     SAB  0   IAB  0   Ectopic  0   Multiple  0   Live Births  1            Home Medications    Prior to Admission medications   Medication Sig Start Date End Date Taking? Authorizing Provider  albuterol (VENTOLIN HFA) 108 (90 Base) MCG/ACT inhaler Inhale 2 puffs into the lungs every 4 (four)  hours as needed for wheezing. 12/11/19   Tower, Wynelle Fanny, MD  amphetamine-dextroamphetamine (ADDERALL) 20 MG tablet Take one pill by mouth in am and one mid day 11/09/20   Tower, Roque Lias A, MD  Azelastine HCl 0.15 % SOLN PLACE 2 SPRAYS INTO THE NOSE DAILY AS NEEDED (ALLERGIES). IN [facility] NOSTRIL 03/04/20   Tower, Roque Lias A, MD  fluticasone (FLONASE) 50 MCG/ACT nasal spray PLACE 2 SPRAYS INTO BOTH NOSTRILS DAILY AS NEEDED (FOR ALLERGIES.). 09/30/20   Tower, Wynelle Fanny, MD  hydrocortisone (ANUSOL-HC) 2.5 % rectal cream Place 1 application rectally 2 (two) times daily. 10/14/20   Tower, Wynelle Fanny, MD  hydrocortisone (ANUSOL-HC) 25 MG suppository Place 1 suppository (25 mg total) rectally 2 (two) times daily as needed for hemorrhoids or anal itching. 05/28/19   Tower, Wynelle Fanny, MD  ibuprofen (ADVIL) 600 MG tablet Take 1 tablet (600 mg total) by mouth every 8 (eight) hours as needed for fever, headache, mild pain or moderate pain. With food 08/20/20   Loura Halt A, NP  levocetirizine (XYZAL) 5 MG tablet Take 1 tablet (5 mg total) by mouth every evening. 09/24/19   Tower, Wynelle Fanny, MD  montelukast (SINGULAIR) 10 MG tablet TAKE 1 TABLET BY MOUTH EVERYDAY AT BEDTIME 10/21/20   Tower, [place] A, MD  polyethylene glycol powder (GLYCOLAX/MIRALAX) 17 GM/SCOOP powder Take 17 g by mouth daily. 07/25/19   Jearld Lesch A, DO  triamcinolone (KENALOG) 0.1 % paste Use as directed 1 application in the mouth or throat 2 (two) times daily. To affected area 10/26/20   Tower, Wynelle Fanny, MD  triamcinolone (KENALOG) 0.1 % Apply 1 application topically 2 (two) times daily as needed (for Eczema). 10/26/20   Tower, Wynelle Fanny, MD    Family History Family History  Problem Relation Age of Onset  . Heart disease Father   . Hypertension Father   . Heart attack Father   . Diabetes Father   . Sleep apnea Mother   . Obesity Mother   . Ulcers Brother   . Clotting disorder Brother   . Ovarian cancer Maternal Grandmother   . Colon polyps Maternal Grandmother    . Colon cancer Maternal Grandmother        40's  . Diabetes Paternal Grandmother   . Hypertension Paternal Grandmother   . Breast cancer Maternal Aunt        Age 71's  . Diabetes Paternal Aunt        x 5  . Lung cancer Maternal Grandfather        SMOKER    Social History Social History   Tobacco Use  . Smoking status: Never Smoker  . Smokeless tobacco: Never  Used  Vaping Use  . Vaping Use: Never used  Substance Use Topics  . Alcohol use: No    Alcohol/week: 0.0 standard drinks  . Drug use: No     Allergies   Prevacid [lansoprazole]   Review of Systems Review of Systems   Physical Exam Triage Vital Signs ED Triage Vitals  Enc Vitals Group     BP 11/10/20 1426 121/79     Pulse Rate 11/10/20 1426 68     Resp 11/10/20 1426 14     Temp 11/10/20 1426 98.2 F (36.8 C)     Temp Source 11/10/20 1426 Oral     SpO2 11/10/20 1426 96 %     Weight --      Height --      Head Circumference --      Peak Flow --      Pain Score 11/10/20 1429 5     Pain Loc --      Pain Edu? --      Excl. in [place]? --    No data found.  Updated Vital Signs BP 121/79 (BP Location: Right Arm)   Pulse 68   Temp 98.2 F (36.8 C) (Oral)   Resp 14   LMP 12/21/2017   SpO2 96%   Visual Acuity Right Eye Distance:   Left Eye Distance:   Bilateral Distance:    Right Eye Near:   Left Eye Near:    Bilateral Near:     Physical Exam Vitals and nursing note reviewed.  Constitutional:      General: She is not in acute distress.    Appearance: Normal appearance. She is not ill-appearing, toxic-appearing or diaphoretic.  HENT:     Head: Normocephalic.     Right Ear: Tympanic membrane and ear canal normal.     Left Ear: Tympanic membrane and ear canal normal.     Nose: Congestion present.     Mouth/Throat:     Pharynx: Oropharynx is clear.  Eyes:     Conjunctiva/sclera: Conjunctivae normal.  Cardiovascular:     Rate and Rhythm: Normal rate and regular rhythm.  Pulmonary:      Effort: Pulmonary effort is normal.     Breath sounds: Normal breath sounds.  Musculoskeletal:        General: Normal range of motion.     Cervical back: Normal range of motion.  Skin:    General: Skin is warm and dry.     Findings: No rash.  Neurological:     Mental Status: She is alert.  Psychiatric:        Mood and Affect: Mood normal.      UC Treatments / Results  Labs (all labs ordered are listed, but only abnormal results are displayed) Labs Reviewed - No data to display  EKG   Radiology No results found.  Procedures Procedures (including critical care time)  Medications Ordered in UC Medications - No data to display  Initial Impression / Assessment and Plan / UC Course  I have reviewed the triage vital signs and the nursing notes.  Pertinent labs & imaging results that were available during my care of the patient were reviewed by me and considered in my medical decision making (see chart for details).     Viral illness No concerns on exam.  Recommend continue allergy medication.  Over-the-counter cough and cold medications as needed. Follow up as needed for continued or worsening symptoms  Final Clinical Impressions(s) / UC Diagnoses  Final diagnoses:  Viral illness     Discharge Instructions     I believe this is viral illness or allergies Continue the allergy medicines OTC cough and cold medicines as needed.  Follow up as needed for continued or worsening symptoms     ED Prescriptions    None     PDMP not reviewed this encounter.   Orvan July, NP 11/10/20 1528

## 2020-11-13 ENCOUNTER — Telehealth: Payer: Self-pay

## 2020-11-13 NOTE — Telephone Encounter (Signed)
Pt notified of Dr. Marliss Coots instructions and recommendations and verbalized understanding

## 2020-11-13 NOTE — Telephone Encounter (Signed)
Agree-push fluids and rest.  If breathing worsens let us know (go to ER if severe)  Even if she is not hungry try to eat a little something bland to see if that makes her feel better  Keep [place] posted

## 2020-11-13 NOTE — Telephone Encounter (Signed)
Pt called to report that she had not been feeling a little "off" this morning, she was not able to work out as long and she had a bit of dry mouth and some SOB--seemed to improve after removing mask and taking deep breath... 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 Advised pt to push fluids drinking water  Pt had URI Sx started last weekend, she went to UC and was Dx with virus and neg home covid test on Tues... Symptoms have improved from viral illness...    Please advise

## 2020-11-26 ENCOUNTER — Other Ambulatory Visit: Payer: Self-pay

## 2020-11-26 ENCOUNTER — Encounter: Payer: Self-pay | Admitting: Family Medicine

## 2020-11-26 ENCOUNTER — Ambulatory Visit: Payer: BC Managed Care – PPO | Admitting: Family Medicine

## 2020-11-26 VITALS — BP 136/78 | HR 54 | Temp 97.2°F | Ht 67.0 in | Wt 238.6 lb

## 2020-11-26 DIAGNOSIS — R300 Dysuria: Secondary | ICD-10-CM | POA: Diagnosis not present

## 2020-11-26 DIAGNOSIS — E6609 Other obesity due to excess calories: Secondary | ICD-10-CM

## 2020-11-26 DIAGNOSIS — N898 Other specified noninflammatory disorders of vagina: Secondary | ICD-10-CM | POA: Diagnosis not present

## 2020-11-26 DIAGNOSIS — L918 Other hypertrophic disorders of the skin: Secondary | ICD-10-CM | POA: Diagnosis not present

## 2020-11-26 DIAGNOSIS — Z6837 Body mass index (BMI) 37.0-37.9, adult: Secondary | ICD-10-CM

## 2020-11-26 LAB — POC URINALSYSI DIPSTICK (AUTOMATED)
Bilirubin, UA: NEGATIVE
Blood, UA: NEGATIVE
Glucose, UA: NEGATIVE
Ketones, UA: 5
Leukocytes, UA: NEGATIVE
Nitrite, UA: NEGATIVE
Protein, UA: NEGATIVE
Spec Grav, UA: >=1.03 — AB (ref 1.010–1.025)
Urobilinogen, UA: 0.2 E.U./dL
pH, UA: 6 (ref 5.0–8.0)

## 2020-11-26 LAB — POCT WET PREP (WET MOUNT): Trichomonas Wet Prep HPF POC: ABSENT

## 2020-11-26 MED ORDER — METRONIDAZOLE 0.75 % VA GEL: 1.0000 | Freq: Two times a day (BID) | VAGINAL | 0 refills | Status: DC

## 2020-11-26 NOTE — Assessment & Plan Note (Signed)
Concentrated urine Enc to drink more water Clue cells on wet prep- treated with metro gel vaginal

## 2020-11-26 NOTE — Assessment & Plan Note (Signed)
With some dysuria/discomfort  Declines STD screen Clue cells seen on wt prep  tx for BV with metro gel vaginal and handout given   Meds ordered this encounter  Medications  . metroNIDAZOLE (METROGEL VAGINAL) 0.75 % vaginal gel    Sig: Place 1 Applicatorful vaginally 2 (two) times daily.    Dispense:  70 g    Refill:  0

## 2020-11-26 NOTE — Progress Notes (Signed)
Subjective:    Patient ID: Brittney [name], female    DOB: 1981-05-11, 40 y.o.   MRN: [id number]  This visit occurred during the SARS-CoV-2 public health emergency.  Safety protocols were in place, including screening questions prior to the visit, additional usage of staff PPE, and extensive cleaning of exam room while observing appropriate contact time as indicated for disinfecting solutions.    HPI Pt presents for urinary symptoms and to check skin tags   Wt Readings from Last 3 Encounters:  11/26/20 238 lb 9 oz (108.2 kg)  10/26/20 243 lb 8 oz (110.5 kg)  07/30/20 253 lb 8 oz (115 kg)   37.36 kg/m   Has lost a total of 55 lb! Works out 5 d per week at least Eating well   Would like to get under 200   Has urinated more  Burning a little No blood  Some vaginal d/c Brown in color  Does not have periods  No itching   Results for orders placed or performed in visit on 11/26/20  POCT Urinalysis Dipstick (Automated)  Result Value Ref Range   Color, UA Dark Yellow    Clarity, UA Clear    Glucose, UA Negative Negative   Bilirubin, UA Negative    Ketones, UA 5 mg/dL    Spec Grav, UA >=1.030 (A) 1.010 - 1.025   Blood, UA Negative    pH, UA 6.0 5.0 - 8.0   Protein, UA Negative Negative   Urobilinogen, UA 0.2 0.2 or 1.0 E.U./dL   Nitrite, UA Negative    Leukocytes, UA Negative Negative  POCT Wet Prep ([place])  Result Value Ref Range   Source Wet Prep POC vaginal    WBC, Wet Prep HPF POC few    Bacteria Wet Prep HPF POC Few Few   BACTERIA WET PREP MORPHOLOGY POC     Clue Cells Wet Prep HPF POC Many (A) None   Clue Cells Wet Prep Whiff POC     Yeast Wet Prep HPF POC None None   KOH Wet Prep POC None None   Trichomonas Wet Prep HPF POC Absent Absent     has not eaten today  Drinking a little less water than usual   Has skin tags under belly  Would like to have some of her pannus removed   She is interested in plastic surgery office   Patient Active  Problem List   Diagnosis Date Noted  . Tongue lesion 10/26/2020  . ADD (attention deficit disorder) 07/30/2020  . Social anxiety disorder 07/30/2020  . Back pain 03/20/2020  . Attention and concentration deficit 12/28/2019  . Allergic rhinitis 12/11/2019  . 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 Thirst 10/16/2019  . Vitamin D deficiency 09/16/2019  . Mild reactive airways disease 03/20/2019  . Hot flashes 02/20/2019  . Constipation 02/20/2019  . Fatigue 02/20/2019  . Right knee pain 10/17/2018  . Dysuria 06/06/2018  . Benign paroxysmal positional vertigo 05/16/2018  . Postoperative state 01/03/2018  . Lipoma of back 10/04/2017  . Acute sinusitis 10/04/2017  . Routine general medical examination at a health care facility 08/29/2017  . Fibrocystic breast changes 01/08/2016  . Hemorrhoids, external 09/30/2014  . Chronic diarrhea 08/29/2014  . 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 Hemorrhoids, internal, with bleeding 04/22/2014  . Family history of pulmonary embolism 09/02/2013  . Family history of diabetes mellitus 09/02/2013  . Cutaneous skin tags 05/15/2013  . 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 Vaginal discharge 12/20/2010  .  Class 2 obesity due to excess calories without serious comorbidity with body mass index (BMI) of 37.0 to 37.9 in adult 03/31/2008  . HEARING LOSS, MILD 03/31/2008  . MIGRAINE HEADACHE 02/20/2007  . IBS 02/20/2007  . OSTEOARTHRITIS 02/20/2007   Past Medical History:  Diagnosis Date  . Anemia   . B12 deficiency   . Back pain   . Colon polyps   . Coronary artery abnormality    spasms   . 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 reports today has not had any spasms or chest pains in over 2 years    . Fibroids   . Gallstones   . Irritable bowel syndrome   . Joint pain   . Lactose intolerance   . Migraine     hasnt had one in a long time  . Obesity, unspecified   . Vitamin D deficiency    Past Surgical History:  Procedure Laterality Date  . CHOLECYSTECTOMY  05/06/11  . COLONOSCOPY  11/2001   polyp; negative pathology  . COLONOSCOPY  09/2004   Negative  . COLONOSCOPY  2017 or 2018 patient  unsure   [facility] ;   . COLONOSCOPY W/ POLYPECTOMY    . CYSTOSCOPY N/A 01/03/2018   Procedure: CYSTOSCOPY;  Surgeon: Bobbye Charleston, MD;  [facility];  Service: Gynecology;  Laterality: N/A;  . ESOPHAGOGASTRODUODENOSCOPY  1/10   normal  . FLEXIBLE SIGMOIDOSCOPY  03/05/2012   Procedure: FLEXIBLE SIGMOIDOSCOPY;  Surgeon: Inda Castle, MD;  [facility];  Service: Endoscopy;  Laterality: N/A;  . LAPAROSCOPIC DECORTICATION / DUBULKING / ABLATION RENAL CYSTS     saline histogram  . ROBOTIC ASSISTED LAPAROSCOPIC HYSTERECTOMY AND SALPINGECTOMY Bilateral 01/03/2018   Procedure: XI ROBOTIC ASSISTED LAPAROSCOPIC HYSTERECTOMY AND SALPINGECTOMY;  Surgeon: Bobbye Charleston, MD;  Location[facility];  Service: Gynecology;  Laterality: Bilateral;  WITH BED AFTER  . TONSILLECTOMY  07/30/07  . Uterine US  09/2005   Large endometrial stripe; small ovarian cyst   Social History   Tobacco Use  . Smoking status: Never Smoker  . Smokeless tobacco: Never Used  Vaping Use  . Vaping Use: Never used  Substance Use Topics  . Alcohol use: No    Alcohol/week: 0.0 standard drinks  . Drug use: No   Family History  Problem Relation Age of Onset  . Heart disease Father   . Hypertension Father   . Heart attack Father   . Diabetes Father   . Sleep apnea Mother   . Obesity Mother   . Ulcers Brother   . Clotting disorder Brother   . Ovarian cancer Maternal Grandmother   . Colon polyps Maternal Grandmother   . Colon cancer Maternal Grandmother        40's  . Diabetes Paternal Grandmother   . Hypertension Paternal Grandmother   . Breast cancer Maternal Aunt        Age 66's  . Diabetes Paternal Aunt         x 5  . Lung cancer Maternal Grandfather        SMOKER   Allergies  Allergen Reactions  . Prevacid [Lansoprazole] Rash   Current Outpatient Medications on File Prior to Visit  Medication Sig Dispense Refill  . albuterol (VENTOLIN HFA) 108 (90 Base) MCG/ACT inhaler Inhale 2 puffs into the lungs every 4 (four)  hours as needed for wheezing. 18 g 3  . amphetamine-dextroamphetamine (ADDERALL) 20 MG tablet Take one pill by mouth in am and one mid day 60 tablet 0  . Azelastine HCl 0.15 % SOLN PLACE 2 SPRAYS INTO THE NOSE DAILY AS NEEDED (ALLERGIES). IN EACH NOSTRIL 90 mL 1  . fluticasone (FLONASE) 50 MCG/ACT nasal spray PLACE 2 SPRAYS INTO BOTH NOSTRILS DAILY AS NEEDED (FOR ALLERGIES.). 48 mL 1  . hydrocortisone (ANUSOL-HC) 2.5 % rectal cream Place 1 application rectally 2 (two) times daily. 30 g 0  . hydrocortisone (ANUSOL-HC) 25 MG suppository Place 1 suppository (25 mg total) rectally 2 (two) times daily as needed for hemorrhoids or anal itching. 12 suppository 1  . ibuprofen (ADVIL) 600 MG tablet Take 1 tablet (600 mg total) by mouth every 8 (eight) hours as needed for fever, headache, mild pain or moderate pain. With food 30 tablet 0  . levocetirizine (XYZAL) 5 MG tablet Take 1 tablet (5 mg total) by mouth every evening. 90 tablet 3  . montelukast (SINGULAIR) 10 MG tablet TAKE 1 TABLET BY MOUTH EVERYDAY AT BEDTIME 90 tablet 1  . polyethylene glycol powder (GLYCOLAX/MIRALAX) 17 GM/SCOOP powder Take 17 g by mouth daily. 3350 g 0  . triamcinolone (KENALOG) 0.1 % paste Use as directed 1 application in the mouth or throat 2 (two) times daily. To affected area 5 g 1  . triamcinolone (KENALOG) 0.1 % Apply 1 application topically 2 (two) times daily as needed (for Eczema). 30 g 3   No current facility-administered medications on file prior to visit.     Review of Systems  Constitutional: Negative for activity change, appetite change, fatigue, fever and unexpected weight change.  HENT: Negative for  congestion, ear pain, rhinorrhea, sinus pressure and sore throat.   Eyes: Negative for pain, redness and visual disturbance.  Respiratory: Negative for cough, shortness of breath and wheezing.   Cardiovascular: Negative for chest pain and palpitations.  Gastrointestinal: Negative for abdominal pain, blood in stool, constipation and diarrhea.  Endocrine: Negative for polydipsia and polyuria.  Genitourinary: Negative for dysuria, frequency and urgency.  Musculoskeletal: Negative for arthralgias, back pain and myalgias.  Skin: Negative for pallor and rash.       Skin tags   Allergic/Immunologic: Negative for environmental allergies.  Neurological: Negative for dizziness, syncope and headaches.  Hematological: Negative for adenopathy. Does not bruise/bleed easily.  Psychiatric/Behavioral: Negative for decreased concentration and dysphoric mood. The patient is not nervous/anxious.        Objective:   Physical Exam Constitutional:      General: She is not in acute distress.    Appearance: Normal appearance. She is obese. She is not ill-appearing.  Eyes:     General: No scleral icterus.    Conjunctiva/sclera: Conjunctivae normal.     Pupils: Pupils are equal, round, and reactive to light.  Cardiovascular:     Rate and Rhythm: Regular rhythm. Bradycardia present.     Heart sounds: Normal heart sounds.  Pulmonary:     Effort: Pulmonary effort is normal. No respiratory distress.     Breath sounds: Normal breath sounds.  Abdominal:     General: Abdomen is flat. Bowel sounds are normal. There is no distension.     Palpations: There is no mass.     Tenderness: There is no abdominal tenderness. There is no guarding or rebound.     Hernia: No hernia is present.     Comments: Moderate sized pannus   Musculoskeletal:  Cervical back: Normal range of motion and neck supple. No tenderness.  Lymphadenopathy:     Cervical: No cervical adenopathy.  Skin:    General: Skin is warm and dry.      Findings: No erythema or rash.     Comments: Small skin tags and SKs under pannus, brown in color   Neurological:     Mental Status: She is alert.     Coordination: Coordination normal.  Psychiatric:        Mood and Affect: Mood normal.           Assessment & Plan:   Problem List Items Addressed This Visit      Musculoskeletal and Integument   Cutaneous skin tags    Pt has skin tags and small brown SKs under pannus  Would like them treated/removed  Will check to see if insurance covers this or if she can afford it         Other   Class 2 obesity due to excess calories without serious comorbidity with body mass index (BMI) of 37.0 to 37.9 in adult    Commended on 55 lb wt loss so far with diet and exercise  Pt would like to consider surgery to reduce size of her pannus  I recommended she wait until closer to her target wt (under 200 lb)  Then plastic surgery referral (aware would likely be out of pocket)      Vaginal discharge - Primary    With some dysuria/discomfort  Declines STD screen Clue cells seen on wt prep  tx for BV with metro gel vaginal and handout given   Meds ordered this encounter  Medications  . metroNIDAZOLE (METROGEL VAGINAL) 0.75 % vaginal gel    Sig: Place 1 Applicatorful vaginally 2 (two) times daily.    Dispense:  70 g    Refill:  0         Relevant Orders   POCT Wet Prep [facility]) (Completed)   Dysuria    Concentrated urine Enc to drink more water Clue cells on wet prep- treated with metro gel vaginal      Relevant Orders   POCT Urinalysis Dipstick (Automated) (Completed)

## 2020-11-26 NOTE — Assessment & Plan Note (Signed)
Pt has skin tags and small brown SKs under pannus  Would like them treated/removed  Will check to see if insurance covers this or if she can afford it

## 2020-11-26 NOTE — Assessment & Plan Note (Signed)
Commended on 55 lb wt loss so far with diet and exercise  Pt would like to consider surgery to reduce size of her pannus  I recommended she wait until closer to her target wt (under 200 lb)  Then plastic surgery referral (aware would likely be out of pocket)

## 2020-11-26 NOTE — Patient Instructions (Signed)
Keep working on water intake   Use the metro gel vaginal as directed for 5 days  If any problems let us know   You have some flat skin tags under pannus  If affordable we can treat those   For plastic surgeon I like Dr Dessie Coma ([place])   Optimal to wait until closer to goal weight for any surgical consideration   Great job with weight loss

## 2020-12-03 ENCOUNTER — Encounter: Payer: Self-pay | Admitting: Family Medicine

## 2020-12-03 ENCOUNTER — Other Ambulatory Visit: Payer: Self-pay | Admitting: Family Medicine

## 2020-12-04 MED ORDER — METRONIDAZOLE 500 MG PO TABS: 500.0000 mg | ORAL_TABLET | Freq: Two times a day (BID) | ORAL | 0 refills | Status: AC

## 2020-12-04 NOTE — Telephone Encounter (Signed)
She wanted to try the pill instead of the gel and I sent it in

## 2020-12-04 NOTE — Telephone Encounter (Signed)
Patient also sent a MyChart message in regards to this.

## 2020-12-11 ENCOUNTER — Ambulatory Visit
Admission: EM | Admit: 2020-12-11 | Discharge: 2020-12-11 | Disposition: A | Discharge status: Home / Self Care | Payer: BC Managed Care – PPO

## 2020-12-11 ENCOUNTER — Other Ambulatory Visit: Payer: Self-pay

## 2020-12-11 ENCOUNTER — Other Ambulatory Visit: Payer: Self-pay | Admitting: Family Medicine

## 2020-12-11 MED ORDER — AMPHETAMINE-DEXTROAMPHETAMINE 20 MG PO TABS: ORAL_TABLET | ORAL | 0 refills | Status: DC

## 2020-12-11 NOTE — Telephone Encounter (Signed)
Last office visit 11/26/2020 for skin tags,  vaginal discharge, dysuria.  Last refilled 11/09/2020 for #60 with no refills.  No future appointments.

## 2020-12-27 ENCOUNTER — Encounter: Payer: Self-pay | Admitting: Family Medicine

## 2020-12-27 DIAGNOSIS — K644 Residual hemorrhoidal skin tags: Secondary | ICD-10-CM

## 2020-12-27 DIAGNOSIS — K648 Other hemorrhoids: Secondary | ICD-10-CM

## 2020-12-28 MED ORDER — HYDROCORTISONE (PERIANAL) 2.5 % EX CREA: 1.0000 "application " | TOPICAL_CREAM | Freq: Two times a day (BID) | CUTANEOUS | 0 refills | Status: DC

## 2020-12-28 NOTE — Addendum Note (Signed)
Addended by: Loura Pardon A on: 12/28/2020 08:21 PM   Modules accepted: Orders

## 2021-01-04 ENCOUNTER — Encounter: Payer: Self-pay | Admitting: Gastroenterology

## 2021-02-11 ENCOUNTER — Other Ambulatory Visit: Payer: Self-pay | Admitting: Family Medicine

## 2021-02-16 MED ORDER — AMPHETAMINE-DEXTROAMPHETAMINE 20 MG PO TABS: ORAL_TABLET | ORAL | 0 refills | Status: DC

## 2021-02-16 NOTE — Telephone Encounter (Signed)
Name of Medication: Adderall 20 mg Name of Pharmacy: [facility] [address] Henrietta Dine or Written Date and Quantity: 12/11/20 #60 tabs/0 refill  Last Office Visit and Type: discuss skin tag/ skin problems  Next Office Visit and Type: none scheduled

## 2021-04-09 ENCOUNTER — Other Ambulatory Visit: Payer: Self-pay | Admitting: Family Medicine

## 2021-04-09 MED ORDER — AMPHETAMINE-DEXTROAMPHETAMINE 20 MG PO TABS: ORAL_TABLET | ORAL | 0 refills | Status: DC

## 2021-04-09 NOTE — Telephone Encounter (Signed)
Name of Medication: Adderall 20 mg Name of Pharmacy: [facility] [address] Dine or Written Date and Quantity: 02/16/21 #60 tabs/0 refill  Last Office Visit and Type: discuss skin tag/ skin problems 11/26/20 Next Office Visit and Type: none scheduled

## 2021-04-25 ENCOUNTER — Other Ambulatory Visit: Payer: Self-pay

## 2021-04-25 ENCOUNTER — Ambulatory Visit
Admission: RE | Admit: 2021-04-25 | Discharge: 2021-04-25 | Disposition: A | Discharge status: Home / Self Care | Payer: BC Managed Care – PPO | Source: Ambulatory Visit | Attending: Emergency Medicine | Admitting: Emergency Medicine

## 2021-04-25 VITALS — BP 114/78 | HR 74 | Temp 98.8°F | Resp 20

## 2021-04-25 DIAGNOSIS — B349 Viral infection, unspecified: Secondary | ICD-10-CM

## 2021-04-25 DIAGNOSIS — Z1152 Encounter for screening for COVID-19: Secondary | ICD-10-CM | POA: Diagnosis not present

## 2021-04-25 NOTE — ED Triage Notes (Signed)
Pt here with ear fullness, eye drainage, and sore throat since yesterday.

## 2021-04-25 NOTE — Discharge Instructions (Addendum)
Your COVID test is pending.  You should self quarantine until the test result is back.    Take Tylenol or ibuprofen as needed for fever or discomfort.  Rest and keep yourself hydrated.    Follow-up with your primary care provider if your symptoms are not improving.     

## 2021-04-25 NOTE — ED Provider Notes (Signed)
[facility]    CSN: [id number] Arrival date & time: 04/25/21  1200      History   Chief Complaint Chief Complaint  Patient presents with   Sore Throat   Otalgia   Eye Problem    HPI Brittney [name] is a 40 y.o. female.  Patient presents with clear eye drainage, ear fullness, and sore throat since yesterday.  She denies fever, rash, cough, shortness of breath, or other symptoms.  No treatments attempted at home.  Her medical history includes coronary artery spasm, obesity, migraine headaches, reactive airway disease, back pain, IBS.  The history is provided by the patient and medical records.   Past Medical History:  Diagnosis Date   Anemia    B12 deficiency    Back pain    Colon polyps    Coronary artery abnormality    spasms    Coronary artery spasm [facility])    reports saw cardiologist for chest pains , was told she was having coronary artery spasms , sent for cardio w/u with stress and echo  both unremarkable. reports today has not had any spasms or chest pains in over 2 years     Fibroids    Gallstones    Irritable bowel syndrome    Joint pain    Lactose intolerance    Migraine    hasnt had one in a long time   Obesity, unspecified    Vitamin D deficiency     Patient Active Problem List   Diagnosis Date Noted   Tongue lesion 10/26/2020   ADD (attention deficit disorder) 07/30/2020   Social anxiety disorder 07/30/2020   Back pain 03/20/2020   Attention and concentration deficit 12/28/2019   Allergic rhinitis 12/11/2019   Ketosis ([place]) 10/16/2019   Thirst 10/16/2019   Vitamin D deficiency 09/16/2019   Mild reactive airways disease 03/20/2019   Hot flashes 02/20/2019   Constipation 02/20/2019   Fatigue 02/20/2019   Right knee pain 10/17/2018   Dysuria 06/06/2018   Benign paroxysmal positional vertigo 05/16/2018   Postoperative state 01/03/2018   Lipoma of back 10/04/2017   Routine general medical examination at a health care facility  08/29/2017   Fibrocystic breast changes 01/08/2016   Hemorrhoids, external 09/30/2014   Chronic diarrhea 08/29/2014   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[place]) 07/04/2014   Hemorrhoids, internal, with bleeding 04/22/2014   Family history of pulmonary embolism 09/02/2013   Family history of diabetes mellitus 09/02/2013   Cutaneous skin tags 05/15/2013   Sclerosis, ilium, 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   Vaginal discharge 12/20/2010   Class 2 obesity due to excess calories without serious comorbidity with body mass index (BMI) of 37.0 to 37.9 in adult 03/31/2008   HEARING LOSS, MILD 03/31/2008   MIGRAINE HEADACHE 02/20/2007   IBS 02/20/2007   OSTEOARTHRITIS 02/20/2007    Past Surgical History:  Procedure Laterality Date   CHOLECYSTECTOMY  05/06/11   COLONOSCOPY  11/2001   polyp; negative pathology   COLONOSCOPY  09/2004   Negative   COLONOSCOPY  2017 or 2018 patient  unsure   [facility] ;    COLONOSCOPY W/ POLYPECTOMY     CYSTOSCOPY N/A 01/03/2018   Procedure: CYSTOSCOPY;  Surgeon: Bobbye Charleston, MD;  Location: [facility];  Service: Gynecology;  Laterality: N/A;   ESOPHAGOGASTRODUODENOSCOPY  1/10   normal   FLEXIBLE SIGMOIDOSCOPY  03/05/2012   Procedure: FLEXIBLE SIGMOIDOSCOPY;  Surgeon: Inda Castle, MD;  Location: [facility];  Service: Endoscopy;  Laterality: N/A;   LAPAROSCOPIC DECORTICATION /  [place] / ABLATION RENAL CYSTS     saline histogram   ROBOTIC ASSISTED LAPAROSCOPIC HYSTERECTOMY AND SALPINGECTOMY Bilateral 01/03/2018   Procedure: XI ROBOTIC ASSISTED LAPAROSCOPIC HYSTERECTOMY AND SALPINGECTOMY;  Surgeon: Bobbye Charleston, MD;  Location: [facility];  Service: Gynecology;  Laterality: Bilateral;  WITH BED AFTER   TONSILLECTOMY  07/30/07   Uterine US  09/2005   Large endometrial stripe; small ovarian cyst    OB History     Gravida  2   Para  2   Term  2   Preterm  0   AB  0   Living  2      SAB  0   IAB  0   Ectopic  0   Multiple  0   Live Births  1             Home Medications    Prior to Admission medications   Medication Sig Start Date End Date Taking? Authorizing Provider  albuterol (VENTOLIN HFA) 108 (90 Base) MCG/ACT inhaler Inhale 2 puffs into the lungs every 4 (four) hours as needed for wheezing. 12/11/19   Tower, Wynelle Fanny, MD  amphetamine-dextroamphetamine (ADDERALL) 20 MG tablet Take one pill by mouth in am and one mid day 04/09/21   Tower, Roque Lias A, MD  Azelastine HCl 0.15 % SOLN PLACE 2 SPRAYS INTO THE NOSE DAILY AS NEEDED (ALLERGIES). IN [facility] NOSTRIL 03/04/20   Tower, Roque Lias A, MD  fluticasone (FLONASE) 50 MCG/ACT nasal spray PLACE 2 SPRAYS INTO BOTH NOSTRILS DAILY AS NEEDED (FOR ALLERGIES.). 09/30/20   Tower, Wynelle Fanny, MD  hydrocortisone (ANUSOL-HC) 2.5 % rectal cream Place 1 application rectally 2 (two) times daily. 10/14/20   Tower, Wynelle Fanny, MD  hydrocortisone (ANUSOL-HC) 2.5 % rectal cream Apply 1 application topically 2 (two) times daily. To hemorrhoid areas 12/28/20   Tower, Wynelle Fanny, MD  hydrocortisone (ANUSOL-HC) 25 MG suppository Place 1 suppository (25 mg total) rectally 2 (two) times daily as needed for hemorrhoids or anal itching. 05/28/19   Tower, Wynelle Fanny, MD  ibuprofen (ADVIL) 600 MG tablet Take 1 tablet (600 mg total) by mouth every 8 (eight) hours as needed for fever, headache, mild pain or moderate pain. With food 08/20/20   Loura Halt A, NP  levocetirizine (XYZAL) 5 MG tablet Take 1 tablet (5 mg total) by mouth every evening. 09/24/19   Tower, Wynelle Fanny, MD  montelukast (SINGULAIR) 10 MG tablet TAKE 1 TABLET BY MOUTH EVERYDAY AT BEDTIME 10/21/20   Tower, Odin A, MD  polyethylene glycol powder (GLYCOLAX/MIRALAX) 17 GM/SCOOP powder Take 17 g by mouth daily. 07/25/19   Jearld Lesch A, DO  triamcinolone (KENALOG) 0.1 % paste Use as directed 1 application in the mouth or throat 2 (two) times daily. To affected area 10/26/20   Tower, Wynelle Fanny, MD  triamcinolone (KENALOG) 0.1 % Apply 1 application topically 2 (two) times daily as needed (for  Eczema). 10/26/20   Tower, Wynelle Fanny, MD    Family History Family History  Problem Relation Age of Onset   Heart disease Father    Hypertension Father    Heart attack Father    Diabetes Father    Sleep apnea Mother    Obesity Mother    Ulcers Brother    Clotting disorder Brother    Ovarian cancer Maternal Grandmother    Colon polyps Maternal Grandmother    Colon cancer Maternal Grandmother        40's   Diabetes Paternal Grandmother  Hypertension Paternal Grandmother    Breast cancer Maternal Aunt        Age 60's   Diabetes Paternal Aunt        x 5   Lung cancer Maternal Grandfather        SMOKER    Social History Social History   Tobacco Use   Smoking status: Never   Smokeless tobacco: Never  Vaping Use   Vaping Use: Never used  Substance Use Topics   Alcohol use: No    Alcohol/week: 0.0 standard drinks   Drug use: No     Allergies   Prevacid [lansoprazole]   Review of Systems Review of Systems  Constitutional:  Negative for chills and fever.  HENT:  Positive for ear pain and sore throat.   Eyes:  Negative for pain and visual disturbance.  Respiratory:  Negative for cough and shortness of breath.   Cardiovascular:  Negative for chest pain and palpitations.  Gastrointestinal:  Negative for abdominal pain, diarrhea and vomiting.  Skin:  Negative for color change and rash.  All other systems reviewed and are negative.   Physical Exam Triage Vital Signs ED Triage Vitals  Enc Vitals Group     BP      Pulse      Resp      Temp      Temp src      SpO2      Weight      Height      Head Circumference      Peak Flow      Pain Score      Pain Loc      Pain Edu?      Excl. in [place]?    No data found.  Updated Vital Signs BP 114/78   Pulse 74   Temp 98.8 F (37.1 C)   Resp 20   LMP 12/21/2017   SpO2 100%   Visual Acuity Right Eye Distance:   Left Eye Distance:   Bilateral Distance:    Right Eye Near:   Left Eye Near:    Bilateral Near:      Physical Exam Vitals and nursing note reviewed.  Constitutional:      General: She is not in acute distress.    Appearance: She is well-developed. She is not ill-appearing.  HENT:     Head: Normocephalic and atraumatic.     Right Ear: Tympanic membrane and ear canal normal.     Left Ear: Tympanic membrane and ear canal normal.     Nose: Nose normal.     Mouth/Throat:     Mouth: Mucous membranes are moist.     Pharynx: Oropharynx is clear.  Eyes:     Extraocular Movements: Extraocular movements intact.     Conjunctiva/sclera: Conjunctivae normal.     Pupils: Pupils are equal, round, and reactive to light.  Cardiovascular:     Rate and Rhythm: Normal rate and regular rhythm.     Heart sounds: Normal heart sounds.  Pulmonary:     Effort: Pulmonary effort is normal. No respiratory distress.     Breath sounds: Normal breath sounds.  Abdominal:     Palpations: Abdomen is soft.     Tenderness: There is no abdominal tenderness.  Musculoskeletal:     Cervical back: Neck supple.  Skin:    General: Skin is warm and dry.  Neurological:     General: No focal deficit present.     Mental Status: She is alert  and oriented to person, place, and time.     Gait: Gait normal.  Psychiatric:        Mood and Affect: Mood normal.        Behavior: Behavior normal.     UC Treatments / Results  Labs (all labs ordered are listed, but only abnormal results are displayed) Labs Reviewed  NOVEL CORONAVIRUS, NAA    EKG   Radiology No results found.  Procedures Procedures (including critical care time)  Medications Ordered in UC Medications - No data to display  Initial Impression / Assessment and Plan / UC Course  I have reviewed the triage vital signs and the nursing notes.  Pertinent labs & imaging results that were available during my care of the patient were reviewed by me and considered in my medical decision making (see chart for details).   Viral illness.  Patient is  well-appearing and her exam is reassuring.  COVID pending.  Instructed patient to self quarantine per CDC guidelines.  Discussed symptomatic treatment including Tylenol or ibuprofen, rest, hydration.  Instructed patient to follow up with PCP if symptoms are not improving.  Patient agrees to plan of care.    Final Clinical Impressions(s) / UC Diagnoses   Final diagnoses:  Encounter for screening for COVID-19  Viral illness     Discharge Instructions      Your COVID test is pending.  You should self quarantine until the test result is back.    Take Tylenol or ibuprofen as needed for fever or discomfort.  Rest and keep yourself hydrated.    Follow-up with your primary care provider if your symptoms are not improving.         ED Prescriptions   None    PDMP not reviewed this encounter.   Sharion Balloon, NP 04/25/21 1231

## 2021-04-26 LAB — NOVEL CORONAVIRUS, NAA: SARS-CoV-2, NAA: NOT DETECTED

## 2021-04-26 LAB — SARS-COV-2, NAA 2 DAY TAT

## 2021-06-04 ENCOUNTER — Other Ambulatory Visit: Payer: Self-pay | Admitting: Family Medicine

## 2021-06-04 MED ORDER — AMPHETAMINE-DEXTROAMPHETAMINE 20 MG PO TABS: ORAL_TABLET | ORAL | 0 refills | Status: DC

## 2021-06-04 NOTE — Telephone Encounter (Signed)
Name of Medication: Adderall 20 mg Name of [facility] [address] Dine or Written Date and Quantity: 04/09/21 #60 tabs/0 refill  Last Office Visit and Type: discuss skin tag/ skin problems 11/26/20 Next Office Visit and Type: none scheduled

## 2021-06-09 ENCOUNTER — Encounter: Payer: Self-pay | Admitting: Family Medicine

## 2021-06-30 ENCOUNTER — Ambulatory Visit: Payer: BC Managed Care – PPO | Admitting: Family Medicine

## 2021-06-30 ENCOUNTER — Encounter: Payer: Self-pay | Admitting: Family Medicine

## 2021-06-30 ENCOUNTER — Other Ambulatory Visit: Payer: Self-pay

## 2021-06-30 VITALS — BP 110/66 | HR 71 | Temp 97.6°F | Ht 67.0 in | Wt 238.0 lb

## 2021-06-30 DIAGNOSIS — N3281 Overactive bladder: Secondary | ICD-10-CM

## 2021-06-30 DIAGNOSIS — N3946 Mixed incontinence: Secondary | ICD-10-CM | POA: Diagnosis not present

## 2021-06-30 DIAGNOSIS — R3915 Urgency of urination: Secondary | ICD-10-CM

## 2021-06-30 LAB — POC URINALSYSI DIPSTICK (AUTOMATED)
Bilirubin, UA: NEGATIVE
Blood, UA: NEGATIVE
Glucose, UA: NEGATIVE
Ketones, UA: NEGATIVE
Leukocytes, UA: NEGATIVE
Protein, UA: NEGATIVE
Spec Grav, UA: 1.02 (ref 1.010–1.025)
Urobilinogen, UA: 0.2 E.U./dL
pH, UA: 7 (ref 5.0–8.0)

## 2021-06-30 MED ORDER — SOLIFENACIN SUCCINATE 10 MG PO TABS: 10.0000 mg | ORAL_TABLET | Freq: Every day | ORAL | 3 refills | Status: DC

## 2021-06-30 NOTE — Patient Instructions (Signed)
Try the kegel exercises   Try vesicare one pill daily  If intolerable side effect stop and let us know   Keep drinking water   Let us know if still struggling in about a month  We would consider a urology consult

## 2021-06-30 NOTE — Assessment & Plan Note (Signed)
For stress incontinence discussed kegel exercises  Wt loss may help also  No doubt hysterectomy added to this  For urge incontinence- trial of vesicare 10 mg daily  Disc poss side effect  Given handouts  inst to update Also watch for s/s of uti

## 2021-06-30 NOTE — Assessment & Plan Note (Signed)
Urinary frequency/urgency  Now some incontinence  Good water intake Clear ua  Discussed trial of vesicare 10 mg and update  Consider urol ref if no imp

## 2021-06-30 NOTE — Progress Notes (Signed)
Subjective:    Patient ID: Brittney [name], female    DOB: Aug 16, 1980, 40 y.o.   MRN: [id number]  This visit occurred during the SARS-CoV-2 public health emergency.  Safety protocols were in place, including screening questions prior to the visit, additional usage of staff PPE, and extensive cleaning of exam room while observing appropriate contact time as indicated for disinfecting solutions.   HPI Pt presents for urinary c/o  Wt Readings from Last 3 Encounters:  06/30/21 238 lb (108 kg)  11/26/20 238 lb 9 oz (108.2 kg)  10/26/20 243 lb 8 oz (110.5 kg)   37.28 kg/m   Issues with urgent urination  1-2 months ago  Cannot hold it - has had incontinence Also sometimes with straining  No burning to urinate No blood   ? If she empties all the way  Some frequency   Gets up twice at night to urinate   No vaginal itch or other symptoms   Sometimes urine smells kind of sweet / change in smell   Drinks lots of water   Had a hysterectomy  Worse after that   Ua:  Results for orders placed or performed in visit on 06/30/21  POCT Urinalysis Dipstick (Automated)  Result Value Ref Range   Color, UA Yellow    Clarity, UA Clear    Glucose, UA Negative Negative   Bilirubin, UA Negative    Ketones, UA Negative    Spec Grav, UA 1.020 1.010 - 1.025   Blood, UA Negative    pH, UA 7.0 5.0 - 8.0   Protein, UA Negative Negative   Urobilinogen, UA 0.2 0.2 or 1.0 E.U./dL   Nitrite, UA Neative    Leukocytes, UA Negative Negative      Review of Systems  Constitutional:  Negative for activity change, appetite change, fatigue, fever and unexpected weight change.  HENT:  Negative for congestion, ear pain, rhinorrhea, sinus pressure and sore throat.   Eyes:  Negative for pain, redness and visual disturbance.  Respiratory:  Negative for cough, shortness of breath and wheezing.   Cardiovascular:  Negative for chest pain and palpitations.  Gastrointestinal:  Negative for abdominal  pain, blood in stool, constipation and diarrhea.  Endocrine: Negative for polydipsia and polyuria.  Genitourinary:  Positive for frequency and urgency. Negative for decreased urine volume, dysuria, hematuria, pelvic pain, vaginal discharge and vaginal pain.  Musculoskeletal:  Negative for arthralgias, back pain and myalgias.  Skin:  Negative for pallor and rash.  Allergic/Immunologic: Negative for environmental allergies.  Neurological:  Negative for dizziness, syncope and headaches.  Hematological:  Negative for adenopathy. Does not bruise/bleed easily.  Psychiatric/Behavioral:  Negative for decreased concentration and dysphoric mood. The patient is not nervous/anxious.       Objective:   Physical Exam Constitutional:      General: She is not in acute distress.    Appearance: Normal appearance. She is obese. She is not ill-appearing.  HENT:     Mouth/Throat:     Mouth: Mucous membranes are moist.  Eyes:     Conjunctiva/sclera: Conjunctivae normal.     Pupils: Pupils are equal, round, and reactive to light.  Cardiovascular:     Rate and Rhythm: Normal rate and regular rhythm.     Heart sounds: Normal heart sounds.  Pulmonary:     Effort: Pulmonary effort is normal. No respiratory distress.     Breath sounds: Normal breath sounds. No wheezing.  Abdominal:     Tenderness: There is no right  CVA tenderness or left CVA tenderness.     Comments: No suprapubic tenderness or fullness    Musculoskeletal:     Right lower leg: No edema.     Left lower leg: No edema.  Skin:    Findings: No rash.  Neurological:     Mental Status: She is alert.  Psychiatric:        Mood and Affect: Mood normal.          Assessment & Plan:   Problem List Items Addressed This Visit       Genitourinary   Overactive bladder    Urinary frequency/urgency  Now some incontinence  Good water intake Clear ua  Discussed trial of vesicare 10 mg and update  Consider urol ref if no imp        Other    Mixed incontinence - Primary    For stress incontinence discussed kegel exercises  Wt loss may help also  No doubt hysterectomy added to this  For urge incontinence- trial of vesicare 10 mg daily  Disc poss side effect  Given handouts  inst to update Also watch for s/s of uti         Other Visit Diagnoses     Urinary urgency       Relevant Orders   POCT Urinalysis Dipstick (Automated) (Completed)

## 2021-07-02 ENCOUNTER — Other Ambulatory Visit: Payer: Self-pay | Admitting: Family Medicine

## 2021-07-05 MED ORDER — AMPHETAMINE-DEXTROAMPHETAMINE 20 MG PO TABS: ORAL_TABLET | ORAL | 0 refills | Status: DC

## 2021-07-05 NOTE — Telephone Encounter (Signed)
Name of Medication: Adderall 20 mg Name of [facility] [address] Dine or Written Date and Quantity: 06/04/21 #60 tabs/0 refill  Last Office Visit and Type: Bladder issues on 06/30/21 Next Office Visit and Type: none scheduled

## 2021-07-21 ENCOUNTER — Ambulatory Visit: Payer: BC Managed Care – PPO | Admitting: Family Medicine

## 2021-07-21 ENCOUNTER — Encounter: Payer: Self-pay | Admitting: Family Medicine

## 2021-07-21 MED ORDER — FLUCONAZOLE 150 MG PO TABS: 150.0000 mg | ORAL_TABLET | Freq: Once | ORAL | 0 refills | Status: AC

## 2021-07-21 NOTE — Telephone Encounter (Signed)
See mychart. Appt notes said she thought she had a yeast inf, ? UTI and hemorrhoids and when she asked it to be virtual (see mychart from 07/19/21) they did advise her given sxs it needs to be an in office appt but I'm not sure if pt saw that message in time.

## 2021-08-07 ENCOUNTER — Other Ambulatory Visit: Payer: Self-pay | Admitting: Family Medicine

## 2021-08-09 MED ORDER — AMPHETAMINE-DEXTROAMPHETAMINE 20 MG PO TABS: ORAL_TABLET | ORAL | 0 refills | Status: DC

## 2021-08-09 NOTE — Telephone Encounter (Signed)
Refill request for Adderall 20 mg tablets  LOV - 06/30/21 Next OV - not scheduled Last refill - 07/05/21 #60/0

## 2021-08-23 ENCOUNTER — Other Ambulatory Visit: Payer: Self-pay | Admitting: Family Medicine

## 2021-08-23 DIAGNOSIS — Z1231 Encounter for screening mammogram for malignant neoplasm of breast: Secondary | ICD-10-CM

## 2021-08-27 ENCOUNTER — Telehealth: Payer: Self-pay | Admitting: Family Medicine

## 2021-08-27 NOTE — Telephone Encounter (Signed)
Mychart sent to pt telling her that labs would be ordered after talking to Dr. Glori Bickers at her appt

## 2021-08-27 NOTE — Telephone Encounter (Signed)
Akasha D Gilliam-Wilson  You 27 minutes ago (9:29 AM)   You can also call my cell at [phone].    Nikola D Gilliam-Wilson  You 27 minutes ago (9:28 AM)   OK!  Either way, I have an appointment scheduled for Monday 1/16 at 9:15 with her.    You  Sunaina D Gilliam-Wilson 29 minutes ago (9:26 AM)   DB Im going to have to ask Dr. Golda Acre D Gilliam-Wilson  You 48 minutes ago (9:08 AM)   Is it possible to have labs done in [place], like today, and then have a virtual visit with Dr. Glori Bickers next week?    You  Ariadne D Gilliam-Wilson 1 hour ago (8:47 AM)   DB We would like to remind you that Dr. Glori Bickers is currently seeing patients at [facility] due to renovations being completed at the [facility] office [facility] location.     [facility] [place], [place], [place] [address]     Lab appointments for physicals will be completed on the same date as your physical.    We are truly sorry for this inconvenience and, if this is too far of a drive, we are happy to change the appointment  to a virtual as we are able. Unfortunately, physicals are not able to be completed virtually. Please continue to call the main number [phone]) or send a mychart message with any questions or concerns.     Mychart, Generic  Sheretta D Gilliam-Wilson 15 hours ago (6:38 PM)   GM Appointment Information:     Visit Type: Office Visit         Date: 08/30/2021                 Dept: [facility]                 Provider: Loura Pardon                 Time: 9:30 AM                 Length: 30 min   Appt Status: Scheduled     Appt Instructions:    Please arrive 15 minutes prior to your appointment. This will allow [place] to  verify and update your medical record and ensure a full appointment for you within the time allotted.    This MyChart message has not been read.   [name]  Patient Appointment  Schedule Request Pool 15 hours ago (6:38 PM)   Appointment For: [name] ([id number]) Visit Type: OFFICE VISIT (1004)   08/30/2021    9:30 AM  30 mins.  Tower, Wynelle Fanny, MD        [facility]   Patient Comments: I'd like to have lab work done.  I've been low on energy and I'm gaining weight again.

## 2021-08-27 NOTE — Telephone Encounter (Signed)
I need to talk to her first to determine what labs are needed

## 2021-08-30 ENCOUNTER — Other Ambulatory Visit: Payer: Self-pay

## 2021-08-30 ENCOUNTER — Ambulatory Visit (INDEPENDENT_AMBULATORY_CARE_PROVIDER_SITE_OTHER): Payer: BC Managed Care – PPO | Admitting: Family Medicine

## 2021-08-30 ENCOUNTER — Other Ambulatory Visit: Payer: BC Managed Care – PPO

## 2021-08-30 ENCOUNTER — Encounter: Payer: Self-pay | Admitting: Family Medicine

## 2021-08-30 VITALS — BP 110/68 | HR 75 | Temp 97.6°F | Ht 67.0 in | Wt 242.0 lb

## 2021-08-30 DIAGNOSIS — R5382 Chronic fatigue, unspecified: Secondary | ICD-10-CM | POA: Diagnosis not present

## 2021-08-30 DIAGNOSIS — Z13220 Encounter for screening for lipoid disorders: Secondary | ICD-10-CM | POA: Diagnosis not present

## 2021-08-30 DIAGNOSIS — Z23 Encounter for immunization: Secondary | ICD-10-CM

## 2021-08-30 DIAGNOSIS — E559 Vitamin D deficiency, unspecified: Secondary | ICD-10-CM | POA: Diagnosis not present

## 2021-08-30 DIAGNOSIS — Z131 Encounter for screening for diabetes mellitus: Secondary | ICD-10-CM

## 2021-08-30 DIAGNOSIS — F900 Attention-deficit hyperactivity disorder, predominantly inattentive type: Secondary | ICD-10-CM

## 2021-08-30 DIAGNOSIS — R7309 Other abnormal glucose: Secondary | ICD-10-CM

## 2021-08-30 DIAGNOSIS — R232 Flushing: Secondary | ICD-10-CM | POA: Diagnosis not present

## 2021-08-30 LAB — CBC WITH DIFFERENTIAL/PLATELET
Basophils Absolute: 0 10*3/uL (ref 0.0–0.1)
Basophils Relative: 0.4 % (ref 0.0–3.0)
Eosinophils Absolute: 0 10*3/uL (ref 0.0–0.7)
Eosinophils Relative: 0.7 % (ref 0.0–5.0)
HCT: 43.4 % (ref 36.0–46.0)
Hemoglobin: 13.8 g/dL (ref 12.0–15.0)
Lymphocytes Relative: 23.5 % (ref 12.0–46.0)
Lymphs Abs: 1.6 10*3/uL (ref 0.7–4.0)
MCHC: 31.7 g/dL (ref 30.0–36.0)
MCV: 84.9 fl (ref 78.0–100.0)
Monocytes Absolute: 0.5 10*3/uL (ref 0.1–1.0)
Monocytes Relative: 7.4 % (ref 3.0–12.0)
Neutro Abs: 4.5 10*3/uL (ref 1.4–7.7)
Neutrophils Relative %: 68 % (ref 43.0–77.0)
Platelets: 267 10*3/uL (ref 150.0–400.0)
RBC: 5.11 Mil/uL (ref 3.87–5.11)
RDW: 15.6 % — ABNORMAL HIGH (ref 11.5–15.5)
WBC: 6.7 10*3/uL (ref 4.0–10.5)

## 2021-08-30 LAB — COMPREHENSIVE METABOLIC PANEL
ALT: 9 U/L (ref 0–35)
AST: 14 U/L (ref 0–37)
Albumin: 4.3 g/dL (ref 3.5–5.2)
Alkaline Phosphatase: 60 U/L (ref 39–117)
BUN: 14 mg/dL (ref 6–23)
CO2: 25 mEq/L (ref 19–32)
Calcium: 9.4 mg/dL (ref 8.4–10.5)
Chloride: 106 mEq/L (ref 96–112)
Creatinine, Ser: 1.04 mg/dL (ref 0.40–1.20)
GFR: 66.32 mL/min (ref >60.00)
Glucose, Bld: 82 mg/dL (ref 70–99)
Potassium: 4.7 mEq/L (ref 3.5–5.1)
Sodium: 139 mEq/L (ref 135–145)
Total Bilirubin: 0.5 mg/dL (ref 0.2–1.2)
Total Protein: 7.2 g/dL (ref 6.0–8.3)

## 2021-08-30 LAB — TSH: TSH: 1.97 u[IU]/mL (ref 0.35–5.50)

## 2021-08-30 LAB — LIPID PANEL
Cholesterol: 180 mg/dL (ref 0–200)
HDL: 55.6 mg/dL (ref >39.00)
LDL Cholesterol: 116 mg/dL — ABNORMAL HIGH (ref 0–99)
NonHDL: 124.78
Total CHOL/HDL Ratio: 3
Triglycerides: 45 mg/dL (ref 0.0–149.0)
VLDL: 9 mg/dL (ref 0.0–40.0)

## 2021-08-30 LAB — T4, FREE: Free T4: 0.79 ng/dL (ref 0.60–1.60)

## 2021-08-30 LAB — VITAMIN D 25 HYDROXY (VIT D DEFICIENCY, FRACTURES): VITD: 29.9 ng/mL — ABNORMAL LOW (ref 30.00–100.00)

## 2021-08-30 LAB — VITAMIN B12: Vitamin B-12: 409 pg/mL (ref 211–911)

## 2021-08-30 NOTE — Assessment & Plan Note (Signed)
Concentration is worse lately  Pending fatigue labs May need to increase her adderall dose

## 2021-08-30 NOTE — Assessment & Plan Note (Signed)
D level with labs today  Used to take high dose weekly from weight clinic Now taking none

## 2021-08-30 NOTE — Patient Instructions (Signed)
Start the new plan with your trainer  Try and keep sleep times the same daily if you can Drink your water  Lab today for fatigue and cholesterol screen and diabetes screen

## 2021-08-30 NOTE — Assessment & Plan Note (Signed)
Working on diet Disc goals for lipids and reasons to control them Rev last labs with pt Rev low sat fat diet in detail Labs ordered today

## 2021-08-30 NOTE — Assessment & Plan Note (Signed)
Worse in the past week May be multi factorial  Also peri menopausal  Enc her to keep exercising and try new diet plan from trainer  Labs ordered today

## 2021-08-30 NOTE — Progress Notes (Signed)
Subjective:    Patient ID: Brittney [name], female    DOB: April 05, 1981, 41 y.o.   MRN: [id number]  This visit occurred during the SARS-CoV-2 public health emergency.  Safety protocols were in place, including screening questions prior to the visit, additional usage of staff PPE, and extensive cleaning of exam room while observing appropriate contact time as indicated for disinfecting solutions.   HPI Pt presents for fatigue/interested in lab work  [facility] Readings from Last 3 Encounters:  08/30/21 242 lb (109.8 kg)  06/30/21 238 lb (108 kg)  11/26/20 238 lb 9 oz (108.2 kg)   She went down to 229 lb at home between April and nov  Today her scale is 242  37.90 kg/m  Used to go to weight loss clinic   Also wants flu shot   Last 10 days-no energy  Last week- crashed after work and napped/ still slept all night long  Week before did not sleep   Night sweats -worse since her hysterectomy  She is hot natured / no flashes doing the day   Mood is about the same  Perhaps irritable here and there  No med changes    Gaining weight recently  Works out 4 days per week  Eating well/eats enough calories/ follows this closely  Was pretty good over holidays, few carby breakfasts   Avoids fried foods most of the time  Preps food at home/tries not to eat out   Trainer is starting her on 3 meals per day and 2 snacks : Egg whites/bacon/orange Protein shake /apple Chicken breast/ broccoli, sweet pot Shake/pb crackers Dinner- chicken/broccoli  Drinks a gallon of water per day  (does not urinate as much however)   Exercise- cardio/treadmill or rower  Works on different areas daily for strength  At least 4 d per week  Really pushes her  A lot of fun and feels better    Treated for ADD:  focus is not quite as good  May need inc dose of adderall     Mammogram is scheduled for 2/27 No lumps   She has had a hysterectomy  History of vit D def-she took high dose weekly Not taking  any over the counter   Lab Results  Component Value Date   HGBA1C 4.4 02/21/2019   Has family history of diabetes   Patient Active Problem List   Diagnosis Date Noted   Lipid screening 08/30/2021   Diabetes mellitus screening 08/30/2021   Mixed incontinence 06/30/2021   Overactive bladder 06/30/2021   Tongue lesion 10/26/2020   ADD (attention deficit disorder) 07/30/2020   Social anxiety disorder 07/30/2020   Back pain 03/20/2020   Attention and concentration deficit 12/28/2019   Allergic rhinitis 12/11/2019   Ketosis ([place]) 10/16/2019   Thirst 10/16/2019   Vitamin D deficiency 09/16/2019   Mild reactive airways disease 03/20/2019   Hot flashes 02/20/2019   Constipation 02/20/2019   Fatigue 02/20/2019   Right knee pain 10/17/2018   Dysuria 06/06/2018   Benign paroxysmal positional vertigo 05/16/2018   Postoperative state 01/03/2018   Lipoma of back 10/04/2017   Routine general medical examination at a health care facility 08/29/2017   Fibrocystic breast changes 01/08/2016   Hemorrhoids, external 09/30/2014   Chronic diarrhea 08/29/2014   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[place]) 07/04/2014   Hemorrhoids, internal, with bleeding 04/22/2014   Family history of pulmonary embolism 09/02/2013   Family history of diabetes mellitus 09/02/2013   Cutaneous skin tags 05/15/2013   Sclerosis, ilium, piriform  02/20/2012   Vaginal discharge 12/20/2010   Class 2 obesity due to excess calories without serious comorbidity with body mass index (BMI) of 37.0 to 37.9 in adult 03/31/2008   HEARING LOSS, MILD 03/31/2008   MIGRAINE HEADACHE 02/20/2007   IBS 02/20/2007   OSTEOARTHRITIS 02/20/2007   Past Medical History:  Diagnosis Date   Anemia    B12 deficiency    Back pain    Colon polyps    Coronary artery abnormality    spasms    Coronary artery spasm [facility])    reports saw cardiologist for chest pains , was told she was having coronary artery spasms , sent for cardio w/u with stress and echo   both unremarkable. reports today has not had any spasms or chest pains in over 2 years     Fibroids    Gallstones    Irritable bowel syndrome    Joint pain    Lactose intolerance    Migraine    hasnt had one in a long time   Obesity, unspecified    Vitamin D deficiency    Past Surgical History:  Procedure Laterality Date   CHOLECYSTECTOMY  05/06/11   COLONOSCOPY  11/2001   polyp; negative pathology   COLONOSCOPY  09/2004   Negative   COLONOSCOPY  2017 or 2018 patient  unsure    GI ;    COLONOSCOPY W/ POLYPECTOMY     CYSTOSCOPY N/A 01/03/2018   Procedure: CYSTOSCOPY;  Surgeon: Bobbye Charleston, MD;  Location: [facility];  Service: Gynecology;  Laterality: N/A;   ESOPHAGOGASTRODUODENOSCOPY  1/10   normal   FLEXIBLE SIGMOIDOSCOPY  03/05/2012   Procedure: FLEXIBLE SIGMOIDOSCOPY;  Surgeon: Inda Castle, MD;  Location: [facility];  Service: Endoscopy;  Laterality: N/A;   LAPAROSCOPIC DECORTICATION / DUBULKING / ABLATION RENAL CYSTS     saline histogram   ROBOTIC ASSISTED LAPAROSCOPIC HYSTERECTOMY AND SALPINGECTOMY Bilateral 01/03/2018   Procedure: XI ROBOTIC ASSISTED LAPAROSCOPIC HYSTERECTOMY AND SALPINGECTOMY;  Surgeon: Bobbye Charleston, MD;  Location: [facility];  Service: Gynecology;  Laterality: Bilateral;  WITH BED AFTER   TONSILLECTOMY  07/30/07   Uterine US  09/2005   Large endometrial stripe; small ovarian cyst   Social History   Tobacco Use   Smoking status: Never   Smokeless tobacco: Never  Vaping Use   Vaping Use: Never used  Substance Use Topics   Alcohol use: No    Alcohol/week: 0.0 standard drinks   Drug use: No   Family History  Problem Relation Age of Onset   Heart disease Father    Hypertension Father    Heart attack Father    Diabetes Father    Sleep apnea Mother    Obesity Mother    Ulcers Brother    Clotting disorder Brother    Ovarian cancer Maternal Grandmother    Colon polyps Maternal Grandmother    Colon cancer Maternal Grandmother         40's   Diabetes Paternal Grandmother    Hypertension Paternal Grandmother    Breast cancer Maternal Aunt        Age 99's   Diabetes Paternal Aunt        x 5   Lung cancer Maternal Grandfather        SMOKER   Allergies  Allergen Reactions   Prevacid [Lansoprazole] Rash   Current Outpatient Medications on File Prior to Visit  Medication Sig Dispense Refill   albuterol (VENTOLIN HFA) 108 (90 Base) MCG/ACT inhaler Inhale 2 puffs  into the lungs every 4 (four) hours as needed for wheezing. 18 g 3   amphetamine-dextroamphetamine (ADDERALL) 20 MG tablet Take one pill by mouth in am and one mid day 60 tablet 0   Azelastine HCl 0.15 % SOLN PLACE 2 SPRAYS INTO THE NOSE DAILY AS NEEDED (ALLERGIES). IN EACH NOSTRIL 90 mL 1   fluticasone (FLONASE) 50 MCG/ACT nasal spray PLACE 2 SPRAYS INTO BOTH NOSTRILS DAILY AS NEEDED (FOR ALLERGIES.). 48 mL 1   ibuprofen (ADVIL) 600 MG tablet Take 1 tablet (600 mg total) by mouth every 8 (eight) hours as needed for fever, headache, mild pain or moderate pain. With food 30 tablet 0   levocetirizine (XYZAL) 5 MG tablet Take 1 tablet (5 mg total) by mouth every evening. 90 tablet 3   montelukast (SINGULAIR) 10 MG tablet TAKE 1 TABLET BY MOUTH EVERYDAY AT BEDTIME 90 tablet 1   triamcinolone (KENALOG) 0.1 % paste Use as directed 1 application in the mouth or throat 2 (two) times daily. To affected area 5 g 1   solifenacin (VESICARE) 10 MG tablet Take 1 tablet (10 mg total) by mouth daily. (Patient not taking: Reported on 08/30/2021) 30 tablet 3   triamcinolone (KENALOG) 0.1 % Apply 1 application topically 2 (two) times daily as needed (for Eczema). 30 g 3   No current facility-administered medications on file prior to visit.    Review of Systems  Constitutional:  Positive for fatigue and unexpected weight change. Negative for activity change, appetite change and fever.  HENT:  Negative for congestion, ear pain, rhinorrhea, sinus pressure and sore throat.   Eyes:   Negative for pain, redness and visual disturbance.  Respiratory:  Negative for cough, shortness of breath and wheezing.        Unsure if she snores  Cardiovascular:  Negative for chest pain, palpitations and leg swelling.  Gastrointestinal:  Negative for abdominal pain, blood in stool, constipation and diarrhea.  Endocrine: Positive for heat intolerance. Negative for polydipsia and polyuria.  Genitourinary:  Negative for dysuria, frequency and urgency.  Musculoskeletal:  Negative for arthralgias, back pain and myalgias.  Skin:  Negative for pallor and rash.  Allergic/Immunologic: Negative for environmental allergies.  Neurological:  Negative for dizziness, syncope and headaches.  Hematological:  Negative for adenopathy. Does not bruise/bleed easily.  Psychiatric/Behavioral:  Positive for decreased concentration. Negative for dysphoric mood. The patient is not nervous/anxious.        ADD is worse lately      Objective:   Physical Exam Constitutional:      General: She is not in acute distress.    Appearance: Normal appearance. She is well-developed. She is obese. She is not ill-appearing or diaphoretic.  HENT:     Head: Normocephalic and atraumatic.     Right Ear: Tympanic membrane, ear canal and external ear normal.     Left Ear: Tympanic membrane, ear canal and external ear normal.     Nose: Nose normal. No congestion.     Mouth/Throat:     Mouth: Mucous membranes are moist.     Pharynx: Oropharynx is clear. No posterior oropharyngeal erythema.  Eyes:     General: No scleral icterus.    Extraocular Movements: Extraocular movements intact.     Conjunctiva/sclera: Conjunctivae normal.     Pupils: Pupils are equal, round, and reactive to light.  Neck:     Thyroid: No thyromegaly.     Vascular: No carotid bruit or JVD.  Cardiovascular:     Rate  and Rhythm: Normal rate and regular rhythm.     Pulses: Normal pulses.     Heart sounds: Normal heart sounds.    No gallop.  Pulmonary:      Effort: Pulmonary effort is normal. No respiratory distress.     Breath sounds: Normal breath sounds. No wheezing.     Comments: Good air exch Chest:     Chest wall: No tenderness.  Abdominal:     General: Bowel sounds are normal. There is no distension or abdominal bruit.     Palpations: Abdomen is soft. There is no mass.     Tenderness: There is no abdominal tenderness.     Hernia: No hernia is present.  Genitourinary:    Comments: Breast exam: No mass, nodules, thickening, tenderness, bulging, retraction, inflamation, nipple discharge or skin changes noted.  No axillary or clavicular LA.     Musculoskeletal:        General: No tenderness. Normal range of motion.     Cervical back: Normal range of motion and neck supple. No rigidity. No muscular tenderness.     Right lower leg: No edema.     Left lower leg: No edema.  Lymphadenopathy:     Cervical: No cervical adenopathy.  Skin:    General: Skin is warm and dry.     Coloration: Skin is not pale.     Findings: No erythema or rash.  Neurological:     Mental Status: She is alert. Mental status is at baseline.     Cranial Nerves: No cranial nerve deficit.     Motor: No abnormal muscle tone.     Coordination: Coordination normal.     Gait: Gait normal.     Deep Tendon Reflexes: Reflexes are normal and symmetric.  Psychiatric:        Attention and Perception: Attention normal.        Mood and Affect: Mood normal.        Cognition and Memory: Cognition and memory normal.     Comments: Pleasant and attentive           Assessment & Plan:   Problem List Items Addressed This Visit       Cardiovascular and Mediastinum   Hot flashes    On/off and sometimes hot natured Suspect peri menopausal (with hysterectomy)  May explain weight ups and downs and fluctuating energy level        Other   Fatigue - Primary    Worse in the past week May be multi factorial  Also peri menopausal  Enc her to keep exercising and try new  diet plan from trainer  Labs ordered today      Relevant Orders   CBC with Differential/Platelet (Completed)   Comprehensive metabolic panel (Completed)   TSH (Completed)   Vitamin B12 (Completed)   T4, free (Completed)   Vitamin D deficiency    D level with labs today  Used to take high dose weekly from weight clinic Now taking none      Relevant Orders   VITAMIN D 25 Hydroxy (Vit-D Deficiency, Fractures) (Completed)   ADD (attention deficit disorder)    Concentration is worse lately  Pending fatigue labs May need to increase her adderall dose       Lipid screening    Working on diet Disc goals for lipids and reasons to control them Rev last labs with pt Rev low sat fat diet in detail Labs ordered today  Relevant Orders   Lipid panel (Completed)   Diabetes mellitus screening    a1c ordered  Obese  disc imp of low glycemic diet and wt loss to prevent DM2       Relevant Orders   Hemoglobin A1c   Other Visit Diagnoses     Need for immunization against influenza       Relevant Orders   Flu Vaccine QUAD 27mo+IM (Fluarix, Fluzone & Alfiuria Quad PF) (Completed)

## 2021-08-30 NOTE — Assessment & Plan Note (Signed)
a1c ordered  Obese  disc imp of low glycemic diet and wt loss to prevent DM2

## 2021-08-30 NOTE — Assessment & Plan Note (Signed)
On/off and sometimes hot natured Suspect peri menopausal (with hysterectomy)  May explain weight ups and downs and fluctuating energy level

## 2021-08-31 ENCOUNTER — Encounter: Payer: Self-pay | Admitting: Family Medicine

## 2021-08-31 LAB — HEMOGLOBIN A1C
Hgb A1c MFr Bld: 4.4 % of total Hgb (ref <5.7)
Mean Plasma Glucose: 80 mg/dL
eAG (mmol/L): 4.4 mmol/L

## 2021-09-07 MED ORDER — AMPHETAMINE-DEXTROAMPHETAMINE 30 MG PO TABS: 30.0000 mg | ORAL_TABLET | Freq: Two times a day (BID) | ORAL | 0 refills | Status: DC

## 2021-09-07 NOTE — Addendum Note (Signed)
Addended by: Loura Pardon A on: 09/07/2021 09:24 PM   Modules accepted: Orders

## 2021-09-09 ENCOUNTER — Encounter: Payer: Self-pay | Admitting: Family Medicine

## 2021-09-15 NOTE — Telephone Encounter (Signed)
Never received any form from pharmacy saying PA was required and when I went to [email or web address] it told me it was an approved med. Called pharmacy and they said it does require a PA and they will fax over the form now saying that so I know what # to call

## 2021-09-15 NOTE — Telephone Encounter (Signed)
Pharmacy placed PA in [email or web address], it was sent to ins I will await a response

## 2021-09-16 NOTE — Telephone Encounter (Signed)
PA was approved. Letter placed in PCP's inbox for review.  Pt and pharmacy advised

## 2021-09-29 ENCOUNTER — Encounter: Payer: Self-pay | Admitting: Family Medicine

## 2021-09-29 DIAGNOSIS — K644 Residual hemorrhoidal skin tags: Secondary | ICD-10-CM

## 2021-09-29 DIAGNOSIS — K648 Other hemorrhoids: Secondary | ICD-10-CM

## 2021-09-29 NOTE — Telephone Encounter (Signed)
Please get this referral to the Pool  I think that Asthtyn may be out Thanks

## 2021-09-30 ENCOUNTER — Other Ambulatory Visit: Payer: Self-pay | Admitting: Family Medicine

## 2021-10-11 ENCOUNTER — Other Ambulatory Visit: Payer: Self-pay

## 2021-10-11 ENCOUNTER — Ambulatory Visit
Admission: RE | Admit: 2021-10-11 | Discharge: 2021-10-11 | Disposition: A | Discharge status: Home / Self Care | Payer: BC Managed Care – PPO | Source: Ambulatory Visit

## 2021-10-11 DIAGNOSIS — Z1231 Encounter for screening mammogram for malignant neoplasm of breast: Secondary | ICD-10-CM

## 2021-10-12 ENCOUNTER — Other Ambulatory Visit: Payer: Self-pay

## 2021-10-12 ENCOUNTER — Other Ambulatory Visit: Payer: Self-pay | Admitting: Family Medicine

## 2021-10-14 ENCOUNTER — Encounter: Payer: Self-pay | Admitting: Gastroenterology

## 2021-10-14 ENCOUNTER — Ambulatory Visit (INDEPENDENT_AMBULATORY_CARE_PROVIDER_SITE_OTHER): Payer: BC Managed Care – PPO | Admitting: Gastroenterology

## 2021-10-14 ENCOUNTER — Other Ambulatory Visit: Payer: Self-pay

## 2021-10-14 VITALS — BP 127/76 | HR 86 | Temp 98.9°F | Ht 67.0 in | Wt 243.2 lb

## 2021-10-14 DIAGNOSIS — K641 Second degree hemorrhoids: Secondary | ICD-10-CM | POA: Diagnosis not present

## 2021-10-14 NOTE — Progress Notes (Signed)

## 2021-10-14 NOTE — Progress Notes (Signed)
?  ?Cephas Darby, MD ?[address]  ?[address]  ?[place], [place] [address]  ?Main: [phone]  ?Fax: [phone] ? ? ? ?Gastroenterology Consultation ? ?Referring Provider:     Abner Greenspan, MD ?Primary Care Physician:  Tower, Wynelle Fanny, MD ?Primary Gastroenterologist:  Dr. Cephas Darby ?Reason for Consultation:     Symptomatic hemorrhoids ?      ? HPI:   ?Brittney [name] is a 41 y.o. female referred by Dr. Glori Bickers, Wynelle Fanny, MD  for consultation & management of symptomatic hemorrhoids.  Patient reports that she was experiencing alternating constipation and diarrhea, however she has been watching her diet and her GI symptoms have significantly improved.  She is no longer experiencing constipation.  She reports her bowel movements on Bristol stool scale as 4.  She spends about 15 minutes on the toilet daily.  She denies any abdominal pain, abdominal bloating.  Her hemorrhoidal symptoms include rectal bleeding, rectal pressure/discomfort, prolapse, itching.  She underwent several colonoscopies, last one in 2018, it was normal except for hemorrhoids.  She has family history of colon cancer.  No history of anemia.  Patient has tried several over-the-counter hemorrhoidal creams which did not provide any relief.  Her hemorrhoidal symptoms have been persistent for last 4 months, progressively worse.  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  She lost about 70 pounds altogether. ? ?NSAIDs: None ? ?Antiplts/Anticoagulants/Anti thrombotics: None ? ?GI Procedures:  ?Colonoscopy 10/05/2016 ?- The perianal and digital rectal examinations were normal. ?- Non-bleeding internal hemorrhoids were found during retroflexion. The hemorrhoids were ?small. ?- The exam was otherwise without abnormality. ? ?Flexible sigmoidoscopy ?FINAL DIAGNOSIS ?Diagnosis ?1. Surgical [P], descending, polyp ?- TUBULAR ADENOMA. ?- HIGH GRADE DYSPLASIA IS NOT IDENTIFIED. ?2. Surgical  [P], rectum, bx ?- BENIGN COLORECTAL MUCOSA WITH SCATTERED LYMPHOID AGGREGATES. ?- THERE IS NO EVIDENCE OF SIGNIFICANT ACTIVE COLITIS, IDIOPATHIC INFLAMMATION ?BOWEL DISEASE, DYSPLASIA, OR MALIGNANCY. ? ?Past Medical History:  ?Diagnosis Date  ? Anemia   ? B12 deficiency   ? Back pain   ? Colon polyps   ? Coronary artery abnormality   ? spasms   ? 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 reports saw cardiologist for chest pains , was told she was having coronary artery spasms , sent for cardio w/u with stress and echo  both unremarkable. reports today has not had any spasms or chest pains in over 2 years    ? Fibroids   ? Gallstones   ? Irritable bowel syndrome   ? Joint pain   ? Lactose intolerance   ? Migraine   ? hasnt had one in a long time  ? Obesity, unspecified   ? Vitamin D deficiency   ? ? ?Past Surgical History:  ?Procedure Laterality Date  ? CHOLECYSTECTOMY  05/06/11  ? COLONOSCOPY  11/2001  ? polyp; negative pathology  ? COLONOSCOPY  09/2004  ? Negative  ? COLONOSCOPY  2017 or 2018 patient  unsure  ? [facility] ;   ? COLONOSCOPY W/ POLYPECTOMY    ? CYSTOSCOPY N/A 01/03/2018  ? Procedure: CYSTOSCOPY;  Surgeon: Bobbye Charleston, MD;  Location: [facility];  Service: Gynecology;  Laterality: N/A;  ? ESOPHAGOGASTRODUODENOSCOPY  1/10  ? normal  ? FLEXIBLE SIGMOIDOSCOPY  03/05/2012  ? Procedure: FLEXIBLE SIGMOIDOSCOPY;  Surgeon: Inda Castle, MD;  Location: [facility];  Service: Endoscopy;  Laterality: N/A;  ? LAPAROSCOPIC DECORTICATION / DUBULKING /  ABLATION RENAL CYSTS    ? saline histogram  ? ROBOTIC ASSISTED LAPAROSCOPIC HYSTERECTOMY AND SALPINGECTOMY Bilateral 01/03/2018  ? Procedure: XI ROBOTIC ASSISTED LAPAROSCOPIC HYSTERECTOMY AND SALPINGECTOMY;  Surgeon: Bobbye Charleston, MD;  Location: [facility];  Service: Gynecology;  Laterality: Bilateral;  WITH BED AFTER  ? TONSILLECTOMY  07/30/07  ? Uterine US  09/2005  ? Large endometrial stripe; small ovarian cyst  ? ?Current Outpatient Medications:  ?  albuterol (VENTOLIN HFA)  108 (90 Base) MCG/ACT inhaler, Inhale 2 puffs into the lungs every 4 (four) hours as needed for wheezing., Disp: 18 g, Rfl: 3 ?  amphetamine-dextroamphetamine (ADDERALL) 30 MG tablet, Take 1 tablet by mouth 2 (two) times daily., Disp: 60 tablet, Rfl: 0 ?  fluticasone (FLONASE) 50 MCG/ACT nasal spray, PLACE 2 SPRAYS INTO BOTH NOSTRILS DAILY AS NEEDED (FOR ALLERGIES.)., Disp: 48 mL, Rfl: 1 ?  levocetirizine (XYZAL) 5 MG tablet, Take 1 tablet (5 mg total) by mouth every evening., Disp: 90 tablet, Rfl: 3 ?  montelukast (SINGULAIR) 10 MG tablet, TAKE 1 TABLET BY MOUTH EVERYDAY AT BEDTIME, Disp: 90 tablet, Rfl: 1 ?  triamcinolone (KENALOG) 0.1 % paste, Use as directed 1 application in the mouth or throat 2 (two) times daily. To affected area, Disp: 5 g, Rfl: 1 ?  triamcinolone (KENALOG) 0.1 %, Apply 1 application topically 2 (two) times daily as needed (for Eczema)., Disp: 30 g, Rfl: 3 ? ?Family History  ?Problem Relation Age of Onset  ? Heart disease Father   ? Hypertension Father   ? Heart attack Father   ? Diabetes Father   ? Sleep apnea Mother   ? Obesity Mother   ? Ulcers Brother   ? Clotting disorder Brother   ? Ovarian cancer Maternal Grandmother   ? Colon polyps Maternal Grandmother   ? Colon cancer Maternal Grandmother   ?     40's  ? Diabetes Paternal Grandmother   ? Hypertension Paternal Grandmother   ? Breast cancer Maternal Aunt   ?     Age 34's  ? Diabetes Paternal Aunt   ?     x 5  ? Lung cancer Maternal Grandfather   ?     SMOKER  ?  ? ?Social History  ? ?Tobacco Use  ? Smoking status: Never  ? Smokeless tobacco: Never  ?Vaping Use  ? Vaping Use: Never used  ?Substance Use Topics  ? Alcohol use: No  ?  Alcohol/week: 0.0 standard drinks  ? Drug use: No  ? ? ?Allergies as of 10/14/2021 - Review Complete 10/14/2021  ?Allergen Reaction Noted  ? Prevacid [lansoprazole] Rash 02/20/2007  ? ? ?Review of Systems:    ?All systems reviewed and negative except where noted in HPI. ? ? Physical Exam:  ?BP 127/76 (BP  Location: Left Arm, Patient Position: Sitting, Cuff Size: Normal)   Pulse 86   Temp 98.9 ?F (37.2 ?C) (Oral)   Ht 5\' 7"  (1.702 m)   Wt 243 lb 4 oz (110.3 kg)   LMP 12/21/2017   BMI 38.10 kg/m?  ?Patient's last menstrual period was 12/21/2017. ? ?General:   Alert,  Well-developed, well-nourished, pleasant and cooperative in NAD ?Head:  Normocephalic and atraumatic. ?Eyes:  Sclera clear, no icterus.   Conjunctiva pink. ?Ears:  Normal auditory acuity. ?Nose:  No deformity, discharge, or lesions. ?Mouth:  No deformity or lesions,oropharynx pink & moist. ?Neck:  Supple; no masses or thyromegaly. ?Lungs:  Respirations even and unlabored.  Clear throughout to auscultation.   No  wheezes, crackles, or rhonchi. No acute distress. ?Heart:  Regular rate and rhythm; no murmurs, clicks, rubs, or gallops. ?Abdomen:  Normal bowel sounds. Soft, non-tender and non-distended without masses, hepatosplenomegaly or hernias noted.  No guarding or rebound tenderness.   ?Rectal: Small perianal skin tags, nontender digital rectal exam, anoscopy revealed external and internal hemorrhoids ?Msk:  Symmetrical without gross deformities. Good, equal movement & strength bilaterally. ?Pulses:  Normal pulses noted. ?Extremities:  No clubbing or edema.  No cyanosis. ?Neurologic:  Alert and oriented x3;  grossly normal neurologically. ?Skin:  Intact without significant lesions or rashes. No jaundice. ?Psych:  Alert and cooperative. Normal mood and affect. ? ?Imaging Studies: Reviewed ?No abdominal imaging ? ?Assessment and Plan:  ? ?Tayler Heiden is a 41 y.o. African-American female with grade 2 symptomatic hemorrhoids.  Discussed about outpatient hemorrhoid ligation, the procedure, risks and benefits.  Consent obtained, perform hemorrhoid ligation today ?Advised patient to restrain from lifting heavy weights for next 2 to 3 days ? ?Follow up in 2 weeks ? ? ?Cephas Darby, MD  ?

## 2021-10-15 ENCOUNTER — Other Ambulatory Visit: Payer: Self-pay | Admitting: Family Medicine

## 2021-10-24 ENCOUNTER — Encounter: Payer: Self-pay | Admitting: Emergency Medicine

## 2021-10-24 ENCOUNTER — Ambulatory Visit
Admission: EM | Admit: 2021-10-24 | Discharge: 2021-10-24 | Disposition: A | Discharge status: Home / Self Care | Payer: BC Managed Care – PPO | Attending: Emergency Medicine | Admitting: Emergency Medicine

## 2021-10-24 DIAGNOSIS — R8290 Unspecified abnormal findings in urine: Secondary | ICD-10-CM

## 2021-10-24 DIAGNOSIS — M5442 Lumbago with sciatica, left side: Secondary | ICD-10-CM | POA: Diagnosis not present

## 2021-10-24 DIAGNOSIS — K649 Unspecified hemorrhoids: Secondary | ICD-10-CM

## 2021-10-24 DIAGNOSIS — M5441 Lumbago with sciatica, right side: Secondary | ICD-10-CM | POA: Diagnosis not present

## 2021-10-24 LAB — POCT URINALYSIS DIP (MANUAL ENTRY)
Bilirubin, UA: NEGATIVE
Blood, UA: NEGATIVE
Glucose, UA: NEGATIVE mg/dL
Ketones, POC UA: NEGATIVE mg/dL
Leukocytes, UA: NEGATIVE
Nitrite, UA: NEGATIVE
Protein Ur, POC: NEGATIVE mg/dL
Spec Grav, UA: 1.025 (ref 1.010–1.025)
Urobilinogen, UA: 0.2 E.U./dL
pH, UA: 5.5 (ref 5.0–8.0)

## 2021-10-24 MED ORDER — IBUPROFEN 600 MG PO TABS
600.0000 mg | ORAL_TABLET | Freq: Four times a day (QID) | ORAL | 0 refills | Status: DC | PRN
Start: 2021-10-24 — End: 2021-12-07

## 2021-10-24 MED ORDER — CYCLOBENZAPRINE HCL 10 MG PO TABS: 10.0000 mg | ORAL_TABLET | Freq: Two times a day (BID) | ORAL | 0 refills | Status: DC | PRN

## 2021-10-24 NOTE — ED Triage Notes (Signed)
Pt here with bilateral LBP x 4 days with new hemorrhoid flare up. Pt has a hemorrhoid banding scheduled. ?

## 2021-10-24 NOTE — Discharge Instructions (Addendum)
Take ibuprofen as directed.  Take the muscle relaxer as needed for muscle spasm; Do not drive, operate machinery, or drink alcohol with this medication as it can cause drowsiness.  ? ?Follow up with your primary care provider or an orthopedist if your symptoms are not improving.   ? ? ?

## 2021-10-24 NOTE — ED Provider Notes (Signed)
[facility]    CSN: [id number] Arrival date & time: 10/24/21  5643      History   Chief Complaint Chief Complaint  Patient presents with   Back Pain   Hemorrhoids    HPI Brittney [name] is a 41 y.o. female.  Patient presents with 4-day history of left lower back pain which radiates to both buttocks and hips.  No falls or injury. No saddle anesthesia, loss of bowel/bladder control, numbness, weakness.  She also notes malodorous urine.  She has hemorrhoids and states sometimes she gets a UTI when her hemorrhoids flare up.  She denies fever, chills, abdominal pain, dysuria, hematuria, vaginal discharge, pelvic pain, or other symptoms.  Treatment at home with Biofreeze patch to right lower back.  Patient was seen by gastroenterologist on 10/14/2021; diagnosed with Grade II hemorrhoids; hemorrhoid ligation performed at that time; she is scheduled for follow-up on 11/02/2021.  Her medical history also includes obesity, migraine headaches, IBS, osteoarthritis, vertigo, mild reactive airway disease, attention deficit disorder, social anxiety disorder, fibroids, gallstones.  The history is provided by the patient and medical records.   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 reports today has not had any spasms or chest pains in over 2 years     Fibroids    Gallstones    Irritable bowel syndrome    Joint pain    Lactose intolerance    Migraine    hasnt had one in a long time   Obesity, unspecified    Vitamin D deficiency     Patient Active Problem List   Diagnosis Date Noted   Lipid screening 08/30/2021   Diabetes mellitus screening 08/30/2021   Mixed incontinence 06/30/2021   Overactive  bladder 06/30/2021   Tongue lesion 10/26/2020   ADD (attention deficit disorder) 07/30/2020   Social anxiety disorder 07/30/2020   Back pain 03/20/2020   Attention and concentration deficit 12/28/2019   Allergic rhinitis 12/11/2019   Ketosis ([place]) 10/16/2019   Thirst 10/16/2019   Vitamin D deficiency 09/16/2019   Mild reactive airways disease 03/20/2019   Hot flashes 02/20/2019   Constipation 02/20/2019   Fatigue 02/20/2019   Right knee pain 10/17/2018   Dysuria 06/06/2018   Benign paroxysmal positional vertigo 05/16/2018   Postoperative state 01/03/2018   Lipoma of back 10/04/2017   Routine general medical examination at a health care facility 08/29/2017   Fibrocystic breast changes 01/08/2016   Hemorrhoids, external 09/30/2014   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[place]) 07/04/2014   Hemorrhoids, internal, with bleeding 04/22/2014   Family history of pulmonary embolism 09/02/2013   Family history of diabetes mellitus 09/02/2013   Cutaneous skin tags 05/15/2013   Sclerosis, ilium, 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   Vaginal discharge 12/20/2010   Class 2 obesity due to excess calories without serious comorbidity with body mass index (BMI) of 37.0 to 37.9 in adult 03/31/2008   HEARING LOSS, MILD 03/31/2008   MIGRAINE HEADACHE 02/20/2007   IBS 02/20/2007   OSTEOARTHRITIS 02/20/2007    Past Surgical History:  Procedure Laterality Date   CHOLECYSTECTOMY  05/06/11   COLONOSCOPY  11/2001   polyp; negative pathology  COLONOSCOPY  09/2004   Negative   COLONOSCOPY  2017 or 2018 patient  unsure    GI ;    COLONOSCOPY W/ POLYPECTOMY     CYSTOSCOPY N/A 01/03/2018   Procedure: CYSTOSCOPY;  Surgeon: Bobbye Charleston, MD;  Location: [facility];  Service: Gynecology;  Laterality: N/A;   ESOPHAGOGASTRODUODENOSCOPY  1/10   normal   FLEXIBLE SIGMOIDOSCOPY  03/05/2012   Procedure: FLEXIBLE SIGMOIDOSCOPY;  Surgeon: Inda Castle, MD;  Location: [facility];  Service: Endoscopy;  Laterality: N/A;   LAPAROSCOPIC  DECORTICATION / DUBULKING / ABLATION RENAL CYSTS     saline histogram   ROBOTIC ASSISTED LAPAROSCOPIC HYSTERECTOMY AND SALPINGECTOMY Bilateral 01/03/2018   Procedure: XI ROBOTIC ASSISTED LAPAROSCOPIC HYSTERECTOMY AND SALPINGECTOMY;  Surgeon: Bobbye Charleston, MD;  Location: [facility];  Service: Gynecology;  Laterality: Bilateral;  WITH BED AFTER   TONSILLECTOMY  07/30/07   Uterine US  09/2005   Large endometrial stripe; small ovarian cyst    OB History     Gravida  2   Para  2   Term  2   Preterm  0   AB  0   Living  2      SAB  0   IAB  0   Ectopic  0   Multiple  0   Live Births  1            Home Medications    Prior to Admission medications   Medication Sig Start Date End Date Taking? Authorizing Provider  cyclobenzaprine (FLEXERIL) 10 MG tablet Take 1 tablet (10 mg total) by mouth 2 (two) times daily as needed for muscle spasms. 10/24/21  Yes Sharion Balloon, NP  fluticasone (FLONASE) 50 MCG/ACT nasal spray PLACE 2 SPRAYS INTO BOTH NOSTRILS DAILY AS NEEDED (FOR ALLERGIES.). 10/18/21   Tower, Wynelle Fanny, MD  ibuprofen (ADVIL) 600 MG tablet Take 1 tablet (600 mg total) by mouth every 6 (six) hours as needed. 10/24/21  Yes Sharion Balloon, NP  albuterol (VENTOLIN HFA) 108 (90 Base) MCG/ACT inhaler Inhale 2 puffs into the lungs every 4 (four) hours as needed for wheezing. 12/11/19   Tower, Wynelle Fanny, MD  amphetamine-dextroamphetamine (ADDERALL) 30 MG tablet Take 1 tablet by mouth 2 (two) times daily. 09/07/21   Tower, Wynelle Fanny, MD  levocetirizine (XYZAL) 5 MG tablet Take 1 tablet (5 mg total) by mouth every evening. 09/24/19   Tower, Wynelle Fanny, MD  montelukast (SINGULAIR) 10 MG tablet TAKE 1 TABLET BY MOUTH EVERYDAY AT BEDTIME 10/21/20   Tower, Mecca A, MD  triamcinolone (KENALOG) 0.1 % paste Use as directed 1 application in the mouth or throat 2 (two) times daily. To affected area 10/26/20   Tower, Wynelle Fanny, MD  triamcinolone (KENALOG) 0.1 % Apply 1 application topically 2 (two) times daily  as needed (for Eczema). 10/26/20   Tower, Wynelle Fanny, MD    Family History Family History  Problem Relation Age of Onset   Heart disease Father    Hypertension Father    Heart attack Father    Diabetes Father    Sleep apnea Mother    Obesity Mother    Ulcers Brother    Clotting disorder Brother    Ovarian cancer Maternal Grandmother    Colon polyps Maternal Grandmother    Colon cancer Maternal Grandmother        40's   Diabetes Paternal Grandmother    Hypertension Paternal Grandmother    Breast cancer Maternal Aunt  Age 25's   Diabetes Paternal Aunt        x 5   Lung cancer Maternal Grandfather        SMOKER    Social History Social History   Tobacco Use   Smoking status: Never   Smokeless tobacco: Never  Vaping Use   Vaping Use: Never used  Substance Use Topics   Alcohol use: No    Alcohol/week: 0.0 standard drinks   Drug use: No     Allergies   Prevacid [lansoprazole]   Review of Systems Review of Systems  Constitutional:  Negative for chills and fever.  Gastrointestinal:  Negative for abdominal pain, blood in stool, constipation, diarrhea and vomiting.  Genitourinary:  Negative for dysuria, flank pain, hematuria, pelvic pain and vaginal discharge.       Malodorous urine  Musculoskeletal:  Positive for back pain. Negative for gait problem.  Skin:  Negative for color change and rash.  Neurological:  Negative for weakness and numbness.  All other systems reviewed and are negative.   Physical Exam Triage Vital Signs ED Triage Vitals  Enc Vitals Group     BP      Pulse      Resp      Temp      Temp src      SpO2      Weight      Height      Head Circumference      Peak Flow      Pain Score      Pain Loc      Pain Edu?      Excl. in [place]?    No data found.  Updated Vital Signs BP 104/72    Pulse 74    Temp 98.1 F (36.7 C)    Resp 18    LMP 12/21/2017    SpO2 98%   Visual Acuity Right Eye Distance:   Left Eye Distance:   Bilateral  Distance:    Right Eye Near:   Left Eye Near:    Bilateral Near:     Physical Exam Vitals and nursing note reviewed.  Constitutional:      General: She is not in acute distress.    Appearance: She is well-developed. She is obese. She is not ill-appearing.  HENT:     Mouth/Throat:     Mouth: Mucous membranes are moist.  Cardiovascular:     Rate and Rhythm: Normal rate and regular rhythm.     Heart sounds: Normal heart sounds.  Pulmonary:     Effort: Pulmonary effort is normal. No respiratory distress.     Breath sounds: Normal breath sounds.  Abdominal:     General: Bowel sounds are normal.     Palpations: Abdomen is soft.     Tenderness: There is no abdominal tenderness. There is no right CVA tenderness, left CVA tenderness, guarding or rebound.  Genitourinary:    Comments: Small flesh-colored external hemorrhoids. Musculoskeletal:        General: No swelling, tenderness, deformity or signs of injury. Normal range of motion.     Cervical back: Neck supple.       Back:  Skin:    General: Skin is warm and dry.     Capillary Refill: Capillary refill takes less than 2 seconds.     Findings: No bruising, erythema, lesion or rash.  Neurological:     General: No focal deficit present.     Mental Status: She is alert and  oriented to person, place, and time.     Sensory: No sensory deficit.     Motor: No weakness.     Gait: Gait normal.  Psychiatric:        Mood and Affect: Mood normal.        Behavior: Behavior normal.     UC Treatments / Results  Labs (all labs ordered are listed, but only abnormal results are displayed) Labs Reviewed  POCT URINALYSIS DIP (MANUAL ENTRY) - Normal    EKG   Radiology No results found.  Procedures Procedures (including critical care time)  Medications Ordered in UC Medications - No data to display  Initial Impression / Assessment and Plan / UC Course  I have reviewed the triage vital signs and the nursing notes.  Pertinent  labs & imaging results that were available during my care of the patient were reviewed by me and considered in my medical decision making (see chart for details).   Acute left lower back pain with bilateral sciatica; malodorous urine; hemorrhoids. Urine normal.  External hemorrhoids are small and flesh-colored; she is followed by GI for these.  Treating back pain with ibuprofen and Flexeril.  Precautions for drowsiness with Flexeril discussed.  Instructed patient to follow up with her PCP or ortho if her symptoms are not improving.  She agrees to plan of care.     Final Clinical Impressions(s) / UC Diagnoses   Final diagnoses:  Acute left-sided low back pain with bilateral sciatica  Malodorous urine  Hemorrhoids, unspecified hemorrhoid type     Discharge Instructions      Take ibuprofen as directed.  Take the muscle relaxer as needed for muscle spasm; Do not drive, operate machinery, or drink alcohol with this medication as it can cause drowsiness.   Follow up with your primary care provider or an orthopedist if your symptoms are not improving.         ED Prescriptions     Medication Sig Dispense Auth. Provider   cyclobenzaprine (FLEXERIL) 10 MG tablet Take 1 tablet (10 mg total) by mouth 2 (two) times daily as needed for muscle spasms. 20 tablet Sharion Balloon, NP   ibuprofen (ADVIL) 600 MG tablet Take 1 tablet (600 mg total) by mouth every 6 (six) hours as needed. 30 tablet Sharion Balloon, NP      I have reviewed the PDMP during this encounter.   Sharion Balloon, NP 10/24/21 [phone]

## 2021-10-30 ENCOUNTER — Other Ambulatory Visit: Payer: Self-pay

## 2021-10-30 ENCOUNTER — Emergency Department
Admission: EM | Admit: 2021-10-30 | Discharge: 2021-10-30 | Disposition: A | Discharge status: Home / Self Care | Payer: BC Managed Care – PPO | Attending: Emergency Medicine | Admitting: Emergency Medicine

## 2021-10-30 ENCOUNTER — Emergency Department: Payer: BC Managed Care – PPO

## 2021-10-30 ENCOUNTER — Encounter: Payer: Self-pay | Admitting: Intensive Care

## 2021-10-30 DIAGNOSIS — R197 Diarrhea, unspecified: Secondary | ICD-10-CM | POA: Diagnosis not present

## 2021-10-30 DIAGNOSIS — R1032 Left lower quadrant pain: Secondary | ICD-10-CM

## 2021-10-30 DIAGNOSIS — K6289 Other specified diseases of anus and rectum: Secondary | ICD-10-CM | POA: Diagnosis present

## 2021-10-30 HISTORY — DX: Attention-deficit hyperactivity disorder, unspecified type: F90.9

## 2021-10-30 LAB — CBC WITH DIFFERENTIAL/PLATELET
Abs Immature Granulocytes: 0.02 10*3/uL (ref 0.00–0.07)
Basophils Absolute: 0.1 10*3/uL (ref 0.0–0.1)
Basophils Relative: 1 %
Eosinophils Absolute: 0.1 10*3/uL (ref 0.0–0.5)
Eosinophils Relative: 1 %
HCT: 42.9 % (ref 36.0–46.0)
Hemoglobin: 13.8 g/dL (ref 12.0–15.0)
Immature Granulocytes: 0 %
Lymphocytes Relative: 22 %
Lymphs Abs: 1.6 10*3/uL (ref 0.7–4.0)
MCH: 26.9 pg (ref 26.0–34.0)
MCHC: 32.2 g/dL (ref 30.0–36.0)
MCV: 83.6 fL (ref 80.0–100.0)
Monocytes Absolute: 0.4 10*3/uL (ref 0.1–1.0)
Monocytes Relative: 6 %
Neutro Abs: 4.9 10*3/uL (ref 1.7–7.7)
Neutrophils Relative %: 70 %
Platelets: 279 10*3/uL (ref 150–400)
RBC: 5.13 MIL/uL — ABNORMAL HIGH (ref 3.87–5.11)
RDW: 14.7 % (ref 11.5–15.5)
WBC: 7 10*3/uL (ref 4.0–10.5)
nRBC: 0 % (ref 0.0–0.2)

## 2021-10-30 LAB — URINALYSIS, ROUTINE W REFLEX MICROSCOPIC
Bilirubin Urine: NEGATIVE
Glucose, UA: NEGATIVE mg/dL
Hgb urine dipstick: NEGATIVE
Ketones, ur: NEGATIVE mg/dL
Leukocytes,Ua: NEGATIVE
Nitrite: NEGATIVE
Protein, ur: NEGATIVE mg/dL
Specific Gravity, Urine: 1.026 (ref 1.005–1.030)
pH: 5 (ref 5.0–8.0)

## 2021-10-30 LAB — COMPREHENSIVE METABOLIC PANEL
ALT: 14 U/L (ref 0–44)
AST: 19 U/L (ref 15–41)
Albumin: 3.8 g/dL (ref 3.5–5.0)
Alkaline Phosphatase: 63 U/L (ref 38–126)
Anion gap: 3 — ABNORMAL LOW (ref 5–15)
BUN: 13 mg/dL (ref 6–20)
CO2: 27 mmol/L (ref 22–32)
Calcium: 9.2 mg/dL (ref 8.9–10.3)
Chloride: 109 mmol/L (ref 98–111)
Creatinine, Ser: 0.99 mg/dL (ref 0.44–1.00)
GFR, Estimated: >60 mL/min (ref >60)
Glucose, Bld: 85 mg/dL (ref 70–99)
Potassium: 4.7 mmol/L (ref 3.5–5.1)
Sodium: 139 mmol/L (ref 135–145)
Total Bilirubin: 0.5 mg/dL (ref 0.3–1.2)
Total Protein: 7.4 g/dL (ref 6.5–8.1)

## 2021-10-30 LAB — LIPASE, BLOOD: Lipase: 40 U/L (ref 11–51)

## 2021-10-30 MED ORDER — IOHEXOL 300 MG/ML  SOLN
100.0000 mL | Freq: Once | INTRAMUSCULAR | Status: AC | PRN
  Administered 2021-10-30: 100 mL via INTRAVENOUS
  Filled 2021-10-30: qty 100

## 2021-10-30 NOTE — ED Notes (Signed)
Dc instructions reviewed with pt no questions or concerns at this time.  

## 2021-10-30 NOTE — ED Notes (Signed)
Pt c/o LLQ abdominal pain and rectal pain from hemorrhoids. Pt AOX4, NAD noted.  ?

## 2021-10-30 NOTE — ED Triage Notes (Signed)
10/15/21 patient had hemorrhoid band and has had pain ever since. Reports pain constant and worsening when having BM. Left sided abdominal pain started yesterday. Denies N/V ?

## 2021-10-30 NOTE — ED Provider Notes (Signed)
? ?[facility] ?Provider Note ? ? ? Event Date/Time  ? First MD Initiated Contact with Patient 10/30/21 1336   ?  (approximate) ? ? ?History  ? ?Chief Complaint ?Rectal Pain ? ? ?HPI ?Brittney [name] is a 41 y.o. female, history of hysterectomy, hemorrhoids, gallstones, IBS, obesity, migraines, ADD, reactive airway disease, presents to the emergency department for evaluation of abdominal pain/rectal pain.  Patient states that she has internal and external hemorrhoids, for which she had banding performed of her internal hemorrhoids on 10/14/2021.  She states that she did not have any immediate complications, however gradually has had worsening left lower quadrant pain and significant rectal pain whenever she has a bowel movement.  Additionally endorses diarrhea with a greenish color.  Denies fever/chills, urinary symptoms, chest pain, shortness of breath, cough/congestion, rectal bleeding, vomiting, headache, or lightheadedness/dizziness. ? ?Per external records review, patient was last seen by gastroenterologist on 10/14/2021 for banding of grade 2 hemorrhoids. ? ?History Limitations: No limitations. ? ?  ? ? ?Physical Exam  ?Triage Vital Signs: ?ED Triage Vitals  ?Enc Vitals Group  ?   BP 10/30/21 1316 111/77  ?   Pulse Rate 10/30/21 1316 68  ?   Resp 10/30/21 1316 18  ?   Temp 10/30/21 1316 98.4 ?F (36.9 ?C)  ?   Temp Source 10/30/21 1316 Oral  ?   SpO2 10/30/21 1316 100 %  ?   Weight 10/30/21 1325 237 lb (107.5 kg)  ?   Height 10/30/21 1325 '5\' 8"'$  (1.727 m)  ?   Head Circumference --   ?   Peak Flow --   ?   Pain Score 10/30/21 1325 10  ?   Pain Loc --   ?   Pain Edu? --   ?   Excl. in [place]? --   ? ? ?Most recent vital signs: ?Vitals:  ? 10/30/21 1316  ?BP: 111/77  ?Pulse: 68  ?Resp: 18  ?Temp: 98.4 ?F (36.9 ?C)  ?SpO2: 100%  ? ? ?General: Awake, NAD.  ?Skin: Warm, dry.  ?CV: Good peripheral perfusion.  ?Resp: Normal effort.  ?Abd: Soft, no distention.  Mild tenderness when palpating the  left lower quadrant. ?Neuro: At baseline. No gross neurological deficits.  ?Other: Rectal exam unremarkable.  No obvious hemorrhoids or rectal bleeding. ? ?Physical Exam ? ? ? ?ED Results / Procedures / Treatments  ?Labs ?(all labs ordered are listed, but only abnormal results are displayed) ?Labs Reviewed  ?URINALYSIS, ROUTINE W REFLEX MICROSCOPIC - Abnormal; Notable for the following components:  ?    Result Value  ? Color, Urine YELLOW (*)   ? APPearance HAZY (*)   ? All other components within normal limits  ?COMPREHENSIVE METABOLIC PANEL - Abnormal; Notable for the following components:  ? Anion gap 3 (*)   ? All other components within normal limits  ?CBC WITH DIFFERENTIAL/PLATELET - Abnormal; Notable for the following components:  ? RBC 5.13 (*)   ? All other components within normal limits  ?LIPASE, BLOOD  ? ? ? ?EKG ?Not applicable. ? ? ?RADIOLOGY ? ?ED Provider Interpretation: I personally reviewed and interpreted the CT, no evidence of acute pathology. ? ?CT Abdomen Pelvis W Contrast ? ?Result Date: 10/30/2021 ?CLINICAL DATA:  Left lower quadrant abdominal pain, recent hemorrhoid EXAM: CT ABDOMEN AND PELVIS WITH CONTRAST TECHNIQUE: Multidetector CT imaging of the abdomen and pelvis was performed using the standard protocol following bolus administration of intravenous contrast. RADIATION DOSE REDUCTION: This exam  was performed according to the departmental dose-optimization program which includes automated exposure control, adjustment of the mA and/or kV according to patient size and/or use of iterative reconstruction technique. CONTRAST:  157m OMNIPAQUE IOHEXOL 300 MG/ML  SOLN COMPARISON:  02/17/2012 FINDINGS: Lower chest: No acute abnormality. Hepatobiliary: No focal liver abnormality is seen. Status post cholecystectomy. No biliary dilatation. Pancreas: Unremarkable. No pancreatic ductal dilatation or surrounding inflammatory changes. Spleen: Normal in size without significant abnormality.  Adrenals/Urinary Tract: Adrenal glands are unremarkable. Kidneys are normal, without renal calculi, solid lesion, or hydronephrosis. Bladder is unremarkable. Stomach/Bowel: Stomach is within normal limits. Appendix appears normal. No evidence of bowel wall thickening, distention, or inflammatory changes. Vascular/Lymphatic: No significant vascular findings are present. No enlarged abdominal or pelvic lymph nodes. Reproductive: Status post hysterectomy. Corpus luteum of the left ovary (series 2, image 635. Other: No abdominal wall hernia or abnormality. No ascites. Musculoskeletal: No acute or significant osseous findings. IMPRESSION: 1. No acute CT findings of the abdomen or pelvis to explain left lower quadrant pain. 2. Status post hysterectomy and cholecystectomy. Electronically Signed   By: ADelanna AhmadiM.D.   On: 10/30/2021 16:05   ? ?PROCEDURES: ? ?Critical Care performed: None. ? ?Procedures ? ? ? ?MEDICATIONS ORDERED IN ED: ?Medications  ?iohexol (OMNIPAQUE) 300 MG/ML solution 100 mL (100 mLs Intravenous Contrast Given 10/30/21 1547)  ? ? ? ?IMPRESSION / MDM / ASSESSMENT AND PLAN / ED COURSE  ?I reviewed the triage vital signs and the nursing notes. ?             ?               ? ? ?Differential diagnosis includes, but is not limited to, diverticulitis, bowel perforation, internal/external hemorrhoids, cystitis, pyelonephritis. ? ?ED Course ?Patient appears well.  Vital signs within normal limits.  NAD. ? ?CBC shows no evidence of leukocytosis or anemia.  CMP shows no evidence of electrolyte abnormalities, transaminitis, or kidney injury.  Lipase unremarkable at 40.  Unlikely pancreatitis. ? ?Urinalysis shows no evidence of urinary tract infection. ? ?Assessment/Plan ?Patient presents with lower left abdominal pain and rectal pain.  Abdominal CT shows no acute pathology.  Urinalysis is negative.  Lab work-up overall unremarkable.  Patient is currently stable in the room with no vital sign abnormalities.  I  do not suspect any serious or life-threatening pathology, though it is possible that patient experienced a complication related to her recent hemorrhoid banding procedure. Advised her to follow-up with her gastroenterologist for further evaluation and management.  We will plan to discharge. ? ?Patient was provided with anticipatory guidance, return precautions, and educational material. Encouraged the patient to return to the emergency department at any time if they begin to experience any new or worsening symptoms.  ? ?  ? ? ?FINAL CLINICAL IMPRESSION(S) / ED DIAGNOSES  ? ?Final diagnoses:  ?Left lower quadrant abdominal pain  ?Rectal pain  ? ? ? ?Rx / DC Orders  ? ?ED Discharge Orders   ? ? None  ? ?  ? ? ? ?Note:  This document was prepared using Dragon voice recognition software and may include unintentional dictation errors. ?  ?STeodoro Spray PUtah?10/30/21 1624 ? ?  ?MRada Hay MD ?10/30/21 1935 ? ?

## 2021-10-30 NOTE — Discharge Instructions (Addendum)
-  Follow-up with your gastroenterologist as discussed. ?-Recommend Imodium and low FODMAP diet for the management of diarrhea. ?-Hydrate frequently. ?

## 2021-11-02 ENCOUNTER — Other Ambulatory Visit: Payer: Self-pay

## 2021-11-02 ENCOUNTER — Ambulatory Visit: Payer: BC Managed Care – PPO | Admitting: Gastroenterology

## 2021-11-02 ENCOUNTER — Encounter: Payer: Self-pay | Admitting: Gastroenterology

## 2021-11-02 VITALS — BP 131/84 | HR 76 | Temp 99.1°F | Ht 67.0 in | Wt 246.4 lb

## 2021-11-02 DIAGNOSIS — K641 Second degree hemorrhoids: Secondary | ICD-10-CM

## 2021-11-02 NOTE — Progress Notes (Signed)

## 2021-11-08 ENCOUNTER — Other Ambulatory Visit: Payer: Self-pay | Admitting: Family Medicine

## 2021-11-08 MED ORDER — AMPHETAMINE-DEXTROAMPHETAMINE 30 MG PO TABS: 30.0000 mg | ORAL_TABLET | Freq: Two times a day (BID) | ORAL | 0 refills | Status: DC

## 2021-11-08 NOTE — Telephone Encounter (Signed)
Name of Medication: Adderall 30 mg ?Name of Pharmacy: [facility] [address] ?Last Fill or Written Date and Quantity: 09/07/21 #60 tabs/ 0 refills ?Last Office Visit and Type: Fatigue on 08/30/21 ?Next Office Visit and Type: none scheduled  ? ?  ?

## 2021-11-10 ENCOUNTER — Encounter: Payer: Self-pay | Admitting: Gastroenterology

## 2021-11-10 DIAGNOSIS — K625 Hemorrhage of anus and rectum: Secondary | ICD-10-CM

## 2021-11-11 LAB — CBC
Hematocrit: 41.4 % (ref 34.0–46.6)
Hemoglobin: 13.8 g/dL (ref 11.1–15.9)
MCH: 27.6 pg (ref 26.6–33.0)
MCHC: 33.3 g/dL (ref 31.5–35.7)
MCV: 83 fL (ref 79–97)
Platelets: 286 10*3/uL (ref 150–450)
RBC: 5 x10E6/uL (ref 3.77–5.28)
RDW: 14.2 % (ref 11.7–15.4)
WBC: 6.6 10*3/uL (ref 3.4–10.8)

## 2021-11-30 ENCOUNTER — Ambulatory Visit (INDEPENDENT_AMBULATORY_CARE_PROVIDER_SITE_OTHER): Payer: BC Managed Care – PPO | Admitting: Gastroenterology

## 2021-11-30 ENCOUNTER — Other Ambulatory Visit: Payer: Self-pay

## 2021-11-30 ENCOUNTER — Encounter: Payer: Self-pay | Admitting: Gastroenterology

## 2021-11-30 VITALS — BP 115/76 | HR 64 | Temp 98.0°F | Ht 67.0 in | Wt 243.0 lb

## 2021-11-30 DIAGNOSIS — K641 Second degree hemorrhoids: Secondary | ICD-10-CM

## 2021-11-30 DIAGNOSIS — K601 Chronic anal fissure: Secondary | ICD-10-CM | POA: Diagnosis not present

## 2021-11-30 NOTE — Patient Instructions (Signed)
[facility] [address], [place] [address].   Phone number [phone].   Please allow at least 24 hours before picking up the compounded cream because the pharmacy has to make the medication.  

## 2021-11-30 NOTE — Progress Notes (Signed)
PROCEDURE NOTE: ?The patient presents with symptomatic grade 2 hemorrhoids, unresponsive to maximal medical therapy, requesting rubber band ligation of his/her hemorrhoidal disease.  All risks, benefits and alternative forms of therapy were described and informed consent was obtained. ? ?The decision was made to band the LL internal hemorrhoid, and the [place] O?Regan System was used to perform band ligation without complication.  Digital anorectal examination was then performed to assure proper positioning of the band, and to adjust the banded tissue as required.  The patient was discharged home without pain or other issues.  Dietary and behavioral recommendations were given and (if necessary - prescriptions were given), along with follow-up instructions.  The patient will return as needed for follow-up and possible additional banding as required. ? ?No complications were encountered and the patient tolerated the procedure well. ? ? ?We will also treat the patient for anal fissure with 0.125% nitroglycerin and 5% lidocaine, instructions provided, reviewed the side effects.  She had point tenderness in the posterior wall of the anal canal on digital rectal exam today ? ?Cephas Darby, MD ?[facility], [address]  ?[address]  ?[place], [place] [address]  ?Main: [phone]  ?Fax: [phone] ?Pager: [phone] ? ? ? ?

## 2021-12-01 ENCOUNTER — Encounter: Payer: Self-pay | Admitting: Gastroenterology

## 2021-12-02 ENCOUNTER — Encounter: Payer: Self-pay | Admitting: Family Medicine

## 2021-12-07 ENCOUNTER — Encounter: Payer: Self-pay | Admitting: Family Medicine

## 2021-12-07 ENCOUNTER — Ambulatory Visit (INDEPENDENT_AMBULATORY_CARE_PROVIDER_SITE_OTHER): Payer: BC Managed Care – PPO | Admitting: Family Medicine

## 2021-12-07 VITALS — BP 124/68 | HR 64 | Temp 97.7°F | Ht 67.0 in | Wt 235.1 lb

## 2021-12-07 DIAGNOSIS — Z6837 Body mass index (BMI) 37.0-37.9, adult: Secondary | ICD-10-CM

## 2021-12-07 DIAGNOSIS — E6609 Other obesity due to excess calories: Secondary | ICD-10-CM | POA: Diagnosis not present

## 2021-12-07 MED ORDER — AMPHETAMINE-DEXTROAMPHETAMINE 30 MG PO TABS: 30.0000 mg | ORAL_TABLET | Freq: Two times a day (BID) | ORAL | 0 refills | Status: DC

## 2021-12-07 MED ORDER — TRIAMCINOLONE ACETONIDE 0.1 % EX CREA: 1.0000 "application " | TOPICAL_CREAM | Freq: Two times a day (BID) | CUTANEOUS | 3 refills | Status: AC | PRN

## 2021-12-07 MED ORDER — OZEMPIC (0.25 OR 0.5 MG/DOSE) 2 MG/1.5ML ~~LOC~~ SOPN: 0.2500 mg | PEN_INJECTOR | SUBCUTANEOUS | 1 refills | Status: DC

## 2021-12-07 NOTE — Assessment & Plan Note (Signed)
Over 50 lb lost so far with lifestyle change, commended! ?Hit a plateu  ?Interested in [employer] and ins may cover semaglutide  ? ?Disc poss side eff and contraindications  ?Info given ?Sent to pharm for 0.25 mg weekly- then reassess monthly  ?Expect prior auth for this  ?inst to avoid fasting if she takes this ?

## 2021-12-07 NOTE — Progress Notes (Signed)
? ?Subjective:  ? ? Patient ID: Brittney [name], female    DOB: [birth year], 41 y.o.   MRN: [id number] ? ?HPI ?Pt presents to discuss obesity  ? ?Wt Readings from Last 3 Encounters:  ?12/07/21 235 lb 2 oz (106.7 kg)  ?11/30/21 243 lb (110.2 kg)  ?11/02/21 246 lb 6 oz (111.8 kg)  ? ?36.83 kg/m? ? ?Interested in ozempic for wt loss  ?Insurance may cover it  ? ? ?Has a co working doing it ?She read up on it  ?Is interested  ? ?Has lost from 293 max  ?Doing great  ?235 lb today  ? ?Eats a lot of protein  ?Some fruit instead of sweets  ? ?Egg and apple in am  ? ?Roasted vegetables ?Chicken and fish  ?Portion sizes are controlled  ?No sugar drinks  ? ?At times intermittent fasting 16-18 hours  ?Occ 48 hour fast  ? ?Exercise - trainer for 3 d week ?Off days- walk or cardio at gym ?Uses apps on phone as well  ?At lunch she goes out to walk around the school, 1 mile   ? 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 HGBA1C 4.4 08/30/2021  ? ?No longer has IBS if she watches diet  ?No h/o pancreatitis ?No h/o thyroid trouble ?[place]  ? ?Patient Active Problem List  ? Diagnosis Date Noted  ? Lipid screening 08/30/2021  ? Diabetes mellitus screening 08/30/2021  ? Mixed incontinence 06/30/2021  ? Overactive bladder 06/30/2021  ? Tongue lesion 10/26/2020  ? ADD (attention deficit disorder) 07/30/2020  ? Social anxiety disorder 07/30/2020  ? Back pain 03/20/2020  ? Attention and concentration deficit 12/28/2019  ? Allergic rhinitis 12/11/2019  ? Ketosis ([place]) 10/16/2019  ? Thirst 10/16/2019  ? Vitamin D deficiency 09/16/2019  ? Mild reactive airways disease 03/20/2019  ? Hot flashes 02/20/2019  ? Constipation 02/20/2019  ? Fatigue 02/20/2019  ? Right knee pain 10/17/2018  ? Dysuria 06/06/2018  ? Benign paroxysmal positional vertigo 05/16/2018  ? Postoperative state 01/03/2018  ? Lipoma of back 10/04/2017  ? Routine general medical examination at a health care facility 08/29/2017  ? Fibrocystic  breast changes 01/08/2016  ? Hemorrhoids, external 09/30/2014  ? 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 Hemorrhoids, internal, with bleeding 04/22/2014  ? Family history of pulmonary embolism 09/02/2013  ? Family history of diabetes mellitus 09/02/2013  ? Cutaneous skin tags 05/15/2013  ? 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 Vaginal discharge 12/20/2010  ? Class 2 obesity due to excess calories without serious comorbidity with body mass index (BMI) of 37.0 to 37.9 in adult 03/31/2008  ? HEARING LOSS, MILD 03/31/2008  ? MIGRAINE HEADACHE 02/20/2007  ? IBS 02/20/2007  ? OSTEOARTHRITIS 02/20/2007  ? ?Past Medical History:  ?Diagnosis Date  ? ADHD   ? Anemia   ? B12 deficiency   ? Back pain   ? Colon polyps   ? Coronary artery abnormality   ? spasms   ? 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 reports saw cardiologist for chest pains , was told she was having coronary artery spasms , sent for cardio w/u with stress and echo  both unremarkable. reports today has not had any spasms or chest pains in over 2 years    ? Fibroids   ? Gallstones   ? Irritable bowel syndrome   ? Joint pain   ? Lactose intolerance   ? Migraine   ? hasnt  had one in a long time  ? Obesity, unspecified   ? Vitamin D deficiency   ? ?Past Surgical History:  ?Procedure Laterality Date  ? CHOLECYSTECTOMY  05/06/11  ? COLONOSCOPY  11/2001  ? polyp; negative pathology  ? COLONOSCOPY  09/2004  ? Negative  ? COLONOSCOPY  2017 or 2018 patient  unsure  ? [facility] ;   ? COLONOSCOPY W/ POLYPECTOMY    ? CYSTOSCOPY N/A 01/03/2018  ? Procedure: CYSTOSCOPY;  Surgeon: Bobbye Charleston, MD;  Location: [facility];  Service: Gynecology;  Laterality: N/A;  ? ESOPHAGOGASTRODUODENOSCOPY  1/10  ? normal  ? FLEXIBLE SIGMOIDOSCOPY  03/05/2012  ? Procedure: FLEXIBLE SIGMOIDOSCOPY;  Surgeon: Inda Castle, MD;  Location: [facility];  Service: Endoscopy;  Laterality: N/A;  ? LAPAROSCOPIC DECORTICATION / DUBULKING / ABLATION RENAL CYSTS    ? saline histogram  ? ROBOTIC ASSISTED  LAPAROSCOPIC HYSTERECTOMY AND SALPINGECTOMY Bilateral 01/03/2018  ? Procedure: XI ROBOTIC ASSISTED LAPAROSCOPIC HYSTERECTOMY AND SALPINGECTOMY;  Surgeon: Bobbye Charleston, MD;  Location: [facility];  Service: Gynecology;  Laterality: Bilateral;  WITH BED AFTER  ? TONSILLECTOMY  07/30/07  ? Uterine US  09/2005  ? Large endometrial stripe; small ovarian cyst  ? ?Social History  ? ?Tobacco Use  ? Smoking status: Never  ? Smokeless tobacco: Never  ?Vaping Use  ? Vaping Use: Never used  ?Substance Use Topics  ? Alcohol use: Yes  ?  Comment: rare  ? Drug use: No  ? ?Family History  ?Problem Relation Age of Onset  ? Heart disease Father   ? Hypertension Father   ? Heart attack Father   ? Diabetes Father   ? Sleep apnea Mother   ? Obesity Mother   ? Ulcers Brother   ? Clotting disorder Brother   ? Ovarian cancer Maternal Grandmother   ? Colon polyps Maternal Grandmother   ? Colon cancer Maternal Grandmother   ?     40's  ? Diabetes Paternal Grandmother   ? Hypertension Paternal Grandmother   ? Breast cancer Maternal Aunt   ?     Age 43's  ? Diabetes Paternal Aunt   ?     x 5  ? Lung cancer Maternal Grandfather   ?     SMOKER  ? ?Allergies  ?Allergen Reactions  ? Prevacid [Lansoprazole] Rash  ? ?Current Outpatient Medications on File Prior to Visit  ?Medication Sig Dispense Refill  ? cyclobenzaprine (FLEXERIL) 10 MG tablet Take 1 tablet (10 mg total) by mouth 2 (two) times daily as needed for muscle spasms. 20 tablet 0  ? fluticasone (FLONASE) 50 MCG/ACT nasal spray PLACE 2 SPRAYS INTO BOTH NOSTRILS DAILY AS NEEDED (FOR ALLERGIES.). 48 mL 1  ? levocetirizine (XYZAL) 5 MG tablet Take 1 tablet (5 mg total) by mouth every evening. 90 tablet 3  ? montelukast (SINGULAIR) 10 MG tablet TAKE 1 TABLET BY MOUTH EVERYDAY AT BEDTIME 90 tablet 1  ? triamcinolone (KENALOG) 0.1 % paste Use as directed 1 application in the mouth or throat 2 (two) times daily. To affected area 5 g 1  ? ?No current facility-administered medications on file prior  to visit.  ?  ?Review of Systems  ?Constitutional:  Negative for activity change, appetite change, fatigue, fever and unexpected weight change.  ?HENT:  Negative for congestion, ear pain, rhinorrhea, sinus pressure and sore throat.   ?Eyes:  Negative for pain, redness and visual disturbance.  ?Respiratory:  Negative for cough, shortness of breath and wheezing.   ?  Cardiovascular:  Negative for chest pain and palpitations.  ?Gastrointestinal:  Negative for abdominal pain, blood in stool, constipation and diarrhea.  ?Endocrine: Negative for polydipsia and polyuria.  ?Genitourinary:  Negative for dysuria, frequency and urgency.  ?Musculoskeletal:  Negative for arthralgias, back pain and myalgias.  ?Skin:  Negative for pallor and rash.  ?Allergic/Immunologic: Negative for environmental allergies.  ?Neurological:  Negative for dizziness, syncope and headaches.  ?Hematological:  Negative for adenopathy. Does not bruise/bleed easily.  ?Psychiatric/Behavioral:  Negative for decreased concentration and dysphoric mood. The patient is not nervous/anxious.   ? ?   ?Objective:  ? Physical Exam ?Constitutional:   ?   General: She is not in acute distress. ?   Appearance: Normal appearance. She is well-developed. She is obese. She is not ill-appearing or diaphoretic.  ?HENT:  ?   Head: Normocephalic and atraumatic.  ?Eyes:  ?   Conjunctiva/sclera: Conjunctivae normal.  ?   Pupils: Pupils are equal, round, and reactive to light.  ?Neck:  ?   Thyroid: No thyromegaly.  ?   Vascular: No carotid bruit or JVD.  ?Cardiovascular:  ?   Rate and Rhythm: Normal rate and regular rhythm.  ?   Heart sounds: Normal heart sounds.  ?  No gallop.  ?Pulmonary:  ?   Effort: Pulmonary effort is normal. No respiratory distress.  ?   Breath sounds: Normal breath sounds. No wheezing or rales.  ?Abdominal:  ?   General: There is no distension or abdominal bruit.  ?   Palpations: Abdomen is soft.  ?Musculoskeletal:  ?   Cervical back: Normal range of motion  and neck supple.  ?   Right lower leg: No edema.  ?   Left lower leg: No edema.  ?Lymphadenopathy:  ?   Cervical: No cervical adenopathy.  ?Skin: ?   General: Skin is warm and dry.  ?   Coloration: Skin is no

## 2021-12-07 NOTE — Patient Instructions (Signed)
Keep up the good work with healthy diet and exercise  ? ?I will send the generic ozempic to pharmacy  ?After a month of taking it we may consider increase in dose ?If any intolerable side effects hold it and let me know  ?Here is a handout  ? ? ?When on this medicine fasting is not recommended  ? ? ? ? ?

## 2021-12-13 ENCOUNTER — Encounter: Payer: Self-pay | Admitting: Gastroenterology

## 2021-12-20 ENCOUNTER — Encounter: Payer: Self-pay | Admitting: Family Medicine

## 2021-12-21 NOTE — Telephone Encounter (Signed)
Called patient only wanted to see Tower app made for tomorrow.  ?

## 2021-12-22 ENCOUNTER — Encounter: Payer: Self-pay | Admitting: Family Medicine

## 2021-12-22 ENCOUNTER — Ambulatory Visit (INDEPENDENT_AMBULATORY_CARE_PROVIDER_SITE_OTHER): Payer: BC Managed Care – PPO | Admitting: Family Medicine

## 2021-12-22 DIAGNOSIS — L304 Erythema intertrigo: Secondary | ICD-10-CM | POA: Insufficient documentation

## 2021-12-22 MED ORDER — KETOCONAZOLE 2 % EX CREA
1.0000 | TOPICAL_CREAM | Freq: Every day | CUTANEOUS | 1 refills | Status: DC
Start: 2021-12-22 — End: 2023-07-03

## 2021-12-22 NOTE — Patient Instructions (Signed)
Dry very well after bathing (use a hair dryer on cool setting) ?Then use the ketoconazole cream daily  ? ?Stay dry when you can ?Wear breathable garments when you can  ? ?Update if not starting to improve in a week or if worsening   ? ? ?

## 2021-12-22 NOTE — Assessment & Plan Note (Addendum)
Under breasts and under pannus on L  ?With wt loss/has loose skin making this worse ?Pt works out/sweats routinely  ? ?inst to keep area clean and clothing light /breathable if able  ?Dry with hair dryer/cool if needed  ?Ketoconazole 2% daily-trial for 10-14 d and update  ?Update if not starting to improve in a week or if worsening   ?Handout given  ?

## 2021-12-22 NOTE — Progress Notes (Signed)
? ?Subjective:  ? ? Patient ID: Brittney [name], female    DOB: 20-Jun-1981, 41 y.o.   MRN: [id number] ? ?HPI ?Pt presents for c/o rash  ? ?Wt Readings from Last 3 Encounters:  ?12/22/21 232 lb 6.4 oz (105.4 kg)  ?12/07/21 235 lb 2 oz (106.7 kg)  ?11/30/21 243 lb (110.2 kg)  ? ?36.40 kg/m? ? ? ?Rash  ?Under breasts and abdomen (on the R side)  ?Burning and dry   ?Not very itchy  ?Never had it before  ?She had used some nair at one time and it may have started some of the irritation ?Not putting anything on it  ? ?Used dial soap for years/body wash  ? ? ?She works out  ?Takes her shower immed after  ? ?Stays irritated under pannus ?Working on weight loss  ?Some skin folds remain  ? ?Patient Active Problem List  ? Diagnosis Date Noted  ? Intertrigo 12/22/2021  ? Lipid screening 08/30/2021  ? Diabetes mellitus screening 08/30/2021  ? Mixed incontinence 06/30/2021  ? Overactive bladder 06/30/2021  ? Tongue lesion 10/26/2020  ? ADD (attention deficit disorder) 07/30/2020  ? Social anxiety disorder 07/30/2020  ? Back pain 03/20/2020  ? Attention and concentration deficit 12/28/2019  ? Allergic rhinitis 12/11/2019  ? 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 Thirst 10/16/2019  ? Vitamin D deficiency 09/16/2019  ? Mild reactive airways disease 03/20/2019  ? Hot flashes 02/20/2019  ? Constipation 02/20/2019  ? Fatigue 02/20/2019  ? Right knee pain 10/17/2018  ? Dysuria 06/06/2018  ? Benign paroxysmal positional vertigo 05/16/2018  ? Postoperative state 01/03/2018  ? Lipoma of back 10/04/2017  ? Routine general medical examination at a health care facility 08/29/2017  ? Fibrocystic breast changes 01/08/2016  ? Hemorrhoids, external 09/30/2014  ? 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 Hemorrhoids, internal, with bleeding 04/22/2014  ? Family history of pulmonary embolism 09/02/2013  ? Family history of diabetes mellitus 09/02/2013  ? Cutaneous skin tags 05/15/2013  ? 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 Vaginal discharge 12/20/2010   ? Class 2 obesity due to excess calories without serious comorbidity with body mass index (BMI) of 37.0 to 37.9 in adult 03/31/2008  ? HEARING LOSS, MILD 03/31/2008  ? MIGRAINE HEADACHE 02/20/2007  ? IBS 02/20/2007  ? OSTEOARTHRITIS 02/20/2007  ? ?Past Medical History:  ?Diagnosis Date  ? ADHD   ? Anemia   ? B12 deficiency   ? Back pain   ? Colon polyps   ? Coronary artery abnormality   ? spasms   ? 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 reports saw cardiologist for chest pains , was told she was having coronary artery spasms , sent for cardio w/u with stress and echo  both unremarkable. reports today has not had any spasms or chest pains in over 2 years    ? Fibroids   ? Gallstones   ? Irritable bowel syndrome   ? Joint pain   ? Lactose intolerance   ? Migraine   ? hasnt had one in a long time  ? Obesity, unspecified   ? Vitamin D deficiency   ? ?Past Surgical History:  ?Procedure Laterality Date  ? CHOLECYSTECTOMY  05/06/11  ? COLONOSCOPY  11/2001  ? polyp; negative pathology  ? COLONOSCOPY  09/2004  ? Negative  ? COLONOSCOPY  2017 or 2018 patient  unsure  ? [facility] ;   ? COLONOSCOPY W/ POLYPECTOMY    ? CYSTOSCOPY N/A 01/03/2018  ? Procedure:  CYSTOSCOPY;  Surgeon: Bobbye Charleston, MD;  Location: [facility];  Service: Gynecology;  Laterality: N/A;  ? ESOPHAGOGASTRODUODENOSCOPY  1/10  ? normal  ? FLEXIBLE SIGMOIDOSCOPY  03/05/2012  ? Procedure: FLEXIBLE SIGMOIDOSCOPY;  Surgeon: Inda Castle, MD;  Location: [facility];  Service: Endoscopy;  Laterality: N/A;  ? LAPAROSCOPIC DECORTICATION / DUBULKING / ABLATION RENAL CYSTS    ? saline histogram  ? ROBOTIC ASSISTED LAPAROSCOPIC HYSTERECTOMY AND SALPINGECTOMY Bilateral 01/03/2018  ? Procedure: XI ROBOTIC ASSISTED LAPAROSCOPIC HYSTERECTOMY AND SALPINGECTOMY;  Surgeon: Bobbye Charleston, MD;  Location: [facility];  Service: Gynecology;  Laterality: Bilateral;  WITH BED AFTER  ? TONSILLECTOMY  07/30/07  ? Uterine US  09/2005  ? Large endometrial stripe; small ovarian cyst  ? ?Social  History  ? ?Tobacco Use  ? Smoking status: Never  ? Smokeless tobacco: Never  ?Vaping Use  ? Vaping Use: Never used  ?Substance Use Topics  ? Alcohol use: Yes  ?  Comment: rare  ? Drug use: No  ? ?Family History  ?Problem Relation Age of Onset  ? Heart disease Father   ? Hypertension Father   ? Heart attack Father   ? Diabetes Father   ? Sleep apnea Mother   ? Obesity Mother   ? Ulcers Brother   ? Clotting disorder Brother   ? Ovarian cancer Maternal Grandmother   ? Colon polyps Maternal Grandmother   ? Colon cancer Maternal Grandmother   ?     40's  ? Diabetes Paternal Grandmother   ? Hypertension Paternal Grandmother   ? Breast cancer Maternal Aunt   ?     Age 55's  ? Diabetes Paternal Aunt   ?     x 5  ? Lung cancer Maternal Grandfather   ?     SMOKER  ? ?Allergies  ?Allergen Reactions  ? Prevacid [Lansoprazole] Rash  ? ?Current Outpatient Medications on File Prior to Visit  ?Medication Sig Dispense Refill  ? amphetamine-dextroamphetamine (ADDERALL) 30 MG tablet Take 1 tablet by mouth 2 (two) times daily. 60 tablet 0  ? cyclobenzaprine (FLEXERIL) 10 MG tablet Take 1 tablet (10 mg total) by mouth 2 (two) times daily as needed for muscle spasms. 20 tablet 0  ? fluticasone (FLONASE) 50 MCG/ACT nasal spray PLACE 2 SPRAYS INTO BOTH NOSTRILS DAILY AS NEEDED (FOR ALLERGIES.). 48 mL 1  ? levocetirizine (XYZAL) 5 MG tablet Take 1 tablet (5 mg total) by mouth every evening. 90 tablet 3  ? montelukast (SINGULAIR) 10 MG tablet TAKE 1 TABLET BY MOUTH EVERYDAY AT BEDTIME 90 tablet 1  ? Semaglutide,0.25 or 0.'5MG'$ /DOS, (OZEMPIC, 0.25 OR 0.5 MG/DOSE,) 2 MG/1.5ML SOPN Inject 0.25 mg into the skin once a week. 3 mL 1  ? triamcinolone (KENALOG) 0.1 % paste Use as directed 1 application in the mouth or throat 2 (two) times daily. To affected area 5 g 1  ? triamcinolone cream (KENALOG) 0.1 % Apply 1 application. topically 2 (two) times daily as needed (for Eczema). 30 g 3  ? ?No current facility-administered medications on file prior  to visit.  ?  ? ?Review of Systems  ?Constitutional:  Negative for activity change, appetite change, fatigue, fever and unexpected weight change.  ?HENT:  Negative for congestion, ear pain, rhinorrhea, sinus pressure and sore throat.   ?Eyes:  Negative for pain, redness and visual disturbance.  ?Respiratory:  Negative for cough, shortness of breath and wheezing.   ?Cardiovascular:  Negative for chest pain and palpitations.  ?Gastrointestinal:  Negative for  abdominal pain, blood in stool, constipation and diarrhea.  ?Endocrine: Negative for polydipsia and polyuria.  ?Genitourinary:  Negative for dysuria, frequency and urgency.  ?Musculoskeletal:  Negative for arthralgias, back pain and myalgias.  ?Skin:  Positive for rash. Negative for pallor and wound.  ?Allergic/Immunologic: Negative for environmental allergies.  ?Neurological:  Negative for dizziness, syncope and headaches.  ?Hematological:  Negative for adenopathy. Does not bruise/bleed easily.  ?Psychiatric/Behavioral:  Negative for decreased concentration and dysphoric mood. The patient is not nervous/anxious.   ? ?   ?Objective:  ? Physical Exam ?Constitutional:   ?   Appearance: Normal appearance. She is obese. She is not ill-appearing.  ?HENT:  ?   Head: Atraumatic.  ?Eyes:  ?   Conjunctiva/sclera: Conjunctivae normal.  ?   Pupils: Pupils are equal, round, and reactive to light.  ?Cardiovascular:  ?   Rate and Rhythm: Regular rhythm.  ?Pulmonary:  ?   Effort: Pulmonary effort is normal.  ?   Breath sounds: Normal breath sounds.  ?Musculoskeletal:  ?   Cervical back: No tenderness.  ?Lymphadenopathy:  ?   Cervical: No cervical adenopathy.  ?Skin: ?   General: Skin is warm.  ?   Coloration: Skin is not pale.  ?   Findings: Erythema and rash present.  ?   Comments: Rash under both breasts and pannus on L  ?Hyperpigmentation / mild erythema and some satellite lesions ?No pustules ?No excoriations or skin breakdown   ?Neurological:  ?   Mental Status: She is  alert.  ?Psychiatric:     ?   Mood and Affect: Mood normal.  ? ? ? ? ? ?   ?Assessment & Plan:  ? ?Problem List Items Addressed This Visit   ? ?  ? Musculoskeletal and Integument  ? Intertrigo  ?  Under br

## 2021-12-23 ENCOUNTER — Telehealth: Payer: Self-pay | Admitting: *Deleted

## 2021-12-23 NOTE — Telephone Encounter (Signed)
PA was done at [email or web address] for pt's semaglutide I will await a response  ?

## 2021-12-27 ENCOUNTER — Telehealth: Payer: Self-pay

## 2021-12-27 ENCOUNTER — Telehealth: Payer: Self-pay | Admitting: Gastroenterology

## 2021-12-27 NOTE — Telephone Encounter (Signed)
Patient verbalized understanding of instructions  

## 2021-12-27 NOTE — Telephone Encounter (Signed)
Sent medication to pharmacy and informed patient of instructions  ?

## 2021-12-27 NOTE — Telephone Encounter (Signed)
Patient states she needs a refilled on the compounded medication  ?

## 2021-12-27 NOTE — Telephone Encounter (Signed)
Recommend to refill 0.125% nitroglycerin with 5% lidocaine ?Also, try MiraLAX 2 capfuls daily for constipation ? ?RV ?

## 2021-12-27 NOTE — Telephone Encounter (Signed)
Patient sent a mychart message that she is due for a colonoscopy and wants to schedule. Patient is only 40 not 41 ?

## 2021-12-27 NOTE — Telephone Encounter (Signed)
I have discussed with her a few times in the past that she does not need a colonoscopy at this time.  I reviewed her previous colonoscopy reports as well.  She can wait until she turns 67 unless she has any new family history of colon cancer ? ?RV ?

## 2021-12-27 NOTE — Telephone Encounter (Signed)
PT states that she is still having a lot of soreness and not having complete bowel movements. She needs refill on medication that helps but cannot remember the name  this has been going on since her banding procedure ?

## 2021-12-29 ENCOUNTER — Encounter: Payer: Self-pay | Admitting: Gastroenterology

## 2021-12-29 ENCOUNTER — Encounter: Payer: Self-pay | Admitting: Family Medicine

## 2021-12-29 DIAGNOSIS — K582 Mixed irritable bowel syndrome: Secondary | ICD-10-CM

## 2021-12-29 MED ORDER — POLYETHYLENE GLYCOL 3350 17 GM/SCOOP PO POWD: 17.0000 g | Freq: Every day | ORAL | 11 refills | Status: DC

## 2021-12-29 NOTE — Telephone Encounter (Signed)
Can I send this in  ?

## 2021-12-29 NOTE — Assessment & Plan Note (Signed)
miralax sent for pharmacy to use daily for constipation  ?

## 2022-01-11 ENCOUNTER — Other Ambulatory Visit: Payer: Self-pay | Admitting: Family Medicine

## 2022-01-11 ENCOUNTER — Encounter: Payer: Self-pay | Admitting: Gastroenterology

## 2022-01-12 ENCOUNTER — Telehealth: Payer: Self-pay | Admitting: Gastroenterology

## 2022-01-12 ENCOUNTER — Encounter: Payer: Self-pay | Admitting: Family Medicine

## 2022-01-12 MED ORDER — OZEMPIC (0.25 OR 0.5 MG/DOSE) 2 MG/1.5ML ~~LOC~~ SOPN
0.5000 mg | PEN_INJECTOR | SUBCUTANEOUS | 1 refills | Status: DC
Start: 2022-01-12 — End: 2022-02-04

## 2022-01-12 NOTE — Telephone Encounter (Signed)
Have sent a message to Dr. Marius Ditch

## 2022-01-12 NOTE — Telephone Encounter (Signed)
Patient called informing us to call her back at her work because he cell will be off.   Work: [phone] [employer] for [employer]

## 2022-01-12 NOTE — Telephone Encounter (Signed)
Please advise if I can double book her somewhere or what you recommend

## 2022-01-13 ENCOUNTER — Ambulatory Visit: Payer: BC Managed Care – PPO | Admitting: Gastroenterology

## 2022-01-13 ENCOUNTER — Other Ambulatory Visit: Payer: Self-pay

## 2022-01-13 ENCOUNTER — Encounter: Payer: Self-pay | Admitting: Gastroenterology

## 2022-01-13 VITALS — BP 121/77 | HR 75 | Temp 98.5°F | Ht 67.0 in | Wt 227.5 lb

## 2022-01-13 DIAGNOSIS — K6289 Other specified diseases of anus and rectum: Secondary | ICD-10-CM

## 2022-01-13 DIAGNOSIS — R194 Change in bowel habit: Secondary | ICD-10-CM | POA: Diagnosis not present

## 2022-01-13 MED ORDER — NA SULFATE-K SULFATE-MG SULF 17.5-3.13-1.6 GM/177ML PO SOLN: 354.0000 mL | Freq: Once | ORAL | 0 refills | Status: AC

## 2022-01-13 NOTE — Patient Instructions (Signed)
We will send all of your prescriptions to [facility]. Phone: [phone]  Address:  [address] [place],  [address]

## 2022-01-13 NOTE — Progress Notes (Signed)
Cephas Darby, MD [address]  [place]  [place], [place] [address]  Main: [phone]  Fax: [phone]    Gastroenterology Consultation  Referring Provider:     Abner Greenspan, MD Primary Care Physician:  Tower, Wynelle Fanny, MD Primary Gastroenterologist:  Dr. Cephas Darby Reason for Consultation: Rectal pain, change in bowel habits        HPI:   Brittney [name] is a 41 y.o. female referred by Dr. Glori Bickers, Wynelle Fanny, MD  for consultation & management of symptomatic hemorrhoids.  Patient reports that she was experiencing alternating constipation and diarrhea, however she has been watching her diet and her GI symptoms have significantly improved.  She is no longer experiencing constipation.  She reports her bowel movements on Bristol stool scale as 4.  She spends about 15 minutes on the toilet daily.  She denies any abdominal pain, abdominal bloating.  Her hemorrhoidal symptoms include rectal bleeding, rectal pressure/discomfort, prolapse, itching.  She underwent several colonoscopies, last one in 2018, it was normal except for hemorrhoids.  She has family history of colon cancer.  No history of anemia.  Patient has tried several over-the-counter hemorrhoidal creams which did not provide any relief.  Her hemorrhoidal symptoms have been persistent for last 4 months, progressively worse.  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  She lost about 70 pounds altogether.  Follow-up visit 01/13/2022 Patient reports that she has been experiencing severe rectal pain, radiating down to bilateral thighs, low back pain.  She reports that her bowel movements have been string-like, thin caliber.  She denies any rectal bleeding.  She ran out of topical nitroglycerin.  She is interested to undergo colonoscopy because her last colonoscopy was 5 years ago.  NSAIDs: None  Antiplts/Anticoagulants/Anti thrombotics: None  GI Procedures:   Colonoscopy 10/05/2016 - The perianal and digital rectal examinations were normal. - Non-bleeding internal hemorrhoids were found during retroflexion. The hemorrhoids were small. - The exam was otherwise without abnormality.  Flexible sigmoidoscopy FINAL DIAGNOSIS Diagnosis 1. Surgical [P], descending, polyp - TUBULAR ADENOMA. - HIGH GRADE DYSPLASIA IS NOT IDENTIFIED. 2. Surgical [P], rectum, bx - BENIGN COLORECTAL MUCOSA WITH SCATTERED LYMPHOID AGGREGATES. - THERE IS NO EVIDENCE OF SIGNIFICANT ACTIVE COLITIS, IDIOPATHIC INFLAMMATION BOWEL DISEASE, DYSPLASIA, OR MALIGNANCY.  Past Medical History:  Diagnosis Date   ADHD    Anemia    B12 deficiency    Back pain    Colon polyps    Coronary artery abnormality    spasms    Coronary artery spasm [facility])    reports saw cardiologist for chest pains , was told she was having coronary artery spasms , sent for cardio w/u with stress and echo  both unremarkable. reports today has not had any spasms or chest pains in over 2 years     Fibroids    Gallstones    Irritable bowel syndrome    Joint pain    Lactose intolerance    Migraine    hasnt had one in a long time   Obesity, unspecified    Vitamin D deficiency     Past Surgical History:  Procedure Laterality Date   CHOLECYSTECTOMY  05/06/11   COLONOSCOPY  11/2001   polyp; negative pathology   COLONOSCOPY  09/2004   Negative   COLONOSCOPY  2017 or 2018 patient  unsure   [facility] ;    COLONOSCOPY W/ POLYPECTOMY  CYSTOSCOPY N/A 01/03/2018   Procedure: CYSTOSCOPY;  Surgeon: Bobbye Charleston, MD;  Location: [facility];  Service: Gynecology;  Laterality: N/A;   ESOPHAGOGASTRODUODENOSCOPY  1/10   normal   FLEXIBLE SIGMOIDOSCOPY  03/05/2012   Procedure: FLEXIBLE SIGMOIDOSCOPY;  Surgeon: Inda Castle, MD;  Location: [facility];  Service: Endoscopy;  Laterality: N/A;   LAPAROSCOPIC DECORTICATION / DUBULKING / ABLATION RENAL CYSTS     saline histogram   ROBOTIC ASSISTED LAPAROSCOPIC  HYSTERECTOMY AND SALPINGECTOMY Bilateral 01/03/2018   Procedure: XI ROBOTIC ASSISTED LAPAROSCOPIC HYSTERECTOMY AND SALPINGECTOMY;  Surgeon: Bobbye Charleston, MD;  Location: [facility];  Service: Gynecology;  Laterality: Bilateral;  WITH BED AFTER   TONSILLECTOMY  07/30/07   Uterine US  09/2005   Large endometrial stripe; small ovarian cyst   Current Outpatient Medications:    amphetamine-dextroamphetamine (ADDERALL) 30 MG tablet, Take 1 tablet by mouth 2 (two) times daily., Disp: 60 tablet, Rfl: 0   cyclobenzaprine (FLEXERIL) 10 MG tablet, Take 1 tablet (10 mg total) by mouth 2 (two) times daily as needed for muscle spasms., Disp: 20 tablet, Rfl: 0   fluticasone (FLONASE) 50 MCG/ACT nasal spray, PLACE 2 SPRAYS INTO BOTH NOSTRILS DAILY AS NEEDED (FOR ALLERGIES.)., Disp: 48 mL, Rfl: 1   ketoconazole (NIZORAL) 2 % cream, Apply 1 application. topically daily. Apply to affected areas (rash) once daily after bathing and drying well, Disp: 30 g, Rfl: 1   levocetirizine (XYZAL) 5 MG tablet, Take 1 tablet (5 mg total) by mouth every evening., Disp: 90 tablet, Rfl: 3   montelukast (SINGULAIR) 10 MG tablet, TAKE 1 TABLET BY MOUTH EVERYDAY AT BEDTIME, Disp: 90 tablet, Rfl: 1   polyethylene glycol powder (GLYCOLAX/MIRALAX) 17 GM/SCOOP powder, Take 17 g by mouth daily., Disp: 3350 g, Rfl: 11   Semaglutide,0.25 or 0.'5MG'$ /DOS, (OZEMPIC, 0.25 OR 0.5 MG/DOSE,) 2 MG/1.5ML SOPN, Inject 0.5 mg into the skin once a week., Disp: 3 mL, Rfl: 1   triamcinolone (KENALOG) 0.1 % paste, Use as directed 1 application in the mouth or throat 2 (two) times daily. To affected area, Disp: 5 g, Rfl: 1   triamcinolone cream (KENALOG) 0.1 %, Apply 1 application. topically 2 (two) times daily as needed (for Eczema)., Disp: 30 g, Rfl: 3   Na Sulfate-K Sulfate-Mg Sulf 17.5-3.13-1.6 GM/177ML SOLN, Take 354 mLs by mouth once for 1 dose., Disp: 354 mL, Rfl: 0  Family History  Problem Relation Age of Onset   Heart disease Father    Hypertension  Father    Heart attack Father    Diabetes Father    Sleep apnea Mother    Obesity Mother    Ulcers Brother    Clotting disorder Brother    Ovarian cancer Maternal Grandmother    Colon polyps Maternal Grandmother    Colon cancer Maternal Grandmother        40's   Diabetes Paternal Grandmother    Hypertension Paternal Grandmother    Breast cancer Maternal Aunt        Age 76's   Diabetes Paternal Aunt        x 5   Lung cancer Maternal Grandfather        SMOKER     Social History   Tobacco Use   Smoking status: Never   Smokeless tobacco: Never  Vaping Use   Vaping Use: Never used  Substance Use Topics   Alcohol use: Yes    Comment: rare   Drug use: No    Allergies as of 01/13/2022 - Review  Complete 01/13/2022  Allergen Reaction Noted   Prevacid [lansoprazole] Rash 02/20/2007    Review of Systems:    All systems reviewed and negative except where noted in HPI.   Physical Exam:  BP 121/77 (BP Location: Left Arm, Patient Position: Sitting, Cuff Size: Normal)   Pulse 75   Temp 98.5 F (36.9 C) (Oral)   Ht '5\' 7"'$  (1.702 m)   Wt 227 lb 8 oz (103.2 kg)   LMP 12/21/2017   BMI 35.63 kg/m  Patient's last menstrual period was 12/21/2017.  General:   Alert,  Well-developed, well-nourished, pleasant and cooperative in NAD Head:  Normocephalic and atraumatic. Eyes:  Sclera clear, no icterus.   Conjunctiva pink. Ears:  Normal auditory acuity. Nose:  No deformity, discharge, or lesions. Mouth:  No deformity or lesions,oropharynx pink & moist. Neck:  Supple; no masses or thyromegaly. Lungs:  Respirations even and unlabored.  Clear throughout to auscultation.   No wheezes, crackles, or rhonchi. No acute distress. Heart:  Regular rate and rhythm; no murmurs, clicks, rubs, or gallops. Abdomen:  Normal bowel sounds. Soft, non-tender and non-distended without masses, hepatosplenomegaly or hernias noted.  No guarding or rebound tenderness.   Rectal: Small perianal skin tags, point  tenderness in the posterior wall of the anal canal, no palpable mass appreciated Msk:  Symmetrical without gross deformities. Good, equal movement & strength bilaterally. Pulses:  Normal pulses noted. Extremities:  No clubbing or edema.  No cyanosis. Neurologic:  Alert and oriented x3;  grossly normal neurologically. Skin:  Intact without significant lesions or rashes. No jaundice. Psych:  Alert and cooperative. Normal mood and affect.  Imaging Studies: Reviewed No abdominal imaging  Assessment and Plan:   Brittney [name] is a 41 y.o. African-American female with grade 2 symptomatic hemorrhoids, s/p ligation of right anterior, right posterior and left lateral hemorrhoids.  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  She now has recurrence of rectal pressure/pain and change in stool caliber.  She is no longer experiencing constipation.  Patient is interested to undergo colonoscopy since her last colonoscopy was 5 years ago.  I tried to explain to her that rectal pain is most likely secondary to recurrence of anal fissure.  She is persistent about undergoing diagnostic colonoscopy.    Posterior anal fissure Recommend topical nitroglycerin 0.25% with 5% lidocaine, instructions provided  Change in bowel habits and rectal pressure Recommend diagnostic colonoscopy  I have discussed alternative options, risks & benefits,  which include, but are not limited to, bleeding, infection, perforation,respiratory complication & drug reaction.  The patient agrees with this plan & written consent will be obtained.     Follow up based on the above work-up   Cephas Darby, MD

## 2022-01-18 ENCOUNTER — Encounter: Payer: Self-pay | Admitting: Gastroenterology

## 2022-01-18 DIAGNOSIS — K601 Chronic anal fissure: Secondary | ICD-10-CM

## 2022-01-18 DIAGNOSIS — K6289 Other specified diseases of anus and rectum: Secondary | ICD-10-CM

## 2022-01-19 NOTE — Telephone Encounter (Signed)
Place referral to general surgery. We I go to order the 2 percent nicardipine cream  I do not see that as a order

## 2022-01-24 ENCOUNTER — Encounter: Payer: Self-pay | Admitting: Gastroenterology

## 2022-01-27 ENCOUNTER — Encounter: Payer: Self-pay | Admitting: Gastroenterology

## 2022-01-27 ENCOUNTER — Encounter
Admission: RE | Disposition: A | Discharge status: Home / Self Care | Payer: Self-pay | Source: Ambulatory Visit | Attending: Gastroenterology

## 2022-01-27 ENCOUNTER — Ambulatory Visit: Payer: BC Managed Care – PPO | Admitting: Anesthesiology

## 2022-01-27 ENCOUNTER — Ambulatory Visit
Admission: RE | Admit: 2022-01-27 | Discharge: 2022-01-27 | Disposition: A | Discharge status: Home / Self Care | Payer: BC Managed Care – PPO | Source: Ambulatory Visit | Attending: Gastroenterology | Admitting: Gastroenterology

## 2022-01-27 ENCOUNTER — Other Ambulatory Visit: Payer: Self-pay

## 2022-01-27 DIAGNOSIS — K644 Residual hemorrhoidal skin tags: Secondary | ICD-10-CM | POA: Insufficient documentation

## 2022-01-27 DIAGNOSIS — R194 Change in bowel habit: Secondary | ICD-10-CM | POA: Insufficient documentation

## 2022-01-27 DIAGNOSIS — R195 Other fecal abnormalities: Secondary | ICD-10-CM | POA: Insufficient documentation

## 2022-01-27 DIAGNOSIS — K6289 Other specified diseases of anus and rectum: Secondary | ICD-10-CM | POA: Insufficient documentation

## 2022-01-27 DIAGNOSIS — E6609 Other obesity due to excess calories: Secondary | ICD-10-CM

## 2022-01-27 HISTORY — DX: Nausea with vomiting, unspecified: R11.2

## 2022-01-27 HISTORY — PX: COLONOSCOPY WITH PROPOFOL: SHX5780

## 2022-01-27 HISTORY — DX: Other specified postprocedural states: Z98.890

## 2022-01-27 SURGERY — COLONOSCOPY WITH PROPOFOL
Anesthesia: General | Site: Rectum

## 2022-01-27 MED ORDER — LACTATED RINGERS IV SOLN: INTRAVENOUS | Status: DC

## 2022-01-27 MED ORDER — PROPOFOL 10 MG/ML IV BOLUS
INTRAVENOUS | Status: DC | PRN
  Administered 2022-01-27 (×2): 40 mg via INTRAVENOUS
  Administered 2022-01-27: 80 mg via INTRAVENOUS
  Administered 2022-01-27 (×2): 40 mg via INTRAVENOUS
  Administered 2022-01-27: 50 mg via INTRAVENOUS
  Administered 2022-01-27: 40 mg via INTRAVENOUS

## 2022-01-27 MED ORDER — STERILE WATER FOR IRRIGATION IR SOLN
Status: DC | PRN
  Administered 2022-01-27: 250 mL

## 2022-01-27 MED ORDER — SODIUM CHLORIDE 0.9 % IV SOLN: INTRAVENOUS | Status: DC

## 2022-01-27 SURGICAL SUPPLY — 6 items
GOWN CVR UNV OPN BCK APRN NK (MISCELLANEOUS) ×2 IMPLANT
GOWN ISOL THUMB LOOP REG UNIV (MISCELLANEOUS) ×4
KIT PRC NS LF DISP ENDO (KITS) ×1 IMPLANT
KIT PROCEDURE OLYMPUS (KITS) ×2
MANIFOLD NEPTUNE II (INSTRUMENTS) ×2 IMPLANT
WATER STERILE IRR 250ML POUR (IV SOLUTION) ×2 IMPLANT

## 2022-01-27 NOTE — Anesthesia Postprocedure Evaluation (Signed)
Anesthesia Post Note  Patient: Brittney [name]  Procedure(s) Performed: COLONOSCOPY WITH PROPOFOL (Rectum)     Patient location during evaluation: PACU Anesthesia Type: General Level of consciousness: awake and alert and oriented Pain management: satisfactory to patient Vital Signs Assessment: post-procedure vital signs reviewed and stable Respiratory status: spontaneous breathing, nonlabored ventilation and respiratory function stable Cardiovascular status: blood pressure returned to baseline and stable Postop Assessment: Adequate PO intake and No signs of nausea or vomiting Anesthetic complications: no   No notable events documented.  Raliegh Ip

## 2022-01-27 NOTE — Anesthesia Preprocedure Evaluation (Addendum)
Anesthesia Evaluation  Patient identified by MRN, date of birth, ID band Patient awake    Reviewed: Allergy & Precautions, H&P , NPO status , Patient's Chart, lab work & pertinent test results  History of Anesthesia Complications (+) PONV and history of anesthetic complications  Airway Mallampati: II  TM Distance: >3 FB Neck ROM: full    Dental no notable dental hx.    Pulmonary    Pulmonary exam normal breath sounds clear to auscultation       Cardiovascular Normal cardiovascular exam Rhythm:regular Rate:Normal     Neuro/Psych  Headaches, PSYCHIATRIC DISORDERS    GI/Hepatic   Endo/Other    Renal/GU      Musculoskeletal   Abdominal   Peds  Hematology   Anesthesia Other Findings   Reproductive/Obstetrics                             Anesthesia Physical Anesthesia Plan  ASA: 2  Anesthesia Plan: General   Post-op Pain Management: Minimal or no pain anticipated   Induction: Intravenous  PONV Risk Score and Plan: 4 or greater and Treatment may vary due to age or medical condition, TIVA and Propofol infusion  Airway Management Planned: Natural Airway  Additional Equipment:   Intra-op Plan:   Post-operative Plan:   Informed Consent: I have reviewed the patients History and Physical, chart, labs and discussed the procedure including the risks, benefits and alternatives for the proposed anesthesia with the patient or authorized representative who has indicated his/her understanding and acceptance.     Dental Advisory Given  Plan Discussed with: CRNA  Anesthesia Plan Comments:        Anesthesia Quick Evaluation

## 2022-01-27 NOTE — Anesthesia Procedure Notes (Signed)
Date/Time: 01/27/2022 7:29 AM  Performed by: Dionne Bucy, CRNAPre-anesthesia Checklist: Patient identified, Emergency Drugs available, Suction available, Patient being monitored and Timeout performed Patient Re-evaluated:Patient Re-evaluated prior to induction Oxygen Delivery Method: Nasal cannula Induction Type: IV induction Placement Confirmation: positive ETCO2

## 2022-01-27 NOTE — Op Note (Signed)
[facility] Gastroenterology Patient Name: Brittney [name] Procedure Date: 01/27/2022 7:14 AM MRN: [id number] Account #: [id number] Date of Birth: 05-22-1981 Admit Type: Outpatient Age: 41 Room: [facility] OR ROOM 01 Gender: Female Note Status: Finalized Instrument Name: [id number] Procedure:             Colonoscopy Indications:           Last colonoscopy: February 2018, Change in bowel                         habits, Change in stool caliber Providers:             Lin Landsman MD, MD Referring MD:          Wynelle Fanny. Tower (Referring MD) Medicines:             General Anesthesia Complications:         No immediate complications. Estimated blood loss: None. Procedure:             Pre-Anesthesia Assessment:                        - Prior to the procedure, a History and Physical was                         performed, and patient medications and allergies were                         reviewed. The patient is competent. The risks and                         benefits of the procedure and the sedation options and                         risks were discussed with the patient. All questions                         were answered and informed consent was obtained.                         Patient identification and proposed procedure were                         verified by the physician, the nurse, the                         anesthesiologist, the anesthetist and the technician                         in the pre-procedure area in the procedure room in the                         endoscopy suite. Mental Status Examination: alert and                         oriented. Airway Examination: normal oropharyngeal                         airway and neck mobility. Respiratory Examination:  clear to auscultation. CV Examination: normal.                         Prophylactic Antibiotics: The patient does not require                         prophylactic antibiotics.  Prior Anticoagulants: The                         patient has taken no previous anticoagulant or                         antiplatelet agents. ASA Grade Assessment: II - A                         patient with mild systemic disease. After reviewing                         the risks and benefits, the patient was deemed in                         satisfactory condition to undergo the procedure. The                         anesthesia plan was to use general anesthesia.                         Immediately prior to administration of medications,                         the patient was re-assessed for adequacy to receive                         sedatives. The heart rate, respiratory rate, oxygen                         saturations, blood pressure, adequacy of pulmonary                         ventilation, and response to care were monitored                         throughout the procedure. The physical status of the                         patient was re-assessed after the procedure.                        After obtaining informed consent, the colonoscope was                         passed under direct vision. Throughout the procedure,                         the patient's blood pressure, pulse, and oxygen                         saturations were monitored continuously. The  Colonoscope was introduced through the anus and                         advanced to the the cecum, identified by appendiceal                         orifice and ileocecal valve. The colonoscopy was                         performed without difficulty. The patient tolerated                         the procedure well. The quality of the bowel                         preparation was good. Findings:      The perianal and digital rectal examinations were normal. Pertinent       negatives include normal sphincter tone and no palpable rectal lesions.      Non-bleeding external hemorrhoids were found during  retroflexion. The       hemorrhoids were small.      A small post banding scar was found in the distal rectum. Impression:            - Non-bleeding external hemorrhoids.                        - Scar in the distal rectum.                        - No specimens collected. Recommendation:        - Discharge patient to home (with escort).                        - Resume previous diet today.                        - Continue present medications.                        - Repeat colonoscopy in 10 years for screening                         purposes. Procedure Code(s):     --- Professional ---                        [phone], Colonoscopy, flexible; diagnostic, including                         collection of specimen(s) by brushing or washing, when                         performed (separate procedure) Diagnosis Code(s):     --- Professional ---                        K62.89, Other specified diseases of anus and rectum                        K64.4, Residual hemorrhoidal skin tags  R19.4, Change in bowel habit                        R19.5, Other fecal abnormalities CPT copyright 2019 [employer]. All rights reserved. The codes documented in this report are preliminary and upon coder review may  be revised to meet current compliance requirements. Dr. Ulyess Mort Lin Landsman MD, MD 01/27/2022 7:52:16 AM This report has been signed electronically. Number of Addenda: 0 Note Initiated On: 01/27/2022 7:14 AM Scope Withdrawal Time: 0 hours 11 minutes 8 seconds  Total Procedure Duration: 0 hours 15 minutes 27 seconds  Estimated Blood Loss:  Estimated blood loss: none.      [facility]

## 2022-01-27 NOTE — Transfer of Care (Signed)
Immediate Anesthesia Transfer of Care Note  Patient: Brittney [name]  Procedure(s) Performed: COLONOSCOPY WITH PROPOFOL (Rectum)  Patient Location: PACU  Anesthesia Type: General  Level of Consciousness: awake, alert  and patient cooperative  Airway and Oxygen Therapy: Patient Spontanous Breathing and Patient connected to supplemental oxygen  Post-op Assessment: Post-op Vital signs reviewed, Patient's Cardiovascular Status Stable, Respiratory Function Stable, Patent Airway and No signs of Nausea or vomiting  Post-op Vital Signs: Reviewed and stable  Complications: No notable events documented.

## 2022-01-27 NOTE — H&P (Signed)
Cephas Darby, MD [address]  [place]  [place], [place] [address]  Main: [phone]  Fax: [phone] Pager: [phone]  Primary Care Physician:  Tower, Wynelle Fanny, MD Primary Gastroenterologist:  Dr. Cephas Darby  Pre-Procedure History & Physical: HPI:  Brittney [name] is a 41 y.o. female is here for an colonoscopy.   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 reports today has not had any spasms or chest pains in over 2 years     Fibroids    Gallstones    Irritable bowel syndrome    Joint pain    Lactose intolerance    Migraine    hasnt had one in a long time   Obesity, unspecified    PONV (postoperative nausea and vomiting)    after hysterectomy   Vitamin D deficiency     Past Surgical History:  Procedure Laterality Date   CHOLECYSTECTOMY  05/06/11   COLONOSCOPY  11/2001   polyp; negative pathology   COLONOSCOPY  09/2004   Negative   COLONOSCOPY  2017 or 2018 patient  unsure   [facility] ;    COLONOSCOPY W/ POLYPECTOMY     CYSTOSCOPY N/A 01/03/2018   Procedure: CYSTOSCOPY;  Surgeon: Bobbye Charleston, MD;  Location: [facility];  Service: Gynecology;  Laterality: N/A;   ESOPHAGOGASTRODUODENOSCOPY  1/10   normal   FLEXIBLE SIGMOIDOSCOPY  03/05/2012   Procedure: FLEXIBLE SIGMOIDOSCOPY;  Surgeon: Inda Castle, MD;  [facility];  Service: Endoscopy;  Laterality: N/A;   LAPAROSCOPIC DECORTICATION / DUBULKING / ABLATION RENAL CYSTS     saline histogram   ROBOTIC ASSISTED LAPAROSCOPIC HYSTERECTOMY AND SALPINGECTOMY Bilateral 01/03/2018   Procedure: XI ROBOTIC ASSISTED LAPAROSCOPIC HYSTERECTOMY AND SALPINGECTOMY;  Surgeon: Bobbye Charleston, MD;  [facility];  Service: Gynecology;  Laterality: Bilateral;  WITH BED AFTER   TONSILLECTOMY  07/30/07   Uterine US  09/2005   Large endometrial stripe; small ovarian cyst    Prior to Admission medications   Medication Sig Start Date End Date Taking? Authorizing Provider  amphetamine-dextroamphetamine (ADDERALL) 30 MG tablet Take 1 tablet by mouth 2 (two) times daily. Patient taking differently: Take 30 mg by mouth daily. 12/07/21  Yes Tower, Wynelle Fanny, MD  fluticasone (FLONASE) 50 MCG/ACT nasal spray PLACE 2 SPRAYS INTO BOTH NOSTRILS DAILY AS NEEDED (FOR ALLERGIES.). 10/18/21  Yes Tower, Wynelle Fanny, MD  Semaglutide,0.25 or 0.'5MG'$ /DOS, (OZEMPIC, 0.25 OR 0.5 MG/DOSE,) 2 MG/1.5ML SOPN Inject 0.5 mg into the skin once a week. 01/12/22  Yes Tower, Wynelle Fanny, MD  cyclobenzaprine (FLEXERIL) 10 MG tablet Take 1 tablet (10 mg total) by mouth 2 (two) times daily as needed for muscle spasms. Patient not taking: Reported on 01/24/2022 10/24/21   Sharion Balloon, NP  ketoconazole (NIZORAL) 2 % cream Apply 1 application. topically daily. Apply to affected areas (rash) once daily after bathing and drying well 12/22/21   Tower, Wynelle Fanny, MD  levocetirizine (XYZAL) 5 MG tablet Take 1 tablet (5 mg total) by mouth every evening. Patient not taking: Reported on 01/24/2022 09/24/19   Abner Greenspan, MD  montelukast (SINGULAIR) 10 MG tablet TAKE 1 TABLET BY MOUTH EVERYDAY AT BEDTIME Patient not taking: Reported on 01/24/2022 01/12/22   Tower, Roque Lias A, MD  polyethylene glycol powder (GLYCOLAX/MIRALAX) 17 GM/SCOOP powder Take 17 g by mouth daily. 12/29/21   Tower, Wynelle Fanny, MD  triamcinolone (KENALOG) 0.1 % paste Use as directed 1 application in the mouth or throat 2 (two) times daily. To affected area 10/26/20   Tower, Wynelle Fanny, MD  triamcinolone cream (KENALOG) 0.1 % Apply 1 application. topically 2 (two) times daily as needed (for Eczema). 12/07/21   Tower, Wynelle Fanny, MD    Allergies as of 01/13/2022 - Review Complete 01/13/2022  Allergen Reaction  Noted   Prevacid [lansoprazole] Rash 02/20/2007    Family History  Problem Relation Age of Onset   Heart disease Father    Hypertension Father    Heart attack Father    Diabetes Father    Sleep apnea Mother    Obesity Mother    Ulcers Brother    Clotting disorder Brother    Ovarian cancer Maternal Grandmother    Colon polyps Maternal Grandmother    Colon cancer Maternal Grandmother        40's   Diabetes Paternal Grandmother    Hypertension Paternal Grandmother    Breast cancer Maternal Aunt        Age 55's   Diabetes Paternal Aunt        x 5   Lung cancer Maternal Grandfather        SMOKER    Social History   Socioeconomic History   Marital status: Married    Spouse name: Not on file   Number of children: 2   Years of education: Not on file   Highest education level: Not on file  Occupational History   Occupation: Pharmacist, hospital  Tobacco Use   Smoking status: Never   Smokeless tobacco: Never  Vaping Use   Vaping Use: Never used  Substance and Sexual Activity   Alcohol use: Yes    Comment: rare   Drug use: No   Sexual activity: Yes    Birth control/protection: Condom  Other Topics Concern   Not on file  Social History Narrative   1 son      Teaches birth to K; 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 Use-soda         Social Determinants of Radio broadcast assistant Strain: Not on file  Food Insecurity: Not on file  Transportation Needs: Not on file  Physical Activity: Not on file  Stress: Not on file  Social Connections: Not on file  Intimate Partner Violence: Not on file    Review of Systems: See HPI, otherwise negative ROS  Physical Exam: BP 106/64   Pulse 66   Temp 97.7 F (36.5 C) (Temporal)   Ht '5\' 7"'$  (1.702 m)   Wt 101.6 kg   LMP 12/21/2017 Comment: 12/22/17, negative pregnanacy test  SpO2 100%   BMI 35.08 kg/m  General:   Alert,  pleasant and cooperative in NAD Head:  Normocephalic and atraumatic. Neck:  Supple; no masses or  thyromegaly. Lungs:  Clear throughout to auscultation.    Heart:  Regular rate and rhythm. Abdomen:  Soft, nontender and nondistended. Normal bowel sounds, without guarding, and without rebound.   Neurologic:  Alert and  oriented x4;  grossly normal neurologically.  Impression/Plan: Brittney [name] is here for an colonoscopy to be performed for rectal pressure/pain and change  in stool caliber Risks, benefits, limitations, and alternatives regarding  colonoscopy have been reviewed with the patient.  Questions have been answered.  All parties agreeable.   Sherri Sear, MD  01/27/2022, 7:18 AM

## 2022-01-28 ENCOUNTER — Encounter: Payer: Self-pay | Admitting: Gastroenterology

## 2022-01-31 ENCOUNTER — Telehealth: Payer: Self-pay | Admitting: Surgery

## 2022-01-31 ENCOUNTER — Ambulatory Visit (INDEPENDENT_AMBULATORY_CARE_PROVIDER_SITE_OTHER): Payer: BC Managed Care – PPO | Admitting: Surgery

## 2022-01-31 ENCOUNTER — Other Ambulatory Visit: Payer: Self-pay | Admitting: Family

## 2022-01-31 ENCOUNTER — Encounter: Payer: Self-pay | Admitting: Surgery

## 2022-01-31 VITALS — BP 123/84 | HR 70 | Temp 98.2°F | Ht 68.0 in | Wt 219.8 lb

## 2022-01-31 DIAGNOSIS — K602 Anal fissure, unspecified: Secondary | ICD-10-CM | POA: Diagnosis not present

## 2022-01-31 MED ORDER — AMPHETAMINE-DEXTROAMPHETAMINE 30 MG PO TABS: 30.0000 mg | ORAL_TABLET | Freq: Two times a day (BID) | ORAL | 0 refills | Status: DC

## 2022-01-31 NOTE — Patient Instructions (Signed)
Our surgery scheduler Pamala Hurry will call you within 24-48 hours to get you scheduled. If you have not heard from her after 48 hours, please call our office. Have the blue sheet available when she calls to write down important information.  Advised to pursue a goal of 25 to 30 g of fiber daily.  Made aware that the majority of this may be through natural sources, but advised to be aware of actual consumption and to ensure minimal consumption by daily supplementation.  Various forms of supplements discussed.  Recommended Psyllium husk, that mixes well with applesauce, or the powder which goes down well shaken with chocolate milk.  Strongly advised to consume more fluids to ensure adequate hydration, instructed to watch color of urine to determine adequacy of hydration.  Clarity is pursued in urine output, and bowel activity that correlates to significant meal intake.   We need to avoid deferring having bowel movements, advised to take the time at the first sign of sensation, typically following meals, and in the morning.   Subsequent utilization of MiraLAX may be needed ensure at least daily movement, ideally twice daily bowel movements.  If multiple doses of MiraLAX are necessary utilize them. Never skip a day...  To be regular, we must do the above EVERY day.    If you have any concerns or questions, please feel free to call our office.   How to Take a [employer] A sitz bath is a warm water bath that may be used to care for your rectum, genital area, or the area between your rectum and genitals (perineum). In a sitz bath, the water only comes up to your hips and covers your buttocks. A sitz bath may be done in a bathtub or with a portable sitz bath that fits over the toilet. Your health care provider may recommend a sitz bath to help: Relieve pain and discomfort after delivering a baby. Relieve pain and itching from hemorrhoids or anal fissures. Relieve pain after certain surgeries. Relax muscles  that are sore or tight. How to take a sitz bath Take 3-4 sitz baths a day, or as many as told by your health care provider. Bathtub sitz bath To take a sitz bath in a bathtub: Partially fill a bathtub with warm water. The water should be deep enough to cover your hips and buttocks when you are sitting in the tub. Follow your health care provider's instructions if you are told to put medicine in the water. Sit in the water. Open the tub drain a little, and leave it open during your bath. Turn on the warm water again, enough to replace the water that is draining out. Keep the water running throughout your bath. This helps keep the water at the right level and temperature. Soak in the water for 15-20 minutes, or as long as told by your health care provider. When you are done, be careful when you stand up. You may feel dizzy. After the sitz bath, pat yourself dry. Do not rub your skin to dry it.  Over-the-toilet sitz bath To take a sitz bath with an over-the-toilet basin: Follow the manufacturer's instructions. Fill the basin with warm water. Follow your health care provider's instructions if you were told to put medicine in the water. Sit on the seat. Make sure the water covers your buttocks and perineum. Soak in the water for 15-20 minutes, or as long as told by your health care provider. After the sitz bath, pat yourself dry. Do not rub  your skin to dry it. Clean and dry the basin between uses. Discard the basin if it cracks, or according to the manufacturer's instructions.  Contact a health care provider if: Your pain or itching gets worse. Do not continue with sitz baths if your symptoms get worse. You have new symptoms. Do not continue with sitz baths until you talk with your health care provider. Summary A sitz bath is a warm water bath in which the water only comes up to your hips and covers your buttocks. A sitz bath may help relieve pain and discomfort after delivering a baby. It  also may help with pain and itching from hemorrhoids or anal fissures, or pain after certain surgeries. It can also help to relax muscles that are sore or tight. Take 3-4 sitz baths a day, or as many as told by your health care provider. Soak in the water for 15-20 minutes. Do not continue with sitz baths if your symptoms get worse. This information is not intended to replace advice given to you by your health care provider. Make sure you discuss any questions you have with your health care provider. Document Revised: 04/14/2020 Document Reviewed: 04/16/2020 Elsevier Patient Education  [place].

## 2022-01-31 NOTE — Telephone Encounter (Signed)
Patient has been advised of Pre-Admission date/time, COVID Testing date and Surgery date.  Surgery Date: 02/04/22 Preadmission Testing Date: 02/02/22 (phone 8a-1p) Covid Testing Date: Not needed.    Patient has been made aware to call [phone], between 1-3:00pm the day before surgery, to find out what time to arrive for surgery.

## 2022-02-02 ENCOUNTER — Encounter: Payer: Self-pay | Admitting: Surgery

## 2022-02-02 ENCOUNTER — Encounter
Admission: RE | Admit: 2022-02-02 | Discharge: 2022-02-02 | Disposition: A | Discharge status: Home / Self Care | Payer: BC Managed Care – PPO | Source: Ambulatory Visit | Attending: Surgery | Admitting: Surgery

## 2022-02-02 ENCOUNTER — Other Ambulatory Visit: Payer: Self-pay

## 2022-02-02 MED ORDER — ONABOTULINUMTOXINA 100 UNITS IJ SOLR: 100.0000 [IU] | Freq: Once | INTRAMUSCULAR | Status: AC

## 2022-02-02 NOTE — Patient Instructions (Addendum)
Your procedure is scheduled on: 02/04/22 - Friday Report to the Registration Desk on the 1st floor of the [facility]. To find out your arrival time, please call [phone] between 1PM - 3PM on: 02/03/22 - Thursday If your arrival time is 6:00 am, do not arrive prior to that time as the [facility] entrance doors do not open until 6:00 am.  REMEMBER: Instructions that are not followed completely may result in serious medical risk, up to and including death; or upon the discretion of your surgeon and anesthesiologist your surgery may need to be rescheduled.  Do not eat food after midnight the night before surgery.  No gum chewing, lozengers or hard candies.  You may however, drink CLEAR liquids up to 2 hours before you are scheduled to arrive for your surgery. Do not drink anything within 2 hours of your scheduled arrival time.  Clear liquids include: - water  - apple juice without pulp - gatorade (not RED colors) - black coffee or tea (Do NOT add milk or creamers to the coffee or tea) Do NOT drink anything that is not on this list.  TAKE THESE MEDICATIONS THE MORNING OF SURGERY WITH A SIP OF WATER: None  One week prior to surgery: Stop Anti-inflammatories (NSAIDS) such as Advil, Aleve, Ibuprofen, Motrin, Naproxen, Naprosyn and Aspirin based products such as Excedrin, Goodys Powder, BC Powder.  Stop ANY OVER THE COUNTER supplements until after surgery.  You may take Tylenol if needed for pain up until the day of surgery.  No Alcohol for 24 hours before or after surgery.  No Smoking including e-cigarettes for 24 hours prior to surgery.  No chewable tobacco products for at least 6 hours prior to surgery.  No nicotine patches on the day of surgery.  Do not use any "recreational" drugs for at least a week prior to your surgery.  Please be advised that the combination of cocaine and anesthesia may have negative outcomes, up to and including death. If you test positive for  cocaine, your surgery will be cancelled.  On the morning of surgery brush your teeth with toothpaste and water, you may rinse your mouth with mouthwash if you wish. Do not swallow any toothpaste or mouthwash.  Do not wear jewelry, make-up, hairpins, clips or nail polish.  Do not wear lotions, powders, or perfumes.   Do not shave body from the neck down 48 hours prior to surgery just in case you cut yourself which could leave a site for infection.  Also, freshly shaved skin may become irritated if using the CHG soap.  Contact lenses, hearing aids and dentures may not be worn into surgery.  Do not bring valuables to the hospital. [facility] is not responsible for any missing/lost belongings or valuables.   Notify your doctor if there is any change in your medical condition (cold, fever, infection).  Wear comfortable clothing (specific to your surgery type) to the hospital.  After surgery, you can help prevent lung complications by doing breathing exercises.  Take deep breaths and cough every 1-2 hours. Your doctor may order a device called an Incentive Spirometer to help you take deep breaths. When coughing or sneezing, hold a pillow firmly against your incision with both hands. This is called "splinting." Doing this helps protect your incision. It also decreases belly discomfort.  If you are being admitted to the hospital overnight, leave your suitcase in the car. After surgery it may be brought to your room.  If you are being  discharged the day of surgery, you will not be allowed to drive home. You will need a responsible adult (18 years or older) to drive you home and stay with you that night.   If you are taking public transportation, you will need to have a responsible adult (18 years or older) with you. Please confirm with your physician that it is acceptable to use public transportation.   Please call the [facility] Dept. at [phone] if you have any questions  about these instructions.  Surgery Visitation Policy:  Patients undergoing a surgery or procedure may have two family members or support persons with them as long as the person is not COVID-19 positive or experiencing its symptoms.   Inpatient Visitation:    Visiting hours are 7 a.m. to 8 p.m. Up to four visitors are allowed at one time in a patient room, including children. The visitors may rotate out with other people during the day. One designated support person (adult) may remain overnight.

## 2022-02-02 NOTE — Progress Notes (Signed)
Patient ID: Brittney [name], female   DOB: 04/15/1981, 41 y.o.   MRN: [id number]   HPI Brittney [name] is a 41 y.o. female seen in consultation at the request of Dr. Marius Ditch.  She reports anorectal pain for several months .  The pain is intermittent moderate intensity and sharp in nature.  She describes as sometimes passing razor blades when having a bowel movement.  Certainly defecating make things worse.  She has seen Dr. Marius Ditch and had already been started on sitz bath's as well as nifedipine cream and nitroglycerin.  Unfortunately she has not responded to medical treatment and continues to have pain.  She did have a colonoscopy by her that I personally reviewed showing evidence of some benign polyps.  She has also tried multiple hemorrhoidal creams without any success. Colonoscopy shows some small hemorrhoids.   She is able to perform more than 4 METS of activity without any shortness of breath or chest pain.  She has had significant improvement in her weight.  CBC and CMP is normal.  She did have a CT scan of the abdomen pelvis that I have personally reviewed showing no evidence of any intra-abdominal abnormalities. Prior surgical history include hysterectomy and cholecystectomy.   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 reports today has not had any spasms or chest pains in over 2 years     Fibroids     Gallstones     Irritable bowel syndrome      Joint pain     Lactose intolerance     Migraine      hasnt had one in a long time   Obesity, unspecified     PONV (postoperative nausea and vomiting)      after hysterectomy   Vitamin D deficiency             Past Surgical History:  Procedure Laterality Date   CHOLECYSTECTOMY   05/06/11   COLONOSCOPY   11/2001  polyp; negative pathology   COLONOSCOPY   09/2004    Negative   COLONOSCOPY   2017 or 2018 patient  unsure    [facility] ;    COLONOSCOPY W/ POLYPECTOMY       COLONOSCOPY WITH PROPOFOL N/A 01/27/2022    Procedure: COLONOSCOPY WITH PROPOFOL;  Surgeon: Lin Landsman, MD;  Location: [facility];  Service: Endoscopy;  Laterality: N/A;   CYSTOSCOPY N/A 01/03/2018    Procedure: CYSTOSCOPY;  Surgeon: Bobbye Charleston, MD;  [facility];  Service: Gynecology;  Laterality: N/A;   ESOPHAGOGASTRODUODENOSCOPY   1/10    normal   FLEXIBLE SIGMOIDOSCOPY   03/05/2012    Procedure: FLEXIBLE SIGMOIDOSCOPY;  Surgeon: Inda Castle, MD;  [facility];  Service: Endoscopy;  Laterality: N/A;   LAPAROSCOPIC DECORTICATION / DUBULKING / ABLATION RENAL CYSTS        saline histogram   ROBOTIC ASSISTED LAPAROSCOPIC HYSTERECTOMY AND SALPINGECTOMY Bilateral 01/03/2018    Procedure: XI ROBOTIC ASSISTED LAPAROSCOPIC HYSTERECTOMY AND SALPINGECTOMY;  Surgeon: Bobbye Charleston, MD;  [facility];  Service: Gynecology;  Laterality: Bilateral;  WITH BED AFTER   TONSILLECTOMY   07/30/07   Uterine US   09/2005    Large endometrial stripe; small ovarian cyst           Family History  Problem Relation Age of Onset   Heart disease Father     Hypertension Father     Heart attack Father     Diabetes Father     Sleep apnea Mother     Obesity Mother     Ulcers Brother     Clotting disorder Brother     Ovarian cancer Maternal Grandmother     Colon polyps Maternal Grandmother     Colon cancer Maternal Grandmother          40's   Diabetes Paternal Grandmother      Hypertension Paternal Grandmother     Breast cancer Maternal Aunt          Age 59's   Diabetes Paternal Aunt          x 5   Lung cancer Maternal Grandfather          SMOKER      Social History Social History         Tobacco Use   Smoking status: Never   Smokeless tobacco: Never  Vaping Use   Vaping Use: Never used  Substance Use Topics   Alcohol use: Yes      Comment: rare   Drug use: No          Allergies  Allergen Reactions   Prevacid [Lansoprazole] Rash            Current Outpatient Medications  Medication Sig Dispense Refill   fluticasone (FLONASE) 50 MCG/ACT nasal spray PLACE 2 SPRAYS INTO BOTH NOSTRILS DAILY AS NEEDED (FOR ALLERGIES.). 48 mL 1   ketoconazole (NIZORAL) 2 % cream Apply 1 application. topically daily. Apply to affected areas (rash) once daily after bathing and drying well 30 g 1   polyethylene glycol powder (GLYCOLAX/MIRALAX) 17 GM/SCOOP powder Take 17 g by mouth daily. 3350 g 11   Semaglutide,0.25 or 0.'5MG'$ /DOS, (OZEMPIC, 0.25 OR 0.5 MG/DOSE,) 2 MG/1.5ML SOPN Inject 0.5 mg into the skin once a week. 3 mL 1   triamcinolone (KENALOG) 0.1 % paste Use as directed 1 application in the mouth or throat 2 (two) times daily. To affected area 5 g 1   triamcinolone  cream (KENALOG) 0.1 % Apply 1 application. topically 2 (two) times daily as needed (for Eczema). 30 g 3   amphetamine-dextroamphetamine (ADDERALL) 30 MG tablet Take 1 tablet by mouth 2 (two) times daily. 60 tablet 0             Current Facility-Administered Medications  Medication Dose Route Frequency Provider Last Rate Last Admin   botulinum toxin Type A (BOTOX) injection 100 Units  100 Units Intramuscular Once Gracious Renken F, MD            Review of Systems Full ROS  was asked and was negative except for the information on the HPI   Physical Exam Blood pressure 123/84, pulse 70, temperature 98.2 F (36.8 C), temperature source Oral, height '5\' 8"'$  (1.727 m), weight 219 lb 12.8 oz (99.7 kg),  last menstrual period 12/21/2017, SpO2 98 %. CONSTITUTIONAL: NAD. EYES: Pupils are equal, round,  Sclera are non-icteric. EARS, NOSE, MOUTH AND THROAT: . The oral mucosa is pink and moist. Hearing is intact to voice. LYMPH NODES:  Lymph nodes in the neck are normal. RESPIRATORY:  Lungs are clear. There is normal respiratory effort, with equal breath sounds bilaterally, and without pathologic use of accessory muscles. CARDIOVASCULAR: Heart is regular without murmurs, gallops, or rubs. GI: The abdomen is  soft, nontender, and nondistended. There are no palpable masses. There is no hepatosplenomegaly. There are normal bowel sounds in all quadrants. Rectal: There are no evidence of abscess or open wounds.  The exam is limited due to exquisite tenderness to palpation and specifically on posterior midline.  There is no bleeding and there is no masses.  Call to visualize a fissure due to to her pain  a 16.   MUSCULOSKELETAL: Normal muscle strength and tone. No cyanosis or edema.   SKIN: Turgor is good and there are no pathologic skin lesions or ulcers. NEUROLOGIC: Motor and sensation is grossly normal. Cranial nerves are grossly intact. PSYCH:  Oriented to person, place and time. Affect is normal.   Data Reviewed   I have personally reviewed the patient's imaging, laboratory findings and medical records.     Assessment/Plan 41 year old female with history of anorectal pain consistent with anal fissure that has not responded to medical treatment.  Discussed with the patient in detail about her disease process.  My recommendations for next steps to include Botox sphincterotomy procedure discussed with the patient in detail.  Risks, benefits and possible complications including but not limited to: Bleeding, infection, incontinence recurrence and the potential need for multiple injections.  She understands and wishes to proceed.  She wants to perform this as soon as possible as her symptoms are  significant Note that I spent about 55 minutes in this encounter including personally reviewing imaging studies, medical records, placing orders, counseling the patient and performing appropriate documentation.  A copy of this report was sent to the referring provider   Caroleen Hamman, MD [facility] General Surgeon

## 2022-02-02 NOTE — Consult Note (Signed)
Patient ID: Brittney [name], female   DOB: Sep 03, 1980, 41 y.o.   MRN: [id number]  HPI Brittney [name] is a 41 y.o. female seen in consultation at the request of Dr. Marius Ditch.  She reports anorectal pain for several months .  The pain is intermittent moderate intensity and sharp in nature.  She describes as sometimes passing razor blades when having a bowel movement.  Certainly defecating make things worse.  She has seen Dr. Marius Ditch and had already been started on sitz bath's as well as nifedipine cream and nitroglycerin.  Unfortunately she has not responded to medical treatment and continues to have pain.  She did have a colonoscopy by her that I personally reviewed showing evidence of some benign polyps.  She has also tried multiple hemorrhoidal creams without any success. Colonoscopy shows some small hemorrhoids.   She is able to perform more than 4 METS of activity without any shortness of breath or chest pain.  She has had significant improvement in her weight.  CBC and CMP is normal.  She did have a CT scan of the abdomen pelvis that I have personally reviewed showing no evidence of any intra-abdominal abnormalities. Prior surgical history include hysterectomy and cholecystectomy.  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 reports today has not had any spasms or chest pains in over 2 years     Fibroids    Gallstones    Irritable bowel syndrome    Joint pain    Lactose intolerance    Migraine    hasnt had one in a long time   Obesity, unspecified    PONV (postoperative nausea and vomiting)    after hysterectomy   Vitamin D deficiency     Past Surgical History:  Procedure Laterality Date    CHOLECYSTECTOMY  05/06/11   COLONOSCOPY  11/2001   polyp; negative pathology   COLONOSCOPY  09/2004   Negative   COLONOSCOPY  2017 or 2018 patient  unsure   [facility] ;    COLONOSCOPY W/ POLYPECTOMY     COLONOSCOPY WITH PROPOFOL N/A 01/27/2022   Procedure: COLONOSCOPY WITH PROPOFOL;  Surgeon: Lin Landsman, MD;  Location: [facility];  Service: Endoscopy;  Laterality: N/A;   CYSTOSCOPY N/A 01/03/2018   Procedure: CYSTOSCOPY;  Surgeon: Bobbye [place], MD;  Location: [facility];  Service: Gynecology;  Laterality: N/A;   ESOPHAGOGASTRODUODENOSCOPY  1/10   normal   FLEXIBLE SIGMOIDOSCOPY  03/05/2012   Procedure: FLEXIBLE SIGMOIDOSCOPY;  Surgeon: Inda Castle, MD;  [facility]: [facility];  Service: Endoscopy;  Laterality: N/A;   LAPAROSCOPIC DECORTICATION / DUBULKING / ABLATION RENAL CYSTS     saline histogram   ROBOTIC ASSISTED LAPAROSCOPIC HYSTERECTOMY AND SALPINGECTOMY Bilateral 01/03/2018   Procedure: XI ROBOTIC ASSISTED LAPAROSCOPIC HYSTERECTOMY AND SALPINGECTOMY;  Surgeon: Bobbye [place], MD;  Location: [facility];  Service: Gynecology;  Laterality: Bilateral;  WITH BED AFTER   TONSILLECTOMY  07/30/07   Uterine US  09/2005   Large endometrial stripe; small ovarian cyst    Family History  Problem Relation Age of Onset   Heart disease  Father    Hypertension Father    Heart attack Father    Diabetes Father    Sleep apnea Mother    Obesity Mother    Ulcers Brother    Clotting disorder Brother    Ovarian cancer Maternal Grandmother    Colon polyps Maternal Grandmother    Colon cancer Maternal Grandmother        47's   Diabetes Paternal Grandmother    Hypertension Paternal Grandmother    Breast cancer Maternal Aunt        Age 58's   Diabetes Paternal Aunt        x 5   Lung cancer Maternal Grandfather        SMOKER    Social History Social History   Tobacco Use   Smoking status: Never   Smokeless tobacco: Never  Vaping Use   Vaping Use: Never used   Substance Use Topics   Alcohol use: Yes    Comment: rare   Drug use: No    Allergies  Allergen Reactions   Prevacid [Lansoprazole] Rash    Current Outpatient Medications  Medication Sig Dispense Refill   fluticasone (FLONASE) 50 MCG/ACT nasal spray PLACE 2 SPRAYS INTO BOTH NOSTRILS DAILY AS NEEDED (FOR ALLERGIES.). 48 mL 1   ketoconazole (NIZORAL) 2 % cream Apply 1 application. topically daily. Apply to affected areas (rash) once daily after bathing and drying well 30 g 1   polyethylene glycol powder (GLYCOLAX/MIRALAX) 17 GM/SCOOP powder Take 17 g by mouth daily. 3350 g 11   Semaglutide,0.25 or 0.'5MG'$ /DOS, (OZEMPIC, 0.25 OR 0.5 MG/DOSE,) 2 MG/1.5ML SOPN Inject 0.5 mg into the skin once a week. 3 mL 1   triamcinolone (KENALOG) 0.1 % paste Use as directed 1 application in the mouth or throat 2 (two) times daily. To affected area 5 g 1   triamcinolone cream (KENALOG) 0.1 % Apply 1 application. topically 2 (two) times daily as needed (for Eczema). 30 g 3   amphetamine-dextroamphetamine (ADDERALL) 30 MG tablet Take 1 tablet by mouth 2 (two) times daily. 60 tablet 0   Current Facility-Administered Medications  Medication Dose Route Frequency Provider Last Rate Last Admin   botulinum toxin Type A (BOTOX) injection 100 Units  100 Units Intramuscular Once Tashima Scarpulla F, MD         Review of Systems Full ROS  was asked and was negative except for the information on the HPI  Physical Exam Blood pressure 123/84, pulse 70, temperature 98.2 F (36.8 C), temperature source Oral, height '5\' 8"'$  (1.727 m), weight 219 lb 12.8 oz (99.7 kg), last menstrual period 12/21/2017, SpO2 98 %. CONSTITUTIONAL: NAD. EYES: Pupils are equal, round,  Sclera are non-icteric. EARS, NOSE, MOUTH AND THROAT: . The oral mucosa is pink and moist. Hearing is intact to voice. LYMPH NODES:  Lymph nodes in the neck are normal. RESPIRATORY:  Lungs are clear. There is normal respiratory effort, with equal breath sounds  bilaterally, and without pathologic use of accessory muscles. CARDIOVASCULAR: Heart is regular without murmurs, gallops, or rubs. GI: The abdomen is  soft, nontender, and nondistended. There are no palpable masses. There is no hepatosplenomegaly. There are normal bowel sounds in all quadrants. Rectal: There are no evidence of abscess or open wounds.  The exam is limited due to exquisite tenderness to palpation and specifically on posterior midline.  There is no bleeding and there is no masses.  Call to visualize a fissure due to to her pain  a 16.  MUSCULOSKELETAL: Normal muscle strength and tone. No cyanosis or edema.   SKIN: Turgor is good and there are no pathologic skin lesions or ulcers. NEUROLOGIC: Motor and sensation is grossly normal. Cranial nerves are grossly intact. PSYCH:  Oriented to person, place and time. Affect is normal.  Data Reviewed  I have personally reviewed the patient's imaging, laboratory findings and medical records.    Assessment/Plan 41 year old female with history of anorectal pain consistent with anal fissure that has not responded to medical treatment.  Discussed with the patient in detail about her disease process.  My recommendations for next steps to include Botox sphincterotomy procedure discussed with the patient in detail.  Risks, benefits and possible complications including but not limited to: Bleeding, infection, incontinence recurrence and the potential need for multiple injections.  She understands and wishes to proceed.  She wants to perform this as soon as possible as her symptoms are significant Note that I spent about 55 minutes in this encounter including personally reviewing imaging studies, medical records, placing orders, counseling the patient and performing appropriate documentation.  A copy of this report was sent to the referring provider  Caroleen Hamman, MD FACS General Surgeon 02/02/2022, 9:54 AM

## 2022-02-03 MED ORDER — FAMOTIDINE 20 MG PO TABS: 20.0000 mg | ORAL_TABLET | Freq: Once | ORAL | Status: AC

## 2022-02-03 MED ORDER — CHLORHEXIDINE GLUCONATE CLOTH 2 % EX PADS
6.0000 | MEDICATED_PAD | Freq: Once | CUTANEOUS | Status: AC
  Administered 2022-02-04: 6 via TOPICAL

## 2022-02-03 MED ORDER — CHLORHEXIDINE GLUCONATE 0.12 % MT SOLN: 15.0000 mL | Freq: Once | OROMUCOSAL | Status: AC

## 2022-02-03 MED ORDER — LACTATED RINGERS IV SOLN: INTRAVENOUS | Status: DC

## 2022-02-03 MED ORDER — GABAPENTIN 300 MG PO CAPS: 300.0000 mg | ORAL_CAPSULE | ORAL | Status: AC

## 2022-02-03 MED ORDER — CELECOXIB 200 MG PO CAPS: 200.0000 mg | ORAL_CAPSULE | ORAL | Status: AC

## 2022-02-03 MED ORDER — ORAL CARE MOUTH RINSE: 15.0000 mL | Freq: Once | OROMUCOSAL | Status: AC

## 2022-02-03 MED ORDER — ACETAMINOPHEN 500 MG PO TABS: 1000.0000 mg | ORAL_TABLET | ORAL | Status: AC

## 2022-02-03 MED ORDER — CHLORHEXIDINE GLUCONATE CLOTH 2 % EX PADS: 6.0000 | MEDICATED_PAD | Freq: Once | CUTANEOUS | Status: DC

## 2022-02-04 ENCOUNTER — Encounter
Admission: RE | Disposition: A | Discharge status: Home / Self Care | Payer: Self-pay | Source: Ambulatory Visit | Attending: Surgery

## 2022-02-04 ENCOUNTER — Ambulatory Visit
Admission: RE | Admit: 2022-02-04 | Discharge: 2022-02-04 | Disposition: A | Discharge status: Home / Self Care | Payer: BC Managed Care – PPO | Source: Ambulatory Visit | Attending: Surgery | Admitting: Surgery

## 2022-02-04 ENCOUNTER — Other Ambulatory Visit: Payer: Self-pay

## 2022-02-04 ENCOUNTER — Encounter: Payer: Self-pay | Admitting: Surgery

## 2022-02-04 ENCOUNTER — Ambulatory Visit: Payer: BC Managed Care – PPO | Admitting: Certified Registered"

## 2022-02-04 DIAGNOSIS — F419 Anxiety disorder, unspecified: Secondary | ICD-10-CM | POA: Insufficient documentation

## 2022-02-04 DIAGNOSIS — K589 Irritable bowel syndrome without diarrhea: Secondary | ICD-10-CM | POA: Diagnosis not present

## 2022-02-04 DIAGNOSIS — Z6833 Body mass index (BMI) 33.0-33.9, adult: Secondary | ICD-10-CM | POA: Diagnosis not present

## 2022-02-04 DIAGNOSIS — K602 Anal fissure, unspecified: Secondary | ICD-10-CM | POA: Diagnosis not present

## 2022-02-04 DIAGNOSIS — F909 Attention-deficit hyperactivity disorder, unspecified type: Secondary | ICD-10-CM | POA: Insufficient documentation

## 2022-02-04 DIAGNOSIS — E669 Obesity, unspecified: Secondary | ICD-10-CM | POA: Diagnosis not present

## 2022-02-04 HISTORY — PX: BOTOX INJECTION: SHX5754

## 2022-02-04 SURGERY — EXAM UNDER ANESTHESIA
Anesthesia: General | Site: Rectum

## 2022-02-04 MED ORDER — BUPIVACAINE LIPOSOME 1.3 % IJ SUSP
INTRAMUSCULAR | Status: DC | PRN
  Administered 2022-02-04: 20 mL

## 2022-02-04 MED ORDER — LIDOCAINE HCL (PF) 2 % IJ SOLN
INTRAMUSCULAR | Status: AC
  Filled 2022-02-04: qty 5

## 2022-02-04 MED ORDER — ONDANSETRON HCL 4 MG/2ML IJ SOLN
INTRAMUSCULAR | Status: DC | PRN
  Administered 2022-02-04: 4 mg via INTRAVENOUS

## 2022-02-04 MED ORDER — DEXAMETHASONE SODIUM PHOSPHATE 10 MG/ML IJ SOLN
INTRAMUSCULAR | Status: DC | PRN
  Administered 2022-02-04: 8 mg via INTRAVENOUS

## 2022-02-04 MED ORDER — FENTANYL CITRATE (PF) 100 MCG/2ML IJ SOLN
INTRAMUSCULAR | Status: DC | PRN
  Administered 2022-02-04: 50 ug via INTRAVENOUS

## 2022-02-04 MED ORDER — ONDANSETRON HCL 4 MG/2ML IJ SOLN
INTRAMUSCULAR | Status: AC
  Filled 2022-02-04: qty 2

## 2022-02-04 MED ORDER — MIDAZOLAM HCL 2 MG/2ML IJ SOLN
INTRAMUSCULAR | Status: AC
  Filled 2022-02-04: qty 2

## 2022-02-04 MED ORDER — CHLORHEXIDINE GLUCONATE 0.12 % MT SOLN
OROMUCOSAL | Status: AC
  Administered 2022-02-04: 15 mL via OROMUCOSAL
  Filled 2022-02-04: qty 15

## 2022-02-04 MED ORDER — HYDROCODONE-ACETAMINOPHEN 5-325 MG PO TABS: 1.0000 | ORAL_TABLET | Freq: Four times a day (QID) | ORAL | 0 refills | Status: DC | PRN

## 2022-02-04 MED ORDER — FENTANYL CITRATE (PF) 100 MCG/2ML IJ SOLN
INTRAMUSCULAR | Status: AC
  Filled 2022-02-04: qty 2

## 2022-02-04 MED ORDER — 0.9 % SODIUM CHLORIDE (POUR BTL) OPTIME
TOPICAL | Status: DC | PRN
  Administered 2022-02-04: 500 mL

## 2022-02-04 MED ORDER — ACETAMINOPHEN 500 MG PO TABS
ORAL_TABLET | ORAL | Status: AC
  Administered 2022-02-04: 1000 mg via ORAL
  Filled 2022-02-04: qty 2

## 2022-02-04 MED ORDER — DEXAMETHASONE SODIUM PHOSPHATE 10 MG/ML IJ SOLN
INTRAMUSCULAR | Status: AC
  Filled 2022-02-04: qty 1

## 2022-02-04 MED ORDER — GABAPENTIN 300 MG PO CAPS
ORAL_CAPSULE | ORAL | Status: AC
  Administered 2022-02-04: 300 mg via ORAL
  Filled 2022-02-04: qty 1

## 2022-02-04 MED ORDER — ONABOTULINUMTOXINA 100 UNITS IJ SOLR
INTRAMUSCULAR | Status: DC | PRN
  Administered 2022-02-04: 50 [IU] via INTRAMUSCULAR

## 2022-02-04 MED ORDER — SEMAGLUTIDE (1 MG/DOSE) 4 MG/3ML ~~LOC~~ SOPN: 1.0000 mg | PEN_INJECTOR | SUBCUTANEOUS | 3 refills | Status: DC

## 2022-02-04 MED ORDER — PROPOFOL 10 MG/ML IV BOLUS
INTRAVENOUS | Status: DC | PRN
  Administered 2022-02-04: 20 mg via INTRAVENOUS
  Administered 2022-02-04: 30 mg via INTRAVENOUS
  Administered 2022-02-04: 20 mg via INTRAVENOUS

## 2022-02-04 MED ORDER — MIDAZOLAM HCL 2 MG/2ML IJ SOLN
INTRAMUSCULAR | Status: DC | PRN
  Administered 2022-02-04: 2 mg via INTRAVENOUS

## 2022-02-04 MED ORDER — FAMOTIDINE 20 MG PO TABS
ORAL_TABLET | ORAL | Status: AC
  Administered 2022-02-04: 20 mg via ORAL
  Filled 2022-02-04: qty 1

## 2022-02-04 MED ORDER — LIDOCAINE HCL (CARDIAC) PF 100 MG/5ML IV SOSY
PREFILLED_SYRINGE | INTRAVENOUS | Status: DC | PRN
  Administered 2022-02-04: 50 mg via INTRAVENOUS

## 2022-02-04 MED ORDER — FENTANYL CITRATE (PF) 100 MCG/2ML IJ SOLN: 25.0000 ug | INTRAMUSCULAR | Status: DC | PRN

## 2022-02-04 MED ORDER — CELECOXIB 200 MG PO CAPS
ORAL_CAPSULE | ORAL | Status: AC
  Administered 2022-02-04: 200 mg via ORAL
  Filled 2022-02-04: qty 1

## 2022-02-04 MED ORDER — PROPOFOL 1000 MG/100ML IV EMUL
INTRAVENOUS | Status: AC
  Filled 2022-02-04: qty 100

## 2022-02-04 MED ORDER — PROPOFOL 500 MG/50ML IV EMUL
INTRAVENOUS | Status: DC | PRN
  Administered 2022-02-04: 125 ug/kg/min via INTRAVENOUS

## 2022-02-04 MED ORDER — BUPIVACAINE LIPOSOME 1.3 % IJ SUSP
INTRAMUSCULAR | Status: AC
  Filled 2022-02-04: qty 20

## 2022-02-04 SURGICAL SUPPLY — 16 items
DRAPE 3/4 80X56 (DRAPES) ×2 IMPLANT
DRAPE LEGGINS SURG 28X43 STRL (DRAPES) ×2 IMPLANT
DRAPE UNDER BUTTOCK W/FLU (DRAPES) ×2 IMPLANT
ELECT REM PT RETURN 9FT ADLT (ELECTROSURGICAL) ×2
ELECTRODE REM PT RTRN 9FT ADLT (ELECTROSURGICAL) ×1 IMPLANT
GAUZE 4X4 16PLY ~~LOC~~+RFID DBL (SPONGE) ×2 IMPLANT
GLOVE BIO SURGEON STRL SZ7 (GLOVE) ×2 IMPLANT
GOWN STRL REUS W/ TWL LRG LVL3 (GOWN DISPOSABLE) ×2 IMPLANT
GOWN STRL REUS W/TWL LRG LVL3 (GOWN DISPOSABLE) ×4
MANIFOLD NEPTUNE II (INSTRUMENTS) ×2 IMPLANT
NS IRRIG 1000ML POUR BTL (IV SOLUTION) ×2 IMPLANT
PACK BASIN MINOR ARMC (MISCELLANEOUS) ×2 IMPLANT
SOL PREP PVP 2OZ (MISCELLANEOUS) ×2
SOLUTION PREP PVP 2OZ (MISCELLANEOUS) ×1 IMPLANT
SURGILUBE 2OZ TUBE FLIPTOP (MISCELLANEOUS) ×2 IMPLANT
WATER STERILE IRR 500ML POUR (IV SOLUTION) ×2 IMPLANT

## 2022-02-04 NOTE — Anesthesia Postprocedure Evaluation (Signed)
Anesthesia Post Note  Patient: Brittney [name]  Procedure(s) Performed: EXAM UNDER ANESTHESIA (Rectum) BOTOX INJECTION (Rectum)  Patient location during evaluation: PACU Anesthesia Type: General Level of consciousness: awake and alert Pain management: pain level controlled Vital Signs Assessment: post-procedure vital signs reviewed and stable Respiratory status: spontaneous breathing, nonlabored ventilation, respiratory function stable and patient connected to nasal cannula oxygen Cardiovascular status: blood pressure returned to baseline and stable Postop Assessment: no apparent nausea or vomiting Anesthetic complications: no   No notable events documented.   Last Vitals:  Vitals:   02/04/22 1030 02/04/22 1046  BP: 92/75 (!) 121/91  Pulse: 68 76  Resp: 15 16  Temp: (!) 36.3 C (!) 36.1 C  SpO2: 100% 100%    Last Pain:  Vitals:   02/04/22 1046  TempSrc: Temporal  PainSc: 0-No pain                 Lenard Simmer

## 2022-02-21 ENCOUNTER — Encounter: Payer: Self-pay | Admitting: Gastroenterology

## 2022-02-21 NOTE — Telephone Encounter (Signed)
Called in medication to the pharmacy Called in Nifedipine 2 percent and Lidocaine 5 percent.

## 2022-02-23 ENCOUNTER — Encounter: Payer: BC Managed Care – PPO | Admitting: Surgery

## 2022-03-02 ENCOUNTER — Encounter: Payer: Self-pay | Admitting: Family Medicine

## 2022-03-02 MED ORDER — AMPHETAMINE-DEXTROAMPHETAMINE 30 MG PO TABS: 30.0000 mg | ORAL_TABLET | Freq: Two times a day (BID) | ORAL | 0 refills | Status: DC

## 2022-03-02 MED ORDER — SEMAGLUTIDE (1 MG/DOSE) 4 MG/3ML ~~LOC~~ SOPN
1.0000 mg | PEN_INJECTOR | SUBCUTANEOUS | 3 refills | Status: DC
Start: 2022-03-02 — End: 2023-04-12

## 2022-03-11 ENCOUNTER — Encounter: Payer: Self-pay | Admitting: Family Medicine

## 2022-03-11 ENCOUNTER — Telehealth (INDEPENDENT_AMBULATORY_CARE_PROVIDER_SITE_OTHER): Payer: BC Managed Care – PPO | Admitting: Family Medicine

## 2022-03-11 VITALS — Ht 68.0 in | Wt 221.0 lb

## 2022-03-11 DIAGNOSIS — E6609 Other obesity due to excess calories: Secondary | ICD-10-CM | POA: Diagnosis not present

## 2022-03-11 DIAGNOSIS — Z6837 Body mass index (BMI) 37.0-37.9, adult: Secondary | ICD-10-CM | POA: Diagnosis not present

## 2022-03-11 DIAGNOSIS — F5102 Adjustment insomnia: Secondary | ICD-10-CM | POA: Diagnosis not present

## 2022-03-11 DIAGNOSIS — G4700 Insomnia, unspecified: Secondary | ICD-10-CM | POA: Insufficient documentation

## 2022-03-11 MED ORDER — TRAZODONE HCL 50 MG PO TABS: 25.0000 mg | ORAL_TABLET | Freq: Every evening | ORAL | 3 refills | Status: AC | PRN

## 2022-03-11 NOTE — Progress Notes (Unsigned)
Virtual Visit via Video Note  I connected with Brittney [name] on 03/11/22 at  8:30 AM EDT by a video enabled telemedicine application and verified that I am speaking with the correct person using two identifiers.  Location: Patient: her work place Provider: office    I discussed the limitations of evaluation and management by telemedicine and the availability of in person appointments. The patient expressed understanding and agreed to proceed. Parties involved in encounter  Patient: Brittney [name]  Provider:  Loura Pardon MD   History of Present Illness: Pt presents for sleep problems   Wt Readings from Last 3 Encounters:  03/11/22 221 lb (100.2 kg)  02/04/22 219 lb (99.3 kg)  01/31/22 219 lb 12.8 oz (99.7 kg)   33.60 kg/m  Has moved to [place], at work today  Has been coming here for 23 years!   Sleep trouble started a month ago  Goes to bed at 9 and cannot fall asleep until 2 am  At moments- cannot turn off her brain   Very sleepy when she goes to bed   Occ gets back up and watches tv   She does scroll on phone in bed  No computer in bed   She does read book   Had norco for anal fissures- 6 pills   Good routine -shower and bed same time daily  When she does fall asleep then wakes up 2 1/2 hours later   Coffee early in the day/ sometimes (not often)   Takes adderall in am only (has been holding the 2nd dose)   Exercise - typically in am at 5   Otc: took zquil It helped some  Was a little sedated   Has not tried melatonin   Stress level is ok  Moving went well /but stressful at the end Job is great/ no extra stress    had hemorrhoid surgery  Did banding  Had anal fissure - botox   Patient Active Problem List   Diagnosis Date Noted   Insomnia 03/11/2022   Change in stool caliber    Intertrigo 12/22/2021   Lipid screening 08/30/2021   Diabetes mellitus screening 08/30/2021   Mixed incontinence 06/30/2021   Overactive bladder 06/30/2021    Tongue lesion 10/26/2020   ADD (attention deficit disorder) 07/30/2020   Social anxiety disorder 07/30/2020   Back pain 03/20/2020   Attention and concentration deficit 12/28/2019   Allergic rhinitis 12/11/2019   Vitamin D deficiency 09/16/2019   Mild reactive airways disease 03/20/2019   Hot flashes 02/20/2019   Right knee pain 10/17/2018   Benign paroxysmal positional vertigo 05/16/2018   Lipoma of back 10/04/2017   Routine general medical examination at a health care facility 08/29/2017   Fibrocystic breast changes 01/08/2016   Hemorrhoids, external 09/30/2014   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[place]) 07/04/2014   Hemorrhoids, internal, with bleeding 04/22/2014   Family history of pulmonary embolism 09/02/2013   Family history of diabetes mellitus 09/02/2013   Cutaneous skin tags 05/15/2013   Sclerosis, ilium, 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   Vaginal discharge 12/20/2010   Rectal pain 10/14/2009   Class 2 obesity due to excess calories without serious comorbidity with body mass index (BMI) of 37.0 to 37.9 in adult 03/31/2008   HEARING LOSS, MILD 03/31/2008   MIGRAINE HEADACHE 02/20/2007   IBS 02/20/2007   OSTEOARTHRITIS 02/20/2007   Past Medical History:  Diagnosis Date   ADHD    Anemia    B12 deficiency    Back pain  Colon polyps    Coronary artery abnormality    spasms    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 reports today has not had any spasms or chest pains in over 2 years     Fibroids    Gallstones    Irritable bowel syndrome    Joint pain    Lactose intolerance    Migraine    hasnt had one in a long time   Obesity, unspecified    PONV (postoperative nausea and vomiting)    after hysterectomy   Vitamin D deficiency    Past Surgical History:  Procedure Laterality Date   BOTOX INJECTION N/A 02/04/2022   Procedure: BOTOX INJECTION;   Surgeon: Jules Husbands, MD;  Location: [facility];  Service: General;  Laterality: N/A;   CHOLECYSTECTOMY  05/06/2011   COLONOSCOPY  11/2001   polyp; negative pathology   COLONOSCOPY  09/2004   Negative   COLONOSCOPY  2017 or 2018 patient  unsure    GI ;    COLONOSCOPY W/ POLYPECTOMY     COLONOSCOPY WITH PROPOFOL N/A 01/27/2022   Procedure: COLONOSCOPY WITH PROPOFOL;  Surgeon: Lin Landsman, MD;  Location: [facility];  Service: Endoscopy;  Laterality: N/A;   CYSTOSCOPY N/A 01/03/2018   Procedure: CYSTOSCOPY;  Surgeon: Bobbye [place], MD;  [facility];  Service: Gynecology;  Laterality: N/A;   ESOPHAGOGASTRODUODENOSCOPY  08/2008   normal   FLEXIBLE SIGMOIDOSCOPY  03/05/2012   Procedure: FLEXIBLE SIGMOIDOSCOPY;  Surgeon: Inda Castle, MD;  [facility];  Service: Endoscopy;  Laterality: N/A;   LAPAROSCOPIC DECORTICATION / DUBULKING / ABLATION RENAL CYSTS     saline histogram   ROBOTIC ASSISTED LAPAROSCOPIC HYSTERECTOMY AND SALPINGECTOMY Bilateral 01/03/2018   Procedure: XI ROBOTIC ASSISTED LAPAROSCOPIC HYSTERECTOMY AND SALPINGECTOMY;  Surgeon: Bobbye [place], MD;  [facility];  Service: Gynecology;  Laterality: Bilateral;  WITH BED AFTER   TONSILLECTOMY  07/30/2007   Uterine US  09/2005   Large endometrial stripe; small ovarian cyst   WISDOM TOOTH EXTRACTION     Social History   Tobacco Use   Smoking status: Never   Smokeless tobacco: Never  Vaping Use   Vaping Use: Never used  Substance Use Topics   Alcohol use: Yes    Comment: rare   Drug use: No   Family History  Problem Relation Age of Onset   Heart disease Father    Hypertension Father    Heart attack Father    Diabetes Father    Sleep apnea Mother    Obesity Mother    Ulcers Brother    Clotting disorder Brother    Ovarian cancer Maternal Grandmother    Colon polyps Maternal Grandmother    Colon cancer Maternal Grandmother        40's   Diabetes Paternal  Grandmother    Hypertension Paternal Grandmother    Breast cancer Maternal Aunt        Age 26's   Diabetes Paternal Aunt        x 5   Lung cancer Maternal Grandfather        SMOKER   Allergies  Allergen Reactions   Prevacid [Lansoprazole] Rash   Current Outpatient Medications on File Prior to Visit  Medication Sig Dispense Refill   amphetamine-dextroamphetamine (ADDERALL) 30 MG tablet Take 1 tablet by mouth  2 (two) times daily. 60 tablet 0   fluticasone (FLONASE) 50 MCG/ACT nasal spray PLACE 2 SPRAYS INTO BOTH NOSTRILS DAILY AS NEEDED (FOR ALLERGIES.). 48 mL 1   ketoconazole (NIZORAL) 2 % cream Apply 1 application. topically daily. Apply to affected areas (rash) once daily after bathing and drying well (Patient taking differently: Apply 1 application  topically daily as needed. Apply to affected areas (rash) once daily after bathing and drying well) 30 g 1   polyethylene glycol powder (GLYCOLAX/MIRALAX) 17 GM/SCOOP powder Take 17 g by mouth daily. 3350 g 11   Semaglutide, 1 MG/DOSE, 4 MG/3ML SOPN Inject 1 mg as directed once a week. 3 mL 3   triamcinolone cream (KENALOG) 0.1 % Apply 1 application. topically 2 (two) times daily as needed (for Eczema). 30 g 3   Current Facility-Administered Medications on File Prior to Visit  Medication Dose Route Frequency Provider Last Rate Last Admin   botulinum toxin Type A (BOTOX) injection 100 Units  100 Units Intramuscular Once Pabon, Marjory Lies, MD       Review of Systems  Constitutional:  Positive for malaise/fatigue. Negative for chills and fever.  HENT:  Negative for congestion, ear pain, sinus pain and sore throat.   Eyes:  Negative for blurred vision, discharge and redness.  Respiratory:  Negative for cough, shortness of breath and stridor.   Cardiovascular:  Negative for chest pain, palpitations and leg swelling.  Gastrointestinal:  Negative for abdominal pain, diarrhea, nausea and vomiting.  Musculoskeletal:  Negative for myalgias.  Skin:   Negative for rash.  Neurological:  Negative for dizziness and headaches.  Psychiatric/Behavioral:  Negative for depression. The patient is nervous/anxious and has insomnia.     Observations/Objective:  Patient appears well, in no distress Weight is baseline  No facial swelling or asymmetry Normal voice-not hoarse and no slurred speech No obvious tremor or mobility impairment Moving neck and UEs normally Able to hear the call well  No cough or shortness of breath during interview  Talkative and mentally sharp with no cognitive changes No skin changes on face or neck , no rash or pallor Affect is normal   Assessment and Plan: Problem List Items Addressed This Visit       Other   Class 2 obesity due to excess calories without serious comorbidity with body mass index (BMI) of 37.0 to 37.9 in adult - Primary   Insomnia    In the setting of a move and job change  Discussed sleep habits and schedule  Reviewed principles of sleep hygiene Px trazodone to try 25-50 mg before bed  Rev poss side eff incl sedation  Suspect she will only need it short term         Follow Up Instructions: Try to practice good sleep hygiene and minimize caffeine  The Calm app is helpful for meditation and sleep programs   Try the trazodone 1/2 to 1 pill before bedtime as needed    I discussed the assessment and treatment plan with the patient. The patient was provided an opportunity to ask questions and all were answered. The patient agreed with the plan and demonstrated an understanding of the instructions.   The patient was advised to call back or seek an in-person evaluation if the symptoms worsen or if the condition fails to improve as anticipated.     Loura Pardon, MD

## 2022-03-13 NOTE — Assessment & Plan Note (Signed)
In the setting of a move and job change  Discussed sleep habits and schedule  Reviewed principles of sleep hygiene Px trazodone to try 25-50 mg before bed  Rev poss side eff incl sedation  Suspect she will only need it short term

## 2022-03-13 NOTE — Patient Instructions (Signed)
Try to practice good sleep hygiene and minimize caffeine  The Calm app is helpful for meditation and sleep programs   Try the trazodone 1/2 to 1 pill before bedtime as needed

## 2022-03-23 ENCOUNTER — Encounter (INDEPENDENT_AMBULATORY_CARE_PROVIDER_SITE_OTHER): Payer: Self-pay

## 2022-03-25 ENCOUNTER — Telehealth: Payer: Self-pay | Admitting: Gastroenterology

## 2022-03-25 NOTE — Telephone Encounter (Signed)
Pts meical records were sent to [facility]

## 2022-04-11 ENCOUNTER — Encounter: Payer: Self-pay | Admitting: Surgery

## 2022-04-11 ENCOUNTER — Other Ambulatory Visit: Payer: Self-pay

## 2022-04-11 ENCOUNTER — Ambulatory Visit (INDEPENDENT_AMBULATORY_CARE_PROVIDER_SITE_OTHER): Payer: BC Managed Care – PPO | Admitting: Surgery

## 2022-04-11 VITALS — BP 119/84 | HR 71 | Temp 98.1°F | Ht 68.0 in | Wt 211.2 lb

## 2022-04-11 DIAGNOSIS — K602 Anal fissure, unspecified: Secondary | ICD-10-CM | POA: Diagnosis not present

## 2022-04-11 NOTE — Patient Instructions (Addendum)
Continue using the Nifedipine ointment twice daily. Continue with sitz baths.  Please call with any questions or concerns.   Nefedipine cream call ed [facility] [place] [phone]

## 2022-04-12 NOTE — Progress Notes (Signed)
Outpatient Surgical Follow Up  04/12/2022  Brittney [name] is an 41 y.o. female.   Chief Complaint  Patient presents with   Routine Post Op    EUA    HPI: Brittney [name]  41 y.o. female following up for anal fissure.  She has been doing the nifedipine cream and also had chemical sphincterotomy 2 months ago or so.  She endorses significant improvement in pain and anorectal symptoms.  Now some occasional discomfort.  She does admit some constipation.  No fevers no chills no hematochezia. 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 reports today has not had any spasms or chest pains in over 2 years     Fibroids    Gallstones    Irritable bowel syndrome    Joint pain    Lactose intolerance    Migraine    hasnt had one in a long time   Obesity, unspecified    PONV (postoperative nausea and vomiting)    after hysterectomy   Vitamin D deficiency     Past Surgical History:  Procedure Laterality Date   BOTOX INJECTION N/A 02/04/2022   Procedure: BOTOX INJECTION;  Surgeon: Jules Husbands, MD;  Location: [facility];  Service: General;  Laterality: N/A;   CHOLECYSTECTOMY  05/06/2011   COLONOSCOPY  11/2001   polyp; negative pathology   COLONOSCOPY  09/2004   Negative   COLONOSCOPY  2017 or 2018 patient  unsure   [facility] ;    COLONOSCOPY W/ POLYPECTOMY     COLONOSCOPY WITH PROPOFOL N/A 01/27/2022   Procedure: COLONOSCOPY WITH PROPOFOL;  Surgeon: Lin Landsman, MD;  Location: [place];  Service: Endoscopy;  Laterality: N/A;   CYSTOSCOPY N/A 01/03/2018   Procedure: CYSTOSCOPY;  Surgeon: Bobbye Charleston, MD;  Location: [facility];  Service: Gynecology;  Laterality:  N/A;   ESOPHAGOGASTRODUODENOSCOPY  08/2008   normal   FLEXIBLE SIGMOIDOSCOPY  03/05/2012   Procedure: FLEXIBLE SIGMOIDOSCOPY;  Surgeon: Inda Castle, MD;  Location: [facility];  Service: Endoscopy;  Laterality: N/A;   LAPAROSCOPIC DECORTICATION / DUBULKING / ABLATION RENAL CYSTS     saline histogram   ROBOTIC ASSISTED LAPAROSCOPIC HYSTERECTOMY AND SALPINGECTOMY Bilateral 01/03/2018   Procedure: XI ROBOTIC ASSISTED LAPAROSCOPIC HYSTERECTOMY AND SALPINGECTOMY;  Surgeon: Bobbye Charleston, MD;  Location: [facility];  Service: Gynecology;  Laterality: Bilateral;  WITH BED AFTER   TONSILLECTOMY  07/30/2007   Uterine US  09/2005   Large endometrial stripe; small ovarian cyst   WISDOM TOOTH EXTRACTION      Family History  Problem Relation Age of Onset   Heart disease Father    Hypertension Father    Heart attack Father    Diabetes Father    Sleep apnea Mother    Obesity Mother    Ulcers Brother    Clotting disorder Brother    Ovarian cancer Maternal Grandmother    Colon polyps Maternal Grandmother    Colon cancer Maternal Grandmother        40's   Diabetes Paternal Grandmother    Hypertension Paternal Grandmother  Breast cancer Maternal Aunt        Age 75's   Diabetes Paternal Aunt        x 5   Lung cancer Maternal Grandfather        SMOKER    Social History:  reports that she has never smoked. She has never used smokeless tobacco. She reports current alcohol use. She reports that she does not use drugs.  Allergies:  Allergies  Allergen Reactions   Prevacid [Lansoprazole] Rash    Medications reviewed.    ROS Full ROS performed and is otherwise negative other than what is stated in HPI   BP 119/84   Pulse 71   Temp 98.1 F (36.7 C) (Oral)   Ht '5\' 8"'$  (1.727 m)   Wt 211 lb 3.2 oz (95.8 kg)   LMP 12/21/2017 Comment: 12/22/17, negative pregnanacy test  SpO2 99%   BMI 32.11 kg/m   Physical Exam CONSTITUTIONAL: NAD. EYES: Pupils are equal, round,  Sclera are  non-icteric. EARS, NOSE, MOUTH AND THROAT: . The oral mucosa is pink and moist. Hearing is intact to voice. LYMPH NODES:  Lymph nodes in the neck are normal. RESPIRATORY:  Lungs are clear. There is normal respiratory effort, with equal breath sounds bilaterally, and without pathologic use of accessory muscles. CARDIOVASCULAR: Heart is regular without murmurs, gallops, or rubs. GI: The abdomen is  soft, nontender, and nondistended. There are no palpable masses. There is no hepatosplenomegaly. There are normal bowel sounds in all quadrants. Rectal: There is no evidence of abscess or open wounds.  There is some mild posterior tenderness on posterior midline.  There is no bleeding and there is no masses.  He has a small internal hemorrhoid grade 2 left lateral position MUSCULOSKELETAL: Normal muscle strength and tone. No cyanosis or edema.   SKIN: Turgor is good and there are no pathologic skin lesions or ulcers. NEUROLOGIC: Motor and sensation is grossly normal. Cranial nerves are grossly intact. PSYCH:  Oriented to person, place and time. Affect is normal.     Assessment/Plan: 41 year old female with anal fissure.  At this point in time I do think that her symptoms have improved.  Continue appropriate hygiene as well as avoidance of constipation and nifedipine cream.  We will go ahead and do a refill on nifedipine cream.  No need for surgical intervention at this time.  We will be happy to see her in a few weeks or so.  She is now moved to [place] and she may choose to find another provider closer to home. I spent 30 minutes in this encounter including personally reviewing imaging studies, medical records, placing orders, counseling the patient and performing appropriate documentation.     Caroleen Hamman, MD [facility] General Surgeon

## 2022-04-13 ENCOUNTER — Telehealth: Payer: Self-pay

## 2022-04-13 NOTE — Telephone Encounter (Signed)
Prior auth started for Ozempic (1 MG/DOSE) '4MG'$ /3ML pen-injectors. Rowland Lathe wilson ([employer]) Rx #: [id number] Waiting for determination.

## 2022-04-14 ENCOUNTER — Encounter: Payer: Self-pay | Admitting: Family Medicine

## 2022-04-14 ENCOUNTER — Other Ambulatory Visit: Payer: Self-pay | Admitting: Family Medicine

## 2022-04-14 NOTE — Telephone Encounter (Signed)
She is not diabetic so we won't be able to get if covered

## 2022-04-14 NOTE — Telephone Encounter (Signed)
Prior auth for Ozempic (1 MG/DOSE) '4MG'$ /3ML pen-injectors has been denied. Brittney [name] (Brittney [name]) Rx #: [id number]  We denied your request because we did not see what we need to approve the drug you asked for, (Ozempic).  This request tells [place] your doctor asked for this drug for your illness (obesity).  We may be able to approve this drug for this illness (type 2 diabetes mellitus).  Denial letter sent to scanning.

## 2022-05-17 ENCOUNTER — Other Ambulatory Visit: Payer: Self-pay | Admitting: Family Medicine

## 2022-05-17 MED ORDER — AMPHETAMINE-DEXTROAMPHETAMINE 30 MG PO TABS: 30.0000 mg | ORAL_TABLET | Freq: Two times a day (BID) | ORAL | 0 refills | Status: DC

## 2022-05-17 NOTE — Telephone Encounter (Signed)
Name of Medication: Adderall Name of Pharmacy: [facility] [place] or Written Date and Quantity: 03/02/22 #60 tab/ 0 refills Last Office Visit and Type: 03/11/22 virtual visit Next Office Visit and Type: none scheduled

## 2022-06-15 ENCOUNTER — Other Ambulatory Visit: Payer: Self-pay | Admitting: Family Medicine

## 2022-06-16 MED ORDER — AMPHETAMINE-DEXTROAMPHETAMINE 30 MG PO TABS: 30.0000 mg | ORAL_TABLET | Freq: Two times a day (BID) | ORAL | 0 refills | Status: DC

## 2022-06-16 NOTE — Telephone Encounter (Signed)
Refill request for amphetamine-dextroamphetamine (ADDERALL) 30 MG tablet   LOV - 03/11/22 Next OV - not scheduled Last refill - 05/17/22 #60/0

## 2022-06-30 ENCOUNTER — Encounter: Payer: Self-pay | Admitting: Family Medicine

## 2022-08-03 ENCOUNTER — Other Ambulatory Visit: Payer: Self-pay | Admitting: Family Medicine

## 2022-08-03 MED ORDER — AMPHETAMINE-DEXTROAMPHETAMINE 30 MG PO TABS
30.0000 mg | ORAL_TABLET | Freq: Two times a day (BID) | ORAL | 0 refills | Status: DC
Start: 2022-08-03 — End: 2022-09-24

## 2022-08-03 NOTE — Telephone Encounter (Signed)
Name of Medication: Adderall Name of Pharmacy[facility] [place] or Written Date and Quantity: 06/16/22 #60 tabs/ 0 refills Last Office Visit and Type: virtual f/u on 03/11/22 Next Office Visit and Type: none scheduled

## 2022-09-24 ENCOUNTER — Other Ambulatory Visit: Payer: Self-pay | Admitting: Family Medicine

## 2022-09-26 MED ORDER — AMPHETAMINE-DEXTROAMPHETAMINE 30 MG PO TABS
30.0000 mg | ORAL_TABLET | Freq: Two times a day (BID) | ORAL | 0 refills | Status: DC
Start: 2022-09-26 — End: 2022-11-07

## 2022-09-26 NOTE — Telephone Encounter (Signed)
Name of Medication: Adderall Name of Pharmacy: [facility] [place] or Written Date and Quantity: 08/03/22 #60 tabs/ 0 refills Last Office Visit and Type: virtual f/u on 03/11/22 Next Office Visit and Type: none scheduled

## 2022-09-28 ENCOUNTER — Encounter: Payer: Self-pay | Admitting: Family Medicine

## 2022-09-28 NOTE — Telephone Encounter (Signed)
Let her know that I am not currently taking new patients but I can make an exception Please schedule an appt to establish when convenient

## 2022-09-29 NOTE — Telephone Encounter (Signed)
Patient has been scheduled.

## 2022-11-07 ENCOUNTER — Other Ambulatory Visit: Payer: Self-pay | Admitting: Family Medicine

## 2022-11-08 MED ORDER — AMPHETAMINE-DEXTROAMPHETAMINE 30 MG PO TABS: 30.0000 mg | ORAL_TABLET | Freq: Two times a day (BID) | ORAL | 0 refills | Status: DC

## 2022-11-08 NOTE — Telephone Encounter (Signed)
Name of Medication: Adderall Name of Pharmacy[facility] [place] or Written Date and Quantity: 09/26/22 #60 tabs/ 0 refills Last Office Visit and Type: virtual f/u on 03/11/22 Next Office Visit and Type: none scheduled

## 2022-12-23 ENCOUNTER — Other Ambulatory Visit: Payer: Self-pay | Admitting: Family Medicine

## 2022-12-26 MED ORDER — AMPHETAMINE-DEXTROAMPHETAMINE 30 MG PO TABS: 30.0000 mg | ORAL_TABLET | Freq: Two times a day (BID) | ORAL | 0 refills | Status: DC

## 2022-12-26 NOTE — Telephone Encounter (Signed)
Name of Medication: Adderall Name of [facility] [place] Last Fill or Written Date and Quantity: 11/08/22 #60 tabs/ 0 refills Last Office Visit and Type: virtual f/u on 03/11/22 Next Office Visit and Type: none scheduled

## 2023-01-25 ENCOUNTER — Other Ambulatory Visit: Payer: Self-pay | Admitting: Family Medicine

## 2023-01-25 MED ORDER — AMPHETAMINE-DEXTROAMPHETAMINE 30 MG PO TABS: 30.0000 mg | ORAL_TABLET | Freq: Two times a day (BID) | ORAL | 0 refills | Status: DC

## 2023-01-25 NOTE — Telephone Encounter (Signed)
Needs f/u or PE this summer  Please schedule

## 2023-01-25 NOTE — Telephone Encounter (Signed)
Patient scheduled for f/u on 7/19

## 2023-01-25 NOTE — Telephone Encounter (Signed)
Name of Medication: Adderall Name of [facility] [place] Last Fill or Written Date and Quantity: 12/26/22 #60 tabs/ 0 refills Last Office Visit and Type: virtual f/u on 03/11/22 Next Office Visit and Type: none scheduled

## 2023-03-03 ENCOUNTER — Ambulatory Visit: Payer: Self-pay | Admitting: Family Medicine

## 2023-03-06 ENCOUNTER — Encounter: Payer: Self-pay | Admitting: Family Medicine

## 2023-04-12 ENCOUNTER — Telehealth: Payer: Self-pay | Admitting: Family Medicine

## 2023-04-12 ENCOUNTER — Encounter: Payer: Self-pay | Admitting: Family Medicine

## 2023-04-12 VITALS — BP 133/90 | HR 87 | Temp 98.1°F

## 2023-04-12 DIAGNOSIS — R030 Elevated blood-pressure reading, without diagnosis of hypertension: Secondary | ICD-10-CM | POA: Insufficient documentation

## 2023-04-12 DIAGNOSIS — E6609 Other obesity due to excess calories: Secondary | ICD-10-CM

## 2023-04-12 DIAGNOSIS — F900 Attention-deficit hyperactivity disorder, predominantly inattentive type: Secondary | ICD-10-CM

## 2023-04-12 MED ORDER — FLUTICASONE PROPIONATE 50 MCG/ACT NA SUSP: 2.0000 | Freq: Every day | NASAL | 2 refills | Status: AC | PRN

## 2023-04-12 MED ORDER — AMPHETAMINE-DEXTROAMPHETAMINE 30 MG PO TABS: 30.0000 mg | ORAL_TABLET | Freq: Two times a day (BID) | ORAL | 0 refills | Status: DC

## 2023-04-12 NOTE — Assessment & Plan Note (Signed)
Today  BP: (!) 133/90   Will schedule follow up here for this  Was running around/ stressful day Has strong fam history of HTN so we need to follow   In past no problems with adderall causing increase blood pressure or pulse   Continues good diet/ low in processed foods and good exercise habits

## 2023-04-12 NOTE — Progress Notes (Signed)
Virtual Visit via Video Note  I connected with Brittney [name] on 04/12/23 at 11:30 AM EDT by a video enabled telemedicine application and verified that I am speaking with the correct person using two identifiers.  Location: Patient: work (in [place]) Provider: office    I discussed the limitations of evaluation and management by telemedicine and the availability of in person appointments. The patient expressed understanding and agreed to proceed.  Parties involved in encounter  Patient:  Provider:  [name]    History of Present Illness: Pt presents for medication management for  Obesity  ADD  Feeling pretty good overall    Wt Readings from Last 3 Encounters:  04/11/22 211 lb 3.2 oz (95.8 kg)  03/11/22 221 lb (100.2 kg)  02/04/22 219 lb (99.3 kg)   Semaglutide 1 mg weekly-no longer covered   Lab Results  Component Value Date   HGBA1C 4.4 08/30/2021     Exercise at 5 am in gym 3 d per week Plans to increase to 5 d per week   Goes to bed early     ADD Takes adderall 30 mg bid (some days just one)  The adderall is still helpful Needs refill   No side effects    BP Readings from Last 3 Encounters:  04/12/23 (!) 133/90  04/11/22 119/84  02/04/22 (!) 121/91  Blood pressure checked by school nurse Stressful day does not help   Feeling good   Pulse Readings from Last 3 Encounters:  04/12/23 87  04/11/22 71  02/04/22 76   Eats health  Prepares food at home  Lots of produce   Mother recently dx HTN Father HTN Some aunts also     Patient Active Problem List   Diagnosis Date Noted   Elevated BP without diagnosis of hypertension 04/12/2023   Insomnia 03/11/2022   Change in stool caliber    Intertrigo 12/22/2021   Lipid screening 08/30/2021   Diabetes mellitus screening 08/30/2021   Mixed incontinence 06/30/2021   Overactive bladder 06/30/2021   Tongue lesion 10/26/2020   ADD (attention deficit disorder) 07/30/2020   Social anxiety  disorder 07/30/2020   Back pain 03/20/2020   Attention and concentration deficit 12/28/2019   Allergic rhinitis 12/11/2019   Vitamin D deficiency 09/16/2019   Mild reactive airways disease 03/20/2019   Hot flashes 02/20/2019   Right knee pain 10/17/2018   Benign paroxysmal positional vertigo 05/16/2018   Lipoma of back 10/04/2017   Routine general medical examination at a health care facility 08/29/2017   Fibrocystic breast changes 01/08/2016   Hemorrhoids, external 09/30/2014   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HCC) 07/04/2014   Hemorrhoids, internal, with bleeding 04/22/2014   Family history of pulmonary embolism 09/02/2013   Family history of diabetes mellitus 09/02/2013   Cutaneous skin tags 05/15/2013   Sclerosis, ilium, 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   Vaginal discharge 12/20/2010   Rectal pain 10/14/2009   Class 2 obesity due to excess calories without serious comorbidity with body mass index (BMI) of 37.0 to 37.9 in adult 03/31/2008   HEARING LOSS, MILD 03/31/2008   MIGRAINE HEADACHE 02/20/2007   IBS 02/20/2007   OSTEOARTHRITIS 02/20/2007   Past Medical History:  Diagnosis Date   ADHD    Anemia    B12 deficiency    Back pain    Colon polyps    Coronary artery abnormality    spasms    Coronary artery spasm [facility])    reports saw cardiologist for chest pains , was told she  was having coronary artery spasms , sent for cardio w/u with stress and echo  both unremarkable. reports today has not had any spasms or chest pains in over 2 years     Fibroids    Gallstones    Irritable bowel syndrome    Joint pain    Lactose intolerance    Migraine    hasnt had one in a long time   Obesity, unspecified    PONV (postoperative nausea and vomiting)    after hysterectomy   Vitamin D deficiency    Past Surgical History:  Procedure Laterality Date   BOTOX INJECTION N/A 02/04/2022   Procedure: BOTOX INJECTION;  Surgeon: Leafy Ro, MD;  Location: [facility];  Service: General;  Laterality: N/A;    CHOLECYSTECTOMY  05/06/2011   COLONOSCOPY  11/2001   polyp; negative pathology   COLONOSCOPY  09/2004   Negative   COLONOSCOPY  2017 or 2018 patient  unsure   [facility] ;    COLONOSCOPY W/ POLYPECTOMY     COLONOSCOPY WITH PROPOFOL N/A 01/27/2022   Procedure: COLONOSCOPY WITH PROPOFOL;  Surgeon: Toney Reil, MD;  Location: [facility];  Service: Endoscopy;  Laterality: N/A;   CYSTOSCOPY N/A 01/03/2018   Procedure: CYSTOSCOPY;  Surgeon: Carrington Clamp, MD;  [facility]: [facility];  Service: Gynecology;  Laterality: N/A;   ESOPHAGOGASTRODUODENOSCOPY  08/2008   normal   FLEXIBLE SIGMOIDOSCOPY  03/05/2012   Procedure: FLEXIBLE SIGMOIDOSCOPY;  Surgeon: Louis Meckel, MD;  Location: [facility];  Service: Endoscopy;  Laterality: N/A;   LAPAROSCOPIC DECORTICATION / DUBULKING / ABLATION RENAL CYSTS     saline histogram   ROBOTIC ASSISTED LAPAROSCOPIC HYSTERECTOMY AND SALPINGECTOMY Bilateral 01/03/2018   Procedure: XI ROBOTIC ASSISTED LAPAROSCOPIC HYSTERECTOMY AND SALPINGECTOMY;  Surgeon: Carrington Clamp, MD;  Location: [facility];  Service: Gynecology;  Laterality: Bilateral;  WITH BED AFTER   TONSILLECTOMY  07/30/2007   Uterine US  09/2005   Large endometrial stripe; small ovarian cyst   WISDOM TOOTH EXTRACTION     Social History   Tobacco Use   Smoking status: Never   Smokeless tobacco: Never  Vaping Use   Vaping status: Never Used  Substance Use Topics   Alcohol use: Yes    Comment: rare   Drug use: No   Family History  Problem Relation Age of Onset   Heart disease Father    Hypertension Father    Heart attack Father    Diabetes Father    Sleep apnea Mother    Obesity Mother    Ulcers Brother    Clotting disorder Brother    Ovarian cancer Maternal Grandmother    Colon polyps Maternal Grandmother    Colon cancer Maternal Grandmother        40's   Diabetes Paternal Grandmother    Hypertension Paternal Grandmother    Breast cancer Maternal Aunt        Age  83's   Diabetes Paternal Aunt        x 5   Lung cancer Maternal Grandfather        SMOKER   Allergies  Allergen Reactions   Prevacid [Lansoprazole] Rash   Current Outpatient Medications on File Prior to Visit  Medication Sig Dispense Refill   ketoconazole (NIZORAL) 2 % cream Apply 1 application. topically daily. Apply to affected areas (rash) once daily after bathing and drying well (Patient taking differently: Apply 1 application  topically daily as needed. Apply to affected areas (rash) once daily after bathing  and drying well) 30 g 1   polyethylene glycol powder (GLYCOLAX/MIRALAX) 17 GM/SCOOP powder Take 17 g by mouth daily. 3350 g 11   traZODone (DESYREL) 50 MG tablet Take 0.5-1 tablets (25-50 mg total) by mouth at bedtime as needed for sleep. 30 tablet 3   triamcinolone cream (KENALOG) 0.1 % Apply 1 application. topically 2 (two) times daily as needed (for Eczema). 30 g 3   Current Facility-Administered Medications on File Prior to Visit  Medication Dose Route Frequency Provider Last Rate Last Admin   botulinum toxin Type A (BOTOX) injection 100 Units  100 Units Intramuscular Once Pabon, Diego F, MD       Review of Systems  Constitutional:  Negative for chills, fever and malaise/fatigue.  HENT:  Negative for congestion, ear pain, sinus pain and sore throat.   Eyes:  Negative for blurred vision, discharge and redness.  Respiratory:  Negative for cough, shortness of breath and stridor.   Cardiovascular:  Negative for chest pain, palpitations and leg swelling.  Gastrointestinal:  Negative for abdominal pain, diarrhea, nausea and vomiting.  Musculoskeletal:  Negative for myalgias.  Skin:  Negative for rash.  Neurological:  Negative for dizziness and headaches.  Psychiatric/Behavioral:  Negative for depression. The patient is not nervous/anxious.        Stressors noted     Observations/Objective:  Patient appears well, in no distress Weight is baseline  No facial swelling or  asymmetry Normal voice-not hoarse and no slurred speech No obvious tremor or mobility impairment Moving neck and UEs normally Able to hear the call well  No cough or shortness of breath during interview  Talkative and mentally sharp with no cognitive changes No skin changes on face or neck , no rash or pallor Affect is normal   Assessment and Plan: Problem List Items Addressed This Visit       Other   ADD (attention deficit disorder) - Primary    Doing well with adderall 30 mg bid (some days every day, takes 2nd dose as needed) 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  Blood pressure was up today but she was running around   Plan to refill this  Next time she is in town will schedule visit for [place] to examine her and check vitals  Continues great self care incl exercise as well      Class 2 obesity due to excess calories without serious comorbidity with body mass index (BMI) of 37.0 to 37.9 in adult    Insurance did not continue to cover GLP-1 so off of semaglutide now  Still working hard on diet and exercise  Commended  Weight is down to 211  (another 10 lb)  Discussed how this problem influences overall health and the risks it imposes  Reviewed plan for weight loss with lower calorie diet (via better food choices (lower glycemic and portion control) along with exercise building up to or more than 30 minutes 5 days per week including some aerobic activity and strength training         Relevant Medications   amphetamine-dextroamphetamine (ADDERALL) 30 MG tablet   Elevated BP without diagnosis of hypertension    Today  BP: (!) 133/90   Will schedule follow up here for this  Was running around/ stressful day Has strong fam history of HTN so we need to follow   In past no problems with adderall causing increase blood pressure or pulse   Continues good diet/ low in processed  foods and good exercise habits         Follow Up Instructions: Continue current  medicines Follow up in office for visit when able so we can check your blood pressure    Keep up the good work with healthy diet and exercise    I discussed the assessment and treatment plan with the patient. The patient was provided an opportunity to ask questions and all were answered. The patient agreed with the plan and demonstrated an understanding of the instructions.   The patient was advised to call back or seek an in-person evaluation if the symptoms worsen or if the condition fails to improve as anticipated.     Roxy Manns, MD

## 2023-04-12 NOTE — Assessment & Plan Note (Signed)
Insurance did not continue to cover GLP-1 so off of semaglutide now  Still working hard on diet and exercise  Commended  Weight is down to 211  (another 10 lb)  Discussed how this problem influences overall health and the risks it imposes  Reviewed plan for weight loss with lower calorie diet (via better food choices (lower glycemic and portion control) along with exercise building up to or more than 30 minutes 5 days per week including some aerobic activity and strength training

## 2023-04-12 NOTE — Patient Instructions (Signed)
Continue current medicines Follow up in office for visit when able so we can check your blood pressure    Keep up the good work with healthy diet and exercise

## 2023-04-12 NOTE — Assessment & Plan Note (Signed)
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

## 2023-05-19 ENCOUNTER — Ambulatory Visit: Payer: Self-pay | Admitting: Family Medicine

## 2023-05-19 ENCOUNTER — Other Ambulatory Visit: Payer: Self-pay | Admitting: Family Medicine

## 2023-05-19 MED ORDER — AMPHETAMINE-DEXTROAMPHETAMINE 30 MG PO TABS
30.0000 mg | ORAL_TABLET | Freq: Two times a day (BID) | ORAL | 0 refills | Status: DC
Start: 2023-05-19 — End: 2023-06-22

## 2023-05-19 NOTE — Telephone Encounter (Signed)
Last OV: 04/08/2023 Pending OV: Nothing scheduled at this time Medication Adderall 30mg  Directions: Take 1 tablet BID Last Refill: 04/12/2023 Qty: #60 with 0 refills

## 2023-06-22 ENCOUNTER — Other Ambulatory Visit: Payer: Self-pay | Admitting: Family Medicine

## 2023-06-22 ENCOUNTER — Encounter: Payer: Self-pay | Admitting: Family Medicine

## 2023-06-22 MED ORDER — AMPHETAMINE-DEXTROAMPHETAMINE 30 MG PO TABS
30.0000 mg | ORAL_TABLET | Freq: Two times a day (BID) | ORAL | 0 refills | Status: DC
Start: 2023-06-22 — End: 2023-07-06

## 2023-06-22 NOTE — Telephone Encounter (Signed)
LAST APPOINTMENT DATE: 04/12/23 You asked patient to come in office when she could but she will not be in town until thanksgiving. She will have the school nurse check her readings and send my chart message back.    NEXT APPOINTMENT DATE: Visit date not found    LAST REFILL: 05/19/23  QTY: #60 no rf

## 2023-07-03 ENCOUNTER — Other Ambulatory Visit: Payer: Self-pay | Admitting: Family Medicine

## 2023-07-06 MED ORDER — AMPHETAMINE-DEXTROAMPHETAMINE 10 MG PO TABS: 30.0000 mg | ORAL_TABLET | Freq: Two times a day (BID) | ORAL | 0 refills | Status: DC

## 2023-07-12 ENCOUNTER — Other Ambulatory Visit (HOSPITAL_COMMUNITY): Payer: Self-pay

## 2023-07-12 ENCOUNTER — Telehealth: Payer: Self-pay

## 2023-07-12 MED ORDER — AMPHETAMINE-DEXTROAMPHETAMINE 10 MG PO TABS
30.0000 mg | ORAL_TABLET | Freq: Two times a day (BID) | ORAL | 0 refills | Status: DC
Start: 2023-07-12 — End: 2023-08-23

## 2023-07-12 NOTE — Telephone Encounter (Signed)
Pharmacy Patient Advocate Encounter   Received notification from Patient Advice Request messages that prior authorization for Amphetamine-Dextroamphetamine 10MG  tablets is required/requested.   Insurance verification completed.   The patient is insured through [employer] .   Per test claim: PA required; PA submitted to above mentioned insurance via CoverMyMeds Key/confirmation #/EOC [id number] Status is pending

## 2023-07-12 NOTE — Telephone Encounter (Signed)
Will route to PA dpt

## 2023-07-12 NOTE — Addendum Note (Signed)
Addended by: Shon Millet on: 07/12/2023 09:12 AM   Modules accepted: Orders

## 2023-07-12 NOTE — Telephone Encounter (Signed)
Pharmacy Patient Advocate Encounter  Received notification from [facility] that Prior Authorization for Amphetamine-Dextroamphetamine 10MG  tablets  has been APPROVED from 11.27.24 to 11.27.25. Ran test claim,& The Rx was filled on today 07/12/23.  This test claim was processed through [facility]- copay amounts may vary at other pharmacies due to pharmacy/plan contracts, or as the patient moves through the different stages of their insurance plan.

## 2023-07-12 NOTE — Addendum Note (Signed)
Addended by: Roxy Manns A on: 07/12/2023 10:17 AM   Modules accepted: Orders

## 2023-07-12 NOTE — Telephone Encounter (Signed)
Sent mychart letting pt know  ?

## 2023-08-22 ENCOUNTER — Other Ambulatory Visit: Payer: Self-pay | Admitting: Family Medicine

## 2023-08-22 ENCOUNTER — Encounter: Payer: Self-pay | Admitting: Family Medicine

## 2023-08-22 NOTE — Telephone Encounter (Signed)
 Last filled on 07/12/23 # 180 tabs/ 0 refills   Last OV was a virtual visit on 04/11/23 to discuss meds   See message pt wants a 30 mg Rx

## 2023-08-22 NOTE — Telephone Encounter (Signed)
 Before I proceed am I allowed to send controlled med to another state?

## 2023-08-23 MED ORDER — AMPHETAMINE-DEXTROAMPHETAMINE 30 MG PO TABS: 30.0000 mg | ORAL_TABLET | Freq: Two times a day (BID) | ORAL | 0 refills | Status: DC

## 2023-08-23 NOTE — Telephone Encounter (Signed)
 Per Joellen yes

## 2023-08-23 NOTE — Telephone Encounter (Signed)
 Duplicate request

## 2023-09-05 MED ORDER — AMPHETAMINE-DEXTROAMPHETAMINE 30 MG PO TABS: 30.0000 mg | ORAL_TABLET | Freq: Two times a day (BID) | ORAL | 0 refills | Status: DC

## 2023-09-05 NOTE — Addendum Note (Signed)
Addended by: Roxy Manns A on: 09/05/2023 10:18 AM   Modules accepted: Orders

## 2023-11-24 ENCOUNTER — Other Ambulatory Visit: Payer: Self-pay | Admitting: Family Medicine

## 2023-11-27 MED ORDER — AMPHETAMINE-DEXTROAMPHETAMINE 30 MG PO TABS
30.0000 mg | ORAL_TABLET | Freq: Two times a day (BID) | ORAL | 0 refills | Status: DC
Start: 2023-11-27 — End: 2024-01-18

## 2023-11-27 NOTE — Telephone Encounter (Signed)
 Name of Medication: Adderall Name of Pharmacy: [facility] in [place] Last [place] or Written Date and Quantity: 09/05/23 #60 tabs/ 0 refills  Last Office Visit and Type: virtual visit on 04/11/24 Next Office Visit and Type: none scheduled (cancelled her in office appt on 05/19/23)

## 2023-11-27 NOTE — Telephone Encounter (Signed)
 Please schedule in office visit when able this spring/summer

## 2023-11-27 NOTE — Telephone Encounter (Signed)
 Called  pt and schedule a appt for f/u

## 2024-01-17 ENCOUNTER — Encounter: Payer: Self-pay | Admitting: Family Medicine

## 2024-01-18 MED ORDER — AMPHETAMINE-DEXTROAMPHETAMINE 30 MG PO TABS: 30.0000 mg | ORAL_TABLET | Freq: Two times a day (BID) | ORAL | 0 refills | Status: AC

## 2024-01-29 ENCOUNTER — Ambulatory Visit: Payer: Self-pay | Admitting: Family Medicine

## 2024-02-22 ENCOUNTER — Encounter: Payer: Self-pay | Admitting: Family Medicine

## 2024-02-23 ENCOUNTER — Telehealth: Payer: Self-pay | Admitting: *Deleted

## 2024-02-23 NOTE — Telephone Encounter (Signed)
Sent mychart letting pt know Dr. Tower's comments. 

## 2024-02-23 NOTE — Telephone Encounter (Signed)
 Pt sent a message saying:  Dear Dr. Randeen,   I wanted to share the results of some recent labs I had done through [facility]. A few of the values were out of range, and I would appreciate the opportunity to discuss them with you in more detail.   The areas flagged were:   Mean Corpuscular Hemoglobin Concentration (MCHC): 31.2 g/dL   LDL Cholesterol: 886 mg/dL   Apolipoprotein B (ApoB): 92 mg/dL   I've attached or can upload the full lab report for your review. My next appointment is currently scheduled for the 18th, but if it's possible to schedule a virtual appointment sooner to go over these results and any necessary next steps, I would be very grateful.   Please let me know if there's a convenient time that works for you or your team.   ** labs are under media tab**

## 2024-02-23 NOTE — Telephone Encounter (Signed)
 I am out of the office/out of town-so sorry   None of those labs look  worrisome- I think we can wait until the 18th and discuss in detail   Watch diet for fats and cholesterol  Not worried about MCHC and doubt it needs much-will discuss further at visit

## 2024-03-01 ENCOUNTER — Ambulatory Visit: Payer: Self-pay | Admitting: Family Medicine
